# Patient Record
Sex: Male | Born: 1945 | Race: White | Hispanic: No | Marital: Married | Smoking: Former smoker
Health system: Southern US, Community
[De-identification: ages and names within clinical notes are randomized; demographics above are authoritative.]

## PROBLEM LIST (undated history)

## (undated) DIAGNOSIS — Z8709 Personal history of other diseases of the respiratory system: Secondary | ICD-10-CM

## (undated) DIAGNOSIS — K219 Gastro-esophageal reflux disease without esophagitis: Secondary | ICD-10-CM

## (undated) DIAGNOSIS — G4730 Sleep apnea, unspecified: Secondary | ICD-10-CM

## (undated) DIAGNOSIS — E782 Mixed hyperlipidemia: Secondary | ICD-10-CM

## (undated) DIAGNOSIS — IMO0001 Reserved for inherently not codable concepts without codable children: Secondary | ICD-10-CM

## (undated) DIAGNOSIS — I10 Essential (primary) hypertension: Secondary | ICD-10-CM

## (undated) DIAGNOSIS — C801 Malignant (primary) neoplasm, unspecified: Secondary | ICD-10-CM

## (undated) DIAGNOSIS — J449 Chronic obstructive pulmonary disease, unspecified: Secondary | ICD-10-CM

## (undated) DIAGNOSIS — T7840XA Allergy, unspecified, initial encounter: Secondary | ICD-10-CM

## (undated) DIAGNOSIS — J069 Acute upper respiratory infection, unspecified: Secondary | ICD-10-CM

## (undated) DIAGNOSIS — M1990 Unspecified osteoarthritis, unspecified site: Secondary | ICD-10-CM

## (undated) DIAGNOSIS — R351 Nocturia: Secondary | ICD-10-CM

## (undated) DIAGNOSIS — M4800 Spinal stenosis, site unspecified: Secondary | ICD-10-CM

## (undated) HISTORY — DX: Allergy, unspecified, initial encounter: T78.40XA

## (undated) HISTORY — PX: CIRCUMCISION: SUR203

## (undated) HISTORY — PX: SEPTOPLASTY: SUR1290

## (undated) HISTORY — DX: Acute upper respiratory infection, unspecified: J06.9

## (undated) HISTORY — PX: SPINE SURGERY: SHX786

## (undated) HISTORY — DX: Mixed hyperlipidemia: E78.2

## (undated) HISTORY — DX: Gastro-esophageal reflux disease without esophagitis: K21.9

---

## 2002-04-05 ENCOUNTER — Emergency Department (HOSPITAL_COMMUNITY): Admission: EM | Admit: 2002-04-05 | Discharge: 2002-04-05 | Payer: Self-pay | Admitting: Internal Medicine

## 2002-11-09 HISTORY — PX: KNEE ARTHROSCOPY: SUR90

## 2002-11-17 ENCOUNTER — Encounter: Payer: Self-pay | Admitting: Family Medicine

## 2002-11-17 ENCOUNTER — Ambulatory Visit (HOSPITAL_COMMUNITY): Admission: RE | Admit: 2002-11-17 | Discharge: 2002-11-17 | Payer: Self-pay | Admitting: Family Medicine

## 2002-12-01 ENCOUNTER — Ambulatory Visit (HOSPITAL_COMMUNITY): Admission: RE | Admit: 2002-12-01 | Discharge: 2002-12-01 | Payer: Self-pay | Admitting: Family Medicine

## 2002-12-01 ENCOUNTER — Encounter: Payer: Self-pay | Admitting: Family Medicine

## 2002-12-18 ENCOUNTER — Encounter: Payer: Self-pay | Admitting: Otolaryngology

## 2002-12-18 ENCOUNTER — Encounter: Admission: RE | Admit: 2002-12-18 | Discharge: 2002-12-18 | Payer: Self-pay | Admitting: Otolaryngology

## 2003-01-15 ENCOUNTER — Encounter: Payer: Self-pay | Admitting: Otolaryngology

## 2003-01-15 ENCOUNTER — Ambulatory Visit (HOSPITAL_COMMUNITY): Admission: RE | Admit: 2003-01-15 | Discharge: 2003-01-15 | Payer: Self-pay | Admitting: Otolaryngology

## 2003-01-17 ENCOUNTER — Ambulatory Visit (HOSPITAL_COMMUNITY): Admission: RE | Admit: 2003-01-17 | Discharge: 2003-01-17 | Payer: Self-pay | Admitting: Orthopedic Surgery

## 2003-02-22 ENCOUNTER — Ambulatory Visit (HOSPITAL_COMMUNITY): Admission: RE | Admit: 2003-02-22 | Discharge: 2003-02-22 | Payer: Self-pay | Admitting: Otolaryngology

## 2003-02-22 ENCOUNTER — Encounter: Payer: Self-pay | Admitting: Otolaryngology

## 2003-04-10 ENCOUNTER — Encounter: Admission: RE | Admit: 2003-04-10 | Discharge: 2003-04-10 | Payer: Self-pay | Admitting: Otolaryngology

## 2003-04-10 ENCOUNTER — Encounter: Payer: Self-pay | Admitting: Otolaryngology

## 2003-04-16 ENCOUNTER — Encounter (INDEPENDENT_AMBULATORY_CARE_PROVIDER_SITE_OTHER): Payer: Self-pay | Admitting: *Deleted

## 2003-04-16 ENCOUNTER — Ambulatory Visit (HOSPITAL_BASED_OUTPATIENT_CLINIC_OR_DEPARTMENT_OTHER): Admission: RE | Admit: 2003-04-16 | Discharge: 2003-04-16 | Payer: Self-pay | Admitting: Otolaryngology

## 2003-05-09 ENCOUNTER — Ambulatory Visit (HOSPITAL_COMMUNITY): Admission: RE | Admit: 2003-05-09 | Discharge: 2003-05-09 | Payer: Self-pay | Admitting: Family Medicine

## 2003-05-09 ENCOUNTER — Encounter: Payer: Self-pay | Admitting: Family Medicine

## 2004-03-04 ENCOUNTER — Ambulatory Visit (HOSPITAL_COMMUNITY): Admission: RE | Admit: 2004-03-04 | Discharge: 2004-03-04 | Payer: Self-pay | Admitting: Internal Medicine

## 2004-03-04 HISTORY — PX: COLONOSCOPY: SHX174

## 2005-05-18 ENCOUNTER — Emergency Department (HOSPITAL_COMMUNITY): Admission: EM | Admit: 2005-05-18 | Discharge: 2005-05-19 | Payer: Self-pay | Admitting: Emergency Medicine

## 2005-05-20 ENCOUNTER — Emergency Department (HOSPITAL_COMMUNITY): Admission: EM | Admit: 2005-05-20 | Discharge: 2005-05-20 | Payer: Self-pay | Admitting: Emergency Medicine

## 2007-01-18 ENCOUNTER — Ambulatory Visit (HOSPITAL_COMMUNITY): Admission: RE | Admit: 2007-01-18 | Discharge: 2007-01-18 | Payer: Self-pay | Admitting: Family Medicine

## 2008-03-13 ENCOUNTER — Ambulatory Visit (HOSPITAL_COMMUNITY): Admission: RE | Admit: 2008-03-13 | Discharge: 2008-03-13 | Payer: Self-pay | Admitting: Family Medicine

## 2009-05-07 ENCOUNTER — Encounter: Payer: Self-pay | Admitting: Internal Medicine

## 2009-05-22 ENCOUNTER — Ambulatory Visit: Payer: Self-pay | Admitting: Internal Medicine

## 2009-05-22 ENCOUNTER — Ambulatory Visit (HOSPITAL_COMMUNITY): Admission: RE | Admit: 2009-05-22 | Discharge: 2009-05-22 | Payer: Self-pay | Admitting: Internal Medicine

## 2009-05-22 HISTORY — PX: COLONOSCOPY: SHX174

## 2009-11-20 ENCOUNTER — Ambulatory Visit (HOSPITAL_COMMUNITY)
Admission: RE | Admit: 2009-11-20 | Discharge: 2009-11-20 | Payer: Self-pay | Source: Home / Self Care | Admitting: Family Medicine

## 2009-11-29 ENCOUNTER — Ambulatory Visit (HOSPITAL_COMMUNITY): Admission: RE | Admit: 2009-11-29 | Discharge: 2009-11-29 | Payer: Self-pay | Admitting: Family Medicine

## 2009-12-03 ENCOUNTER — Ambulatory Visit (HOSPITAL_COMMUNITY): Admission: RE | Admit: 2009-12-03 | Discharge: 2009-12-03 | Payer: Self-pay | Admitting: Family Medicine

## 2011-01-25 LAB — BLOOD GAS, ARTERIAL
Patient temperature: 37
pH, Arterial: 7.43 (ref 7.350–7.450)
pO2, Arterial: 78.1 mmHg — ABNORMAL LOW (ref 80.0–100.0)

## 2011-03-24 NOTE — Op Note (Signed)
NAME:  Miguel Morales, Miguel Morales                ACCOUNT NO.:  [id number]   MEDICAL RECORD NO.:  [id number]          PATIENT TYPE:  AMB   LOCATION:  DAY                           FACILITY:  [facility]   PHYSICIAN:  R. Roetta Sessions, M.D. DATE OF BIRTH:  Mar 09, 1946   DATE OF PROCEDURE:  05/22/2009  DATE OF DISCHARGE:                               OPERATIVE REPORT   PROCEDURE PERFORMED:  Screening colonoscopy.   INDICATIONS FOR PROCEDURE:  A 65 year old gentleman with a prior history  of colonic adenomas.  His last colonoscopy was 5 years ago.  He was  found to have only left sided diverticula disease at that time.  He has  a history of significant bradycardia during his colonoscopy (session  before last).  He was given prophylactic atropine at his last  colonoscopy and did well and he will receive atropine today prior to  initiation of the procedure.  This approach has been discussed with Mr.  Buffa at length.  Risks, benefits, alternatives, limitations have been  reviewed.  His questions were answered.   PROCEDURE NOTE:  The patient was placed in the left lateral decubitus  position.  O2 saturation, blood pressure, pulse and respirations were  monitored throughout the entire procedure.   CONSCIOUS SEDATION:  Versed 4 mg IV, Demerol 75 mg IV in divided doses.  Atropine 0.25 mg intravenously was given prior to initiation of the  procedure to prophylax against bradycardia.   INSTRUMENT:  Pentax video chip system.   FINDINGS:  Digital rectal exam revealed no abnormalities.   ENDOSCOPIC FINDINGS:  Prep was adequate.  Colon:  Colonic mucosa was  surveyed from the rectosigmoid junction through the left, transverse and  right colon to the appendiceal orifice, ileocecal valve and cecum.  These structures were well seen and photographed for the record.  From  this level the scope was slowly withdrawn.  All previously mentioned  mucosal surfaces were again seen.  Again the patient had scattered left-  sided diverticula.  There were scattered submucosal petechiae around the  opening of some of the sigmoid diverticulum.  The remainder of the  colonic mucosa appeared entirely normal.  Scope was pulled down the  rectum.  Examination of the rectal mucosa including retroflexed view of  the anal verge demonstrated no abnormalities.  The patient tolerated the  procedure very well.  Cecal withdrawal time 9 minutes.   IMPRESSION:  1. Normal rectum.  2. Left-sided diverticula.  3. Colonic mucosa appeared normal.   RECOMMENDATIONS:  Repeat colonoscopy 5 years.      Jonathon Bellows, M.D.  Electronically Signed     RMR/MEDQ  D:  05/22/2009  T:  05/22/2009  Job:  [id number]   cc:   Lorin Picket A. Gerda Diss, MD  Fax: [phone]

## 2011-03-27 NOTE — Op Note (Signed)
NAME:  Miguel Morales, Miguel Morales                          ACCOUNT NO.:  [id number]   MEDICAL RECORD NO.:  [id number]                   PATIENT TYPE:  AMB   LOCATION:  DAY                                  FACILITY:  [facility]   PHYSICIAN:  R. Roetta Sessions, M.D.              DATE OF BIRTH:  Nov 15, 1945   DATE OF PROCEDURE:  03/04/2004  DATE OF DISCHARGE:                                 OPERATIVE REPORT   PROCEDURE:  Surveillance colonoscopy.   INDICATIONS FOR PROCEDURE:  The patient is a 65 year old gentleman who had  multiple adenomas removed from his left colon three years ago.  He has done  well from a GI standpoint, and he is here for surveillance.  This approach  has been discussed with the patient at length.  The potential risks,  benefits, and alternatives have been reviewed and questions answered.  Please see the documentation in the medical record for more information.   PROCEDURE:  O2 saturation, blood pressure, pulses, and respirations were  monitored throughout the entirety of the procedure.  Conscious sedation was  with Versed 4 mg IV, Demerol 75 mg IV in divided doses, atropine 0.25 mg IV  prior to the procedure given his history of bradycardia with a colonoscopy  in 2002.  The instrument used was the Olympus video chip system.   FINDINGS:  Digital rectal examination revealed no abnormalities.   ENDOSCOPIC FINDINGS:  The prep was good.   Rectum:  Examination of the rectal mucosa including retroflex view of the  anal verge revealed no abnormalities.   Colon:  The colonic mucosa was surveyed from the rectosigmoid junction  through the left, transverse, right colon to the area of the appendiceal  orifice, ileocecal valve, and cecum.  These structures were well-seen and  photographed for the record.  From this level, the scope was slowly  withdrawn.  All previously mentioned mucosal surfaces were again seen.  The  patient was noted to have left-sided diverticula.  There was no evidence  of  recurrent polyps or other abnormalities.  The patient tolerated the  procedure well and was reactive in endoscopy.   IMPRESSION:  1. Normal rectum.  2. Left-sided diverticula.  The remainder of the colonic mucosa appeared     normal.   RECOMMENDATIONS:  Repeat colonoscopy in five years.      ___________________________________________                                            Jonathon Bellows, M.D.   RMR/MEDQ  D:  03/04/2004  T:  03/04/2004  Job:  [id number]

## 2011-03-27 NOTE — Op Note (Signed)
NAME:  Miguel Morales, Miguel Morales NO.:  [id number]   MEDICAL RECORD NO.:  [id number]                   PATIENT TYPE:  AMB   LOCATION:  DSC                                  FACILITY:  [facility]   PHYSICIAN:  Karol T. Lazarus Salines, M.D.              DATE OF BIRTH:  Aug 02, 1946   DATE OF PROCEDURE:  04/16/2003  DATE OF DISCHARGE:                                 OPERATIVE REPORT   PREOPERATIVE DIAGNOSES:  1. Chronic polypoid pansinusitis.  2. Deviated nasal septum.  3. Hypertrophic inferior turbinates.   POSTOPERATIVE DIAGNOSES:  1. Chronic polypoid pansinusitis.  2. Deviated nasal septum.  3. Hypertrophic inferior turbinates.   PROCEDURES:  1. Bilateral complete endoscopic ethmoidectomy.  2. Bilateral endoscopic antrostomy.  3. Bilateral endoscopic sphenoidotomy.  4. Nasoseptoplasty.  5. Bilateral submucous resection of inferior turbinates.   SURGEON:  Gloris Manchester. Lazarus Salines, M.D.   ANESTHESIA:  General orotracheal.   BLOOD LOSS:  100 mL.   COMPLICATIONS:  None.   FINDINGS:  Polypoid mucosal changes throughout the middle meatus and sinus  cells on all sides.  An S-shaped septal deformity with a high rightward  deviation and a low leftward deviation.  Hypertrophic inferior turbinates.  No active infection.  No evidence of violation of the fovea ethmoidalis or  the lamina papyracea.   PROCEDURE:  With the patient in the comfortable supine position, general  orotracheal anesthesia was induced without difficulty.  At an appropriate  level, the patient was placed in a slight sitting position.  A saline  moistened throat pack was placed.  Nasal vibrissae were trimmed.  Cocaine  crystals, 200 mg total, were applied on cotton carriers to the anterior  ethmoid and sphenopalatine ganglion regions on both sides.  Xylocaine with  phenylephrine solution was applied on 1/2 x 3 inch Cottonoids to both sides  of the septum.  One percent Xylocaine with 1:100,000 epinephrine, 12  mL  total, was infiltrated into the submucoperichondrial plate into the septum,  into the membranous columella, into the anterior pole of the inferior  turbinates, and into the anterior floor of the nose on both sides.  Several  minutes were allowed for this to take effect.  A sterile preparation and  draping of the mid face was accomplished.   The materials were removed from the nose and observed to be intact and  correct in number.  Additional 1% Xylocaine with 1:100,000 epinephrine on a  spinal needle was infiltrated into the middle turbinate and the lateral wall  of the nose of both sides.  Additional Xylocaine with phenylephrine solution  was applied on Cottonoids up into the middle meatus and between the middle  turbinate and the septum on both sides.  Several additional minutes were  allowed for this to take effect.   The septoplasty was to be performed first.  A left-sided hemitransfixion  incision was sharply executed and carried down to  the caudal edge of the  quadrangular cartilage, which was actually presenting into the right nasal  vestibule.  A small floor incision was executed on both sides and floor  tunnels were elevated, carried back to the vomer, and brought forward.  The  submucoperichondrial plate of the left septum was elevated up to the dorsum  of the nose, back onto the perpendicular plate, brought down and  communicated with the floor tunnel, and brought forward.  The left septal  flap was elevated intact.  The chondroethmoid junction was identified and  opened with the caudal elevator.  The opposite submucoperiosteal plane was  elevated.  The superior perpendicular plate of the ethmoid was lysed with  open Jensen-Middleton forceps to avoid rocking in the cribriform plate  region.  The perpendicular plate was further dissected and rocked free with  closed Jensen-Middleton forceps and delivered.  Additional cartilage tag of  the quadrangular cartilage out along  the vomer was dissected and removed.  A  portion of the posterior vomer, which was deflected rightward was removed.  The posterior inferior corner of the quadrangular cartilage and a 2 mm strip  of the inferior quadrangular cartilage was submucosally resected.  This  allowed the cartilage to trap door into the midline freely.  There was still  a caudal deflection into the right side.  A vertical incision was made along  the deviation and a 0.5 mm wide strip of cartilage was removed to allow the  caudal quadrangular cartilage to hinge.  The hinge was controlled with  interrupted 4-0 chromic sutures in the standard fascia.  The pocket was  generated between the medial curiae of the lower lateral cartilage.  Working  through the external columellar skin, a 4-0 chromic stitch was placed into  the pocket through the septum sagittally and back out of the pocket and  secured at the external columella, tucking the caudal edge of the  quadrangular cartilage into the interior cural pocket.  At this point, the  septum was stable in the midline.  Hemostasis was observed.  The septal  tunnel was suctioned free.  A long posterior left drainage incision was  executed.  The incisions were closed with an interrupted 4-0 chromic suture.   After completing the septoplasty, the right sinus cavity was approached  first.  The Cottonoids were removed and observed to be intact and correct in  number.  The 0 degree endoscope was used to inspect the nose.  There were  polyps in the middle meatus and crowding in the sphenoethmoidal recess.  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  The conchal cell was entered with a sickle knife in the  sagittal plane and the lateral mucosa and bony wall removed with the power  debrider.  The uncinate process was lysed with a sickle knife and removed of  the debrider.  Using the punch forceps to identify the bulla, the bulla  was opened and bullar partitions were removed with the power debrider.  The  vertical lamella of the middle turbinate was punctured.  The posterior  ethmoid cells were entered.  Using suction tip for probing using a straight  punch to identify cells and then using the power debrider to remove polyps,  mucosa, and bone spicules, the ethmoid block was developed.  Early on, the  natural antrostomy was identified and enlarged using the forward and back  biting forceps.  This allowed accurate orientation to the  lamina papyracea,  which was not disturbed.  The lamina papyracea was carried posteriorly.  Working through the nose, a suction tip was placed into the sphenoid ostium  and the sphenoid was opened with the Hajek forceps.  The posterior ethmoid  cells were developed and finally communicated with the sphenoid sinus.  Partitions were carefully removed as was the superior turbinate.  There was  no evidence of violation of the fovea ethmoidalis or the lamina papyracea.  The debrider was switched to an angle tip and the antrostomy was cleaned up.  Using the 30 degree endoscope, agger nasi cells were debrided using punch  forceps and the debrider.  The frontal recess was identified, but no further  disturbed.  At this point, hemostasis was observed.  A Xylocaine with  phenylephrine moistened cottonoid was placed into the middle meatus.   Attention was turned to the left side where the procedure was performed in  essential identical fashion, removing the conchal cell, removing the  anterior and posterior ethmoid cells, communicating the ethmoid block with  the sphenoids and taking down the partitions in between those two and  finally using the angled scope, opening up the agger nasi region and  visualizing the nasofrontal recess.  The antrostomy was large and cleaned  with the debrider.  Again hemostasis was observed.  Both middle turbinates  were slightly floppy at this point and were secured  transseptally with a 4-0  PDS suture.  A Xylocaine with phenylephrine pledget was placed in the left  side.   Returning to the right side, the endoscope was used to inspect the sinus and  no evidence of injury to inappropriate structures or residual cells was  noted.  Hemostasis was observed.  The cottonoid was not replaced.  The  cottonoid was removed from the left side and this side was inspected once  again with the same findings as the right.   At this point, the inferior turbinates were infiltrated with 1% Xylocaine  with 1:100,000 epinephrine, 6 mL total.  Several minutes were allowed for  this to take effect.  Beginning on the right side, the anterior hood of the  inferior turbinate was incised just behind the nasal valve.  The medial  mucosa was incised in an anterior upsloping fashion and a laterally based  flap was developed.  The turbinate was infractured and the turbinate bone  and lateral mucosa were removed using the angled turbinate scissors, taking virtually of the anterior pole and leaving all of the posterior pole.  Bony  spicules were removed.  The cut edges were suction coagulated.  The flap was  laid back down.  The turbinate was out fractured and this side was  completed.  The left side was done in identical fashion.   Reinforced 0.040 Silastic splints were fashion, placed against the septum,  and secured there too with a 3-0 nylon stitch.  Triple thickness Neosporin-  impregnated Telfa packs with a 2-0 silk tag were placed to the ethmoid block  on both sides.  Quadruple thickness Neosporin-impregnated Telfa packs were  placed along the inferior turbinates in the septum low in the nose on both  sides.  Hemostasis was observed.  The strings were tied external to the  columnella.  The pharynx was suctioned free and the throat pack was removed.  At this point, the procedure was completed.  The patient was returned to  anesthesia, awakened, extubated, and transferred  to recovery in stable  condition.   COMMENT:  A 65 year old white male with a chronic history of nasal  obstruction and recurrent sinus infections, as well as nasal obstruction  with the indication for several components of today's procedure.  Anticipate  a routine postoperative recovery with attention to ice, elevation,  analgesia, and antibiosis.  Will remove the septal packs in one day and the  septal splints in 10 days.  Will initiate nasal hygiene measures and observe  routinely for vision difficulties, bleeding, or signs of infection.  Given  low anticipated risk of post anesthetic or post surgical complications, I  feel an outpatient venue is appropriate.                                               Gloris Manchester. Lazarus Salines, M.D.    KTW/MEDQ  D:  04/16/2003  T:  04/16/2003  Job:  [id number]   cc:   Lorin Picket A. Gerda Diss, M.D.  [address], [address]  [place]  [place] [address]  Fax: [phone]

## 2011-11-13 DIAGNOSIS — J069 Acute upper respiratory infection, unspecified: Secondary | ICD-10-CM | POA: Diagnosis not present

## 2011-12-09 DIAGNOSIS — E782 Mixed hyperlipidemia: Secondary | ICD-10-CM | POA: Diagnosis not present

## 2011-12-09 DIAGNOSIS — Z125 Encounter for screening for malignant neoplasm of prostate: Secondary | ICD-10-CM | POA: Diagnosis not present

## 2011-12-09 DIAGNOSIS — Z79899 Other long term (current) drug therapy: Secondary | ICD-10-CM | POA: Diagnosis not present

## 2011-12-14 DIAGNOSIS — Z0000 Encounter for general adult medical examination without abnormal findings: Secondary | ICD-10-CM | POA: Diagnosis not present

## 2011-12-14 DIAGNOSIS — Z23 Encounter for immunization: Secondary | ICD-10-CM | POA: Diagnosis not present

## 2011-12-28 ENCOUNTER — Encounter (HOSPITAL_COMMUNITY): Payer: Self-pay

## 2011-12-28 ENCOUNTER — Emergency Department (HOSPITAL_COMMUNITY)
Admission: EM | Admit: 2011-12-28 | Discharge: 2011-12-28 | Disposition: A | Discharge status: Other Acute Inpatient Hospital | Payer: Medicare Other | Attending: Emergency Medicine | Admitting: Emergency Medicine

## 2011-12-28 DIAGNOSIS — Z87891 Personal history of nicotine dependence: Secondary | ICD-10-CM | POA: Diagnosis not present

## 2011-12-28 DIAGNOSIS — T310 Burns involving less than 10% of body surface: Secondary | ICD-10-CM | POA: Diagnosis not present

## 2011-12-28 DIAGNOSIS — X028XXA Other exposure to controlled fire in building or structure, initial encounter: Secondary | ICD-10-CM | POA: Insufficient documentation

## 2011-12-28 DIAGNOSIS — J449 Chronic obstructive pulmonary disease, unspecified: Secondary | ICD-10-CM | POA: Insufficient documentation

## 2011-12-28 DIAGNOSIS — T300 Burn of unspecified body region, unspecified degree: Secondary | ICD-10-CM | POA: Insufficient documentation

## 2011-12-28 DIAGNOSIS — J4489 Other specified chronic obstructive pulmonary disease: Secondary | ICD-10-CM | POA: Insufficient documentation

## 2011-12-28 DIAGNOSIS — T3110 Burns involving 10-19% of body surface with 0% to 9% third degree burns: Secondary | ICD-10-CM | POA: Insufficient documentation

## 2011-12-28 DIAGNOSIS — T2020XA Burn of second degree of head, face, and neck, unspecified site, initial encounter: Secondary | ICD-10-CM | POA: Diagnosis not present

## 2011-12-28 DIAGNOSIS — T3111 Burns involving 10-19% of body surface with 10-19% third degree burns: Secondary | ICD-10-CM | POA: Diagnosis not present

## 2011-12-28 DIAGNOSIS — T2121XA Burn of second degree of chest wall, initial encounter: Secondary | ICD-10-CM | POA: Diagnosis not present

## 2011-12-28 DIAGNOSIS — Y9229 Other specified public building as the place of occurrence of the external cause: Secondary | ICD-10-CM | POA: Insufficient documentation

## 2011-12-28 DIAGNOSIS — T2000XA Burn of unspecified degree of head, face, and neck, unspecified site, initial encounter: Secondary | ICD-10-CM

## 2011-12-28 DIAGNOSIS — T22219A Burn of second degree of unspecified forearm, initial encounter: Secondary | ICD-10-CM | POA: Diagnosis not present

## 2011-12-28 HISTORY — DX: Chronic obstructive pulmonary disease, unspecified: J44.9

## 2011-12-28 MED ORDER — LACTATED RINGERS IV SOLN
INTRAVENOUS | Status: DC
  Administered 2011-12-28: 13:00:00 via INTRAVENOUS

## 2011-12-28 NOTE — ED Notes (Signed)
Patient sitting in bed. Remains alert/oriented x4. Respirations even and unlabored.
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
## 2011-12-28 NOTE — ED Notes (Signed)
Report given to Bjorn Loser, RN at [facility] ED. Informed of [facility] ETA. They are awaiting patient arrival.

## 2011-12-28 NOTE — ED Provider Notes (Signed)
I saw and evaluated the patient, reviewed the resident's note and I agree with the findings and plan.  Delani Kohli P Haevyn Ury, MD 12/28/11 1607 

## 2011-12-28 NOTE — ED Notes (Signed)
Report given to Casimiro Needle, RN [facility]. [facility] at bedside.

## 2011-12-28 NOTE — ED Notes (Signed)
Patient transferred to [facility]. Departure destination charted incorrectly (charted patient with home destination).
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
## 2011-12-28 NOTE — ED Notes (Signed)
Patient left with [facility] at this time. NAD noted upon departure.
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
## 2012-01-04 DIAGNOSIS — M1990 Unspecified osteoarthritis, unspecified site: Secondary | ICD-10-CM | POA: Diagnosis not present

## 2012-01-04 DIAGNOSIS — L0390 Cellulitis, unspecified: Secondary | ICD-10-CM | POA: Diagnosis not present

## 2012-01-04 DIAGNOSIS — J410 Simple chronic bronchitis: Secondary | ICD-10-CM | POA: Diagnosis not present

## 2012-01-04 DIAGNOSIS — I110 Hypertensive heart disease with heart failure: Secondary | ICD-10-CM | POA: Diagnosis not present

## 2012-01-04 DIAGNOSIS — L0291 Cutaneous abscess, unspecified: Secondary | ICD-10-CM | POA: Diagnosis not present

## 2012-01-18 DIAGNOSIS — T310 Burns involving less than 10% of body surface: Secondary | ICD-10-CM | POA: Diagnosis not present

## 2012-03-22 ENCOUNTER — Other Ambulatory Visit: Payer: Self-pay | Admitting: Family Medicine

## 2012-03-22 ENCOUNTER — Ambulatory Visit (HOSPITAL_COMMUNITY)
Admission: RE | Admit: 2012-03-22 | Discharge: 2012-03-22 | Disposition: A | Discharge status: Home / Self Care | Payer: Medicare Other | Source: Ambulatory Visit | Attending: Family Medicine | Admitting: Family Medicine

## 2012-03-22 ENCOUNTER — Encounter: Payer: Self-pay | Admitting: Cardiology

## 2012-03-22 DIAGNOSIS — R05 Cough: Secondary | ICD-10-CM | POA: Diagnosis not present

## 2012-03-22 DIAGNOSIS — J4489 Other specified chronic obstructive pulmonary disease: Secondary | ICD-10-CM | POA: Insufficient documentation

## 2012-03-22 DIAGNOSIS — R0602 Shortness of breath: Secondary | ICD-10-CM | POA: Diagnosis not present

## 2012-03-22 DIAGNOSIS — J449 Chronic obstructive pulmonary disease, unspecified: Secondary | ICD-10-CM | POA: Insufficient documentation

## 2012-03-22 DIAGNOSIS — R0609 Other forms of dyspnea: Secondary | ICD-10-CM | POA: Diagnosis not present

## 2012-03-22 DIAGNOSIS — R0989 Other specified symptoms and signs involving the circulatory and respiratory systems: Secondary | ICD-10-CM | POA: Diagnosis not present

## 2012-03-23 ENCOUNTER — Ambulatory Visit (INDEPENDENT_AMBULATORY_CARE_PROVIDER_SITE_OTHER): Payer: Medicare Other | Admitting: Cardiology

## 2012-03-23 ENCOUNTER — Encounter: Payer: Self-pay | Admitting: Cardiology

## 2012-03-23 VITALS — BP 124/76 | HR 86 | Resp 16 | Ht 67.0 in | Wt 201.0 lb

## 2012-03-23 DIAGNOSIS — J449 Chronic obstructive pulmonary disease, unspecified: Secondary | ICD-10-CM

## 2012-03-23 DIAGNOSIS — R0602 Shortness of breath: Secondary | ICD-10-CM | POA: Diagnosis not present

## 2012-03-23 DIAGNOSIS — E782 Mixed hyperlipidemia: Secondary | ICD-10-CM | POA: Insufficient documentation

## 2012-03-23 NOTE — Assessment & Plan Note (Signed)
Description seems most consistent with pulmonary etiology. Hopefully he will feel better with some of the recent modification in his inhaler regimen by Dr.Luking. His ECG is nonspecific, although he does have cardiac risk factors otherwise including gender and age, hyperlipidemia. He has not had any recent ischemic assessment, and this will be obtained via exercise Myoview. If he is not able to ambulate, would change test to a dobutamine Myoview.

## 2012-03-23 NOTE — Patient Instructions (Signed)
**Note De-identified Macon Lesesne Obfuscation** Your physician recommends that you continue on your current medications as directed. Please refer to the Current Medication list given to you today.  Your physician has requested that you have en exercise stress myoview. For further information please visit [email or web address]. Please follow instruction sheet, as given.  Your physician recommends that you schedule a follow-up appointment in: we will contact you with results of test.  

## 2012-03-23 NOTE — Progress Notes (Signed)
Clinical Summary Miguel Morales is a 66 y.o.male referred for cardiology consultation by Dr. Lilyan Punt. He reports worsening in baseline shortness of breath with activity over the last 6 weeks. Indicates a dry mouth, mouth breathing when he exerts himself, also feeling of irritation in his throat with coughing spells. He does use inhalers regularly for COPD. He has had no exertional chest pain or palpitations. Reports a standard treadmill test approximately 10-15 years ago.  Recent chest x-ray from 5/14 reports COPD with no active lung disease. Fax copy of recent ECG shows sinus rhythm with nonspecific ST changes.   Allergies  Allergen Reactions  . Sulfa Antibiotics     Current Outpatient Prescriptions  Medication Sig Dispense Refill  . albuterol (PROVENTIL HFA;VENTOLIN HFA) 108 (90 BASE) MCG/ACT inhaler Inhale 2 puffs into the lungs every 6 (six) hours as needed.      . Ascorbic Acid (VITAMIN C) 1000 MG tablet Take 1,000 mg by mouth daily.      Marland Kitchen aspirin 81 MG tablet Take 81 mg by mouth daily.      . budesonide-formoterol (SYMBICORT) 160-4.5 MCG/ACT inhaler Inhale 2 puffs into the lungs 2 (two) times daily.      . Loratadine 10 MG CAPS Take by mouth daily.      . meloxicam (MOBIC) 7.5 MG tablet Take 7.5 mg by mouth daily.      Marland Kitchen omeprazole (PRILOSEC) 20 MG capsule Take 20 mg by mouth daily.      Marland Kitchen tiotropium (SPIRIVA) 18 MCG inhalation capsule Place 18 mcg into inhaler and inhale daily.        Past Medical History  Diagnosis Date  . COPD (chronic obstructive pulmonary disease)   . Mixed hyperlipidemia   . GERD (gastroesophageal reflux disease)     Past Surgical History  Procedure Date  . Knee arthroscopy   . Septoplasty   . Circumcision     Family History  Problem Relation Age of Onset  . Cancer Mother     Social History [name] reports that he has quit smoking. His smoking use included Cigarettes. He has never used smokeless tobacco. Mr. Canterbury reports that he  drinks alcohol.  Review of Systems No hemoptysis, no fevers or chills. Stable appetite. Has chronic problems with pruritus, states that he bleeds easily, has chronic right knee pain. No falls. Otherwise reviewed and negative except as outlined.  Physical Examination Filed Vitals:   03/23/12 1415  BP: 124/76  Pulse: 86  Resp: 16   Overweight male in no acute distress. HEENT: Conjunctiva and lids normal, oropharynx clear. Neck: Supple, no elevated JVP or carotid bruits, no thyromegaly. Lungs: Diminished but clear to auscultation, nonlabored breathing at rest. Cardiac: Regular rate and rhythm, no S3 or significant systolic murmur, no pericardial rub. Abdomen: Soft, nontender, bowel sounds present, no guarding or rebound. Extremities: No pitting edema, distal pulses 2+. Skin: Warm and dry. Musculoskeletal: No kyphosis. Neuropsychiatric: Alert and oriented x3, affect grossly appropriate.   Problem List and Plan   Shortness of breath Description seems most consistent with pulmonary etiology. Hopefully he will feel better with some of the recent modification in his inhaler regimen by Dr.Luking. His ECG is nonspecific, although he does have cardiac risk factors otherwise including gender and age, hyperlipidemia. He has not had any recent ischemic assessment, and this will be obtained via exercise Myoview. If he is not able to ambulate, would change test to a dobutamine Myoview.  Mixed hyperlipidemia Followed by Dr. Gerda Diss.  COPD (  chronic obstructive pulmonary disease) Quit smoking approximately 3 years ago. Chest x-ray without acute findings. No active wheezing today.     Jonelle Sidle, M.D., F.A.C.C.

## 2012-03-23 NOTE — Assessment & Plan Note (Signed)
Followed by Dr. Luking 

## 2012-03-23 NOTE — Assessment & Plan Note (Signed)
Quit smoking approximately 3 years ago. Chest x-ray without acute findings. No active wheezing today.

## 2012-04-01 ENCOUNTER — Ambulatory Visit (INDEPENDENT_AMBULATORY_CARE_PROVIDER_SITE_OTHER): Payer: Medicare Other

## 2012-04-01 ENCOUNTER — Encounter (HOSPITAL_COMMUNITY)
Admission: RE | Admit: 2012-04-01 | Discharge: 2012-04-01 | Disposition: A | Discharge status: Home / Self Care | Payer: Medicare Other | Source: Ambulatory Visit | Attending: Cardiology | Admitting: Cardiology

## 2012-04-01 ENCOUNTER — Encounter (HOSPITAL_COMMUNITY): Payer: Self-pay

## 2012-04-01 DIAGNOSIS — J4489 Other specified chronic obstructive pulmonary disease: Secondary | ICD-10-CM | POA: Insufficient documentation

## 2012-04-01 DIAGNOSIS — J449 Chronic obstructive pulmonary disease, unspecified: Secondary | ICD-10-CM | POA: Insufficient documentation

## 2012-04-01 DIAGNOSIS — K219 Gastro-esophageal reflux disease without esophagitis: Secondary | ICD-10-CM | POA: Insufficient documentation

## 2012-04-01 DIAGNOSIS — R0602 Shortness of breath: Secondary | ICD-10-CM | POA: Insufficient documentation

## 2012-04-01 DIAGNOSIS — E785 Hyperlipidemia, unspecified: Secondary | ICD-10-CM | POA: Insufficient documentation

## 2012-04-01 HISTORY — DX: Malignant (primary) neoplasm, unspecified: C80.1

## 2012-04-01 MED ORDER — TECHNETIUM TC 99M TETROFOSMIN IV KIT
30.0000 | PACK | Freq: Once | INTRAVENOUS | Status: AC | PRN
  Administered 2012-04-01: 29.7 via INTRAVENOUS

## 2012-04-01 MED ORDER — TECHNETIUM TC 99M TETROFOSMIN IV KIT
10.0000 | PACK | Freq: Once | INTRAVENOUS | Status: AC | PRN
Start: 2012-04-01 — End: 2012-04-01
  Administered 2012-04-01: 10 via INTRAVENOUS

## 2012-04-01 NOTE — Progress Notes (Addendum)
Stress Lab Nurses Notes - Miguel Morales  Miguel Morales 04/01/2012  Reason for doing test: Chest Pain  Type of test: Stress Myoview  Nurse performing test: Marlena Clipper, RN  Nuclear Medicine Tech: Lyndel Pleasure  Echo Tech: Not Applicable  MD performing test: Joni Reining NP  Family MD: Lilyan Punt  Test explained and consent signed: yes  IV started: 22g jelco, Saline lock flushed, No redness or edema and Saline lock started in radiology  Symptoms: Short of breath  Treatment/Intervention: None  Reason test stopped: reached target HR and SOB  After recovery IV was: Discontinued via X-ray tech  Patient to return to Nuc. Med at :11:45  Patient discharged: Home  Patient's Condition upon discharge was: stable  Comments: Patient walked for 8:08 minutes reaching a MAX METS of 10.30. Resting HR was 68 and resting BP was 120/80. His peak HR was 146 and His peak BP was 160/88.   Angelica Pou

## 2012-04-23 DIAGNOSIS — J019 Acute sinusitis, unspecified: Secondary | ICD-10-CM | POA: Diagnosis not present

## 2012-05-02 ENCOUNTER — Encounter (HOSPITAL_COMMUNITY): Payer: Self-pay | Admitting: Cardiology

## 2012-07-15 DIAGNOSIS — J019 Acute sinusitis, unspecified: Secondary | ICD-10-CM | POA: Diagnosis not present

## 2012-07-15 DIAGNOSIS — J310 Chronic rhinitis: Secondary | ICD-10-CM | POA: Diagnosis not present

## 2012-07-25 DIAGNOSIS — Z79899 Other long term (current) drug therapy: Secondary | ICD-10-CM | POA: Diagnosis not present

## 2012-07-25 DIAGNOSIS — E782 Mixed hyperlipidemia: Secondary | ICD-10-CM | POA: Diagnosis not present

## 2012-08-10 ENCOUNTER — Other Ambulatory Visit: Payer: Self-pay | Admitting: Dermatology

## 2012-08-10 DIAGNOSIS — C4441 Basal cell carcinoma of skin of scalp and neck: Secondary | ICD-10-CM | POA: Diagnosis not present

## 2012-08-10 DIAGNOSIS — C44519 Basal cell carcinoma of skin of other part of trunk: Secondary | ICD-10-CM | POA: Diagnosis not present

## 2012-09-15 DIAGNOSIS — C4441 Basal cell carcinoma of skin of scalp and neck: Secondary | ICD-10-CM | POA: Diagnosis not present

## 2012-09-15 DIAGNOSIS — L570 Actinic keratosis: Secondary | ICD-10-CM | POA: Diagnosis not present

## 2012-10-19 DIAGNOSIS — J322 Chronic ethmoidal sinusitis: Secondary | ICD-10-CM | POA: Diagnosis not present

## 2012-10-28 DIAGNOSIS — E782 Mixed hyperlipidemia: Secondary | ICD-10-CM | POA: Diagnosis not present

## 2012-10-28 DIAGNOSIS — Z79899 Other long term (current) drug therapy: Secondary | ICD-10-CM | POA: Diagnosis not present

## 2012-12-09 DIAGNOSIS — Z125 Encounter for screening for malignant neoplasm of prostate: Secondary | ICD-10-CM | POA: Diagnosis not present

## 2012-12-09 DIAGNOSIS — Z79899 Other long term (current) drug therapy: Secondary | ICD-10-CM | POA: Diagnosis not present

## 2012-12-09 DIAGNOSIS — E782 Mixed hyperlipidemia: Secondary | ICD-10-CM | POA: Diagnosis not present

## 2012-12-13 DIAGNOSIS — L259 Unspecified contact dermatitis, unspecified cause: Secondary | ICD-10-CM | POA: Diagnosis not present

## 2012-12-14 DIAGNOSIS — Z79899 Other long term (current) drug therapy: Secondary | ICD-10-CM | POA: Diagnosis not present

## 2012-12-14 DIAGNOSIS — Z0000 Encounter for general adult medical examination without abnormal findings: Secondary | ICD-10-CM | POA: Diagnosis not present

## 2013-03-08 DIAGNOSIS — M5030 Other cervical disc degeneration, unspecified cervical region: Secondary | ICD-10-CM | POA: Diagnosis not present

## 2013-03-08 DIAGNOSIS — M9981 Other biomechanical lesions of cervical region: Secondary | ICD-10-CM | POA: Diagnosis not present

## 2013-03-10 DIAGNOSIS — M9981 Other biomechanical lesions of cervical region: Secondary | ICD-10-CM | POA: Diagnosis not present

## 2013-03-10 DIAGNOSIS — M5030 Other cervical disc degeneration, unspecified cervical region: Secondary | ICD-10-CM | POA: Diagnosis not present

## 2013-03-13 DIAGNOSIS — M5030 Other cervical disc degeneration, unspecified cervical region: Secondary | ICD-10-CM | POA: Diagnosis not present

## 2013-03-13 DIAGNOSIS — M9981 Other biomechanical lesions of cervical region: Secondary | ICD-10-CM | POA: Diagnosis not present

## 2013-03-15 DIAGNOSIS — M5030 Other cervical disc degeneration, unspecified cervical region: Secondary | ICD-10-CM | POA: Diagnosis not present

## 2013-03-15 DIAGNOSIS — M9981 Other biomechanical lesions of cervical region: Secondary | ICD-10-CM | POA: Diagnosis not present

## 2013-03-17 DIAGNOSIS — M9981 Other biomechanical lesions of cervical region: Secondary | ICD-10-CM | POA: Diagnosis not present

## 2013-03-17 DIAGNOSIS — M5030 Other cervical disc degeneration, unspecified cervical region: Secondary | ICD-10-CM | POA: Diagnosis not present

## 2013-03-21 DIAGNOSIS — M9981 Other biomechanical lesions of cervical region: Secondary | ICD-10-CM | POA: Diagnosis not present

## 2013-03-21 DIAGNOSIS — M5030 Other cervical disc degeneration, unspecified cervical region: Secondary | ICD-10-CM | POA: Diagnosis not present

## 2013-03-29 DIAGNOSIS — M5030 Other cervical disc degeneration, unspecified cervical region: Secondary | ICD-10-CM | POA: Diagnosis not present

## 2013-03-29 DIAGNOSIS — M9981 Other biomechanical lesions of cervical region: Secondary | ICD-10-CM | POA: Diagnosis not present

## 2013-03-30 ENCOUNTER — Telehealth: Payer: Self-pay | Admitting: Family Medicine

## 2013-03-30 DIAGNOSIS — R7301 Impaired fasting glucose: Secondary | ICD-10-CM

## 2013-03-30 DIAGNOSIS — E785 Hyperlipidemia, unspecified: Secondary | ICD-10-CM

## 2013-03-30 NOTE — Telephone Encounter (Signed)
Patient has an appointment scheduled for Monday May 01, 2013 at 9:00am.  Would like for blood work paperwork to be mailed to his home before this date.  Please call patient to let him know this paperwork has been mailed.

## 2013-04-04 DIAGNOSIS — M9981 Other biomechanical lesions of cervical region: Secondary | ICD-10-CM | POA: Diagnosis not present

## 2013-04-04 DIAGNOSIS — R7301 Impaired fasting glucose: Secondary | ICD-10-CM | POA: Diagnosis not present

## 2013-04-04 DIAGNOSIS — M5030 Other cervical disc degeneration, unspecified cervical region: Secondary | ICD-10-CM | POA: Diagnosis not present

## 2013-04-04 DIAGNOSIS — E785 Hyperlipidemia, unspecified: Secondary | ICD-10-CM | POA: Diagnosis not present

## 2013-04-04 NOTE — Telephone Encounter (Signed)
bloodwork papers ready and put in the mail. Pt notified

## 2013-04-04 NOTE — Telephone Encounter (Signed)
Lipid/ HgA1C

## 2013-04-12 DIAGNOSIS — M5030 Other cervical disc degeneration, unspecified cervical region: Secondary | ICD-10-CM | POA: Diagnosis not present

## 2013-04-12 DIAGNOSIS — M9981 Other biomechanical lesions of cervical region: Secondary | ICD-10-CM | POA: Diagnosis not present

## 2013-04-19 DIAGNOSIS — M5030 Other cervical disc degeneration, unspecified cervical region: Secondary | ICD-10-CM | POA: Diagnosis not present

## 2013-04-19 DIAGNOSIS — E785 Hyperlipidemia, unspecified: Secondary | ICD-10-CM | POA: Diagnosis not present

## 2013-04-19 DIAGNOSIS — R7301 Impaired fasting glucose: Secondary | ICD-10-CM | POA: Diagnosis not present

## 2013-04-19 DIAGNOSIS — M9981 Other biomechanical lesions of cervical region: Secondary | ICD-10-CM | POA: Diagnosis not present

## 2013-04-19 LAB — LIPID PANEL
Cholesterol: 220 mg/dL — ABNORMAL HIGH (ref 0–200)
LDL Cholesterol: 155 mg/dL — ABNORMAL HIGH (ref 0–99)
Total CHOL/HDL Ratio: 6.5 Ratio
Triglycerides: 153 mg/dL — ABNORMAL HIGH (ref <150)
VLDL: 31 mg/dL (ref 0–40)

## 2013-04-19 LAB — HEMOGLOBIN A1C: Mean Plasma Glucose: 123 mg/dL — ABNORMAL HIGH (ref <117)

## 2013-04-26 ENCOUNTER — Encounter: Payer: Self-pay | Admitting: *Deleted

## 2013-04-26 DIAGNOSIS — M9981 Other biomechanical lesions of cervical region: Secondary | ICD-10-CM | POA: Diagnosis not present

## 2013-04-26 DIAGNOSIS — M5030 Other cervical disc degeneration, unspecified cervical region: Secondary | ICD-10-CM | POA: Diagnosis not present

## 2013-05-01 ENCOUNTER — Ambulatory Visit (INDEPENDENT_AMBULATORY_CARE_PROVIDER_SITE_OTHER): Payer: Medicare Other | Admitting: Family Medicine

## 2013-05-01 ENCOUNTER — Encounter: Payer: Self-pay | Admitting: Family Medicine

## 2013-05-01 VITALS — BP 122/80 | HR 70 | Ht 65.5 in | Wt 211.1 lb

## 2013-05-01 DIAGNOSIS — E782 Mixed hyperlipidemia: Secondary | ICD-10-CM

## 2013-05-01 NOTE — Patient Instructions (Signed)
DASH Diet  The DASH diet stands for "Dietary Approaches to Stop Hypertension." It is a healthy eating plan that has been shown to reduce high blood pressure (hypertension) in as little as 14 days, while also possibly providing other significant health benefits. These other health benefits include reducing the risk of breast cancer after menopause and reducing the risk of type 2 diabetes, heart disease, colon cancer, and stroke. Health benefits also include weight loss and slowing kidney failure in patients with chronic kidney disease.   DIET GUIDELINES  · Limit salt (sodium). Your diet should contain less than 1500 mg of sodium daily.  · Limit refined or processed carbohydrates. Your diet should include mostly whole grains. Desserts and added sugars should be used sparingly.  · Include small amounts of heart-healthy fats. These types of fats include nuts, oils, and tub margarine. Limit saturated and trans fats. These fats have been shown to be harmful in the body.  CHOOSING FOODS   The following food groups are based on a 2000 calorie diet. See your Registered Dietitian for individual calorie needs.  Grains and Grain Products (6 to 8 servings daily)  · Eat More Often: Whole-wheat bread, brown rice, whole-grain or wheat pasta, quinoa, popcorn without added fat or salt (air popped).  · Eat Less Often: White bread, white pasta, white rice, cornbread.  Vegetables (4 to 5 servings daily)  · Eat More Often: Fresh, frozen, and canned vegetables. Vegetables may be raw, steamed, roasted, or grilled with a minimal amount of fat.  · Eat Less Often/Avoid: Creamed or fried vegetables. Vegetables in a cheese sauce.  Fruit (4 to 5 servings daily)  · Eat More Often: All fresh, canned (in natural juice), or frozen fruits. Dried fruits without added sugar. One hundred percent fruit juice (½ cup [237 mL] daily).  · Eat Less Often: Dried fruits with added sugar. Canned fruit in light or heavy syrup.  Lean Meats, Fish, and Poultry (2  servings or less daily. One serving is 3 to 4 oz [85-114 g]).  · Eat More Often: Ninety percent or leaner ground beef, tenderloin, sirloin. Round cuts of beef, chicken breast, turkey breast. All fish. Grill, bake, or broil your meat. Nothing should be fried.  · Eat Less Often/Avoid: Fatty cuts of meat, turkey, or chicken leg, thigh, or wing. Fried cuts of meat or fish.  Dairy (2 to 3 servings)  · Eat More Often: Low-fat or fat-free milk, low-fat plain or light yogurt, reduced-fat or part-skim cheese.  · Eat Less Often/Avoid: Milk (whole, 2%). Whole milk yogurt. Full-fat cheeses.  Nuts, Seeds, and Legumes (4 to 5 servings per week)  · Eat More Often: All without added salt.  · Eat Less Often/Avoid: Salted nuts and seeds, canned beans with added salt.  Fats and Sweets (limited)  · Eat More Often: Vegetable oils, tub margarines without trans fats, sugar-free gelatin. Mayonnaise and salad dressings.  · Eat Less Often/Avoid: Coconut oils, palm oils, butter, stick margarine, cream, half and half, cookies, candy, pie.  FOR MORE INFORMATION  The Dash Diet Eating Plan: www.dashdiet.org  Document Released: 10/15/2011 Document Revised: 01/18/2012 Document Reviewed: 10/15/2011  ExitCare® Patient Information ©2014 [employer].

## 2013-05-01 NOTE — Progress Notes (Signed)
  Subjective:    Patient ID: Miguel Morales, male    DOB: [birth year], 67 y.o.   MRN: [id number]  Hyperlipidemia This is a chronic problem. The current episode started more than 1 year ago. The problem is controlled. There are no known factors aggravating his hyperlipidemia. Pertinent negatives include no chest pain. Current antihyperlipidemic treatment includes statins. The current treatment provides mild improvement of lipids. There are no compliance problems.  There are no known risk factors for coronary artery disease.    this patient has tried pravastatin, simvastatin, Lipitor all 3 cause significant muscle pain as well as joint discomforts. He has not been able to tolerate these medications. He has stopped them. He is now using Chia seed. He has not tried red rice extract. PMH hyperlipidemia COPD he feels his COPD is stable. He does get short of breath when he pushes himself. Family history noncontributory   Review of Systems  Constitutional: Negative for fever and activity change.  HENT: Negative for ear pain, congestion and rhinorrhea.   Eyes: Negative for discharge.  Respiratory: Negative for cough and wheezing.   Cardiovascular: Negative for chest pain.       Objective:   Physical Exam  Nursing note and vitals reviewed. Constitutional: He appears well-developed.  HENT:  Head: Normocephalic.  Mouth/Throat: Oropharynx is clear and moist. No oropharyngeal exudate.  Neck: Normal range of motion.  Cardiovascular: Normal rate, regular rhythm and normal heart sounds.   No murmur heard. Pulmonary/Chest: Effort normal and breath sounds normal. He has no wheezes.  Lymphadenopathy:    He has no cervical adenopathy.  Neurological: He exhibits normal muscle tone.  Skin: Skin is warm and dry.          Assessment & Plan:  #1 COPD-stable continue current measures #2 hyperlipidemia try red rice yeast extract really look at lab work later this year. Healthy eating encourage regular  activity reduction and weight would be beneficial as well.

## 2013-05-03 DIAGNOSIS — F4310 Post-traumatic stress disorder, unspecified: Secondary | ICD-10-CM | POA: Diagnosis not present

## 2013-05-03 DIAGNOSIS — M5030 Other cervical disc degeneration, unspecified cervical region: Secondary | ICD-10-CM | POA: Diagnosis not present

## 2013-05-03 DIAGNOSIS — M9981 Other biomechanical lesions of cervical region: Secondary | ICD-10-CM | POA: Diagnosis not present

## 2013-05-03 DIAGNOSIS — E785 Hyperlipidemia, unspecified: Secondary | ICD-10-CM | POA: Diagnosis not present

## 2013-05-03 DIAGNOSIS — M25569 Pain in unspecified knee: Secondary | ICD-10-CM | POA: Diagnosis not present

## 2013-05-03 DIAGNOSIS — R7309 Other abnormal glucose: Secondary | ICD-10-CM | POA: Diagnosis not present

## 2013-05-16 DIAGNOSIS — M9981 Other biomechanical lesions of cervical region: Secondary | ICD-10-CM | POA: Diagnosis not present

## 2013-05-16 DIAGNOSIS — M5030 Other cervical disc degeneration, unspecified cervical region: Secondary | ICD-10-CM | POA: Diagnosis not present

## 2013-05-24 DIAGNOSIS — M9981 Other biomechanical lesions of cervical region: Secondary | ICD-10-CM | POA: Diagnosis not present

## 2013-05-24 DIAGNOSIS — M5030 Other cervical disc degeneration, unspecified cervical region: Secondary | ICD-10-CM | POA: Diagnosis not present

## 2013-05-26 DIAGNOSIS — M25559 Pain in unspecified hip: Secondary | ICD-10-CM | POA: Diagnosis not present

## 2013-05-26 DIAGNOSIS — F4310 Post-traumatic stress disorder, unspecified: Secondary | ICD-10-CM | POA: Diagnosis not present

## 2013-05-26 DIAGNOSIS — M25569 Pain in unspecified knee: Secondary | ICD-10-CM | POA: Diagnosis not present

## 2013-05-27 ENCOUNTER — Other Ambulatory Visit: Payer: Self-pay | Admitting: Family Medicine

## 2013-06-20 ENCOUNTER — Encounter: Payer: Self-pay | Admitting: Orthopedic Surgery

## 2013-06-20 ENCOUNTER — Ambulatory Visit (HOSPITAL_COMMUNITY)
Admission: RE | Admit: 2013-06-20 | Discharge: 2013-06-20 | Disposition: A | Discharge status: Home / Self Care | Payer: Medicare Other | Source: Ambulatory Visit | Attending: Orthopedic Surgery | Admitting: Orthopedic Surgery

## 2013-06-20 ENCOUNTER — Ambulatory Visit (INDEPENDENT_AMBULATORY_CARE_PROVIDER_SITE_OTHER): Payer: Medicare Other | Admitting: Orthopedic Surgery

## 2013-06-20 ENCOUNTER — Other Ambulatory Visit: Payer: Self-pay | Admitting: Orthopedic Surgery

## 2013-06-20 VITALS — BP 140/84 | Ht 66.0 in | Wt 206.0 lb

## 2013-06-20 DIAGNOSIS — M25552 Pain in left hip: Secondary | ICD-10-CM

## 2013-06-20 DIAGNOSIS — Z9010 Acquired absence of unspecified breast and nipple: Secondary | ICD-10-CM | POA: Diagnosis not present

## 2013-06-20 DIAGNOSIS — M25559 Pain in unspecified hip: Secondary | ICD-10-CM | POA: Diagnosis not present

## 2013-06-20 DIAGNOSIS — M48062 Spinal stenosis, lumbar region with neurogenic claudication: Secondary | ICD-10-CM | POA: Insufficient documentation

## 2013-06-20 IMAGING — CR DG HIP (WITH OR WITHOUT PELVIS) 2-3V*L*
3 series · 3 of 3 positions shown · non-contrast
Comparison: Prior radiographs of the pelvis and left hip [DATE]

CLINICAL DATA: Left hip pain

LEFT HIP - COMPLETE 2+ VIEW

[view not recorded (1 of 3)]
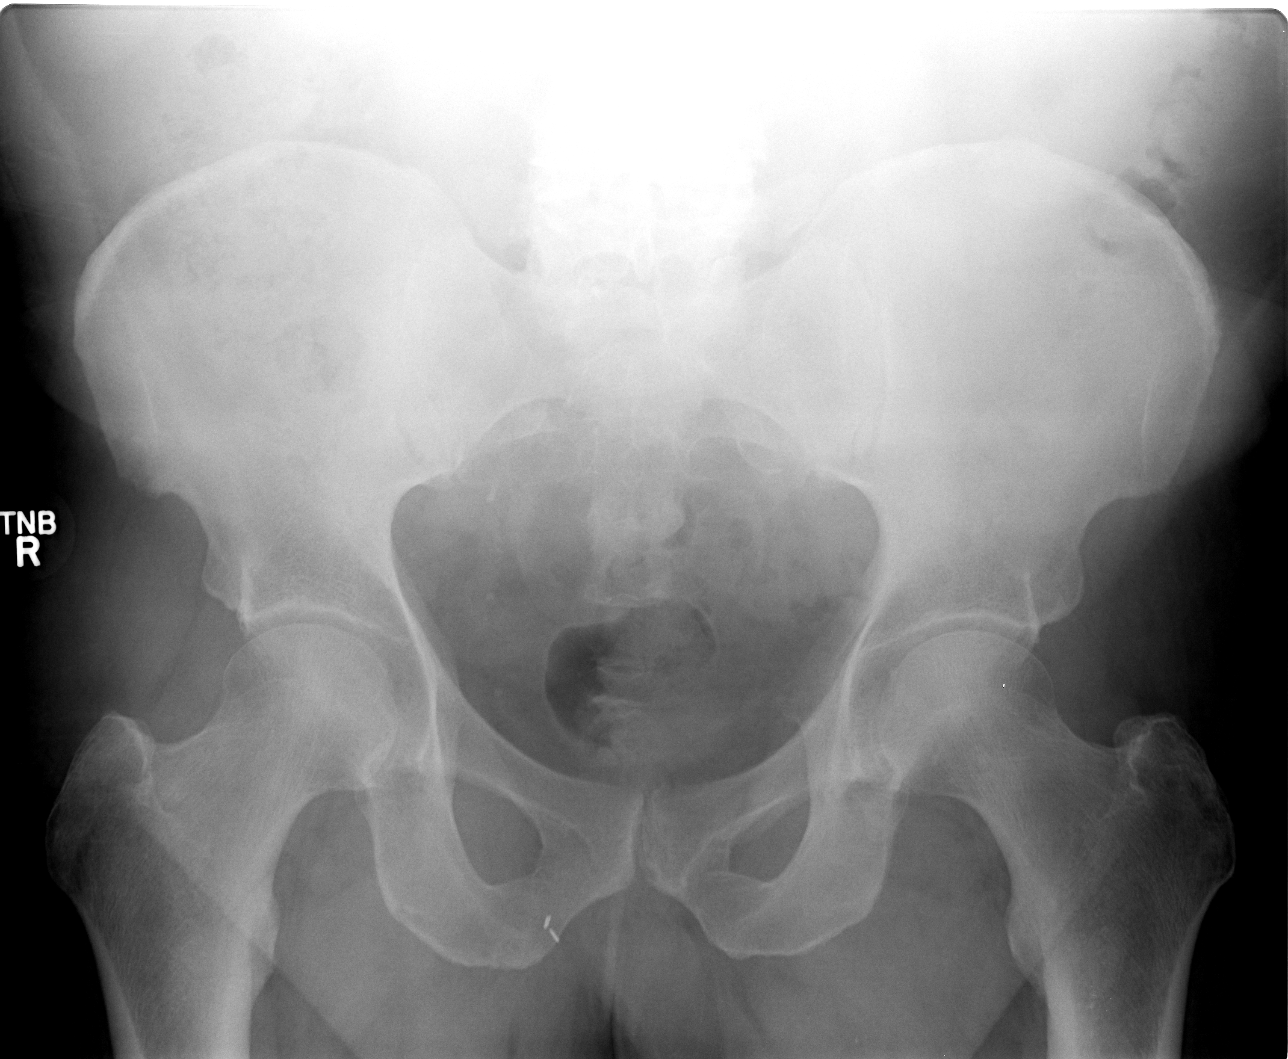

[view not recorded (2 of 3)]
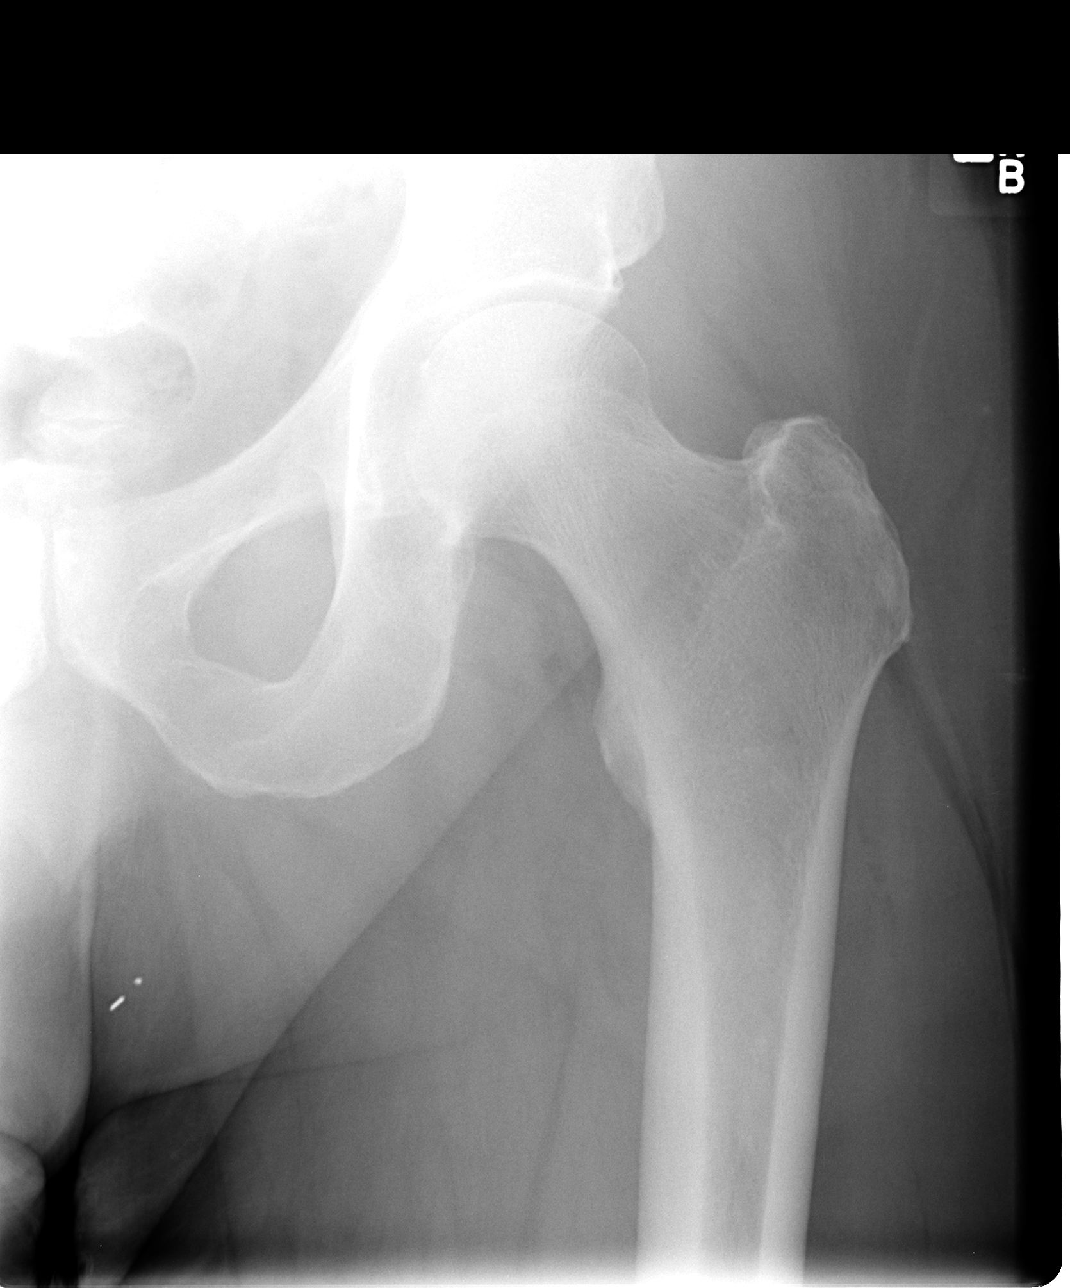

[view not recorded (3 of 3)]
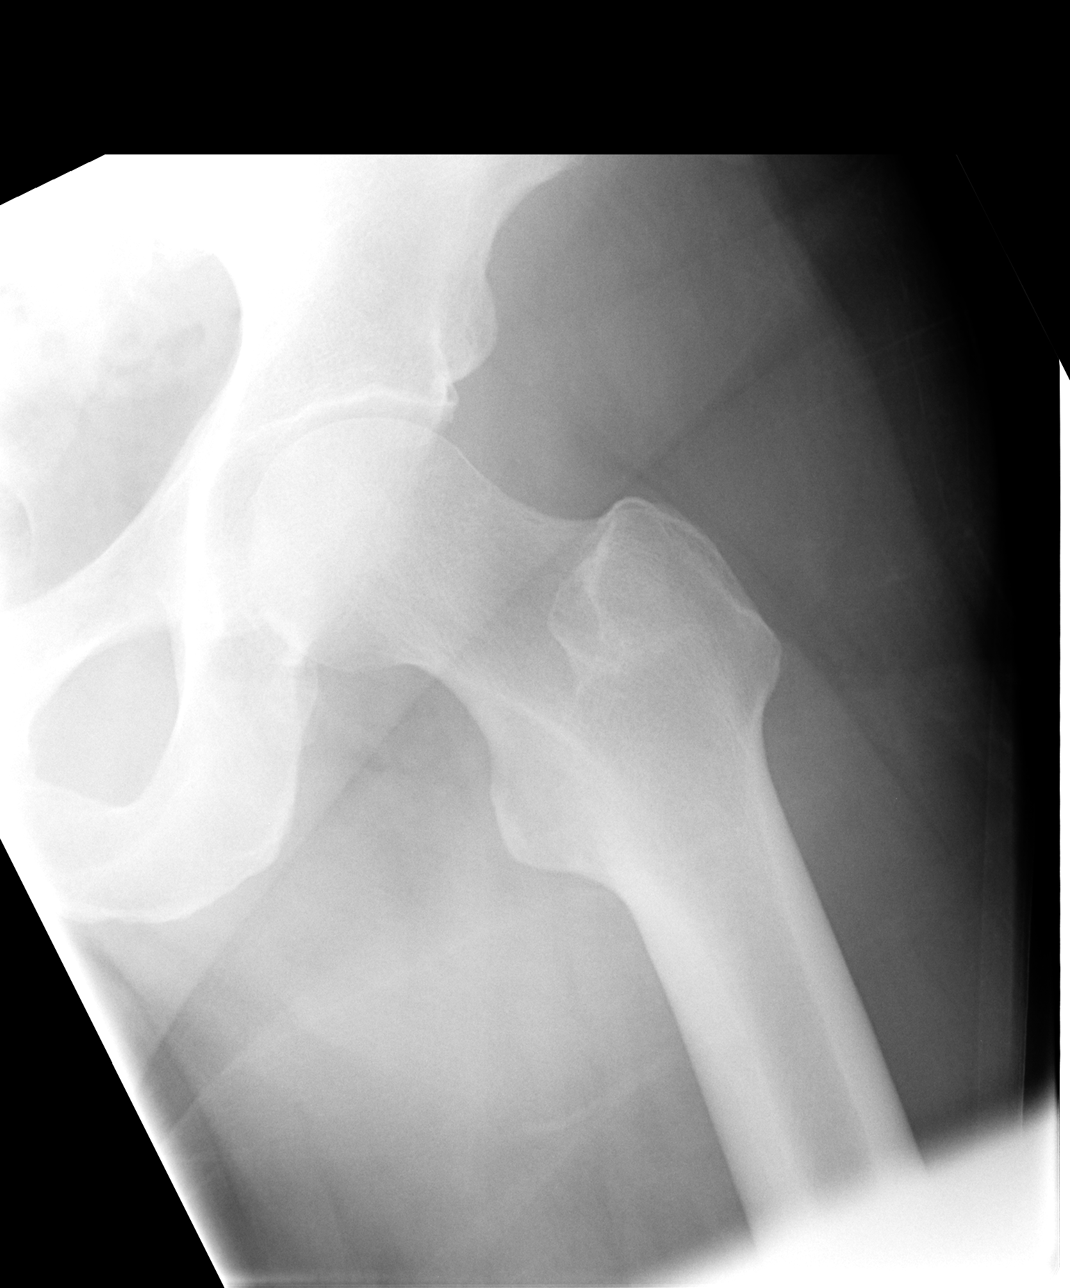

[3 of 3 positions shown; findings below may reference images not displayed]

FINDINGS: Frontal views of the pelvis and two additional views of
the left hip demonstrate no acute fracture, malalignment or
aggressive lytic or blastic osseous lesion.  No significant
degenerative changes.  Mastectomy clips project over the right
scrotum.  The visualized bowel gas pattern is unremarkable.  Normal
osseous mineralization.
IMPRESSION: Unremarkable pelvis and left hip.

## 2013-06-20 MED ORDER — TRAMADOL-ACETAMINOPHEN 37.5-325 MG PO TABS: 1.0000 | ORAL_TABLET | ORAL | Status: DC | PRN

## 2013-06-20 NOTE — Progress Notes (Signed)
  Subjective:    Patient ID: Miguel Morales, male    DOB: [birth year], 67 y.o.   MRN: [id number] Chief complaint is pain in both hips radiating down the backs of both legs associated with back pain which started 5 months ago Hip Pain    the referral was made to [place] by Dr. 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  Ambulation remains normal  Upper extremity exam  The right and left upper extremity:   Inspection and palpation revealed no abnormalities in the upper extremities.   Range of motion is full without contracture.  Motor exam is normal with grade 5 strength.  The joints are fully reduced without subluxation.  There is no atrophy or tremor and muscle tone is normal.  All joints are stable.   Lower extremity exam  Ambulation is normal.  The right and left lower extremity:  Inspection and palpation revealed no tenderness or abnormality in alignment in the lower extremities. Range of motion is full.  Strength is grade 5.  All joints are stable.   Today the lumbar spine is nontender he has adequate flexion extension without pain reflexes are 2+ at the knee and 0 at the ankles and are equal straight leg raises are  negative      Assessment & Plan:   Radiograph was ordered and RTC in it shows no problems related to his hips previous x-ray from several years ago also revealed normal hip joint  Impression spinal stenosis  Recommend continued ultrasound followup in 3 months for x-ray lumbar spine and reevaluation of his pain, Ultracet.

## 2013-06-20 NOTE — Patient Instructions (Addendum)
Spinal Stenosis One cause of back pain is spinal stenosis. Stenosis means abnormal narrowing. The spinal canal contains and protects the spinal nerve roots. In spinal stenosis, the spinal canal narrows and pinches the spinal cord and nerves. This causes low back pain and pain in the legs. Stenosis may pinch the nerves that control muscles and sensation in the legs. This leads to pain and abnormal feelings in the leg muscles and areas supplied by those nerves. CAUSES  Spinal stenosis often happens to people as they get older and arthritic boney growths occur in their spinal canal. There is also a loss of the disk height between the bones of the back, which also adds to this problem. Sometimes the problem is present at birth. SYMPTOMS   Pain that is generally worse with activities, particularly standing and walking.  Numbness, tingling, hot or cold feelings, weakness, or a weariness in the legs.  Clumsiness, frequent falling, and a foot-slapping gait, which may come as a result of nerve pressure and muscle weakness. DIAGNOSIS   Your caregiver may suspect spinal stenosis if you have unusual leg symptoms, such as those previously mentioned.  Your orthopedic surgeon may request special imaging exams, such a computerized magnetic scan (MRI) or computerized X-ray scan (CT) to find out the cause of the problem. TREATMENT   Sometimes treatments such as postural changes or nonsteroidal anti-inflammatory drugs will relieve the pain.  Nonsteroidal anti-inflammatory medications may help relieve symptoms. These medicines do this by decreasing swelling and inflammation in the nerves.  When stenosis causes severe nerve root compression, conservative treatment may not be enough to maintain a normal lifestyle. Surgery may be recommended to relieve the pressure on affected nerves. In properly selected patients, the results are very good, and patients are able to continue a normal lifestyle. HOME CARE  INSTRUCTIONS   Flexing the spine by leaning forward while walking may relieve symptoms. Lying with the knees drawn up to the chest may offer some relief. These positions enlarge the space available to the nerves. They may make it easier for stenosis sufferers to walk longer distances.  Rest, followed by gradually resuming activity, also can help.  Aerobic activity, such as bicycling or swimming, is often recommended.  Losing weight can also relieve some of the load on the spine.  Application of warm or cold compresses to the area of pain can be helpful. SEEK MEDICAL CARE IF:   The periods of relief between episodes of pain become shorter and shorter.  You experience pain that radiates down your leg, even when you are not standing or walking. SEEK IMMEDIATE MEDICAL CARE IF:   You have a loss of bowel or bladder control.  You have a sudden loss of feeling in your legs.  You suddenly cannot move your legs. Document Released: 01/16/2004 Document Revised: 01/18/2012 Document Reviewed: 03/13/2010 ExitCare Patient Information 2014 [employer].  

## 2013-06-29 DIAGNOSIS — L600 Ingrowing nail: Secondary | ICD-10-CM | POA: Diagnosis not present

## 2013-06-29 DIAGNOSIS — B351 Tinea unguium: Secondary | ICD-10-CM | POA: Diagnosis not present

## 2013-07-07 DIAGNOSIS — M549 Dorsalgia, unspecified: Secondary | ICD-10-CM | POA: Diagnosis not present

## 2013-07-12 ENCOUNTER — Encounter: Payer: Self-pay | Admitting: Family Medicine

## 2013-07-12 ENCOUNTER — Ambulatory Visit (INDEPENDENT_AMBULATORY_CARE_PROVIDER_SITE_OTHER): Payer: Medicare Other | Admitting: Family Medicine

## 2013-07-12 VITALS — BP 132/90 | Temp 98.9°F | Ht 66.0 in | Wt 207.0 lb

## 2013-07-12 DIAGNOSIS — R509 Fever, unspecified: Secondary | ICD-10-CM

## 2013-07-12 MED ORDER — DOXYCYCLINE HYCLATE 100 MG PO TABS: 100.0000 mg | ORAL_TABLET | Freq: Two times a day (BID) | ORAL | Status: DC

## 2013-07-12 NOTE — Progress Notes (Signed)
  Subjective:    Patient ID: Miguel Morales, male    DOB: 11-07-1946, 67 y.o.   MRN: [id number]  HPIPatient has pulled off 10-12 ticks in the last 2-3 weeks. About 5 days ago started fever and having headaches. He is also having fatique.   Tiny ticks small, arms shoulders etc  Fever and headache. Kicked in last wk. Ha off and on. Later in the day kicks in, didn't ck temp.  No new rashes.   Diffuse aching with the head, baseline arthritiss,   Review of Systems No vomiting no diarrhea no rash elsewhere ROS otherwise negative.    Objective:   Physical Exam  Alert no acute distress. Vitals stable. Lungs clear. Heart regular rate and rhythm. Small erythematous papules at site of bite. No rash elsewhere. Neck supple.      Assessment & Plan:  Impression febrile illness post tick bite likely viral done need to cover for tickborne illness rationale discussed. Plan Doxy 100 twice a day 10 days. Symptomatic care discussed. WSL

## 2013-07-13 ENCOUNTER — Other Ambulatory Visit: Payer: Self-pay | Admitting: Family Medicine

## 2013-07-25 DIAGNOSIS — Z23 Encounter for immunization: Secondary | ICD-10-CM | POA: Diagnosis not present

## 2013-08-07 DIAGNOSIS — M549 Dorsalgia, unspecified: Secondary | ICD-10-CM | POA: Diagnosis not present

## 2013-08-07 DIAGNOSIS — L299 Pruritus, unspecified: Secondary | ICD-10-CM | POA: Diagnosis not present

## 2013-08-07 DIAGNOSIS — M25569 Pain in unspecified knee: Secondary | ICD-10-CM | POA: Diagnosis not present

## 2013-08-07 DIAGNOSIS — F4310 Post-traumatic stress disorder, unspecified: Secondary | ICD-10-CM | POA: Diagnosis not present

## 2013-08-10 ENCOUNTER — Telehealth: Payer: Self-pay | Admitting: Orthopedic Surgery

## 2013-08-10 ENCOUNTER — Other Ambulatory Visit: Payer: Self-pay | Admitting: *Deleted

## 2013-08-10 DIAGNOSIS — M48061 Spinal stenosis, lumbar region without neurogenic claudication: Secondary | ICD-10-CM

## 2013-08-10 NOTE — Telephone Encounter (Signed)
Faxed referral and office notes to [facility]. Awaiting appointment. 

## 2013-08-10 NOTE — Telephone Encounter (Signed)
Miguel Morales said you diagnosed him as having spinal stenosis.  He asked if you will go ahead and refer him to Dr. Danielle Dess.  Also Asked if you could get him some information on spinal stenosis.  I will mail it if you have it. His # [phone]

## 2013-08-10 NOTE — Telephone Encounter (Signed)
Routing to Tammy.  

## 2013-08-10 NOTE — Telephone Encounter (Signed)
Send to tammy

## 2013-08-24 ENCOUNTER — Other Ambulatory Visit: Payer: Self-pay | Admitting: Nurse Practitioner

## 2013-09-01 DIAGNOSIS — R7309 Other abnormal glucose: Secondary | ICD-10-CM | POA: Diagnosis not present

## 2013-09-01 DIAGNOSIS — E785 Hyperlipidemia, unspecified: Secondary | ICD-10-CM | POA: Diagnosis not present

## 2013-09-05 ENCOUNTER — Telehealth: Payer: Self-pay | Admitting: *Deleted

## 2013-09-05 NOTE — Telephone Encounter (Signed)
Patient has an appointment with Dr. Danielle Dess 10/12/13 at 9:30 am. Patient is aware.

## 2013-09-07 DIAGNOSIS — R7309 Other abnormal glucose: Secondary | ICD-10-CM | POA: Diagnosis not present

## 2013-09-07 DIAGNOSIS — M549 Dorsalgia, unspecified: Secondary | ICD-10-CM | POA: Diagnosis not present

## 2013-09-07 DIAGNOSIS — E785 Hyperlipidemia, unspecified: Secondary | ICD-10-CM | POA: Diagnosis not present

## 2013-09-07 DIAGNOSIS — L570 Actinic keratosis: Secondary | ICD-10-CM | POA: Diagnosis not present

## 2013-09-11 ENCOUNTER — Telehealth: Payer: Self-pay | Admitting: Orthopedic Surgery

## 2013-09-11 NOTE — Telephone Encounter (Signed)
Patient cancelled his 09/19/13 appointment with Dr. Romeo Apple.  Said he has an appointment with Dr. Danielle Dess and will just wait to see him about his back

## 2013-09-11 NOTE — Telephone Encounter (Signed)
Noted  

## 2013-09-19 ENCOUNTER — Ambulatory Visit: Payer: Medicare Other | Admitting: Orthopedic Surgery

## 2013-09-25 ENCOUNTER — Other Ambulatory Visit: Payer: Self-pay | Admitting: Family Medicine

## 2013-09-25 ENCOUNTER — Telehealth: Payer: Self-pay | Admitting: Family Medicine

## 2013-09-25 MED ORDER — TIOTROPIUM BROMIDE MONOHYDRATE 18 MCG IN CAPS: 18.0000 ug | ORAL_CAPSULE | Freq: Every day | RESPIRATORY_TRACT | Status: DC

## 2013-09-25 NOTE — Telephone Encounter (Signed)
Patient needs Rx for a 30 day supply to [facility] for Spiriva  Also, [facility] is supposed to be calling to get a 90 day of the Spiriva

## 2013-09-25 NOTE — Telephone Encounter (Signed)
Medication was sent in to pharmacy. Patient was notified.  

## 2013-09-26 ENCOUNTER — Other Ambulatory Visit: Payer: Self-pay | Admitting: *Deleted

## 2013-09-26 MED ORDER — FLUTICASONE PROPIONATE 50 MCG/ACT NA SUSP: 1.0000 | Freq: Every day | NASAL | Status: DC

## 2013-10-10 ENCOUNTER — Other Ambulatory Visit: Payer: Self-pay | Admitting: Family Medicine

## 2013-10-12 DIAGNOSIS — M545 Low back pain: Secondary | ICD-10-CM | POA: Diagnosis not present

## 2013-10-12 DIAGNOSIS — M48061 Spinal stenosis, lumbar region without neurogenic claudication: Secondary | ICD-10-CM | POA: Diagnosis not present

## 2013-10-13 ENCOUNTER — Other Ambulatory Visit (HOSPITAL_COMMUNITY): Payer: Self-pay | Admitting: Neurological Surgery

## 2013-10-13 DIAGNOSIS — M48061 Spinal stenosis, lumbar region without neurogenic claudication: Secondary | ICD-10-CM

## 2013-10-16 ENCOUNTER — Other Ambulatory Visit: Payer: Self-pay | Admitting: *Deleted

## 2013-10-16 ENCOUNTER — Telehealth: Payer: Self-pay | Admitting: Family Medicine

## 2013-10-16 DIAGNOSIS — Z79899 Other long term (current) drug therapy: Secondary | ICD-10-CM

## 2013-10-16 DIAGNOSIS — E782 Mixed hyperlipidemia: Secondary | ICD-10-CM | POA: Diagnosis not present

## 2013-10-16 NOTE — Telephone Encounter (Signed)
Pt needs bw orders for future appt please, today if possible so he can do it tomorrow

## 2013-10-16 NOTE — Telephone Encounter (Signed)
Notified patient to let him know he could get his BW done and that he did not need papers.

## 2013-10-17 ENCOUNTER — Ambulatory Visit (HOSPITAL_COMMUNITY)
Admission: RE | Admit: 2013-10-17 | Discharge: 2013-10-17 | Disposition: A | Discharge status: Home / Self Care | Payer: Medicare Other | Source: Ambulatory Visit | Attending: Neurological Surgery | Admitting: Neurological Surgery

## 2013-10-17 ENCOUNTER — Encounter: Payer: Self-pay | Admitting: Family Medicine

## 2013-10-17 DIAGNOSIS — M5380 Other specified dorsopathies, site unspecified: Secondary | ICD-10-CM | POA: Insufficient documentation

## 2013-10-17 DIAGNOSIS — M48061 Spinal stenosis, lumbar region without neurogenic claudication: Secondary | ICD-10-CM | POA: Diagnosis not present

## 2013-10-17 DIAGNOSIS — M47817 Spondylosis without myelopathy or radiculopathy, lumbosacral region: Secondary | ICD-10-CM | POA: Insufficient documentation

## 2013-10-17 DIAGNOSIS — M545 Low back pain, unspecified: Secondary | ICD-10-CM | POA: Diagnosis not present

## 2013-10-17 DIAGNOSIS — M5126 Other intervertebral disc displacement, lumbar region: Secondary | ICD-10-CM | POA: Diagnosis not present

## 2013-10-17 LAB — BASIC METABOLIC PANEL
BUN: 15 mg/dL (ref 6–23)
CO2: 25 mEq/L (ref 19–32)
Calcium: 9.1 mg/dL (ref 8.4–10.5)
Creat: 1.11 mg/dL (ref 0.50–1.35)

## 2013-10-17 LAB — LIPID PANEL
Cholesterol: 211 mg/dL — ABNORMAL HIGH (ref 0–200)
Total CHOL/HDL Ratio: 6 Ratio
Triglycerides: 138 mg/dL (ref <150)

## 2013-10-17 LAB — HEPATIC FUNCTION PANEL
ALT: 19 U/L (ref 0–53)
Alkaline Phosphatase: 61 U/L (ref 39–117)
Bilirubin, Direct: 0.1 mg/dL (ref 0.0–0.3)
Indirect Bilirubin: 0.5 mg/dL (ref 0.0–0.9)
Total Protein: 6.3 g/dL (ref 6.0–8.3)

## 2013-10-19 ENCOUNTER — Other Ambulatory Visit: Payer: Self-pay | Admitting: *Deleted

## 2013-10-19 DIAGNOSIS — M48061 Spinal stenosis, lumbar region without neurogenic claudication: Secondary | ICD-10-CM | POA: Diagnosis not present

## 2013-10-19 DIAGNOSIS — I10 Essential (primary) hypertension: Secondary | ICD-10-CM | POA: Diagnosis not present

## 2013-10-19 DIAGNOSIS — E669 Obesity, unspecified: Secondary | ICD-10-CM | POA: Diagnosis not present

## 2013-10-19 MED ORDER — TIOTROPIUM BROMIDE MONOHYDRATE 18 MCG IN CAPS: 18.0000 ug | ORAL_CAPSULE | RESPIRATORY_TRACT | Status: DC

## 2013-10-24 ENCOUNTER — Ambulatory Visit (INDEPENDENT_AMBULATORY_CARE_PROVIDER_SITE_OTHER): Payer: Medicare Other | Admitting: Family Medicine

## 2013-10-24 ENCOUNTER — Encounter: Payer: Self-pay | Admitting: Family Medicine

## 2013-10-24 VITALS — BP 140/100 | Ht 66.0 in | Wt 207.4 lb

## 2013-10-24 DIAGNOSIS — E782 Mixed hyperlipidemia: Secondary | ICD-10-CM | POA: Diagnosis not present

## 2013-10-24 DIAGNOSIS — I10 Essential (primary) hypertension: Secondary | ICD-10-CM

## 2013-10-24 MED ORDER — AMLODIPINE BESYLATE 5 MG PO TABS: 5.0000 mg | ORAL_TABLET | Freq: Every day | ORAL | Status: DC

## 2013-10-24 NOTE — Patient Instructions (Signed)
   Start Norvasc 5 mg a day. Goal is top # 140 or less and bottom # 80's or less. Also send me some readings in a few weeks. Wellness exam by March.     DASH Diet The DASH diet stands for "Dietary Approaches to Stop Hypertension." It is a healthy eating plan that has been shown to reduce high blood pressure (hypertension) in as little as 14 days, while also possibly providing other significant health benefits. These other health benefits include reducing the risk of breast cancer after menopause and reducing the risk of type 2 diabetes, heart disease, colon cancer, and stroke. Health benefits also include weight loss and slowing kidney failure in patients with chronic kidney disease.  DIET GUIDELINES  Limit salt (sodium). Your diet should contain less than 1500 mg of sodium daily.  Limit refined or processed carbohydrates. Your diet should include mostly whole grains. Desserts and added sugars should be used sparingly.  Include small amounts of heart-healthy fats. These types of fats include nuts, oils, and tub margarine. Limit saturated and trans fats. These fats have been shown to be harmful in the body. CHOOSING FOODS  The following food groups are based on a 2000 calorie diet. See your Registered Dietitian for individual calorie needs. Grains and Grain Products (6 to 8 servings daily)  Eat More Often: Whole-wheat bread, brown rice, whole-grain or wheat pasta, quinoa, popcorn without added fat or salt (air popped).  Eat Less Often: White bread, white pasta, white rice, cornbread. Vegetables (4 to 5 servings daily)  Eat More Often: Fresh, frozen, and canned vegetables. Vegetables may be raw, steamed, roasted, or grilled with a minimal amount of fat.  Eat Less Often/Avoid: Creamed or fried vegetables. Vegetables in a cheese sauce. Fruit (4 to 5 servings daily)  Eat More Often: All fresh, canned (in natural juice), or frozen fruits. Dried fruits without added sugar. One hundred percent  fruit juice ( cup [237 mL] daily).  Eat Less Often: Dried fruits with added sugar. Canned fruit in light or heavy syrup. [employer], Fish, and Poultry (2 servings or less daily. One serving is 3 to 4 oz [85-114 g]).  Eat More Often: Ninety percent or leaner ground beef, tenderloin, sirloin. Round cuts of beef, chicken breast, [place] breast. All fish. Grill, bake, or broil your meat. Nothing should be fried.  Eat Less Often/Avoid: Fatty cuts of meat, [place], or chicken leg, thigh, or wing. Fried cuts of meat or fish. Dairy (2 to 3 servings)  Eat More Often: Low-fat or fat-free milk, low-fat plain or light yogurt, reduced-fat or part-skim cheese.  Eat Less Often/Avoid: Milk (whole, 2%).Whole milk yogurt. Full-fat cheeses. Nuts, Seeds, and Legumes (4 to 5 servings per week)  Eat More Often: All without added salt.  Eat Less Often/Avoid: Salted nuts and seeds, canned beans with added salt. Fats and Sweets (limited)  Eat More Often: Vegetable oils, tub margarines without trans fats, sugar-free gelatin. Mayonnaise and salad dressings.  Eat Less Often/Avoid: Coconut oils, palm oils, butter, stick margarine, cream, half and half, cookies, candy, pie. FOR MORE INFORMATION The Dash Diet Eating Plan: www.dashdiet.org Document Released: 10/15/2011 Document Revised: 01/18/2012 Document Reviewed: 10/15/2011 [facility] Patient Information 2014 [place], [place].

## 2013-10-24 NOTE — Progress Notes (Signed)
   Subjective:    Patient ID: Miguel Morales, male    DOB: [birth year], 67 y.o.   MRN: [id number]  HPI Patient is here today to go over blood work.   He has been keeping a log of his BP's. They run from 190's/100's.   Pt also wants to discuss his 'statin b/c they are giving him muscle aches in his legs.  Discussion was held regarding his blood pressures they've been elevated on multiple readings no chest tightness pressure pain or shortness of breath  Discussion was held regarding statins in the importance of getting cholesterol down. Patient will try a lower dose and in frequent basis gradually titrating to a dose that he can tolerate PMH benign  Review of Systems Patient denies vomiting diarrhea denies fever chills muscle aches currently he relates back pain related to spinal stenosis under the care of neurosurgery    Objective:   Physical Exam Lungs are clear hearts regular pulse normal abdomen soft extremities no edema skin warm       Assessment & Plan:  #1 HTN start Norvasc 5 mg daily send [place] blood pressure readings every few weeks followup again for wellness exam by spring #2 hyperlipidemia we will restart a toward a statin he R. he has at home stopped red rice yeast extract. Initially he will take just 2 per week then he will go to taking 3 per week and gradually increase as tolerated

## 2013-10-26 ENCOUNTER — Other Ambulatory Visit: Payer: Self-pay | Admitting: *Deleted

## 2013-10-26 MED ORDER — TIOTROPIUM BROMIDE MONOHYDRATE 18 MCG IN CAPS: 18.0000 ug | ORAL_CAPSULE | RESPIRATORY_TRACT | Status: DC

## 2013-10-27 ENCOUNTER — Encounter: Payer: Self-pay | Admitting: Family Medicine

## 2013-10-27 ENCOUNTER — Ambulatory Visit (INDEPENDENT_AMBULATORY_CARE_PROVIDER_SITE_OTHER): Payer: Medicare Other | Admitting: Family Medicine

## 2013-10-27 VITALS — BP 118/78 | Temp 98.6°F | Ht 66.0 in | Wt 206.8 lb

## 2013-10-27 DIAGNOSIS — J329 Chronic sinusitis, unspecified: Secondary | ICD-10-CM | POA: Diagnosis not present

## 2013-10-27 MED ORDER — AMOXICILLIN 500 MG PO TABS: ORAL_TABLET | ORAL | Status: DC

## 2013-10-27 NOTE — Progress Notes (Signed)
   Subjective:    Patient ID: Miguel Morales, male    DOB: October 11, 1946, 67 y.o.   MRN: [id number]  Sinus Problem This is a new problem. The current episode started today. Associated symptoms include congestion, headaches, sinus pressure and a sore throat.   Throat got real raspu  Felt clogged up, flu shot given  Headache behind the eyes  Motrin prn for knees  Noted low gr fever,  Not much cough,  Deep eyes and eyebrow  No trouble with the copd Review of Systems  HENT: Positive for congestion, sinus pressure and sore throat.   Neurological: Positive for headaches.   ROS otherwise negative    Objective:   Physical Exam  Alert moderate malaise. Frontal tenderness maxillary tenderness. Pharynx slight erythema neck supple lungs clear. Heart regular in rhythm.      Assessment & Plan:  Impression 1 rhinosinusitis plan symptomatic care discussed with history of COPD warning signs discussed carefully. Possible element of bronchitis also discussed. WSL

## 2013-10-31 ENCOUNTER — Ambulatory Visit (HOSPITAL_COMMUNITY): Payer: Medicare Other | Admitting: Physical Therapy

## 2013-11-06 ENCOUNTER — Ambulatory Visit (HOSPITAL_COMMUNITY): Admission: RE | Admit: 2013-11-06 | Payer: Medicare Other | Source: Ambulatory Visit | Admitting: Physical Therapy

## 2013-11-07 ENCOUNTER — Ambulatory Visit (HOSPITAL_COMMUNITY)
Admission: RE | Admit: 2013-11-07 | Discharge: 2013-11-07 | Disposition: A | Discharge status: Home / Self Care | Payer: Medicare Other | Source: Ambulatory Visit | Attending: Family Medicine | Admitting: Family Medicine

## 2013-11-07 DIAGNOSIS — IMO0001 Reserved for inherently not codable concepts without codable children: Secondary | ICD-10-CM | POA: Diagnosis not present

## 2013-11-07 DIAGNOSIS — J4489 Other specified chronic obstructive pulmonary disease: Secondary | ICD-10-CM | POA: Insufficient documentation

## 2013-11-07 DIAGNOSIS — M545 Low back pain, unspecified: Secondary | ICD-10-CM | POA: Diagnosis not present

## 2013-11-07 DIAGNOSIS — M48062 Spinal stenosis, lumbar region with neurogenic claudication: Secondary | ICD-10-CM | POA: Insufficient documentation

## 2013-11-07 DIAGNOSIS — I10 Essential (primary) hypertension: Secondary | ICD-10-CM | POA: Diagnosis not present

## 2013-11-07 DIAGNOSIS — M533 Sacrococcygeal disorders, not elsewhere classified: Secondary | ICD-10-CM | POA: Insufficient documentation

## 2013-11-07 DIAGNOSIS — J449 Chronic obstructive pulmonary disease, unspecified: Secondary | ICD-10-CM | POA: Diagnosis not present

## 2013-11-07 NOTE — Evaluation (Signed)
Physical Therapy Evaluation  Patient Details  Name: Miguel Morales MRN: [id number] Date of Birth: [birth year]  Today's Date: 11/07/2013 Time: 0930-1015 PT Time Calculation (min): 45 min Charges: 1 evaluation              Visit#: 1 of 10  Re-eval: 12/07/13 Assessment Diagnosis: low back pain Next MD Visit: Dr. Danielle Dess - unscheduled  Authorization: Medicare    Authorization Time Period:    Authorization Visit#: 1 of 10   Past Medical History:  Past Medical History  Diagnosis Date  . COPD (chronic obstructive pulmonary disease)   . Mixed hyperlipidemia   . GERD (gastroesophageal reflux disease)   . Cancer   . Asthma    Past Surgical History:  Past Surgical History  Procedure Laterality Date  . Knee arthroscopy    . Septoplasty    . Circumcision      Subjective Symptoms/Limitations Symptoms: Pt is a 67 year old male referred to PT for low back pain.  he reports that he remebers 47 years ago when he was in [place] he feel on his tailbone and limped around for a few weeks and then went on with his life.  He has been going to a chiropractor for most of his adult life which has helped with his maintaince program.  He had a flare up in pain last January and managed by stretching.  He had a x-ray and MRI with lumbar stenosis and degnerative disc disease.  His pain was worse in the morning.  He is taking tyelonol and tramadol and ibuprofen for pain control. Has not tried ice or heat.  Pertinent History: COPD, knee pain Limitations: Standing;House hold activities How long can you sit comfortably?: no difficulty ( no pain when sitting or laying down), stiffness going from sit to stand How long can you stand comfortably?: 10 minutes How long can you walk comfortably?: 30 minutes  Patient Stated Goals: Get off pain medication and decrease pain.  Pain Assessment Currently in Pain?: Yes Pain Score: 2  Pain Location: Hip Pain Orientation: Right;Left;Lower Pain Type: Chronic pain Pain  Onset: More than a month ago Pain Frequency: Intermittent Pain Relieving Factors: tramdol and tyelonol.   Balance Screening Balance Screen Has the patient fallen in the past 6 months: No Has the patient had a decrease in activity level because of a fear of falling? : No Is the patient reluctant to leave their home because of a fear of falling? 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  Flexibility Thomas: Positive Obers: Positive 90/90: Positive Functional Tests Functional Tests: FOTO: 50/50  Assessment RLE Assessment RLE Assessment: Within Functional Limits LLE Assessment LLE Assessment: Within Functional Limits Lumbar AROM Lumbar Flexion: WNL - pain at end range Lumbar Extension: WNL  -decreased pain/symptoms Lumbar - Right Side Bend: Decreased 25% Lumbar - Left Side Bend: Decreased 25% Lumbar - Right  Rotation: decreased 50% Lumbar - Left Rotation: decreased 50% Palpation Palpation: increased gluteal and paraspinal resting tone. mild pain and tenderness with deep palpation over Lt SI joint and lumbosacral musculature.  Without pain into gluteal region.   Mobility/Balance   Ambulation/Gait Ambulation/Gait: Yes Gait Pattern: Decreased trunk rotation;Narrow base of support Posture/Postural Control Posture/Postural Control: Postural limitations Postural Limitations: upper cross syndrom  Exercise/Treatments Standing Other Standing Lumbar Exercises: back extension with feet forward x10 Seated Other Seated Lumbar Exercises: Heel roll outs 5x5 sec holds with cueing for posture Other Seated Lumbar Exercises: Pelvic Floor contraction and TrA contraction cueing for posture  Physical Therapy Assessment and Plan PT Assessment and Plan Clinical Impression Statement: Pt is a 67 year old male referred to PT for lumbar spinal stenosis with impairments listed below.  At this time pt has mild SI dysfunction along with significant impaired muscle length (flexibility) to hip musculature and causing radicular symptoms into his legs with hip IR activities.   Pt will benefit from skilled therapeutic intervention in order to improve on the following deficits: Cardiopulmonary status limiting activity;Abnormal gait;Improper spinal/pelvic alignment;Pain;Decreased coordination;Impaired perceived functional ability;Decreased range of motion;Increased muscle spasms;Increased fascial restricitons Rehab Potential: Good PT Frequency: Min 3X/week (3x/wk x2 weeks 2x/week x4 weeks) PT Duration: 6 weeks PT Treatment/Interventions: Stair training;Functional mobility training;Therapeutic activities;Therapeutic exercise;Neuromuscular re-education;Patient/family education;Manual techniques;Modalities;Gait training PT Plan: MET for proper SI alignment. Continue with core stabilization exercises.  Elevate head when necessary seconary to respiratory dysfunction. Educate on diaphragmatic breathing.     Goals Home Exercise Program Pt/caregiver will Perform Home Exercise Program: Independently PT Goal: Perform Home Exercise Program - Progress: Goal set today PT Short Term Goals Time to Complete Short  Term Goals: 3 weeks PT Short Term Goal 1: Pt will improve his core coordination in order to report a decrease in radicular pain to a 3/10 when standing for greater than 15 minutes.  PT Short Term Goal 2: Pt will improve his core, hip and LE flexibility and sciatic nerve length to [facility] in order to tolerate seated position with hip IR and back extension without reports of radicular pain.  PT Short Term Goal 3: Pt will present with normalized sacroiliac to decrease pain to his hip and lumbar region to improve QOL.  PT Short Term Goal 4: Pt will be educated on proper posture to improve respiratory funciton.  PT Short Term Goal 5: Pt will present with minimal gluteal and lumbar paraspinal restrictions and requires min cueing to decrease resting tone in order to decrease need of OTC medications to improve QOL.  PT Long Term Goals Time to Complete Long Term Goals:  (6 weeks) PT Long Term Goal 1: Pt will improve his core strength to [facility] in order to tolerate standing for greater than 30 minutes to continue with light house work.  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  Long Term Goal 3: Pt will improve his FOTO to limiation less than 38% for improved percieved functional ability.   Problem List Patient Active Problem List   Diagnosis Date Noted  . Lumbar stenosis with neurogenic claudication 11/07/2013  . Sacroiliac joint dysfunction 11/07/2013  . HTN (hypertension), benign 10/24/2013  . Spinal stenosis, lumbar region, with neurogenic claudication 06/20/2013  . Shortness of breath 03/23/2012  . COPD (chronic obstructive pulmonary disease) 03/23/2012  . Mixed hyperlipidemia 03/23/2012    PT - End of Session Activity Tolerance: Patient  tolerated treatment well General Behavior During Therapy: WFL for tasks assessed/performed PT Plan of Care PT Home Exercise Plan: given PT Patient  Instructions: importance of HEP, flexibility and posture.  Consulted and Agree with Plan of Care: Patient  GP Functional Assessment Tool Used: FOTO: 50/50 Functional Limitation: Other PT primary Other PT Primary Current Status (N8295): At least 40 percent but less than 60 percent impaired, limited or restricted Other PT Primary Goal Status (A2130): At least 20 percent but less than 40 percent impaired, limited or restricted  Sumiye Hirth, MPT, ATC 11/07/2013, 10:52 AM  Physician Documentation Your signature is required to indicate approval of the treatment plan as stated above.  Please sign and either send electronically or make a copy of this report for your files and return this physician signed original.   Please mark one 1.__approve of plan  2. ___approve of plan with the following conditions.   ______________________________                                                          _____________________ Physician Signature                                                                                                             Date

## 2013-11-14 ENCOUNTER — Ambulatory Visit (HOSPITAL_COMMUNITY)
Admission: RE | Admit: 2013-11-14 | Discharge: 2013-11-14 | Disposition: A | Discharge status: Home / Self Care | Payer: Medicare Other | Source: Ambulatory Visit | Attending: Neurological Surgery | Admitting: Neurological Surgery

## 2013-11-14 DIAGNOSIS — M533 Sacrococcygeal disorders, not elsewhere classified: Secondary | ICD-10-CM

## 2013-11-14 DIAGNOSIS — M545 Low back pain, unspecified: Secondary | ICD-10-CM | POA: Diagnosis not present

## 2013-11-14 DIAGNOSIS — M48062 Spinal stenosis, lumbar region with neurogenic claudication: Secondary | ICD-10-CM

## 2013-11-14 DIAGNOSIS — IMO0001 Reserved for inherently not codable concepts without codable children: Secondary | ICD-10-CM | POA: Diagnosis not present

## 2013-11-14 DIAGNOSIS — I10 Essential (primary) hypertension: Secondary | ICD-10-CM | POA: Diagnosis not present

## 2013-11-14 DIAGNOSIS — J4489 Other specified chronic obstructive pulmonary disease: Secondary | ICD-10-CM | POA: Insufficient documentation

## 2013-11-14 DIAGNOSIS — J449 Chronic obstructive pulmonary disease, unspecified: Secondary | ICD-10-CM | POA: Insufficient documentation

## 2013-11-14 NOTE — Progress Notes (Signed)
Physical Therapy Treatment Patient Details  Name: Miguel Morales MRN: [id number] Date of Birth: Jul 26, 1946  Today's Date: 11/14/2013 Time: 6213-0865 PT Time Calculation (min): 40 min Charge ;Manual [phone], TE [id number]  Visit#: 2 of 10  Re-eval: 12/07/13 Assessment Diagnosis: low back pain Next MD Visit: Dr. Ellene Route - unscheduled  Authorization: Medicare  Authorization Time Period:    Authorization Visit#: 2 of 10   Subjective: Symptoms/Limitations Symptoms: Pt reports compliance with HEP with no questions, currently pain free today.   Pain Assessment Currently in Pain?: No/denies  Objective:  Exercise/Treatments Seated Other Seated Lumbar Exercises: Heel roll outs 5x5 sec holds with cueing for posture Other Seated Lumbar Exercises: on dynadisc anterior/posterior rotatoin;  diaphragamatic breating 10 reps Supine Ab Set: Limitations AB Set Limitations: PFC 10x 10" with tactile and verbal cueing Bent Knee Raise: 10 reps;5 seconds Bridge: 10 reps Straight Leg Raise: 10 reps Sidelying Hip Abduction: 10 reps Prone  Straight Leg Raise: 10 reps Other Prone Lumbar Exercises: hamstring curls 10x  Manual Therapy Manual Therapy: Other (comment) Other Manual Therapy: Sacroiliac joint within alignment, no musc;e emergy technique required this session.  Physical Therapy Assessment and Plan PT Assessment and Plan Clinical Impression Statement: Sacroiliac joint within alignment, no muscle energy technique required.  Sessoin focus on core stability and LE strengthening exercises to keep SI joint within alignment.  Pt did required tactile and verbal cueing for correct musculature activation with PFC.  No reports of pain throught session.   PT Plan: MET for proper SI alignment. Continue with core stabilization exercises.  Elevate head when necessary seconary to respiratory dysfunction. Educate on diaphragmatic breathing.     Goals Home Exercise Program Pt/caregiver will Perform  Home Exercise Program: Independently PT Short Term Goals Time to Complete Short Term Goals: 3 weeks PT Short Term Goal 1: Pt will improve his core coordination in order to report a decrease in radicular pain to a 3/10 when standing for greater than 15 minutes.  PT Short Term Goal 1 - Progress: Progressing toward goal PT Short Term Goal 2: Pt will improve his core, hip and LE flexibility and sciatic nerve length to [facility] in order to tolerate seated position with hip IR and back extension without reports of radicular pain.  PT Short Term Goal 2 - Progress: Progressing toward goal PT Short Term Goal 3: Pt will present with normalized sacroiliac to decrease pain to his hip and lumbar region to improve QOL.  PT Short Term Goal 4: Pt will be educated on proper posture to improve respiratory funciton.  PT Short Term Goal 5: Pt will present with minimal gluteal and lumbar paraspinal restrictions and requires min cueing to decrease resting tone in order to decrease need of OTC medications to improve QOL.  PT Long Term Goals Time to Complete Long Term Goals:  (6 weeks) PT Long Term Goal 1: Pt will improve his core strength to [facility] in order to tolerate standing for greater than 30 minutes to continue with light house work.  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  Long Term Goal 3: Pt will improve his FOTO to limiation less than 38% for improved percieved functional ability.   Problem List Patient Active Problem List   Diagnosis Date Noted  . Lumbar stenosis with neurogenic claudication 11/07/2013  . Sacroiliac joint dysfunction 11/07/2013  . HTN (hypertension), benign 10/24/2013  . Spinal stenosis, lumbar region,  with neurogenic claudication 06/20/2013  . Shortness of breath 03/23/2012  . COPD (chronic obstructive pulmonary disease) 03/23/2012  . Mixed hyperlipidemia 03/23/2012    PT -  End of Session Activity Tolerance: Patient tolerated treatment well General Behavior During Therapy: [facility] for tasks assessed/performed  GP    Aldona Lento 11/14/2013, 12:25 PM

## 2013-11-16 ENCOUNTER — Ambulatory Visit (HOSPITAL_COMMUNITY)
Admission: RE | Admit: 2013-11-16 | Discharge: 2013-11-16 | Disposition: A | Discharge status: Home / Self Care | Payer: Medicare Other | Source: Ambulatory Visit | Attending: Family Medicine | Admitting: Family Medicine

## 2013-11-16 DIAGNOSIS — M533 Sacrococcygeal disorders, not elsewhere classified: Secondary | ICD-10-CM

## 2013-11-16 DIAGNOSIS — IMO0001 Reserved for inherently not codable concepts without codable children: Secondary | ICD-10-CM | POA: Diagnosis not present

## 2013-11-16 DIAGNOSIS — M545 Low back pain, unspecified: Secondary | ICD-10-CM | POA: Diagnosis not present

## 2013-11-16 DIAGNOSIS — M48062 Spinal stenosis, lumbar region with neurogenic claudication: Secondary | ICD-10-CM

## 2013-11-16 DIAGNOSIS — J449 Chronic obstructive pulmonary disease, unspecified: Secondary | ICD-10-CM | POA: Diagnosis not present

## 2013-11-16 DIAGNOSIS — I10 Essential (primary) hypertension: Secondary | ICD-10-CM | POA: Diagnosis not present

## 2013-11-16 NOTE — Progress Notes (Signed)
Physical Therapy Treatment Patient Details  Name: Miguel Morales MRN: [id number] Date of Birth: 1946-06-23  Today's Date: 11/16/2013 Time: 1035-1103 PT Time Calculation (min): 28 min Charge TE [id number]  Visit#: 3 of 10  Re-eval: 12/07/13 Assessment Diagnosis: low back pain Next MD Visit: Dr. Ellene Route - unscheduled  Authorization: Medicare  Authorization Time Period:    Authorization Visit#: 3 of 10   Subjective: Symptoms/Limitations Symptoms: Pt late for apt today.  Reports currently pain free today. Pain Assessment Currently in Pain?: No/denies  Objective:   Exercise/Treatments Stretches Piriformis Stretch: Limitations Piriformis Stretch Limitations: begin next session Standing Other Standing Lumbar Exercises: Gait training to reduce ER Rt LE Seated Other Seated Lumbar Exercises: Heel roll outs 10x10 sec holds with cueing for posture Supine Ab Set: Limitations AB Set Limitations: PFC 10x 10" with tactile and verbal cueing Clam: 5 reps;Limitations Clam Limitations: 10 seconds hold with ab set Sidelying   Prone    Quadruped    Manual Therapy Manual Therapy: Other (comment) Other Manual Therapy: Sacroiliac joint within alignment, no muscle energy technique required.  Physical Therapy Assessment and Plan PT Assessment and Plan Clinical Impression Statement: SI joint within alignment, no muscle energy technique required.  Session focus on improving core stability.  Tactile and verbal cueing required for correct mm activaitn with PFC, pt counted 10" outloud to assure breathing.  Diaphragmatic breathing instructed to improve deeper breathing to assist with respiratory dysfunction.  Supine exercises completed with elevated head.  Educated pt on hip anatomy in order to facilitate proper gait to reduce radicular symptoms.   PT Plan: MET for proper SI alignment. Continue with core stabilization exercises.  Elevate head when necessary seconary to respiratory dysfunction.  Educate on diaphragmatic breathing.     Goals Home Exercise Program Pt/caregiver will Perform Home Exercise Program: Independently PT Short Term Goals Time to Complete Short Term Goals: 3 weeks PT Short Term Goal 1: Pt will improve his core coordination in order to report a decrease in radicular pain to a 3/10 when standing for greater than 15 minutes.  PT Short Term Goal 1 - Progress: Progressing toward goal PT Short Term Goal 2: Pt will improve his core, hip and LE flexibility and sciatic nerve length to [facility] in order to tolerate seated position with hip IR and back extension without reports of radicular pain.  PT Short Term Goal 3: Pt will present with normalized sacroiliac to decrease pain to his hip and lumbar region to improve QOL.  PT Short Term Goal 3 - Progress: Progressing toward goal PT Short Term Goal 4: Pt will be educated on proper posture to improve respiratory funciton.  PT Short Term Goal 4 - Progress: Progressing toward goal PT Short Term Goal 5: Pt will present with minimal gluteal and lumbar paraspinal restrictions and requires min cueing to decrease resting tone in order to decrease need of OTC medications to improve QOL.  PT Long Term Goals Time to Complete Long Term Goals:  (6 weeks) PT Long Term Goal 1: Pt will improve his core strength to [facility] in order to tolerate standing for greater than 30 minutes to continue with light house work.  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  Long Term Goal 3: Pt will improve his FOTO to limiation less than 38% for improved percieved functional ability.   Problem List Patient Active Problem List   Diagnosis Date Noted  .  Lumbar stenosis with neurogenic claudication 11/07/2013  . Sacroiliac joint dysfunction 11/07/2013  . HTN (hypertension), benign 10/24/2013  . Spinal stenosis, lumbar region, with neurogenic  claudication 06/20/2013  . Shortness of breath 03/23/2012  . COPD (chronic obstructive pulmonary disease) 03/23/2012  . Mixed hyperlipidemia 03/23/2012    PT - End of Session Activity Tolerance: Patient tolerated treatment well General Behavior During Therapy: [facility] for tasks assessed/performed  GP    Aldona Lento 11/16/2013, 11:58 AM

## 2013-11-17 ENCOUNTER — Ambulatory Visit (HOSPITAL_COMMUNITY)
Admission: RE | Admit: 2013-11-17 | Discharge: 2013-11-17 | Disposition: A | Discharge status: Home / Self Care | Payer: Medicare Other | Source: Ambulatory Visit | Attending: Family Medicine | Admitting: Family Medicine

## 2013-11-17 DIAGNOSIS — M545 Low back pain, unspecified: Secondary | ICD-10-CM | POA: Diagnosis not present

## 2013-11-17 DIAGNOSIS — M533 Sacrococcygeal disorders, not elsewhere classified: Secondary | ICD-10-CM

## 2013-11-17 DIAGNOSIS — IMO0001 Reserved for inherently not codable concepts without codable children: Secondary | ICD-10-CM | POA: Diagnosis not present

## 2013-11-17 DIAGNOSIS — J449 Chronic obstructive pulmonary disease, unspecified: Secondary | ICD-10-CM | POA: Diagnosis not present

## 2013-11-17 DIAGNOSIS — I10 Essential (primary) hypertension: Secondary | ICD-10-CM | POA: Diagnosis not present

## 2013-11-17 DIAGNOSIS — M48062 Spinal stenosis, lumbar region with neurogenic claudication: Secondary | ICD-10-CM

## 2013-11-17 NOTE — Evaluation (Signed)
Physical Therapy Discharge Summary/Treatment  Patient Details  Name: Miguel Morales MRN: [id number] Date of Birth: [birth year]  Today's Date: 11/17/2013 Time: 4818-5631 PT Time Calculation (min): 40 min Charges: ROM: 1305-1306 TE: 4970-2637 Self Care: 8588-5027             Visit#: 4 of 10  Re-eval: 12/07/13 Assessment Diagnosis: low back pain Next MD Visit: Dr. Ellene Route - unscheduled  Authorization: Medicare    Authorization Time Period:    Authorization Visit#: 4 of 10   Subjective Symptoms/Limitations Symptoms: Pt reports that he has been doing his exercises at home and is independent with the McKenzie exercise.  he does not have any questions and overall is feeling.  How long can you stand comfortably?: no difficulty How long can you walk comfortably?: no limited due to pain Pain Assessment Currently in Pain?: No/denies  Sensation/Coordination/Flexibility/Functional Tests Functional Tests Functional Tests: FOTO: 75/25 (was 50/50)  Assessment Lumbar AROM Lumbar Flexion: WNL Lumbar Extension: WNL Lumbar - Right Side Bend: WNL Lumbar - Left Side Bend: WNL Lumbar - Right Rotation: WNL Lumbar - Left Rotation: WNL Palpation Palpation: without pain and tenderness to gluteal region  Mobility/Balance  Posture/Postural Control Posture/Postural Control: Postural limitations Postural Limitations: upper cross syndrom   Exercise/Treatments Standing Heel Raises: 10 reps;Limitations Heel Raises Limitations: Toe raises 10 reps Functional Squats: 10 reps Lifting: From waist;10 reps;Weights Lifting Weights (lbs): 15 lb weighted box, with education on proper turning and lifting technique Side Lunge: 10 reps;Limitations Side Lunge Limitations: LLE only w/PT facilation Scapular Retraction: Both;10 reps;Theraband Theraband Level (Scapular Retraction): Level 3 (Green) Row: Both;10 reps;Theraband Theraband Level (Row): Level 3 (Green) Shoulder Extension: Both;10  reps;Theraband Theraband Level (Shoulder Extension): Level 3 (Green) Shoulder ADduction: Both;10 reps;Theraband Theraband Level (Shoulder Adduction): Level 3 (Green)      Physical Therapy Assessment and Plan PT Assessment and Plan Clinical Impression Statement: Mr. Hellberg has attended 4 OP PT visits over the 8 days with the following findings: he is independent with all HEP, including McKenezie exercises he was referred to purchase from his MD.  At this time he does not have increased pain and is very pleased with the progress he has made with his independent exercises.  Updated his HEP to improve his postural strength and with a theraband.  Pt is will to be d/c from PT at this time.  PT Plan: D/C    Goals Home Exercise Program Pt/caregiver will Perform Home Exercise Program: Independently PT Short Term Goals Time to Complete Short Term Goals: 3 weeks PT Short Term Goal 1: Pt will improve his core coordination in order to report a decrease in radicular pain to a 3/10 when standing for greater than 15 minutes.  PT Short Term Goal 1 - Progress: Met PT Short Term Goal 2: Pt will improve his core, hip and LE flexibility and sciatic nerve length to [facility] in order to tolerate seated position with hip IR and back extension without reports of radicular pain.  PT Short Term Goal 2 - Progress: Partly met PT Short Term Goal 3: Pt will present with normalized sacroiliac to decrease pain to his hip and lumbar region to improve QOL.  PT Short Term Goal 3 - Progress: Met PT Short Term Goal 4: Pt will be educated on proper posture to improve respiratory funciton.  PT Short Term Goal 4 - Progress: Met PT Short Term Goal 5: Pt will present with minimal gluteal and lumbar paraspinal restrictions and requires min cueing to decrease resting tone in  order to decrease need of OTC medications to improve QOL.  PT Short Term Goal 5 - Progress: Met PT Long Term Goals Time to Complete Long Term Goals:  (6 weeks) PT  Long Term Goal 1: Pt will improve his core strength to [facility] in order to tolerate standing for greater than 30 minutes to continue with light house work.  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  PT Long Term Goal 2 - Progress: Met Long Term Goal 3: Pt will improve his FOTO to limiation less than 38% for improved percieved functional ability.   Problem List Patient Active Problem List   Diagnosis Date Noted  . Lumbar stenosis with neurogenic claudication 11/07/2013  . Sacroiliac joint dysfunction 11/07/2013  . HTN (hypertension), benign 10/24/2013  . Spinal stenosis, lumbar region, with neurogenic claudication 06/20/2013  . Shortness of breath 03/23/2012  . COPD (chronic obstructive pulmonary disease) 03/23/2012  . Mixed hyperlipidemia 03/23/2012    PT - End of Session Activity Tolerance: Patient tolerated treatment well General Behavior During Therapy: WFL for tasks assessed/performed PT Plan of Care PT Home Exercise Plan: updated with postural strengthening PT Patient Instructions: proper lifting techniques, use of green theraband, FOTO and d/c plans to continue with HEP Consulted and Agree with Plan of Care: Patient  GP Functional Assessment Tool Used: FOTO: 75/25 Functional Limitation: Other PT primary Other PT Primary Goal Status ([id number]): At least 20 percent but less than 40 percent impaired, limited or restricted Other PT Primary Discharge Status [phone]): At least 20 percent but less than 40 percent impaired, limited or restricted  Ronte Parker, MPT, ATC 11/17/2013, 2:42 PM  Physician Documentation Your signature is required to indicate approval of the treatment plan as stated above.  Please sign and either send electronically or make a copy of this report for your files and return this physician signed original.   Please mark  one 1.__approve of plan  2. ___approve of plan with the following conditions.   ______________________________                                                          _____________________ Physician Signature                                                                                                             Date

## 2013-11-20 ENCOUNTER — Ambulatory Visit (HOSPITAL_COMMUNITY): Payer: Medicare Other | Admitting: Physical Therapy

## 2013-11-22 ENCOUNTER — Ambulatory Visit (HOSPITAL_COMMUNITY): Payer: Medicare Other

## 2013-11-24 ENCOUNTER — Ambulatory Visit (HOSPITAL_COMMUNITY): Payer: Medicare Other | Admitting: Physical Therapy

## 2013-11-27 ENCOUNTER — Ambulatory Visit (HOSPITAL_COMMUNITY): Payer: Medicare Other | Admitting: Physical Therapy

## 2013-11-29 ENCOUNTER — Ambulatory Visit (HOSPITAL_COMMUNITY): Payer: Medicare Other | Admitting: Physical Therapy

## 2013-12-05 ENCOUNTER — Telehealth: Payer: Self-pay | Admitting: Family Medicine

## 2013-12-05 NOTE — Telephone Encounter (Signed)
See attached note to chart for BP checks over past weeks

## 2013-12-06 NOTE — Telephone Encounter (Signed)
Please spell [name] that I reviewed over his blood pressure results. Several of the most recent readings are higher than what I would like to see. I would recommend increasing amlodipine from 5 mg to 10 mg. In addition to this ideally we would like to number to be 1:30 or less in the bottom number to be in the mid-80s or less. I would recommend that he followup with [place] again in 6 weeks' time to go over how his blood pressure is doing. Thanks.

## 2013-12-07 ENCOUNTER — Other Ambulatory Visit: Payer: Self-pay | Admitting: *Deleted

## 2013-12-07 ENCOUNTER — Telehealth: Payer: Self-pay | Admitting: Family Medicine

## 2013-12-07 ENCOUNTER — Telehealth: Payer: Self-pay | Admitting: *Deleted

## 2013-12-07 DIAGNOSIS — E119 Type 2 diabetes mellitus without complications: Secondary | ICD-10-CM

## 2013-12-07 DIAGNOSIS — Z125 Encounter for screening for malignant neoplasm of prostate: Secondary | ICD-10-CM

## 2013-12-07 MED ORDER — AMLODIPINE BESYLATE 10 MG PO TABS: 10.0000 mg | ORAL_TABLET | Freq: Every day | ORAL | Status: DC

## 2013-12-07 NOTE — Telephone Encounter (Signed)
Had lipid, liver and met 7 done on 10/16/13. Does he need to repeat this soon?

## 2013-12-07 NOTE — Telephone Encounter (Signed)
Thanks, I would not recommend lipid or liver profile at this point, it patient asked let him know that this would be looked at again in late spring.

## 2013-12-07 NOTE — Telephone Encounter (Signed)
He may well need a PSA. If this has not been done in the past 11 months please get it done. Also do a hemoglobin A1c with this.

## 2013-12-07 NOTE — Telephone Encounter (Signed)
Patient has appointment for physical on the 2/16 needing blood work papers wanting to get it done on 2/9.

## 2013-12-07 NOTE — Telephone Encounter (Signed)
Discussed with patient. He made office visit. rx for amlodipine 10mg  [facility]

## 2013-12-07 NOTE — Telephone Encounter (Addendum)
Do not see PSA in last year in Epic so patient needs PSA and HgbA1c - Does patient need the Lipid and Liver repeated if he just had done in Dec 14

## 2013-12-08 NOTE — Addendum Note (Signed)
Addended by: Dairl Ponder on: 12/08/2013 12:05 PM   Modules accepted: Orders

## 2013-12-08 NOTE — Telephone Encounter (Signed)
Left message on voicemail notifying patient orders are ready.

## 2013-12-18 DIAGNOSIS — Z125 Encounter for screening for malignant neoplasm of prostate: Secondary | ICD-10-CM | POA: Diagnosis not present

## 2013-12-18 DIAGNOSIS — E119 Type 2 diabetes mellitus without complications: Secondary | ICD-10-CM | POA: Diagnosis not present

## 2013-12-18 LAB — HEMOGLOBIN A1C
Hgb A1c MFr Bld: 6.1 % — ABNORMAL HIGH (ref <5.7)
Mean Plasma Glucose: 128 mg/dL — ABNORMAL HIGH (ref <117)

## 2013-12-19 LAB — PSA: PSA: 1.3 ng/mL (ref <=4.00)

## 2013-12-25 ENCOUNTER — Encounter: Payer: Medicare Other | Admitting: Family Medicine

## 2014-01-01 ENCOUNTER — Encounter: Payer: Self-pay | Admitting: Family Medicine

## 2014-01-01 ENCOUNTER — Ambulatory Visit (INDEPENDENT_AMBULATORY_CARE_PROVIDER_SITE_OTHER): Payer: Medicare Other | Admitting: Family Medicine

## 2014-01-01 VITALS — BP 128/84 | Ht 66.0 in | Wt 208.0 lb

## 2014-01-01 DIAGNOSIS — Z0000 Encounter for general adult medical examination without abnormal findings: Secondary | ICD-10-CM | POA: Diagnosis not present

## 2014-01-01 DIAGNOSIS — J449 Chronic obstructive pulmonary disease, unspecified: Secondary | ICD-10-CM | POA: Diagnosis not present

## 2014-01-01 NOTE — Progress Notes (Signed)
   Subjective:    Patient ID: Miguel Morales, male    DOB: 09/24/1946, 68 y.o.   MRN: [id number]  HPI AWV- Annual Wellness Visit  The patient was seen for their annual wellness visit. The patient's past medical history, surgical history, and family history were reviewed. Pertinent vaccines were reviewed ( tetanus, pneumonia, shingles, flu) The patient's medication list was reviewed and updated. The height and weight were entered. The patient's current BMI is:  Cognitive screening was completed. Outcome of Mini - YPP:[id number]  Falls within the past 6 months: none Current tobacco usage: none (All patients who use tobacco were given written and verbal information on quitting)  Recent listing of emergency department/hospitalizations over the past year were reviewed.  current specialist the patient sees on a regular basis: No ER/Hospital. Dr Ellene Route- for spinal stenosis  Medicare annual wellness visit patient questionnaire was reviewed.  A written screening schedule for the patient for the next 5-10 years was given. Appropriate discussion of followup regarding next visit was discussed.   Patient relates that he is trying to get disability through the government for his exposure to toxic smoke while in [place] he states he does not feel a smoking was a major contributor to his problem. He no longer smokes.  Review of Systems Patient denies any chest tightness pressure pain shortness breath nausea vomiting diarrhea. He does relate shortness of breath with activity    Objective:   Physical Exam  Lungs clear hearts regular pulse normal abdomen soft prostate exam normal      Assessment & Plan:  Patient is up-to-date on colonoscopy. Will need repeat in 2020  He is up-to-date on all immunizations  Patient with COPD he no longer smokes. He had severe exposure to excessive smoke and fumes when [place]. He relates he did not have problems with his breathing until after  that.  Referral to pulmonology

## 2014-01-05 ENCOUNTER — Telehealth: Payer: Self-pay | Admitting: Family Medicine

## 2014-01-05 NOTE — Telephone Encounter (Signed)
LMOM letting him know that letter would be ready on Monday.

## 2014-01-05 NOTE — Telephone Encounter (Signed)
Patient calling to see if the letter that Dr Nicki Reaper was going to write for him was done.

## 2014-01-05 NOTE — Telephone Encounter (Signed)
Let pt know letter done

## 2014-01-05 NOTE — Telephone Encounter (Signed)
Letter done

## 2014-01-11 ENCOUNTER — Encounter: Payer: Self-pay | Admitting: Internal Medicine

## 2014-01-11 ENCOUNTER — Ambulatory Visit (INDEPENDENT_AMBULATORY_CARE_PROVIDER_SITE_OTHER): Payer: Medicare Other | Admitting: Internal Medicine

## 2014-01-11 VITALS — BP 120/78 | HR 87 | Ht 66.0 in | Wt 201.6 lb

## 2014-01-11 DIAGNOSIS — R0602 Shortness of breath: Secondary | ICD-10-CM

## 2014-01-11 DIAGNOSIS — Z129 Encounter for screening for malignant neoplasm, site unspecified: Secondary | ICD-10-CM

## 2014-01-11 DIAGNOSIS — J449 Chronic obstructive pulmonary disease, unspecified: Secondary | ICD-10-CM | POA: Diagnosis not present

## 2014-01-11 NOTE — Progress Notes (Signed)
Subjective:    Patient ID: Miguel Morales, male    DOB: 09-09-1946, 68 y.o.   MRN: [id number] PCP Sallee Lange, MD  HPI  IOV 01/11/2014  Chief Complaint  Patient presents with  . Pulmonary Consult    pt states he was diagnosed with COPD back in 2011 by Dr. Wolfgang Phoenix. He states his breathing has not been any better since then so wants to f/u with a speacial    68 year old male with a 30 pack smoking history. He is a veteran of the first [place] in [place]. He is here for dyspnea evaluation. He's also plan for disability through the [place].  In 1991 while in the first [place] he was exposed to the burning oil fields of [place] for at least several  months between January 1991 in May 1991. At that time he was in his 25s and he was still smoking. Subsequently after return to the [place] sometime in the mid 90s he developed insidious onset of shortness of breath with exertion and relieved by rest. Since then dyspnea is been slowly progressive despite quitting smoking around 2004. In 2011 he had a common function test at [facility] for which I do not have the full results but apparently was told that he had COPD. In the last 5-10 years these have progressively more dyspnea and exertion. Currently is on Spiriva for maintenance along with Combivent twice a day for maintenance [note he takes 2 anticholinergics for maintenance]. He notices dyspnea for climbing 1 flight of stairs or doing yard work or using a Systems developer of food at [employer]. The last 5-10 years is also gained 10 pounds of weight. He says that several years ago he could do these tasks without a problem he did is now sustained cough or chest pain or edema.  Walking desaturation test on room air today 133feet x3 laps: DID NOT DESATURATE bELOW 94%  Relevant investigations include below  CT sinus March 2004:SCATTERED OPACIFICATION IN THE ETHMOID SINUSES ALONG WITH MUCOPERIOSTEAL THICKENING IN THE LEFT MAXILLARY  SINUS PFT 2011 - at : results not available  May 2013 Clear wihtout infiltrates. COPD + May 13: Normal nuclear medicine Myoview of the heart June 2013: Stress Myoview EKG is normal per report   reports that he quit smoking about 10 years ago. His smoking use included Cigarettes. He has a 30 pack-year smoking history. He has never used smokeless tobacco.   Past Medical History  Diagnosis Date  . COPD (chronic obstructive pulmonary disease)   . Mixed hyperlipidemia   . GERD (gastroesophageal reflux disease)   . Cancer   . Asthma      Family History  Problem Relation Age of Onset  . Cancer Mother      History   Social History  . Marital Status: Married    Spouse Name: N/A    Number of Children: N/A  . Years of Education: N/A   Occupational History  . Not on file.   Social History Main Topics  . Smoking status: Former Smoker -- 1.00 packs/day for 30 years    Types: Cigarettes    Quit date: 11/10/2003  . Smokeless tobacco: Never Used  . Alcohol Use: Yes     Comment: Occasionally  . Drug Use: No  . Sexual Activity: Not on file   Other Topics Concern  . Not on file   Social History Narrative  . No narrative on file     Allergies  Allergen Reactions  . Atorvastatin     Had severe arthralgia with all, can not tolerate statins  . Sulfa Antibiotics      Outpatient Prescriptions Prior to Visit  Medication Sig Dispense Refill  . albuterol-ipratropium (COMBIVENT) 18-103 MCG/ACT inhaler Inhale 2 puffs into the lungs every 6 (six) hours as needed for wheezing.      Marland Kitchen amLODipine (NORVASC) 10 MG tablet Take 10 mg by mouth every other day.      . Ascorbic Acid (VITAMIN C) 1000 MG tablet Take 1,000 mg by mouth daily.      Marland Kitchen aspirin 81 MG tablet Take 81 mg by mouth daily.      Marland Kitchen atorvastatin (LIPITOR) 20 MG tablet Take 20 mg by mouth every other day.      . Coenzyme Q10 (COQ10) 400 MG CAPS Take by mouth daily.      . fluticasone (FLONASE) 50 MCG/ACT nasal spray  Place 1 spray into both nostrils daily.  16 g  1  . omeprazole (PRILOSEC) 20 MG capsule TAKE 1 CAPSULE DAILY  90 capsule  2  . tiotropium (SPIRIVA HANDIHALER) 18 MCG inhalation capsule Place 1 capsule (18 mcg total) into inhaler and inhale 1 day or 1 dose.  90 capsule  3  . traMADol-acetaminophen (ULTRACET) 37.5-325 MG per tablet Take 1 tablet by mouth every 4 (four) hours as needed for pain.  90 tablet  5  . fluticasone (VERAMYST) 27.5 MCG/SPRAY nasal spray Place 2 sprays into the nose daily.      . Loratadine 10 MG CAPS Take by mouth daily. PRN      . methocarbamol (ROBAXIN) 500 MG tablet Take 500 mg by mouth 4 (four) times daily.      . naproxen (NAPROSYN) 500 MG tablet Take 500 mg by mouth 2 (two) times daily with a meal.       No facility-administered medications prior to visit.      Review of Systems  Constitutional: Negative for fever and unexpected weight change.  HENT: Positive for congestion. Negative for dental problem, ear pain, nosebleeds, postnasal drip, rhinorrhea, sinus pressure, sneezing, sore throat and trouble swallowing.   Eyes: Negative for redness and itching.  Respiratory: Positive for cough and shortness of breath. Negative for chest tightness and wheezing.   Cardiovascular: Negative for palpitations and leg swelling.  Gastrointestinal: Negative for nausea and vomiting.  Genitourinary: Negative for dysuria.  Musculoskeletal: Negative for joint swelling.  Skin: Negative for rash.  Neurological: Negative for headaches.  Hematological: Does not bruise/bleed easily.  Psychiatric/Behavioral: Negative for dysphoric mood. The patient is not nervous/anxious.        Objective:   Physical Exam  Nursing note and vitals reviewed. Constitutional: He is oriented to person, place, and time. He appears well-developed and well-nourished. No distress.  Body mass index is 32.55 kg/(m^2).   HENT:  Head: Normocephalic and atraumatic.  Right Ear: External ear normal.  Left  Ear: External ear normal.  Mouth/Throat: Oropharynx is clear and moist. No oropharyngeal exudate.  Eyes: Conjunctivae and EOM are normal. Pupils are equal, round, and reactive to light. Right eye exhibits no discharge. Left eye exhibits no discharge. No scleral icterus.  Neck: Normal range of motion. Neck supple. No JVD present. No tracheal deviation present. No thyromegaly present.  Cardiovascular: Normal rate, regular rhythm and intact distal pulses.  Exam reveals no gallop and no friction rub.   No murmur heard. Pulmonary/Chest: Effort normal and breath sounds normal. No respiratory distress. He has no  wheezes. He has no rales. He exhibits no tenderness.  Abdominal: Soft. Bowel sounds are normal. He exhibits no distension and no mass. There is no tenderness. There is no rebound and no guarding.  Visceral obesity +  Musculoskeletal: Normal range of motion. He exhibits no edema and no tenderness.  Lymphadenopathy:    He has no cervical adenopathy.  Neurological: He is alert and oriented to person, place, and time. He has normal reflexes. No cranial nerve deficit. Coordination normal.  Skin: Skin is warm and dry. No rash noted. He is not diaphoretic. No erythema. No pallor.  Psychiatric: He has a normal mood and affect. His behavior is normal. Judgment and thought content normal.          Assessment & Plan:

## 2014-01-11 NOTE — Patient Instructions (Signed)
Shortness of breath  - need to get to bottom of why you are short of breath  - do walk test on Room air for oxygen level  - do full PFT test asap next several days   Followup retrun to see me or my NP after PFT Depending on results, will need CT chest or CPST bike test

## 2014-01-13 ENCOUNTER — Encounter: Payer: Self-pay | Admitting: Internal Medicine

## 2014-01-13 DIAGNOSIS — Z129 Encounter for screening for malignant neoplasm, site unspecified: Secondary | ICD-10-CM | POA: Insufficient documentation

## 2014-01-13 NOTE — Assessment & Plan Note (Addendum)
Shortness of breath  He feels dyspnea is due to copd which in turn was accelerated by burning oil well exposure in [place]. Certainly toxic fume exposure at sustained levels can accelerate worsning lung function. At this point, while I have explained to him that possibility I have emphasized FIRST we need to sort out why he is dyspneic? Is it from obesity, v lung disesae, v deconditioning v diastolic dysfunction  OF note, he did not desaturate with exertion  PLAN  -  do full PFT test asap next several days   Followup retrun to see me or my NP after PFT Depending on results, will need CT chest or CPST bike test

## 2014-01-13 NOTE — Assessment & Plan Note (Signed)
He is eligible for medicare based LDCT for lung cancer screen. Will set this up in future after discussing with ihm

## 2014-01-15 DIAGNOSIS — Z0289 Encounter for other administrative examinations: Secondary | ICD-10-CM

## 2014-01-16 ENCOUNTER — Ambulatory Visit (HOSPITAL_COMMUNITY)
Admission: RE | Admit: 2014-01-16 | Discharge: 2014-01-16 | Disposition: A | Discharge status: Home / Self Care | Payer: Medicare Other | Source: Ambulatory Visit | Attending: Internal Medicine | Admitting: Internal Medicine

## 2014-01-16 DIAGNOSIS — J4489 Other specified chronic obstructive pulmonary disease: Secondary | ICD-10-CM | POA: Insufficient documentation

## 2014-01-16 DIAGNOSIS — J449 Chronic obstructive pulmonary disease, unspecified: Secondary | ICD-10-CM | POA: Diagnosis not present

## 2014-01-16 MED ORDER — ALBUTEROL SULFATE (2.5 MG/3ML) 0.083% IN NEBU
2.5000 mg | INHALATION_SOLUTION | Freq: Once | RESPIRATORY_TRACT | Status: AC
Start: 2014-01-16 — End: 2014-01-16
  Administered 2014-01-16: 2.5 mg via RESPIRATORY_TRACT

## 2014-01-17 LAB — PULMONARY FUNCTION TEST
DL/VA % pred: 78 %
DL/VA: 3.41 ml/min/mmHg/L
DLCO COR % PRED: 68 %
DLCO COR: 18.33 ml/min/mmHg
DLCO UNC: 18.33 ml/min/mmHg
DLCO unc % pred: 68 %
FEF 25-75 POST: 1.84 L/s
FEF 25-75 Pre: 1.43 L/sec
FEF2575-%Change-Post: 28 %
FEF2575-%Pred-Post: 86 %
FEF2575-%Pred-Pre: 67 %
FEV1-%Change-Post: 6 %
FEV1-%Pred-Post: 88 %
FEV1-%Pred-Pre: 83 %
FEV1-POST: 2.45 L
FEV1-Pre: 2.31 L
FEV1FVC-%CHANGE-POST: 1 %
FEV1FVC-%Pred-Pre: 94 %
FEV6-%CHANGE-POST: 4 %
FEV6-%PRED-POST: 94 %
FEV6-%PRED-PRE: 91 %
FEV6-Post: 3.37 L
FEV6-Pre: 3.24 L
FEV6FVC-%Change-Post: 0 %
FEV6FVC-%PRED-POST: 104 %
FEV6FVC-%Pred-Pre: 104 %
FVC-%CHANGE-POST: 4 %
FVC-%PRED-POST: 90 %
FVC-%Pred-Pre: 87 %
FVC-POST: 3.42 L
FVC-PRE: 3.29 L
POST FEV6/FVC RATIO: 98 %
PRE FEV1/FVC RATIO: 70 %
Post FEV1/FVC ratio: 72 %
Pre FEV6/FVC Ratio: 98 %
RV % pred: 128 %
RV: 2.85 L
TLC % pred: 102 %
TLC: 6.41 L

## 2014-01-18 ENCOUNTER — Ambulatory Visit (INDEPENDENT_AMBULATORY_CARE_PROVIDER_SITE_OTHER)
Admission: RE | Admit: 2014-01-18 | Discharge: 2014-01-18 | Disposition: A | Discharge status: Home / Self Care | Payer: Medicare Other | Source: Ambulatory Visit | Attending: Internal Medicine | Admitting: Internal Medicine

## 2014-01-18 ENCOUNTER — Other Ambulatory Visit: Payer: Self-pay | Admitting: Family Medicine

## 2014-01-18 ENCOUNTER — Encounter: Payer: Self-pay | Admitting: Adult Health

## 2014-01-18 ENCOUNTER — Ambulatory Visit (INDEPENDENT_AMBULATORY_CARE_PROVIDER_SITE_OTHER): Payer: Medicare Other | Admitting: Adult Health

## 2014-01-18 VITALS — BP 136/84 | HR 102 | Temp 98.8°F | Ht 66.0 in | Wt 207.0 lb

## 2014-01-18 DIAGNOSIS — J449 Chronic obstructive pulmonary disease, unspecified: Secondary | ICD-10-CM | POA: Diagnosis not present

## 2014-01-18 DIAGNOSIS — R0602 Shortness of breath: Secondary | ICD-10-CM | POA: Diagnosis not present

## 2014-01-18 NOTE — Patient Instructions (Addendum)
May use  Claritin 10mg  At bedtime  As needed  Drainage  May use Veramyst nasal for nasal congesiton  I will call regarding CT chest  follow up Dr. Chase Caller in 6 weeks and As needed     Late add : set up HR CT to look for ILD

## 2014-01-18 NOTE — Progress Notes (Signed)
Subjective:    Patient ID: Miguel Morales, male    DOB: 1946/06/28, 68 y.o.   MRN: [id number] PCP Sallee Lange, MD  HPI  IOV 01/11/2014 Chief Complaint  Patient presents with  . Pulmonary Consult    pt states he was diagnosed with COPD back in 2011 by Dr. Wolfgang Phoenix. He states his breathing has not been any better since then so wants to f/u with a speacial    68 year old male with a 30 pack smoking history. He is a veteran of the first [place] in [place]. He is here for dyspnea evaluation. He's also plan for disability through the [place].  In 1991 while in the first [place] he was exposed to the burning oil [place] of [place] for at least several  months between January 1991 in May 1991. At that time he was in his 12s and he was still smoking. Subsequently after return to the [place] sometime in the mid 90s he developed insidious onset of shortness of breath with exertion and relieved by rest. Since then dyspnea is been slowly progressive despite quitting smoking around 2004. In 2011 he had a common function test at [facility] for which I do not have the full results but apparently was told that he had COPD. In the last 5-10 years these have progressively more dyspnea and exertion. Currently is on Spiriva for maintenance along with Combivent twice a day for maintenance [note he takes 2 anticholinergics for maintenance]. He notices dyspnea for climbing 1 flight of stairs or doing yard work or using a Systems developer of food at [employer]. The last 5-10 years is also gained 10 pounds of weight. He says that several years ago he could do these tasks without a problem he did is now sustained cough or chest pain or edema.  Walking desaturation test on room air today 174feet x3 laps: DID NOT DESATURATE bELOW 94%  Relevant investigations include below  CT sinus March 2004:SCATTERED OPACIFICATION IN THE ETHMOID SINUSES ALONG WITH MUCOPERIOSTEAL THICKENING IN THE LEFT MAXILLARY SINUS PFT  2011 - at [place]: results not available  May 2013 Clear wihtout infiltrates. COPD + May 13: Normal nuclear medicine Myoview of the heart June 2013: Stress Myoview EKG is normal per report   reports that he quit smoking about 10 years ago. His smoking use included Cigarettes. He has a 30 pack-year smoking history. He has never used smokeless tobacco. >>set up pft    01/18/2014 Follow up w/ PFT  Patient returns for a followup visit to review to pulmonary  function test results.  PFTs (s/p BD) showed an FEV1 2.45 L, 88%, ratio of 72 FVC 90%, no significant bronchodilator response. Mid-flows diminished at 67%, with 28% , change, status post bronchodilator. Diffusing capacity, diminished at 68% No signifcant change in symptoms. Has  SOB with mild activity, and dry cough x years. Wears out easily, gives out of stamina.  Denies any chest pain, hemoptysis, orthopnea, PND, or leg swelling Does have quite a bit of postnasal drip, and drainage     Review of Systems  Constitutional:   No  weight loss, night sweats,  Fevers, chills, fatigue, or  lassitude.  HEENT:   No headaches,  Difficulty swallowing,  Tooth/dental problems, or  Sore throat,                No sneezing, itching, ear ache, nasal congestion, post nasal drip,   CV:  No chest pain,  Orthopnea, PND, swelling  in lower extremities, anasarca, dizziness, palpitations, syncope.   GI  No heartburn, indigestion, abdominal pain, nausea, vomiting, diarrhea, change in bowel habits, loss of appetite, bloody stools.   Resp:.  No chest wall deformity  Skin: no rash or lesions.  GU: no dysuria, change in color of urine, no urgency or frequency.  No flank pain, no hematuria   MS:  No joint pain or swelling.  No decreased range of motion.  No back pain.  Psych:  No change in mood or affect. No depression or anxiety.  No memory loss.          Objective:   Physical Exam  GEN: A/Ox3; pleasant , NAD, eldelry   HEENT:  [place]/AT,   EACs-clear, TMs-wnl, NOSE-clear, THROAT-clear, no lesions, no postnasal drip or exudate noted.   NECK:  Supple w/ fair ROM; no JVD; normal carotid impulses w/o bruits; no thyromegaly or nodules palpated; no lymphadenopathy.  RESP  Diminished BS in bases , no accessory muscle use, no dullness to percussion  CARD:  RRR, no m/r/g  , no peripheral edema, pulses intact, no cyanosis or clubbing.  GI:   Soft & nt; nml bowel sounds; no organomegaly or masses detected.  Musco: Warm bil, no deformities or joint swelling noted.   Neuro: alert, no focal deficits noted.    Skin: Warm, no lesions or rashes        Assessment & Plan:

## 2014-01-19 MED ORDER — FLUTICASONE PROPIONATE 50 MCG/ACT NA SUSP: 1.0000 | Freq: Every day | NASAL | Status: DC | PRN

## 2014-01-19 NOTE — Assessment & Plan Note (Addendum)
Dyspnea /DOE ? Etiology  PFT shows no airflow obstruction or restriction . Diffusing capacity is diminished at 68%.  Mid flows decreased at 67% w/ BD responsiveness ? RAD  Reviewed PFT with Dr. Chase Caller and pt to be set up CT chest -HR to r/o ILD w/ low DLCO .  Will tx rhinitis triggers,   Plan  May use  Claritin 10mg  At bedtime  As needed  Drainage  May use Veramyst nasal for nasal congesiton  Set up HR CT  follow up Dr. Chase Caller in 6 weeks and As needed

## 2014-01-23 ENCOUNTER — Telehealth: Payer: Self-pay | Admitting: Internal Medicine

## 2014-01-23 NOTE — Telephone Encounter (Signed)
Notes Recorded by Deneise Lever, MD on 01/18/2014 at 8:37 PM CXR- COPD changes, with no acute process seen.   I spoke with patient about results and he verbalized understanding and had no questions

## 2014-01-24 ENCOUNTER — Telehealth: Payer: Self-pay | Admitting: Internal Medicine

## 2014-01-24 DIAGNOSIS — R0602 Shortness of breath: Secondary | ICD-10-CM

## 2014-01-24 NOTE — Telephone Encounter (Signed)
tammy with PFTs showing isolated low dlco, please order HRCT chest. You can type and search for high resolution but in instruction you have to fill in "High Resolution CT chest without contrast on ILD protocol. Only  Dr Lorin Picket or Dr. Vinnie Langton to read"  THanks  M

## 2014-01-25 NOTE — Telephone Encounter (Signed)
LMOM TCB x1 to discuss with patient MR's recs for CT and follow up.

## 2014-01-25 NOTE — Telephone Encounter (Signed)
Jess can you set this up please with a follow up ov with Dr. Chase Caller  Thanks so much

## 2014-01-29 NOTE — Telephone Encounter (Signed)
Called spoke with patient, advised of MR's recs as below.  Pt okay with the HRCT but requests a Tues, Thur, or Friday.  Order placed.

## 2014-01-29 NOTE — Telephone Encounter (Signed)
Pt is returning call & can be reached at [phone].  Miguel Morales

## 2014-02-06 ENCOUNTER — Ambulatory Visit (INDEPENDENT_AMBULATORY_CARE_PROVIDER_SITE_OTHER)
Admission: RE | Admit: 2014-02-06 | Discharge: 2014-02-06 | Disposition: A | Discharge status: Home / Self Care | Payer: Medicare Other | Source: Ambulatory Visit | Attending: Internal Medicine | Admitting: Internal Medicine

## 2014-02-06 DIAGNOSIS — R0602 Shortness of breath: Secondary | ICD-10-CM | POA: Diagnosis not present

## 2014-02-06 DIAGNOSIS — J438 Other emphysema: Secondary | ICD-10-CM | POA: Diagnosis not present

## 2014-02-08 ENCOUNTER — Encounter: Payer: Self-pay | Admitting: Internal Medicine

## 2014-02-08 ENCOUNTER — Ambulatory Visit (INDEPENDENT_AMBULATORY_CARE_PROVIDER_SITE_OTHER): Payer: Medicare Other | Admitting: Internal Medicine

## 2014-02-08 VITALS — BP 126/72 | HR 85 | Ht 66.0 in | Wt 201.7 lb

## 2014-02-08 DIAGNOSIS — R0602 Shortness of breath: Secondary | ICD-10-CM | POA: Diagnosis not present

## 2014-02-08 NOTE — Progress Notes (Signed)
Subjective:    Patient ID: Miguel Morales, male    DOB: 1946-11-03, 68 y.o.   MRN: [id number]  HPI  PCP Sallee Lange, MD  HPI  IOV 01/11/2014 Chief Complaint  Patient presents with  . Pulmonary Consult    pt states he was diagnosed with COPD back in 2011 by Dr. Wolfgang Phoenix. He states his breathing has not been any better since then so wants to f/u with a speacial    68 year old male with a 30 pack smoking history. 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 He is here for dyspnea evaluation. He's also plan for disability through the [place].  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 At that time he was in his 67s and he was still smoking. Subsequently after return to the [place] sometime in the mid 90s he developed insidious onset of shortness of breath with exertion and relieved by rest. Since then dyspnea is been slowly progressive despite quitting smoking around 2004. In 2011 he had a pulmonary function test at [facility] for which I do not have the full results but apparently was told that he had COPD. In the last 5-10 years these have progressively more dyspnea and exertion. Currently is on Spiriva for maintenance along with Combivent twice a day for maintenance [note he takes 2 anticholinergics for maintenance]. He notices dyspnea for climbing 1 flight of stairs or doing yard work or using a chainsaw or lifting boxes 10# boxes  food at [employer]. The last 5-10 years is also gained 10 pounds of weight. He says that several years ago he could do these tasks without a problem he did is now sustained cough or chest pain or edema.   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 COPD + May 13: Normal nuclear medicine Myoview of the heart June 2013: Stress Myoview EKG is normal per report   reports that he quit smoking about 10 years ago. His smoking use included Cigarettes. He has a 30 pack-year smoking history. He has never used smokeless tobacco. >>set up pft    01/18/2014 Follow up w/ PFT  Patient returns for a followup visit to review to pulmonary  function test results. PFTs (s/p BD) showed an FEV1 2.45 L, 88%, ratio of 72;  FVC 90%, no significant bronchodilator response. Mid-flows diminished at 67%, with 28% , change, status post bronchodilator.  Diffusing capacity, diminished at 68% No signifcant change in symptoms. Has  SOB with mild activity, and dry cough x years. Wears out easily, gives out of stamina.  Denies any chest pain, hemoptysis, orthopnea, PND, or leg swelling Does have quite a bit of postnasal drip, and drainage  REC May use  Claritin 10mg  At bedtime  As needed  Drainage  May use Veramyst nasal for nasal congesiton  I will call regarding CT chest  follow up Dr. Chase Caller in 6 weeks and As  needed     Late add : set up HR CT to look for ILD   OV 02/08/2014  Chief Complaint  Patient presents with  . Follow-up    Review CT chest done on tuesday.  Pt c/o SOB with exertion.  Denies chest congestion, tightness, wheezing.     Followup dyspnea workup in previous smoker and ex [place] War veteran.  PFT  test shows isolated reduction in diffusion capacity. High resolution CT scan of the chest done recently has no official report because the thoracic radiologist is on vacation. However to me it appears that there might be some supple bibasal right greater than left early reticular pattern  of interstitial lung disease without any specific honeycombing. Nevertheless, his dyspnea is out of proportion to his pulmonary  function and radiological abnormalities  In addition today he claims he was exposed to asbestos during the time in [place] in the 1960s;  exposed to the brake lining for 7 months    has a past medical history of COPD (chronic obstructive pulmonary disease); Mixed hyperlipidemia; GERD (gastroesophageal reflux disease); Cancer; and Asthma.   Review of Systems  Constitutional: Negative for fever and unexpected weight change.  HENT: Positive for postnasal drip. Negative for congestion, dental problem, ear pain, nosebleeds, rhinorrhea, sinus pressure, sneezing, sore throat and trouble swallowing.   Eyes: Negative for redness and itching.  Respiratory: Positive for shortness of breath. Negative for cough, chest tightness and wheezing.   Cardiovascular: Negative for palpitations and leg swelling.  Gastrointestinal: Negative for nausea and vomiting.  Genitourinary: Negative for dysuria.  Musculoskeletal: Negative for joint swelling.  Skin: Negative for rash.  Neurological: Negative for headaches.  Hematological: Does not bruise/bleed easily.  Psychiatric/Behavioral: Negative for dysphoric mood. The patient is not nervous/anxious.        Objective:   Physical Exam   Filed Vitals:   02/08/14 1121  Height: 5\' 6"  (1.676 m)  Weight: 201 lb 11.2 oz (91.491 kg)    Filed Vitals:   02/08/14 1121  BP: 126/72  Pulse: 85  Height: 5\' 6"  (1.676 m)  Weight: 201 lb 11.2 oz (91.491 kg)  SpO2: 97%    Discussion only visit     essment & Plan:

## 2014-02-08 NOTE — Patient Instructions (Signed)
#  Shortness of breath   - could be due to possible lung abnormality, weight, fitness issues, and other unclear causes  - to sort this out have CPST bike test with EIB challenge with Mr Samule Dry   #Lung function abnromality  - PFT shows some mild impairment with efficiency of oxygen transfer from the lung to the bloodstream - On CT scan of the chest there might be scar tissue in the lung but I'm not so sure. We will wait on the radiologist's official report - Based on the radiologist's impression U. might need additional scan or a surgical lung biopsy  #Followup  After  CPST bike test

## 2014-02-14 ENCOUNTER — Ambulatory Visit (HOSPITAL_COMMUNITY): Payer: Medicare Other | Attending: Internal Medicine

## 2014-02-14 ENCOUNTER — Other Ambulatory Visit: Payer: Self-pay | Admitting: Family Medicine

## 2014-02-14 DIAGNOSIS — R0602 Shortness of breath: Secondary | ICD-10-CM

## 2014-02-16 ENCOUNTER — Other Ambulatory Visit: Payer: Self-pay | Admitting: Family Medicine

## 2014-02-16 ENCOUNTER — Telehealth: Payer: Self-pay | Admitting: Internal Medicine

## 2014-02-16 DIAGNOSIS — J449 Chronic obstructive pulmonary disease, unspecified: Secondary | ICD-10-CM

## 2014-02-16 DIAGNOSIS — R911 Solitary pulmonary nodule: Secondary | ICD-10-CM

## 2014-02-16 NOTE — Telephone Encounter (Signed)
Pt had CPST 02/14/14. Pt aware MR has been out of the office and will call once results are available.  Please advise MR thanks

## 2014-02-18 ENCOUNTER — Telehealth: Payer: Self-pay | Admitting: Internal Medicine

## 2014-02-18 NOTE — Telephone Encounter (Signed)
CT chest shows, after official radiology read  1. Upper lobe nodule - needs repeat CT chest wo contrast High Resolution CT chest without contrast on ILD protocol. Only  Dr Lorin Picket or Dr. Vinnie Langton to read  In 3 months. Please orders  2. No ILD as I had thought  3. Coronary artery calcification - refer cardiology  4. fpr dyspnea: ensure he has CPST and come back to see me   Dr. Brand Males, M.D., [facility].C.P Pulmonary and Critical Care Medicine Staff Physician [facility] Pager: [phone], If no answer or between  15:00h - 7:00h: call [phone]  02/18/2014 3:48 PM

## 2014-02-18 NOTE — Assessment & Plan Note (Signed)
#  Shortness of breath   - could be due to possible lung abnormality, weight, fitness issues, and other unclear causes  - to sort this out have CPST bike test with EIB challenge with Mr Samule Dry   #Lung function abnromality  - PFT shows some mild impairment with efficiency of oxygen transfer from the lung to the bloodstream - On CT scan of the chest there might be scar tissue in the lung but I'm not so sure. We will wait on the radiologist's official report - Based on the radiologist's impression U. might need additional scan or a surgical lung biopsy  #Followup  After  CPST bike test  > 50% of this > 25 min visit spent in face to face counseling (15 min visit converted to 25 min)

## 2014-02-18 NOTE — Telephone Encounter (Signed)
See other telephone note sent to [place] [facility]; did not know about this. He needs to see cards due to coronary artrery calcification and then see me. HIs stress test results are not posted yet

## 2014-02-19 NOTE — Telephone Encounter (Signed)
Pt has returned call. Please call cell # [phone].

## 2014-02-19 NOTE — Telephone Encounter (Signed)
Brand Males, MD at 02/18/2014 3:47 PM     Status: Signed        CT chest shows, after official radiology read  1. Upper lobe nodule - needs repeat CT chest wo contrast High Resolution CT chest without contrast on ILD protocol. Only Dr Lorin Picket or Dr. Vinnie Langton to read In 3 months. Please orders  2. No ILD as I had thought  3. Coronary artery calcification - refer cardiology  4. fpr dyspnea: ensure he has CPST and come back to see me  --   lmtcb x1

## 2014-02-19 NOTE — Telephone Encounter (Signed)
lmomtcb x1 for pt 

## 2014-02-19 NOTE — Telephone Encounter (Signed)
See phone note 02/16/14

## 2014-02-20 NOTE — Telephone Encounter (Signed)
Spoke with pt and advised him of MR rec's and order has been placed for high resolution CT to be done. Pt has previously seen a cardiologist is [place] and is going to try to find his name and set up appointment with him.  If not he will let us know at his appointment 02/23/14 and we will place order for referral to cardiologist.  Pt has verbalized understanding and nothing further is needed at this time

## 2014-02-20 NOTE — Telephone Encounter (Signed)
Pt is returning call & can be reached at [phone].  Miguel Morales

## 2014-02-23 ENCOUNTER — Encounter: Payer: Self-pay | Admitting: Internal Medicine

## 2014-02-23 ENCOUNTER — Ambulatory Visit (INDEPENDENT_AMBULATORY_CARE_PROVIDER_SITE_OTHER): Payer: Medicare Other | Admitting: Internal Medicine

## 2014-02-23 VITALS — BP 126/76 | HR 109 | Ht 66.0 in | Wt 204.0 lb

## 2014-02-23 DIAGNOSIS — R0602 Shortness of breath: Secondary | ICD-10-CM | POA: Diagnosis not present

## 2014-02-23 DIAGNOSIS — R911 Solitary pulmonary nodule: Secondary | ICD-10-CM

## 2014-02-23 NOTE — Patient Instructions (Addendum)
#  Shortness of breath - No evidence of ILD per radiologist but if is there, will show up on future CT chest  -Pulmonary stress test suggests that this is due to you slightly being overweight and due to physical deconditioning  - As discussed you will start an exercise program with the help of your friends at the [facility]; if this does not work then I will refer you to pulmonary rehab at [facility]  #Lung nodule 89mm Left Upper Lobe Ground Glass and 39mm Right Right Lower lobe - seen April 2015 and NEW problem  -= do CT chest early July 2015; 3rd month CT chest  #Followup - early July 2015 after ct chest

## 2014-02-23 NOTE — Progress Notes (Signed)
Subjective:    Patient ID: Miguel Morales, male    DOB: 03/24/1946, 68 y.o.   MRN: [id number]  HPI   PCP Sallee Lange, MD  HPI  IOV 01/11/2014 Chief Complaint  Patient presents with  . Pulmonary Consult    pt states he was diagnosed with COPD back in 2011 by Dr. Wolfgang Phoenix. He states his breathing has not been any better since then so wants to f/u with a speacial    68 year old male with a 30 pack smoking history. 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 He is here for dyspnea evaluation. He's also plan for disability through the [place].  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 At that time he was in his 93s and he was still smoking. Subsequently after return to the [place] sometime in the mid 90s he developed insidious onset of shortness of breath with exertion and relieved by rest. Since then dyspnea is been slowly progressive despite quitting smoking around 2004. In 2011 he had a pulmonary function test at [facility] for which I do not have the full results but apparently was told that he had COPD. In the last 5-10 years these have progressively more dyspnea and exertion. Currently is on Spiriva for maintenance along with Combivent twice a day for maintenance [note he takes 2 anticholinergics for maintenance]. He notices dyspnea for climbing 1 flight of stairs or doing yard work or using a chainsaw or lifting boxes 10# boxes  food at [employer]. The last 5-10 years is also gained 10 pounds of weight. He says that several years ago he could do these tasks without a problem he did is now sustained cough or chest pain or edema.   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 COPD + May 13: Normal nuclear medicine Myoview of the heart June 2013: Stress Myoview EKG is normal per report   reports that he quit smoking about 10 years ago. His smoking use included Cigarettes. He has a 30 pack-year smoking history. He has never used smokeless tobacco. >>set up pft    01/18/2014 Follow up w/ PFT  Patient returns for a followup visit to review to pulmonary  function test results. PFTs (s/p BD) showed an FEV1 2.45 L, 88%, ratio of 72;  FVC 90%, no significant bronchodilator response. Mid-flows diminished at 67%, with 28% , change, status post bronchodilator.  Diffusing capacity, diminished at 68% No signifcant change in symptoms. Has  SOB with mild activity, and dry cough x years. Wears out easily, gives out of stamina.  Denies any chest pain, hemoptysis, orthopnea, PND, or leg swelling Does have quite a bit of postnasal drip, and drainage  REC May use  Claritin 10mg  At bedtime  As needed  Drainage  May use Veramyst nasal for nasal congesiton  I will call regarding CT chest  follow up Dr. Chase Caller in 6 weeks and  As needed     Late add : set up HR CT to look for ILD   OV 02/08/2014  Chief Complaint  Patient presents with  . Follow-up    Review CT chest done on tuesday.  Pt c/o SOB with exertion.  Denies chest congestion, tightness, wheezing.     Followup dyspnea workup in previous smoker and ex [place] War veteran.  PFT  test shows isolated reduction in diffusion capacity. High resolution CT scan of the chest done recently has no official report because the thoracic radiologist is on vacation. However to me it appears that there might be some supple bibasal right greater than left early reticular pattern  of interstitial lung disease without any specific honeycombing. 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 Follow-up    review cpst and ct chest.  c/o SOB with exertion.     Follow up dyspnea. Here to review results of CT scan of the chest and cardiopulmonary stress test. No new complaints. He presents with his wife Miguel Morales for the first time and she is interested in knowing the results of workup  CT chest 02/06/14 IMPRESSION:  1. No evidence of interstitial lung disease.  2. 10 x 13 mm left upper lobe ground-glass nodule. Initial follow-up  by chest CT without contrast is recommended in 3 months to confirm  persistence. This recommendation follows the consensus statement:  Recommendations for the Management of Subsolid Pulmonary Nodules  Detected at CT: A Statement from the [place] as published  in Radiology 2013; 266:304-317.  3. 4 mm right lower lobe nodule. Given risk factors for bronchogenic  carcinoma, follow-up chest CT at 1 year is recommended. This  recommendation follows the consensus statement: Guidelines for  Management of Small Pulmonary Nodules Detected on CT Scans: A  Statement from the [employer]isch[employer] Society as published in Radiology  2005; 237:395-400.  4. Three-vessel coronary artery calcification.  Electronically Signed  By: Lorin Picket M.D.  On: 02/09/2014 11:29    CPST 02/14/14 Interpretation  Notes: Patient gave an excellent effort. The pulse-oximetry remained at 98% throughout the exercise. Exercise was performed on a cycle ergometer starting at [facility] and increasing by 12 W/min.  ECG: Resting ECG in normal rhythm. There was an adequate HR response to the exercise with no ST-T changes or  sustained arrhythmias (rare, isolated PVCs were noted, with one observed at peak exercise). There was an increase in ventricular ectopy during the 3rd minute of recovery. The BP response was appropriate.  PFT: Resting spirometry within normal limits, but a restrictive pattern was observed in a reduced ERV. The MVV was normal. Post-exercise spirometry was performed IPE and 5, 10, 15 and 20 (reported above) minutes of recovery with no changes to suggestive of EIB.  CPX: The RER of 1.16 indicates a maximal effort. The peak VO2 is normal at 20.6 ml/kg/min (98% of the age/gender/weight matched sedentary norm). When adjusted to the patients ideal body weight of 158 lb (71.7 kg) the peak VO2 is 26.2 ml/kg (ibw)/min (105% of the IBW adjusted predicted). The VO2 a the ventilatory threshold is normal at 62% of the predicted peak VO2. At peak exercise the ventilation was 72% of the measured MVV indicating ventilatory limits were  being approached (respiratory rate was within expected limits, Vt/IC [65%] was within expected limits, PETCO2 was below normal). As mentioned above there was an adequate HR response to the exercise with a HR exceeding age-predicted maximum by 2 bpm. The O2pulse (a surrogate for stroke volume) increased throughout the exercise reaching 13 ml/beat (100% of predicted). The VE/VCO2 slope is moderately elevated and suggests increased dead space ventilation and reduced ventilatory efficiency. The oxygen uptake efficiency slope (OUES) is normal and reflects the patient's measured functional capacity.  Conclusion: Exercise testing with gas exchange demonstrates a normal functional capacity when compared to matched sedentary norms. These data suggest deconditioning as the primary cause of the patient's perceived exercise intolerance. There is a borderline ventilatory limitation to the exercise, with some hyperventilation at peak exercise. ^^^Preliminary CPX Results, Finalized  results will be forwarded when completed by interpreting physician.^^^  Report prepared by: Linzie Collin, PhD, ACSM-RCEP Sr. Exercise Physiologist 02/20/2014 4:33 PM  Review of Systems  Constitutional: Negative for fever and unexpected weight change.  HENT: Negative for congestion, dental problem, ear pain, nosebleeds, postnasal drip, rhinorrhea, sinus pressure, sneezing, sore throat and trouble swallowing.   Eyes: Negative for redness and itching.  Respiratory: Positive for shortness of breath. Negative for cough, chest tightness and wheezing.   Cardiovascular: Positive for leg swelling. Negative for palpitations.  Gastrointestinal: Negative for nausea and vomiting.  Genitourinary: Negative for dysuria.  Musculoskeletal: Negative for joint swelling.  Skin: Negative for rash.  Neurological: Negative for headaches.  Hematological: Does not bruise/bleed easily.  Psychiatric/Behavioral: Negative for dysphoric mood. The patient is not nervous/anxious.        Objective:   Physical Exam  Filed Vitals:   02/23/14 1512  BP: 126/76  Pulse: 109  Height: 5\' 6"  (1.676 m)  Weight: 204 lb (92.534 kg)  SpO2: 94%   Discussion visit only      Assessment & Plan:

## 2014-02-25 DIAGNOSIS — R911 Solitary pulmonary nodule: Secondary | ICD-10-CM | POA: Insufficient documentation

## 2014-02-25 NOTE — Assessment & Plan Note (Addendum)
#  Shortness of breath - No evidence of ILD per radiologist but if is there, will show up on future CT chest  -Pulmonary stress test suggests that this is due to you slightly being overweight and due to physical deconditioning  - As discussed you will start an exercise program with the help of your friends at the [facility]; if this does not work then I will refer you to pulmonary rehab at [facility]   > 50% of this > 25 min visit spent in face to face counseling (15 min visit converted to 25 min)

## 2014-02-25 NOTE — Assessment & Plan Note (Addendum)
#  Lung nodule 7mm Left Upper Lobe Ground Glass and 19mm Right Right Lower lobe - seen April 2015 and NEW problem  -= do CT chest early July 2015; 3rd month CT chest  #Followup - early July 2015 after ct chest    > 50% of this > 25 min visit spent in face to face counseling (15 min visit converted to 25 min)

## 2014-03-02 DIAGNOSIS — R0602 Shortness of breath: Secondary | ICD-10-CM

## 2014-03-05 ENCOUNTER — Ambulatory Visit: Payer: Medicare Other | Admitting: Internal Medicine

## 2014-03-15 ENCOUNTER — Ambulatory Visit (INDEPENDENT_AMBULATORY_CARE_PROVIDER_SITE_OTHER): Payer: Medicare Other | Admitting: Family Medicine

## 2014-03-15 ENCOUNTER — Encounter: Payer: Self-pay | Admitting: Family Medicine

## 2014-03-15 VITALS — BP 122/80 | Temp 98.6°F | Ht 66.0 in | Wt 200.0 lb

## 2014-03-15 DIAGNOSIS — R070 Pain in throat: Secondary | ICD-10-CM

## 2014-03-15 NOTE — Progress Notes (Signed)
   Subjective:    Patient ID: Miguel Morales, male    DOB: 04-09-1946, 68 y.o.   MRN: [id number]  HPI Patient is here today d/t difficulty with swallowing.  His throat is not sore.  He said he can feel something on the left side of his throat when he swallows.  He does have sinus drainage.  This has been going on for a week now.  Patient quit smoking several years back  Review of Systems  HENT: Positive for sore throat. Negative for congestion, ear pain, mouth sores, rhinorrhea and sinus pressure.   Respiratory: Positive for cough.   Cardiovascular: Negative for chest pain.       Objective:   Physical Exam  Constitutional: He appears well-developed.  HENT:  Head: Normocephalic.  Nose: Nose normal.  Mouth/Throat: Oropharynx is clear and moist. No oropharyngeal exudate.  Neck: Normal range of motion. Neck supple. No tracheal deviation present. No thyromegaly present.  Cardiovascular: Normal rate and regular rhythm.   Lymphadenopathy:    He has no cervical adenopathy.          Assessment & Plan:  Throat pain- will need ENT eval to look at throat to rule out reflux vs cancer. No sign of infection  ENT office spoken with. They will see him Monday morning patient is aware.

## 2014-03-19 ENCOUNTER — Other Ambulatory Visit (HOSPITAL_COMMUNITY)
Admission: RE | Admit: 2014-03-19 | Discharge: 2014-03-19 | Disposition: A | Discharge status: Home / Self Care | Payer: Medicare Other | Source: Ambulatory Visit | Attending: Otolaryngology | Admitting: Otolaryngology

## 2014-03-19 ENCOUNTER — Other Ambulatory Visit: Payer: Self-pay | Admitting: Otolaryngology

## 2014-03-19 DIAGNOSIS — J351 Hypertrophy of tonsils: Secondary | ICD-10-CM | POA: Diagnosis not present

## 2014-03-19 DIAGNOSIS — R911 Solitary pulmonary nodule: Secondary | ICD-10-CM | POA: Insufficient documentation

## 2014-03-19 DIAGNOSIS — K122 Cellulitis and abscess of mouth: Secondary | ICD-10-CM | POA: Diagnosis not present

## 2014-03-20 ENCOUNTER — Other Ambulatory Visit: Payer: Self-pay | Admitting: Otolaryngology

## 2014-03-20 ENCOUNTER — Telehealth: Payer: Self-pay | Admitting: *Deleted

## 2014-03-20 DIAGNOSIS — J351 Hypertrophy of tonsils: Secondary | ICD-10-CM

## 2014-03-20 NOTE — Telephone Encounter (Signed)
FYI: app w dr. Erik Obey went well wainting on results. due to have thorat ct scan on fri 15. some improvemeny waiting on test results

## 2014-03-23 ENCOUNTER — Ambulatory Visit
Admission: RE | Admit: 2014-03-23 | Discharge: 2014-03-23 | Disposition: A | Discharge status: Home / Self Care | Payer: Medicare Other | Source: Ambulatory Visit | Attending: Otolaryngology | Admitting: Otolaryngology

## 2014-03-23 DIAGNOSIS — J358 Other chronic diseases of tonsils and adenoids: Secondary | ICD-10-CM | POA: Diagnosis not present

## 2014-03-23 DIAGNOSIS — J351 Hypertrophy of tonsils: Secondary | ICD-10-CM

## 2014-03-23 MED ORDER — IOHEXOL 300 MG/ML  SOLN
75.0000 mL | Freq: Once | INTRAMUSCULAR | Status: AC | PRN
  Administered 2014-03-23: 75 mL via INTRAVENOUS

## 2014-04-12 ENCOUNTER — Ambulatory Visit (INDEPENDENT_AMBULATORY_CARE_PROVIDER_SITE_OTHER): Payer: Medicare Other | Admitting: Family Medicine

## 2014-04-12 ENCOUNTER — Encounter: Payer: Self-pay | Admitting: Family Medicine

## 2014-04-12 VITALS — BP 128/90 | Temp 98.1°F | Ht 66.0 in | Wt 202.0 lb

## 2014-04-12 DIAGNOSIS — I10 Essential (primary) hypertension: Secondary | ICD-10-CM

## 2014-04-12 DIAGNOSIS — R739 Hyperglycemia, unspecified: Secondary | ICD-10-CM | POA: Insufficient documentation

## 2014-04-12 DIAGNOSIS — E782 Mixed hyperlipidemia: Secondary | ICD-10-CM | POA: Diagnosis not present

## 2014-04-12 DIAGNOSIS — R7309 Other abnormal glucose: Secondary | ICD-10-CM | POA: Diagnosis not present

## 2014-04-12 DIAGNOSIS — J019 Acute sinusitis, unspecified: Secondary | ICD-10-CM

## 2014-04-12 DIAGNOSIS — Z79899 Other long term (current) drug therapy: Secondary | ICD-10-CM

## 2014-04-12 MED ORDER — CEFPROZIL 500 MG PO TABS: 500.0000 mg | ORAL_TABLET | Freq: Two times a day (BID) | ORAL | Status: DC

## 2014-04-12 NOTE — Progress Notes (Signed)
   Subjective:    Patient ID: Miguel Morales, male    DOB: 1946/01/06, 68 y.o.   MRN: [id number]  Sinusitis This is a new problem. Episode onset: 2 days ago. Maximum temperature: unmeasured. The fever has been present for less than 1 day. Associated symptoms include congestion, coughing, headaches, sinus pressure and a sore throat. Pertinent negatives include no ear pain. Treatments tried: ibu.      Review of Systems  Constitutional: Negative for fever and activity change.  HENT: Positive for congestion, rhinorrhea, sinus pressure and sore throat. Negative for ear pain.   Eyes: Negative for discharge.  Respiratory: Positive for cough. Negative for wheezing.   Cardiovascular: Negative for chest pain.  Neurological: Positive for headaches.        Objective:   Physical Exam  Nursing note and vitals reviewed. Constitutional: He appears well-developed.  HENT:  Head: Normocephalic.  Mouth/Throat: Oropharynx is clear and moist. No oropharyngeal exudate.  Neck: Normal range of motion.  Cardiovascular: Normal rate, regular rhythm and normal heart sounds.   No murmur heard. Pulmonary/Chest: Effort normal and breath sounds normal. He has no wheezes.  Lymphadenopathy:    He has no cervical adenopathy.  Neurological: He exhibits normal muscle tone.  Skin: Skin is warm and dry.          Assessment & Plan:  Sinusitis antibiotics prescribed warning signs discussed followup if problems.  Should be noted that pulmonologist is following him for a couple pulmonary nodules ENT will be doing surgery on him in the future for a area near the tonsil that the patient has been told is more than likely benign CT scan of the chest did show calcified coronary arteries the patient had a negative stress test a little over a year ago he is currently on cholesterol medicine we'll check lipid profile

## 2014-04-12 NOTE — Telephone Encounter (Signed)
I did discuss with the patient what is going on currently and they will probably be doing tonsillectomy later this summer. It should also be noted that the pulmonologist is following his pulmonary nodules.

## 2014-04-13 DIAGNOSIS — I10 Essential (primary) hypertension: Secondary | ICD-10-CM | POA: Diagnosis not present

## 2014-04-13 DIAGNOSIS — E782 Mixed hyperlipidemia: Secondary | ICD-10-CM | POA: Diagnosis not present

## 2014-04-13 DIAGNOSIS — R7309 Other abnormal glucose: Secondary | ICD-10-CM | POA: Diagnosis not present

## 2014-04-13 DIAGNOSIS — Z79899 Other long term (current) drug therapy: Secondary | ICD-10-CM | POA: Diagnosis not present

## 2014-04-13 LAB — BASIC METABOLIC PANEL
BUN: 12 mg/dL (ref 6–23)
CHLORIDE: 106 meq/L (ref 96–112)
CO2: 26 meq/L (ref 19–32)
CREATININE: 0.95 mg/dL (ref 0.50–1.35)
Calcium: 9 mg/dL (ref 8.4–10.5)
Glucose, Bld: 97 mg/dL (ref 70–99)
Potassium: 4.3 mEq/L (ref 3.5–5.3)
Sodium: 142 mEq/L (ref 135–145)

## 2014-04-13 LAB — LIPID PANEL
Cholesterol: 153 mg/dL (ref 0–200)
HDL: 36 mg/dL — ABNORMAL LOW (ref >39)
LDL CALC: 102 mg/dL — AB (ref 0–99)
TRIGLYCERIDES: 73 mg/dL (ref <150)
Total CHOL/HDL Ratio: 4.3 Ratio
VLDL: 15 mg/dL (ref 0–40)

## 2014-04-13 LAB — HEPATIC FUNCTION PANEL
ALK PHOS: 76 U/L (ref 39–117)
ALT: 21 U/L (ref 0–53)
AST: 23 U/L (ref 0–37)
Albumin: 3.9 g/dL (ref 3.5–5.2)
BILIRUBIN TOTAL: 0.8 mg/dL (ref 0.2–1.2)
Bilirubin, Direct: 0.2 mg/dL (ref 0.0–0.3)
Indirect Bilirubin: 0.6 mg/dL (ref 0.2–1.2)
Total Protein: 6.4 g/dL (ref 6.0–8.3)

## 2014-04-13 LAB — HEMOGLOBIN A1C
Hgb A1c MFr Bld: 6.1 % — ABNORMAL HIGH (ref <5.7)
Mean Plasma Glucose: 128 mg/dL — ABNORMAL HIGH (ref <117)

## 2014-04-26 DIAGNOSIS — J351 Hypertrophy of tonsils: Secondary | ICD-10-CM | POA: Diagnosis not present

## 2014-05-09 ENCOUNTER — Ambulatory Visit (INDEPENDENT_AMBULATORY_CARE_PROVIDER_SITE_OTHER)
Admission: RE | Admit: 2014-05-09 | Discharge: 2014-05-09 | Disposition: A | Discharge status: Home / Self Care | Payer: Medicare Other | Source: Ambulatory Visit | Attending: Internal Medicine | Admitting: Internal Medicine

## 2014-05-09 DIAGNOSIS — R911 Solitary pulmonary nodule: Secondary | ICD-10-CM | POA: Diagnosis not present

## 2014-05-09 DIAGNOSIS — J449 Chronic obstructive pulmonary disease, unspecified: Secondary | ICD-10-CM

## 2014-05-15 ENCOUNTER — Telehealth: Payer: Self-pay | Admitting: Internal Medicine

## 2014-05-15 NOTE — Telephone Encounter (Signed)
Let him know > 1cm LUL nodule is without change in 3 months. I was supposed to have him come and see me to discuss thse results. But I do not see any followup.   Therefore, please have him  A) do PET scan  B) indi blood test for lung nodule; ( I coordinate this through [place])  Both are to sort out if nodule is cancer or not  If he agrees - let me know by message back and I will set the indi blood test up   He can follow with me in august 2015  Thanks  Dr. Brand Males, M.D., F.C.C.P Pulmonary and Critical Care Medicine Staff Physician [facility] Pager: [phone], If no answer or between  15:00h - 7:00h: call [phone]  05/15/2014 9:05 PM

## 2014-05-16 NOTE — Telephone Encounter (Signed)
Called and spoke to pt. Informed pt of recs per MR, pt wants to continue with indi blood test and PET scan. PET scan order placed, pt aware that someone will be contacting him to set up PET scan. Appt made for f/u with MR on 07/03/2014.

## 2014-05-16 NOTE — Telephone Encounter (Signed)
lmomtcb x1 

## 2014-05-16 NOTE — Telephone Encounter (Signed)
Pt is returning [name]'s call.  Miguel Morales

## 2014-05-16 NOTE — Telephone Encounter (Signed)
lmtcb

## 2014-05-17 NOTE — Telephone Encounter (Signed)
Pt returned call. Pt wanted to know which lung the nodule was in. Informed pt that the nodule was in the LUL, without change. Pt verbalized understanding and denied any further questions or concerns at this time.

## 2014-05-17 NOTE — Telephone Encounter (Signed)
Did he agree to indi blood test? Not sure from your phone messages

## 2014-05-18 NOTE — Telephone Encounter (Signed)
Yes. He did agree with the blood test.

## 2014-05-21 NOTE — Telephone Encounter (Signed)
Ok I have told [facility] lung cancer navigator to set it up. REsults ultimately come to me. Check your email ; I copied you on string to [facility]

## 2014-05-22 ENCOUNTER — Encounter: Payer: Self-pay | Admitting: *Deleted

## 2014-05-22 NOTE — Progress Notes (Signed)
Completed Indi blood test form and fax per Dr. Golden Pop request.

## 2014-05-31 ENCOUNTER — Telehealth: Payer: Self-pay | Admitting: Internal Medicine

## 2014-05-31 DIAGNOSIS — R0602 Shortness of breath: Secondary | ICD-10-CM | POA: Diagnosis not present

## 2014-05-31 DIAGNOSIS — R911 Solitary pulmonary nodule: Secondary | ICD-10-CM

## 2014-05-31 NOTE — Telephone Encounter (Signed)
Per phone note 05/15/14: Brand Males, MD at 05/15/2014 9:02 PM     Status: Signed        Let him know > 1cm LUL nodule is without change in 3 months. I was supposed to have him come and see me to discuss thse results. But I do not see any followup.  Therefore, please have him  A) do PET scan  B) indi blood test for lung nodule; ( I coordinate this through [place])  Both are to sort out if nodule is cancer or not  If he agrees - let me know by message back and I will set the indi blood test up  He can follow with me in august 2015  Thanks  Dr. Brand Males, M.D., F.C.C.P  Pulmonary and Critical Care Medicine  Staff Physician  [facility]  Pager: [phone], If no answer or between 15:00h - 7:00h: call [phone]     ---  Looks like MR wants pt to have PET scan. Does not look like this was ever ordered. I have done so. Pt aware PCC;s will call him. Please advise thanks

## 2014-05-31 NOTE — Telephone Encounter (Signed)
Pet scan scheduled 06/08/14@11am  and pt is aware Miguel Morales

## 2014-06-04 ENCOUNTER — Telehealth: Payer: Self-pay | Admitting: Family Medicine

## 2014-06-04 NOTE — Telephone Encounter (Signed)
Patient needs order for labs.

## 2014-06-04 NOTE — Telephone Encounter (Signed)
Pt just had done on 04/12/14: A1C, met7, lip, liv.  Need any other labs?

## 2014-06-04 NOTE — Telephone Encounter (Signed)
Scott said one mo ago A1C in house plus office visit in three months so therefore two mo fr now no other tests

## 2014-06-05 NOTE — Telephone Encounter (Addendum)
Discussed with patient. Patient has follow up office visit scheduled 07/02/14.

## 2014-06-08 ENCOUNTER — Encounter (HOSPITAL_COMMUNITY): Payer: Self-pay

## 2014-06-08 ENCOUNTER — Ambulatory Visit (HOSPITAL_COMMUNITY)
Admission: RE | Admit: 2014-06-08 | Discharge: 2014-06-08 | Disposition: A | Discharge status: Home / Self Care | Payer: Medicare Other | Source: Ambulatory Visit | Attending: Internal Medicine | Admitting: Internal Medicine

## 2014-06-08 DIAGNOSIS — I700 Atherosclerosis of aorta: Secondary | ICD-10-CM | POA: Insufficient documentation

## 2014-06-08 DIAGNOSIS — R911 Solitary pulmonary nodule: Secondary | ICD-10-CM | POA: Insufficient documentation

## 2014-06-08 DIAGNOSIS — K5730 Diverticulosis of large intestine without perforation or abscess without bleeding: Secondary | ICD-10-CM | POA: Diagnosis not present

## 2014-06-08 DIAGNOSIS — K7689 Other specified diseases of liver: Secondary | ICD-10-CM | POA: Diagnosis not present

## 2014-06-08 DIAGNOSIS — J984 Other disorders of lung: Secondary | ICD-10-CM | POA: Diagnosis not present

## 2014-06-08 LAB — GLUCOSE, CAPILLARY: Glucose-Capillary: 108 mg/dL — ABNORMAL HIGH (ref 70–99)

## 2014-06-08 MED ORDER — FLUDEOXYGLUCOSE F - 18 (FDG) INJECTION
10.9000 | Freq: Once | INTRAVENOUS | Status: AC | PRN
  Administered 2014-06-08: 10.9 via INTRAVENOUS

## 2014-06-11 ENCOUNTER — Telehealth: Payer: Self-pay | Admitting: Internal Medicine

## 2014-06-11 NOTE — Telephone Encounter (Signed)
Called spoke with pt. He had PET and glucose done on 06/08/14. Pt aware MR not back in until 06/15/14. Please advise thanks

## 2014-06-14 NOTE — Telephone Encounter (Signed)
Pt returned call.  Discussed MR's recs with him as stated below regarding the PET scan results and waiting for the INDI results.  Pt okay with this and verbalized his understanding.  Nothing further needed at this time; will sign off.

## 2014-06-14 NOTE — Telephone Encounter (Signed)
lmtcb x1 

## 2014-06-14 NOTE — Telephone Encounter (Signed)
Let him know that PET scan is not conclusive if the left upper lobe nodule is cancer or not; wil wait for INDI blood test result and guive him a call. They said another 1 week atleast before INDIresults come back  Dr. Brand Males, M.D., [facility].C.P Pulmonary and Critical Care Medicine Staff Physician [place] [facility] Pager: [phone], If no answer or between  15:00h - 7:00h: call [phone]  06/14/2014 8:10 AM        IMPRESSION:  PEt 06/08/14 1. Persistent left upper lobe ground-glass density with only  low-level hypermetabolism, as expected. 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 Adenocarcinoma cannot be  excluded. Follow up by CT is recommended in 12 months, with  continued annual surveillance for a minimum of 3 years. These  recommendations are taken from "Recommendations for the Management  of Subsolid Pulmonary Nodules Detected at CT: A Statement from the  [place]" Radiology 2013; 266:1, 304-317.  2. No other suspicious activity within the neck, chest, abdomen or  pelvis.  3. Atherosclerosis, hepatic steatosis and sigmoid colon  diverticulosis.  Electronically Signed  By: Camie Patience M.D.  On: 06/08/2014 14:21

## 2014-06-18 ENCOUNTER — Encounter: Payer: Self-pay | Admitting: Internal Medicine

## 2014-06-20 ENCOUNTER — Telehealth: Payer: Self-pay | Admitting: Internal Medicine

## 2014-06-20 NOTE — Telephone Encounter (Signed)
Called and spoke with pt and he stated that he had a nurse come to his home and drew labs on 7/23---he stated that this was prior to his PET scan which he was told the results of that were inconclusive.  Pt is requesting the results of those labs that were done.  MR please advise. Thanks  Allergies  Allergen Reactions  . Atorvastatin     Had severe arthralgia with all, can not tolerate statins  . Sulfa Antibiotics     Current Outpatient Prescriptions on File Prior to Visit  Medication Sig Dispense Refill  . albuterol-ipratropium (COMBIVENT) 18-103 MCG/ACT inhaler Inhale 2 puffs into the lungs every 6 (six) hours as needed for wheezing.      Marland Kitchen amLODipine (NORVASC) 10 MG tablet TAKE 1 TABLET DAILY  90 tablet  1  . Ascorbic Acid (VITAMIN C) 1000 MG tablet Take 1,000 mg by mouth daily.      Marland Kitchen aspirin 81 MG tablet Take 81 mg by mouth daily.      Marland Kitchen atorvastatin (LIPITOR) 20 MG tablet Take 20 mg by mouth every other day.      . cefPROZIL (CEFZIL) 500 MG tablet Take 1 tablet (500 mg total) by mouth 2 (two) times daily.  20 tablet  0  . Coenzyme Q10 (COQ10) 400 MG CAPS Take by mouth daily.      . Flaxseed, Linseed, (FLAXSEED OIL) 1000 MG CAPS Take 1 capsule by mouth daily.      . fluticasone (FLONASE) 50 MCG/ACT nasal spray Place 1 spray into both nostrils daily as needed.  48 g  1  . Loratadine 10 MG CAPS Take by mouth daily. PRN      . omeprazole (PRILOSEC) 20 MG capsule TAKE 1 CAPSULE DAILY  90 capsule  1  . tiotropium (SPIRIVA HANDIHALER) 18 MCG inhalation capsule Place 1 capsule (18 mcg total) into inhaler and inhale 1 day or 1 dose.  90 capsule  3   No current facility-administered medications on file prior to visit.

## 2014-06-21 NOTE — Telephone Encounter (Signed)
INdi Dx informed me yesterday that results should have arrived by 06/21/2014. I am sending this to Manuela Schwartz to look at my "look at" and see if is there and get back to me

## 2014-06-21 NOTE — Telephone Encounter (Signed)
Yes the results are in your look-at's.

## 2014-06-21 NOTE — Telephone Encounter (Signed)
Please fax to [facility] at [phone], Call [phone] after to confirm. Please put for my attention  Thanks Dr. Brand Males, M.D., [facility].C.P Pulmonary and Critical Care Medicine Staff Physician [place] [facility] Pager: [phone], If no answer or between  15:00h - 7:00h: call [phone]  06/21/2014 11:27 AM

## 2014-06-22 NOTE — Telephone Encounter (Signed)
indiDx 05/31/14 puts probability of lung nodule being benign at 90%. Tell patient blood test suggests lung nodule is likely benign based on this blood test. When he sees me 07/03/14 I can discuss management options taking PET scan and this blood result into account. This is best discussed face to face and not over phone

## 2014-06-22 NOTE — Telephone Encounter (Signed)
Per Daneil Dan, these have not been faxed yet I have called to confirm that MR still at [phone]--spoke with Wyvonne Lenz, states that MR has gone to do a talk-will return around 10a--made aware that these are being faxed to MR to address when he returns at [place] faxed to [place] to give to MR to review.  Pt calling for results.  Please advise MR. Thanks.

## 2014-06-22 NOTE — Telephone Encounter (Signed)
Pt is asking for lab results, please & asks to be reached at [phone].  Miguel Morales

## 2014-06-22 NOTE — Telephone Encounter (Signed)
Results have been explained to patient, pt expressed understanding. Nothing further needed.  

## 2014-07-02 ENCOUNTER — Encounter: Payer: Self-pay | Admitting: Family Medicine

## 2014-07-02 ENCOUNTER — Ambulatory Visit (INDEPENDENT_AMBULATORY_CARE_PROVIDER_SITE_OTHER): Payer: Medicare Other | Admitting: Family Medicine

## 2014-07-02 VITALS — BP 114/70 | Ht 66.0 in | Wt 201.0 lb

## 2014-07-02 DIAGNOSIS — R7309 Other abnormal glucose: Secondary | ICD-10-CM

## 2014-07-02 DIAGNOSIS — I10 Essential (primary) hypertension: Secondary | ICD-10-CM

## 2014-07-02 DIAGNOSIS — R739 Hyperglycemia, unspecified: Secondary | ICD-10-CM

## 2014-07-02 DIAGNOSIS — E782 Mixed hyperlipidemia: Secondary | ICD-10-CM | POA: Diagnosis not present

## 2014-07-02 MED ORDER — TIOTROPIUM BROMIDE MONOHYDRATE 18 MCG IN CAPS: 18.0000 ug | ORAL_CAPSULE | RESPIRATORY_TRACT | Status: DC

## 2014-07-02 MED ORDER — OMEPRAZOLE 20 MG PO CPDR: 20.0000 mg | DELAYED_RELEASE_CAPSULE | Freq: Every day | ORAL | Status: DC

## 2014-07-02 MED ORDER — AMLODIPINE BESYLATE 10 MG PO TABS: 10.0000 mg | ORAL_TABLET | Freq: Every day | ORAL | Status: DC

## 2014-07-02 MED ORDER — ATORVASTATIN CALCIUM 20 MG PO TABS: ORAL_TABLET | ORAL | Status: DC

## 2014-07-02 MED ORDER — IPRATROPIUM-ALBUTEROL 18-103 MCG/ACT IN AERO: 2.0000 | INHALATION_SPRAY | Freq: Four times a day (QID) | RESPIRATORY_TRACT | Status: DC | PRN

## 2014-07-02 NOTE — Progress Notes (Signed)
   Subjective:    Patient ID: Miguel Morales, male    DOB: [birth year], 68 y.o.   MRN: [id number]  Hypertension This is a chronic problem. The current episode started more than 1 year ago. Associated symptoms include shortness of breath. (Followed by specialist)  Still having some ankle swelling but is much better since changing lipitor to every other day.  Patient does try to watch his diet. He does state at times he is stuffy nose and he should. He denies any major change in diet.  He denies any chest pressure tightness or pain. He does get a little short of breath with activity but this is because of COPD.  He denies excessive thirst or urination does try to minimize sugar intake No other concerns today.     Review of Systems  Respiratory: Positive for shortness of breath.        Objective:   Physical Exam Lungs are clear heart is regular pulse normal extremities no edema neurologic grossly normal.       Assessment & Plan:  HTN-stable. Continue current measures. Minimal edema in the ankles. No diuretic necessary. I feel this is a side effect of his medication  #2 hyperlipidemia continue current medication. Recheck lab work in September  #3 prediabetes recheck A1c in September. Watch diet closely stay physically active  #4 chronic lung issues including recent pulmonary nodule encouraged patient to followup with specialist more than likely they will do a followup CT scan in 6-12 months.

## 2014-07-03 ENCOUNTER — Encounter: Payer: Self-pay | Admitting: Internal Medicine

## 2014-07-03 ENCOUNTER — Ambulatory Visit (INDEPENDENT_AMBULATORY_CARE_PROVIDER_SITE_OTHER): Payer: Medicare Other | Admitting: Internal Medicine

## 2014-07-03 VITALS — BP 142/86 | HR 90 | Ht 66.0 in | Wt 203.0 lb

## 2014-07-03 DIAGNOSIS — R0602 Shortness of breath: Secondary | ICD-10-CM | POA: Diagnosis not present

## 2014-07-03 DIAGNOSIS — R0989 Other specified symptoms and signs involving the circulatory and respiratory systems: Secondary | ICD-10-CM | POA: Diagnosis not present

## 2014-07-03 DIAGNOSIS — R911 Solitary pulmonary nodule: Secondary | ICD-10-CM | POA: Diagnosis not present

## 2014-07-03 DIAGNOSIS — R0600 Dyspnea, unspecified: Secondary | ICD-10-CM

## 2014-07-03 DIAGNOSIS — R0609 Other forms of dyspnea: Secondary | ICD-10-CM

## 2014-07-03 NOTE — Progress Notes (Signed)
Subjective:    Patient ID: Miguel Morales, male    DOB: 17-May-1946, 68 y.o.   MRN: [id number]  HPI   PCP Sallee Lange, MD  HPI  IOV 01/11/2014 Chief Complaint  Patient presents with  . Pulmonary Consult    pt states he was diagnosed with COPD back in 2011 by Dr. Wolfgang Phoenix. He states his breathing has not been any better since then so wants to f/u with a speacial    68 year old male with a 30 pack smoking history. He is a veteran of the first [employer] in 1991. He is here for dyspnea evaluation. He's also plan for disability through the [place].  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 At that time he was in his 69s and he was still smoking. Subsequently after return to the [place] sometime in the mid 90s he developed insidious onset of shortness of breath with exertion and relieved by rest. Since then dyspnea is been slowly progressive despite quitting smoking around 2004. In 2011 he had a pulmonary function test at [facility] for which I do not have the full results but apparently was told that he had COPD. In the last 5-10 years these have progressively more dyspnea and exertion. Currently is on Spiriva for maintenance along with Combivent twice a day for maintenance [note he takes 2 anticholinergics for maintenance]. He notices dyspnea for climbing 1 flight of stairs or doing yard work or using a chainsaw or lifting boxes 10# boxes  food at [employer]. The last 5-10 years is also gained 10 pounds of weight. He says that several years ago he could do these tasks without a problem he did is now sustained cough or chest pain or edema.   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 COPD + May 13: Normal nuclear medicine Myoview of the heart June 2013: Stress Myoview EKG is normal per report   reports that he quit smoking about 10 years ago. His smoking use included Cigarettes. He has a 30 pack-year smoking history. He has never used smokeless tobacco. >>set up pft    01/18/2014 Follow up w/ PFT  Patient returns for a followup visit to review to pulmonary  function test results. PFTs (s/p BD) showed an FEV1 2.45 L, 88%, ratio of 72;  FVC 90%, no significant bronchodilator response. Mid-flows diminished at 67%, with 28% , change, status post bronchodilator.  Diffusing capacity, diminished at 68% No signifcant change in symptoms. Has  SOB with mild activity, and dry cough x years. Wears out easily, gives out of stamina.  Denies any chest pain, hemoptysis, orthopnea, PND, or leg swelling Does have quite a bit of postnasal drip, and drainage  REC May use  Claritin 10mg  At bedtime  As needed  Drainage  May use Veramyst nasal for nasal congesiton  I will call regarding CT chest  follow up Dr. Chase Caller in 6 weeks and  As needed     Late add : set up HR CT to look for ILD   OV 02/08/2014  Chief Complaint  Patient presents with  . Follow-up    Review CT chest done on tuesday.  Pt c/o SOB with exertion.  Denies chest congestion, tightness, wheezing.     Followup dyspnea workup in previous smoker and ex [place] War veteran.  PFT  test shows isolated reduction in diffusion capacity. High resolution CT scan of the chest done recently has no official report because the thoracic radiologist is on vacation. However to me it appears that there might be some supple bibasal right greater than left early reticular pattern  of interstitial lung disease without any specific honeycombing. 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 Follow-up    review cpst and ct chest.  c/o SOB with exertion.     Follow up dyspnea. Here to review results of CT scan of the chest and cardiopulmonary stress test. No new complaints. He presents with his wife Miguel Morales for the first time and she is interested in knowing the results of workup  CT chest 02/06/14 IMPRESSION:  1. No evidence of interstitial lung disease.  2. 10 x 13 mm left upper lobe ground-glass nodule. Initial follow-up  by chest CT without contrast is recommended in 3 months to confirm  persistence. This recommendation follows the consensus statement:  Recommendations for the Management of Subsolid Pulmonary Nodules  Detected at CT: A Statement from the [employer] as published  in Radiology 2013; 266:304-317.  3. 4 mm right lower lobe nodule. Given risk factors for bronchogenic  carcinoma, follow-up chest CT at 1 year is recommended. This  recommendation follows the consensus statement: Guidelines for  Management of Small Pulmonary Nodules Detected on CT Scans: A  Statement from the Fleischner Society as published in Radiology  2005; 237:395-400.  4. Three-vessel coronary artery calcification.  Electronically Signed  By: Lorin Picket M.D.  On: 02/09/2014 11:29    CPST 02/14/14 Interpretation  Notes: Patient gave an excellent effort. The pulse-oximetry remained at 98% throughout the exercise. Exercise was performed on a cycle ergometer starting at [facility] and increasing by 12 W/min.  ECG: Resting ECG in normal rhythm. There was an adequate HR response to the exercise with no ST-T changes or  sustained arrhythmias (rare, isolated PVCs were noted, with one observed at peak exercise). There was an increase in ventricular ectopy during the 3rd minute of recovery. The BP response was appropriate.  PFT: Resting spirometry within normal limits, but a restrictive pattern was observed in a reduced ERV. The MVV was normal. Post-exercise spirometry was performed IPE and 5, 10, 15 and 20 (reported above) minutes of recovery with no changes to suggestive of EIB.  CPX: The RER of 1.16 indicates a maximal effort. The peak VO2 is normal at 20.6 ml/kg/min (98% of the age/gender/weight matched sedentary norm). When adjusted to the patients ideal body weight of 158 lb (71.7 kg) the peak VO2 is 26.2 ml/kg (ibw)/min (105% of the IBW adjusted predicted). The VO2 a the ventilatory threshold is normal at 62% of the predicted peak VO2. At peak exercise the ventilation was 72% of the measured MVV indicating ventilatory limits were  being approached (respiratory rate was within expected limits, Vt/IC [65%] was within expected limits, PETCO2 was below normal). As mentioned above there was an adequate HR response to the exercise with a HR exceeding age-predicted maximum by 2 bpm. The O2pulse (a surrogate for stroke volume) increased throughout the exercise reaching 13 ml/beat (100% of predicted). The VE/VCO2 slope is moderately elevated and suggests increased dead space ventilation and reduced ventilatory efficiency. The oxygen uptake efficiency slope (OUES) is normal and reflects the patient's measured functional capacity.  Conclusion: Exercise testing with gas exchange demonstrates a normal functional capacity when compared to matched sedentary norms. These data suggest deconditioning as the primary cause of the patient's perceived exercise intolerance. There is a borderline ventilatory limitation to the exercise, with some hyperventilation at peak exercise.     Report prepared by: Linzie Collin, PhD, ACSM-RCEP Sr. Exercise Physiologist 02/20/2014 4:33 PM   #Shortness of breath - No evidence of ILD per radiologist but if is there, will show up on future CT chest  -Pulmonary stress test suggests that this is due to you slightly being overweight and due to physical deconditioning  - As discussed you will start an exercise program with the help of your friends at the [facility]; if this does not work then I will refer you to pulmonary rehab at [facility]  #Lung nodule 72mm Left Upper Lobe Ground Glass and 59mm Right Right Lower lobe - seen April 2015 and NEW problem  -= do CT chest early July 2015; 3rd month CT chest  #Followup - early July 2015 after ct chest    OV 07/03/2014  Chief Complaint  Patient presents with  . Follow-up    Pt here to review PET. Pt staes his breathing is unchanged. Pt c/o DOE, cough with intermittent mucous production, clear in color. Pt c/o mild and rare chest tightness with over exertion   FU   - dyspnea and LUL nodule   - dyspnea: CPST showed duye to obesity and deconditioning. I advised pulm rehab but he opted to work out at [employer]. Instead he is only sporadically working out at home. Therefore, dyspnea not better. I counseled and he is willing to attend rehab at [facility]  -  Lung nodule LUL 57mm and 62mm RLLL  -both new April 2015. We worked this up further with PET scan 05/11/10/5; indeterminate low prob. And with INDI Dx blood tests indiDx 05/31/14 puts probability of lung nodule being benign at 90%.       Past hx: Seeing Dr Erik Obey for fullness in left tonsil and otalgia found to have 84mm cystic lesion. S/p bx 03/19/14 and had atypical cells. THis is not seen in PET. Aparently he has been reassured this is benigng  Review of Systems  Constitutional: Negative for fever and unexpected weight change.  HENT: Positive for postnasal drip and trouble swallowing. Negative for congestion, dental problem, ear pain, nosebleeds, rhinorrhea, sinus pressure,  sneezing and sore throat.   Eyes: Negative for redness and itching.  Respiratory: Positive for cough, chest tightness and shortness of breath. Negative for wheezing.   Cardiovascular: Negative for palpitations and leg swelling.  Gastrointestinal: Negative for nausea and vomiting.  Genitourinary: Negative for dysuria.  Musculoskeletal: Negative for joint swelling.  Skin: Negative for rash.  Neurological: Negative for headaches.  Hematological: Does not bruise/bleed easily.  Psychiatric/Behavioral: Negative for dysphoric mood. The patient is not nervous/anxious.        Objective:   Physical Exam  Filed Vitals:  07/03/14 1009  BP: 142/86  Pulse: 90  Height: 5\' 6"  (1.676 m)  Weight: 203 lb (92.08 kg)  SpO2: 97%   ursing note and vitals reviewed. Constitutional: He is oriented to person, place, and time. He appears well-developed and well-nourished. No distress.  Body mass index is 32.78 kg/(m^2).    HENT:  Head: Normocephalic and atraumatic.  Right Ear: External ear normal.  Left Ear: External ear normal.  Mouth/Throat: Oropharynx is clear and moist. No oropharyngeal exudate.  Eyes: Conjunctivae and EOM are normal. Pupils are equal, round, and reactive to light. Right eye exhibits no discharge. Left eye exhibits no discharge. No scleral icterus.  Neck: Normal range of motion. Neck supple. No JVD present. No tracheal deviation present. No thyromegaly present.  Cardiovascular: Normal rate, regular rhythm and intact distal pulses.  Exam reveals no gallop and no friction rub.   No murmur heard. Pulmonary/Chest: Effort normal and breath sounds normal. No respiratory distress. He has no wheezes. He has no rales. He exhibits no tenderness.  Abdominal: Soft. Bowel sounds are normal. He exhibits no distension and no mass. There is no tenderness. There is no rebound and no guarding.  Visceral obesity +  Musculoskeletal: Normal range of motion. He exhibits no edema and no tenderness.    Lymphadenopathy:    He has no cervical adenopathy.  Neurological: He is alert and oriented to person, place, and time. He has normal reflexes. No cranial nerve deficit. Coordination normal.  Skin: Skin is warm and dry. No rash noted. He is not diaphoretic. No erythema. No pallor.  Psychiatric: He has a normal mood and affect. His behavior is normal. Judgment and thought content normal.          Assessment & Plan:  #Shortness of breath - No evidence of ILD per radiologist but if is there, will show up on future CT chest  -Pulmonary stress test suggests that this is due to you slightly being overweight and due to physical deconditioning  - Refer to [place] program for dyspnea   #Lung nodule 76mm Left Upper Lobe Ground Glass and 36mm Right Right Lower lobe - seen April 2015 and NEW problem  - low prob for lung cancer based on July 2015 PET scan band INDIDx blood test  - note: does not eliminate possibility of cancer  - You will still need close surveillance CT Chest scan but you do not need surgical intervention right now  - Please do next CT chest wo contrast  End Nov 2015  #Followup - end Nov 2015 after CT chest

## 2014-07-03 NOTE — Patient Instructions (Addendum)
#  Shortness of breath - No evidence of ILD per radiologist but if is there, will show up on future CT chest  -Pulmonary stress test suggests that this is due to you slightly being overweight and due to physical deconditioning  - Refer to [place] program for dyspnea   #Lung nodule 51mm Left Upper Lobe Ground Glass and 78mm Right Right Lower lobe - seen April 2015 and NEW problem  - low prob for lung cancer based on July 2015 PET scan band INDIDx blood test  - note: does not eliminate possibility of cancer  - You will still need close surveillance CT Chest scan  - Please do next CT chest wo contrast  End Nov 2015  #Followup - end Nov 2015 after CT chest

## 2014-07-04 DIAGNOSIS — R0600 Dyspnea, unspecified: Secondary | ICD-10-CM | POA: Insufficient documentation

## 2014-07-04 DIAGNOSIS — R911 Solitary pulmonary nodule: Secondary | ICD-10-CM | POA: Insufficient documentation

## 2014-07-04 NOTE — Assessment & Plan Note (Signed)
#  Shortness of breath - No evidence of ILD per radiologist but if is there, will show up on future CT chest  -Pulmonary stress test suggests that this is due to you slightly being overweight and due to physical deconditioning  - Refer to [place] program for dyspnea

## 2014-07-04 NOTE — Assessment & Plan Note (Signed)
  #  Lung nodule 44mm Left Upper Lobe Ground Glass and 73mm Right Right Lower lobe - seen April 2015 and NEW problem  - low prob for lung cancer based on July 2015 PET scan band INDIDx blood test  - note: does not eliminate possibility of cancer  - You will still need close surveillance CT Chest scan but you do not need surgical intervention right now  - Please do next CT chest wo contrast  End Nov 2015  #Followup - end Nov 2015 after CT chest

## 2014-07-08 ENCOUNTER — Other Ambulatory Visit: Payer: Self-pay | Admitting: Family Medicine

## 2014-07-10 ENCOUNTER — Other Ambulatory Visit: Payer: Self-pay | Admitting: Family Medicine

## 2014-07-12 ENCOUNTER — Telehealth: Payer: Self-pay

## 2014-07-12 ENCOUNTER — Ambulatory Visit: Payer: Medicare Other | Admitting: Gastroenterology

## 2014-07-12 ENCOUNTER — Other Ambulatory Visit: Payer: Self-pay

## 2014-07-12 DIAGNOSIS — Z86010 Personal history of colonic polyps: Secondary | ICD-10-CM

## 2014-07-17 NOTE — Telephone Encounter (Signed)
Gastroenterology Pre-Procedure Review  Request Date: 07/12/2014 Requesting Physician: PT WAS ON RECALL LAST DONE 05/22/2009 AND NEXT RECOMMENDED IN 5 YEARS ( HX OF ADENOMA) TRIAGED PT SINCE WE HAD TO CANCEL HIS OV APPT FOR AN URGENT PT  PATIENT REVIEW QUESTIONS: The patient responded to the following health history questions as indicated:    1. Diabetes Melitis: no 2. Joint replacements in the past 12 months: no 3. Major health problems in the past 3 months: no 4. Has an artificial valve or MVP: no 5. Has a defibrillator: no 6. Has been advised in past to take antibiotics in advance of a procedure like teeth cleaning: no    MEDICATIONS & ALLERGIES:    Patient reports the following regarding taking any blood thinners:   Plavix? no Aspirin? yes Coumadin? no  Patient confirms/reports the following medications:  Current Outpatient Prescriptions  Medication Sig Dispense Refill  . albuterol-ipratropium (COMBIVENT) 18-103 MCG/ACT inhaler Inhale 2 puffs into the lungs every 6 (six) hours as needed for wheezing.  3 Inhaler  3  . amLODipine (NORVASC) 10 MG tablet Take 1 tablet (10 mg total) by mouth daily.  90 tablet  3  . Ascorbic Acid (VITAMIN C) 1000 MG tablet Take 1,000 mg by mouth daily.      Marland Kitchen aspirin 81 MG tablet Take 81 mg by mouth daily.      Marland Kitchen atorvastatin (LIPITOR) 20 MG tablet Take one tablet every other day  45 tablet  3  . Coenzyme Q10 (COQ10) 400 MG CAPS Take by mouth daily.      . Flaxseed, Linseed, (FLAXSEED OIL) 1000 MG CAPS Take 1 capsule by mouth daily.      . fluticasone (FLONASE) 50 MCG/ACT nasal spray Place 1 spray into both nostrils daily as needed.  48 g  1  . hydrOXYzine (ATARAX/VISTARIL) 10 MG tablet Take 10 mg by mouth 3 (three) times daily as needed. 2 tablets once daily      . Loratadine 10 MG CAPS Take by mouth daily. PRN      . methocarbamol (ROBAXIN) 500 MG tablet Take 500 mg by mouth daily as needed for muscle spasms.      Marland Kitchen omeprazole (PRILOSEC) 20 MG capsule  Take 20 mg by mouth daily as needed.      . tiotropium (SPIRIVA HANDIHALER) 18 MCG inhalation capsule Place 1 capsule (18 mcg total) into inhaler and inhale 1 day or 1 dose.  90 capsule  3  . amLODipine (NORVASC) 10 MG tablet TAKE 1 TABLET DAILY  90 tablet  1  . omeprazole (PRILOSEC) 20 MG capsule TAKE 1 CAPSULE DAILY  90 capsule  1   No current facility-administered medications for this visit.    Patient confirms/reports the following allergies:  Allergies  Allergen Reactions  . Sulfa Antibiotics     No orders of the defined types were placed in this encounter.    AUTHORIZATION INFORMATION Primary Insurance: ID #:  Group #:  Pre-Cert / Auth required: Pre-Cert / Auth #:   Secondary Insurance:   ID #:   Group #:  Pre-Cert / Auth required: Pre-Cert / Auth #:   SCHEDULE INFORMATION: Procedure has been scheduled as follows:  Date:  08/02/2014                 Time:2:00 PM Location: [facility] Short Stay  This Gastroenterology Pre-Precedure Review Form is being routed to the following provider(s): R. Garfield Cornea, MD

## 2014-07-18 ENCOUNTER — Other Ambulatory Visit: Payer: Self-pay

## 2014-07-18 ENCOUNTER — Encounter (HOSPITAL_COMMUNITY): Payer: Self-pay | Admitting: Pharmacy Technician

## 2014-07-18 MED ORDER — PEG-KCL-NACL-NASULF-NA ASC-C 100 G PO SOLR: 1.0000 | ORAL | Status: DC

## 2014-07-18 NOTE — Telephone Encounter (Signed)
I called Miguel Morales and she is making a note for endo in reference to the hx of bradycardia. I gave her the complete message about the use of the prophylactic atropine at pt's last two colonoscopies.

## 2014-07-18 NOTE — Telephone Encounter (Signed)
Rx sent to the pharmacy and instructions mailed to pt.  

## 2014-07-18 NOTE — Telephone Encounter (Signed)
Noted.  Please document somewhere for endo   He has a history of significant bradycardia during his colonoscopy (in the past). He was given prophylactic atropine at his last two colonoscopies.

## 2014-08-02 ENCOUNTER — Ambulatory Visit (HOSPITAL_COMMUNITY)
Admission: RE | Admit: 2014-08-02 | Discharge: 2014-08-02 | Disposition: A | Discharge status: Home / Self Care | Payer: Medicare Other | Source: Ambulatory Visit | Attending: Internal Medicine | Admitting: Internal Medicine

## 2014-08-02 ENCOUNTER — Encounter (HOSPITAL_COMMUNITY)
Admission: RE | Disposition: A | Discharge status: Home / Self Care | Payer: Self-pay | Source: Ambulatory Visit | Attending: Internal Medicine

## 2014-08-02 ENCOUNTER — Encounter (HOSPITAL_COMMUNITY): Payer: Self-pay | Admitting: *Deleted

## 2014-08-02 DIAGNOSIS — J4489 Other specified chronic obstructive pulmonary disease: Secondary | ICD-10-CM | POA: Diagnosis not present

## 2014-08-02 DIAGNOSIS — K5730 Diverticulosis of large intestine without perforation or abscess without bleeding: Secondary | ICD-10-CM | POA: Insufficient documentation

## 2014-08-02 DIAGNOSIS — E782 Mixed hyperlipidemia: Secondary | ICD-10-CM | POA: Diagnosis not present

## 2014-08-02 DIAGNOSIS — K219 Gastro-esophageal reflux disease without esophagitis: Secondary | ICD-10-CM | POA: Diagnosis not present

## 2014-08-02 DIAGNOSIS — Z860100 Personal history of colon polyps, unspecified: Secondary | ICD-10-CM | POA: Diagnosis not present

## 2014-08-02 DIAGNOSIS — J45909 Unspecified asthma, uncomplicated: Secondary | ICD-10-CM | POA: Diagnosis not present

## 2014-08-02 DIAGNOSIS — Z79899 Other long term (current) drug therapy: Secondary | ICD-10-CM | POA: Diagnosis not present

## 2014-08-02 DIAGNOSIS — Z7982 Long term (current) use of aspirin: Secondary | ICD-10-CM | POA: Insufficient documentation

## 2014-08-02 DIAGNOSIS — J449 Chronic obstructive pulmonary disease, unspecified: Secondary | ICD-10-CM | POA: Insufficient documentation

## 2014-08-02 DIAGNOSIS — D128 Benign neoplasm of rectum: Secondary | ICD-10-CM | POA: Diagnosis not present

## 2014-08-02 DIAGNOSIS — D129 Benign neoplasm of anus and anal canal: Secondary | ICD-10-CM

## 2014-08-02 DIAGNOSIS — Z87891 Personal history of nicotine dependence: Secondary | ICD-10-CM | POA: Diagnosis not present

## 2014-08-02 DIAGNOSIS — D126 Benign neoplasm of colon, unspecified: Secondary | ICD-10-CM | POA: Diagnosis not present

## 2014-08-02 HISTORY — PX: COLONOSCOPY: SHX5424

## 2014-08-02 SURGERY — COLONOSCOPY
Anesthesia: Moderate Sedation

## 2014-08-02 MED ORDER — MIDAZOLAM HCL 5 MG/5ML IJ SOLN
INTRAMUSCULAR | Status: DC | PRN
Start: 2014-08-02 — End: 2014-08-02
  Administered 2014-08-02 (×3): 1 mg via INTRAVENOUS
  Administered 2014-08-02: 2 mg via INTRAVENOUS

## 2014-08-02 MED ORDER — SODIUM CHLORIDE 0.9 % IV SOLN
INTRAVENOUS | Status: DC
  Administered 2014-08-02: 14:00:00 via INTRAVENOUS

## 2014-08-02 MED ORDER — ATROPINE SULFATE 1 MG/ML IJ SOLN
INTRAMUSCULAR | Status: AC
  Filled 2014-08-02: qty 1

## 2014-08-02 MED ORDER — MIDAZOLAM HCL 5 MG/5ML IJ SOLN
INTRAMUSCULAR | Status: AC
  Filled 2014-08-02: qty 10

## 2014-08-02 MED ORDER — MEPERIDINE HCL 100 MG/ML IJ SOLN
INTRAMUSCULAR | Status: DC | PRN
  Administered 2014-08-02: 50 mg via INTRAVENOUS
  Administered 2014-08-02: 25 mg via INTRAVENOUS

## 2014-08-02 MED ORDER — SIMETHICONE 40 MG/0.6ML PO SUSP
ORAL | Status: DC | PRN
  Administered 2014-08-02: 14:00:00

## 2014-08-02 MED ORDER — ONDANSETRON HCL 4 MG/2ML IJ SOLN
INTRAMUSCULAR | Status: DC | PRN
  Administered 2014-08-02: 4 mg via INTRAVENOUS

## 2014-08-02 MED ORDER — ATROPINE SULFATE 1 MG/ML IJ SOLN
INTRAMUSCULAR | Status: DC | PRN
  Administered 2014-08-02: .25 mg via INTRAVENOUS

## 2014-08-02 MED ORDER — ONDANSETRON HCL 4 MG/2ML IJ SOLN
INTRAMUSCULAR | Status: AC
  Filled 2014-08-02: qty 2

## 2014-08-02 MED ORDER — MEPERIDINE HCL 100 MG/ML IJ SOLN
INTRAMUSCULAR | Status: AC
  Filled 2014-08-02: qty 2

## 2014-08-02 NOTE — Discharge Instructions (Signed)
Diverticulosis information provided Colon polyp info provided Further recommendations to follow after review of the pathology report    Diverticulosis Diverticulosis is the condition that develops when small pouches (diverticula) form in the wall of your colon. Your colon, or large intestine, is where water is absorbed and stool is formed. The pouches form when the inside layer of your colon pushes through weak spots in the outer layers of your colon. CAUSES  No one knows exactly what causes diverticulosis. RISK FACTORS  Being older than 34. Your risk for this condition increases with age. Diverticulosis is rare in people younger than 40 years. By age 83, almost everyone has it.  Eating a low-fiber diet.  Being frequently constipated.  Being overweight.  Not getting enough exercise.  Smoking.  Taking over-the-counter pain medicines, like aspirin and ibuprofen. SYMPTOMS  Most people with diverticulosis do not have symptoms. DIAGNOSIS  Because diverticulosis often has no symptoms, health care providers often discover the condition during an exam for other colon problems. In many cases, a health care provider will diagnose diverticulosis while using a flexible scope to examine the colon (colonoscopy). TREATMENT  If you have never developed an infection related to diverticulosis, you may not need treatment. If you have had an infection before, treatment may include:  Eating more fruits, vegetables, and grains.  Taking a fiber supplement.  Taking a live bacteria supplement (probiotic).  Taking medicine to relax your colon. HOME CARE INSTRUCTIONS   Drink at least 6-8 glasses of water each day to prevent constipation.  Try not to strain when you have a bowel movement.  Keep all follow-up appointments. If you have had an infection before:  Increase the fiber in your diet as directed by your health care provider or dietitian.  Take a dietary fiber supplement if your health  care provider approves.  Only take medicines as directed by your health care provider. SEEK MEDICAL CARE IF:   You have abdominal pain.  You have bloating.  You have cramps.  You have not gone to the bathroom in 3 days. SEEK IMMEDIATE MEDICAL CARE IF:   Your pain gets worse.  Yourbloating becomes very bad.  You have a fever or chills, and your symptoms suddenly get worse.  You begin vomiting.  You have bowel movements that are bloody or black. MAKE SURE YOU:  Understand these instructions.  Will watch your condition.  Will get help right away if you are not doing well or get worse. Document Released: 07/23/2004 Document Revised: 10/31/2013 Document Reviewed: 09/20/2013 [facility] Patient Information 2015 [place], [place]. This information is not intended to replace advice given to you by your health care provider. Make sure you discuss any questions you have with your health care provider. Colon Polyps Polyps are lumps of extra tissue growing inside the body. Polyps can grow in the large intestine (colon). Most colon polyps are noncancerous (benign). However, some colon polyps can become cancerous over time. Polyps that are larger than a pea may be harmful. To be safe, caregivers remove and test all polyps. CAUSES  Polyps form when mutations in the genes cause your cells to grow and divide even though no more tissue is needed. RISK FACTORS There are a number of risk factors that can increase your chances of getting colon polyps. They include:  Being older than 50 years.  Family history of colon polyps or colon cancer.  Long-term colon diseases, such as colitis or Crohn disease.  Being overweight.  Smoking.  Being inactive.  Drinking too much alcohol. SYMPTOMS  Most small polyps do not cause symptoms. If symptoms are present, they may include:  Blood in the stool. The stool may look dark red or black.  Constipation or diarrhea that lasts longer than 1  week. DIAGNOSIS People often do not know they have polyps until their caregiver finds them during a regular checkup. Your caregiver can use 4 tests to check for polyps:  Digital rectal exam. The caregiver wears gloves and feels inside the rectum. This test would find polyps only in the rectum.  Barium enema. The caregiver puts a liquid called barium into your rectum before taking X-rays of your colon. Barium makes your colon look white. Polyps are dark, so they are easy to see in the X-ray pictures.  Sigmoidoscopy. A thin, flexible tube (sigmoidoscope) is placed into your rectum. The sigmoidoscope has a light and tiny camera in it. The caregiver uses the sigmoidoscope to look at the last third of your colon.  Colonoscopy. This test is like sigmoidoscopy, but the caregiver looks at the entire colon. This is the most common method for finding and removing polyps. TREATMENT  Any polyps will be removed during a sigmoidoscopy or colonoscopy. The polyps are then tested for cancer. PREVENTION  To help lower your risk of getting more colon polyps:  Eat plenty of fruits and vegetables. Avoid eating fatty foods.  Do not smoke.  Avoid drinking alcohol.  Exercise every day.  Lose weight if recommended by your caregiver.  Eat plenty of calcium and folate. Foods that are rich in calcium include milk, cheese, and broccoli. Foods that are rich in folate include chickpeas, kidney beans, and spinach. HOME CARE INSTRUCTIONS Keep all follow-up appointments as directed by your caregiver. You may need periodic exams to check for polyps. SEEK MEDICAL CARE IF: You notice bleeding during a bowel movement. Document Released: 07/22/2004 Document Revised: 01/18/2012 Document Reviewed: 01/05/2012 [facility] Patient Information 2015 [place], [place]. This information is not intended to replace advice given to you by your health care provider. Make sure you discuss any questions you have with your health care  provider. Colonoscopy, Care After These instructions give you information on caring for yourself after your procedure. Your doctor may also give you more specific instructions. Call your doctor if you have any problems or questions after your procedure. HOME CARE  Do not drive for 24 hours.  Do not sign important papers or use machinery for 24 hours.  You may shower.  You may go back to your usual activities, but go slower for the first 24 hours.  Take rest breaks often during the first 24 hours.  Walk around or use warm packs on your belly (abdomen) if you have belly cramping or gas.  Drink enough fluids to keep your pee (urine) clear or pale yellow.  Resume your normal diet. Avoid heavy or fried foods.  Avoid drinking alcohol for 24 hours or as told by your doctor.  Only take medicines as told by your doctor. If a tissue sample (biopsy) was taken during the procedure:   Do not take aspirin or blood thinners for 7 days, or as told by your doctor.  Do not drink alcohol for 7 days, or as told by your doctor.  Eat soft foods for the first 24 hours. GET HELP IF: You still have a small amount of blood in your poop (stool) 2-3 days after the procedure. GET HELP RIGHT AWAY IF:  You have more than a small amount of  blood in your poop.  You see clumps of tissue (blood clots) in your poop.  Your belly is puffy (swollen).  You feel sick to your stomach (nauseous) or throw up (vomit).  You have a fever.  You have belly pain that gets worse and medicine does not help. MAKE SURE YOU:  Understand these instructions.  Will watch your condition.  Will get help right away if you are not doing well or get worse. Document Released: 11/28/2010 Document Revised: 10/31/2013 Document Reviewed: 07/03/2013 [facility] Patient Information 2015 [place], [place]. This information is not intended to replace advice given to you by your health care provider. Make sure you discuss any questions you  have with your health care provider.

## 2014-08-02 NOTE — Op Note (Signed)
[facility] [address] [place], [address]   COLONOSCOPY PROCEDURE REPORT  PATIENT: Miguel Morales, Miguel Morales  MR#: [id number] BIRTHDATE: January 21, 1946 , 83  yrs. old GENDER: male ENDOSCOPIST: R.  Garfield Cornea, MD FACP [facility] REFERRED RK:[id number] Wolfgang Phoenix, M.D. PROCEDURE DATE:  08/02/2014 PROCEDURE:   Colonoscopy with biopsy INDICATIONS:surveillance colonoscopy based on a history of adenomatous colonic polyp(s). MEDICATIONS: Versed 5 mg IV and Meperidine (Demerol) 75 mg  Zofran 4 mg IV. Atropine 0.25 mg prior to the procedure to prevent bradycardia. ASA CLASS:       Class II  CONSENT: The risks, benefits, alternatives and imponderables including but not limited to bleeding, perforation as well as the possibility of a missed lesion have been reviewed.  The potential for biopsy, lesion removal, etc. have also been discussed. Questions have been answered.  All parties agreeable.  Please see the history and physical in the medical record for more information.  DESCRIPTION OF PROCEDURE:   After the risks benefits and alternatives of the procedure were thoroughly explained, informed consent was obtained.  The digital rectal exam revealed no abnormalities of the rectum.   The EC-[id number] ([id number])  endoscope was introduced through the anus and advanced to the cecum. No adverse events experienced.   The quality of the prep was adequate. The instrument was then slowly withdrawn as the colon was fully examined.      COLON FINDINGS: (1) diminutive polyp found in the rectum at 5 cm from the anal verge.  patient scattered left-sided diverticula; otherwise, the remainder of the colonic mucosa appeared normal. Retroflexed views revealed no abnormalities. .  The rectal polyp was removed with cold biopsy technique.  Withdrawal time=12 minutes 0 seconds.  The scope was withdrawn and the procedure completed. COMPLICATIONS: There were no complications.  ENDOSCOPIC IMPRESSION: Rectal  polyp - removed as described above  RECOMMENDATIONS: Await pathology results  eSigned:  R. Garfield Cornea, MD FACP [facility] 08/02/2014 3:02 PM   cc:   PATIENT NAME:  Mckennon, Zwart MR#: [id number]

## 2014-08-02 NOTE — H&P (Signed)
@LOGO @   Primary Care Physician:  Sallee Lange, MD Primary Gastroenterologist:  Dr. Gala Romney  Pre-Procedure History & Physical: HPI:  Miguel Morales is a 68 y.o. male is here for a surveillance colonoscopy. History of distant colonic adenoma. Last colonoscopy was negative for polyps (positive for diverticulosis).  No bowel symptoms.  Past Medical History  Diagnosis Date  . COPD (chronic obstructive pulmonary disease)   . Mixed hyperlipidemia   . GERD (gastroesophageal reflux disease)   . Cancer   . Asthma     Past Surgical History  Procedure Laterality Date  . Knee arthroscopy    . Septoplasty    . Circumcision    . Colonoscopy  03/04/2004    JHE:[id number]-CXKGY diverticula.  The remainder of the colonic mucosa appeared normal/normal rectum  . Colonoscopy  05/22/2009    JEH:[id number] mucosa appeared normal/Left-sided diverticula/Normal rectum    Prior to Admission medications   Medication Sig Start Date End Date Taking? Authorizing Provider  albuterol-ipratropium (COMBIVENT) 18-103 MCG/ACT inhaler Inhale 2 puffs into the lungs every 6 (six) hours as needed for wheezing. 07/02/14  Yes [name], MD  amLODipine (NORVASC) 10 MG tablet Take 1 tablet (10 mg total) by mouth daily. 07/02/14  Yes [name], MD  Ascorbic Acid (VITAMIN C) 1000 MG tablet Take 1,000 mg by mouth daily.   Yes Historical Provider, MD  aspirin 81 MG tablet Take 81 mg by mouth daily.   Yes Historical Provider, MD  atorvastatin (LIPITOR) 20 MG tablet Take one tablet every other day 07/02/14  Yes [name], MD  Coenzyme Q10 (COQ10) 400 MG CAPS Take by mouth daily.   Yes Historical Provider, MD  Flaxseed, Linseed, (FLAXSEED OIL) 1000 MG CAPS Take 1 capsule by mouth daily.   Yes Historical Provider, MD  fluticasone (FLONASE) 50 MCG/ACT nasal spray Place 1 spray into both nostrils daily as needed. 01/19/14  Yes [name], MD  hydrOXYzine (ATARAX/VISTARIL) 10 MG tablet Take 10 mg by mouth 3 (three) times  daily as needed. 2 tablets once daily   Yes Historical Provider, MD  Loratadine 10 MG CAPS Take by mouth daily. PRN   Yes Historical Provider, MD  methocarbamol (ROBAXIN) 500 MG tablet Take 500 mg by mouth daily as needed for muscle spasms.   Yes Historical Provider, MD  omeprazole (PRILOSEC) 20 MG capsule Take 20 mg by mouth daily as needed. 07/02/14  Yes [name], MD  tiotropium (SPIRIVA HANDIHALER) 18 MCG inhalation capsule Place 1 capsule (18 mcg total) into inhaler and inhale 1 day or 1 dose. 07/02/14  Yes [name], MD    Allergies as of 07/12/2014 - Review Complete 07/12/2014  Allergen Reaction Noted  . Sulfa antibiotics  12/28/2011    Family History  Problem Relation Age of Onset  . Cancer Mother     History   Social History  . Marital Status: Married    Spouse Name: N/A    Number of Children: N/A  . Years of Education: N/A   Occupational History  . Not on file.   Social History Main Topics  . Smoking status: Former Smoker -- 1.00 packs/day for 30 years    Types: Cigarettes    Quit date: 11/10/2003  . Smokeless tobacco: Never Used  . Alcohol Use: Yes     Comment: Occasionally  . Drug Use: No  . Sexual Activity: Not on file   Other Topics Concern  . Not on file   Social History Narrative  .  No narrative on file    Review of Systems: See HPI, otherwise negative ROS  Physical Exam: BP 149/95  Pulse 74  Temp(Src) 98 F (36.7 C) (Oral)  Resp 20  SpO2 95% General:   Alert,  Well-developed, well-nourished, pleasant and cooperative in NAD Head:  Normocephalic and atraumatic. Eyes:  Sclera clear, no icterus.   Conjunctiva pink. Ears:  Normal auditory acuity. Nose:  No deformity, discharge,  or lesions. Mouth:  No deformity or lesions, dentition normal. Neck:  Supple; no masses or thyromegaly. Lungs:  Clear throughout to auscultation.   No wheezes, crackles, or rhonchi. No acute distress. Heart:  Regular rate and rhythm; no murmurs, clicks, rubs,   or gallops. Abdomen:  Soft, nontender and nondistended. No masses, hepatosplenomegaly or hernias noted. Normal bowel sounds, without guarding, and without rebound.   Msk:  Symmetrical without gross deformities. Normal posture. Pulses:  Normal pulses noted. Extremities:  Without clubbing or edema. Neurologic:  Alert and  oriented x4;  grossly normal neurologically. Skin:  Intact without significant lesions or rashes. Cervical Nodes:  No significant cervical adenopathy. Psych:  Alert and cooperative. Normal mood and affect.  Impression/Plan: Miguel Morales is now here to undergo a surveillance colonoscopy. Risks, benefits, limitations, imponderables and alternatives regarding colonoscopy have been reviewed with the patient. Questions have been answered. All parties agreeable.        Notice:  This dictation was prepared with Dragon dictation along with smaller phrase technology. Any transcriptional errors that result from this process are unintentional and may not be corrected upon review.

## 2014-08-03 ENCOUNTER — Encounter (HOSPITAL_COMMUNITY): Payer: Self-pay | Admitting: Internal Medicine

## 2014-08-07 ENCOUNTER — Encounter: Payer: Self-pay | Admitting: Internal Medicine

## 2014-08-07 ENCOUNTER — Other Ambulatory Visit: Payer: Self-pay | Admitting: Family Medicine

## 2014-08-07 DIAGNOSIS — J351 Hypertrophy of tonsils: Secondary | ICD-10-CM | POA: Diagnosis not present

## 2014-08-07 MED ORDER — OMEPRAZOLE 20 MG PO CPDR: 20.0000 mg | DELAYED_RELEASE_CAPSULE | Freq: Every day | ORAL | Status: DC | PRN

## 2014-08-08 ENCOUNTER — Telehealth: Payer: Self-pay | Admitting: Emergency Medicine

## 2014-08-08 NOTE — Telephone Encounter (Signed)
Received fax on [name] to send records to [employer] for reimbursement from [facility] for the [place]. The company is requesting a letter of medical necessity that MR will need to provide. Once letter is completed I will be able to fax in all the required documents. Will forward to MR.   MR, please write a letter of medical necessity for the pt's INDI test that was preformed.

## 2014-08-10 ENCOUNTER — Encounter: Payer: Self-pay | Admitting: Family Medicine

## 2014-08-10 ENCOUNTER — Ambulatory Visit (INDEPENDENT_AMBULATORY_CARE_PROVIDER_SITE_OTHER): Payer: Medicare Other | Admitting: Family Medicine

## 2014-08-10 VITALS — BP 132/84 | Temp 98.8°F | Ht 66.0 in | Wt 206.0 lb

## 2014-08-10 DIAGNOSIS — J329 Chronic sinusitis, unspecified: Secondary | ICD-10-CM

## 2014-08-10 MED ORDER — AMOXICILLIN-POT CLAVULANATE 875-125 MG PO TABS: 1.0000 | ORAL_TABLET | Freq: Two times a day (BID) | ORAL | Status: AC

## 2014-08-10 NOTE — Progress Notes (Signed)
   Subjective:    Patient ID: Miguel Morales, male    DOB: 08/25/1946, 68 y.o.   MRN: [id number]  Cough This is a new problem. The current episode started in the past 7 days. Associated symptoms include chills, ear congestion, a fever, headaches, nasal congestion, a sore throat and wheezing. Treatments tried: tylenol, tramadol, loratadine.    Energy level down  achinfg all over  Cough and congestion   headeache off and on, not very bad  Little prod as far as cough  Right nostil bloody disch  combivent prn   Uses spiriva daily and then combivent as needed  Review of Systems  Constitutional: Positive for fever and chills.  HENT: Positive for sore throat.   Respiratory: Positive for cough and wheezing.   Neurological: Positive for headaches.       Objective:   Physical Exam  Place mild malaise HEENT not normal. Frontal congestion tenderness. TMs retracted. Pharynx erythematous Vital stable. Lungs rare wheeze heart rare rhythm.      Assessment & Plan:  An in and in impression rhinosinusitis/bronchitis with reactive airways plan antibiotics prescribed. Symptomatic care discussed. WSL

## 2014-08-16 ENCOUNTER — Encounter (HOSPITAL_COMMUNITY): Payer: Self-pay

## 2014-08-16 ENCOUNTER — Encounter (HOSPITAL_COMMUNITY)
Admission: RE | Admit: 2014-08-16 | Discharge: 2014-08-16 | Disposition: A | Discharge status: Home / Self Care | Payer: Medicare Other | Source: Ambulatory Visit | Attending: Internal Medicine | Admitting: Internal Medicine

## 2014-08-16 VITALS — BP 130/80 | HR 85 | Ht 66.0 in | Wt 201.7 lb

## 2014-08-16 DIAGNOSIS — Z5189 Encounter for other specified aftercare: Secondary | ICD-10-CM | POA: Diagnosis not present

## 2014-08-16 DIAGNOSIS — R0600 Dyspnea, unspecified: Secondary | ICD-10-CM | POA: Diagnosis not present

## 2014-08-16 NOTE — Progress Notes (Signed)
Patient referred to Pulmonary Rehab by Dr. Chase Caller due to Dyspnea. During orientation advised patient on arrival and appointment times what to wear, what to do before, during and after exercise. Reviewed attendance and class policy. Talked about inclement weather and class consultation policy. Pt is scheduled to start Pulm. Rehab on 08/20/14 at 2:30pm. Pt was advised to come to class 5 minutes before class starts. He was also given instructions on meeting with the dietician and attending the Family Structure classes. Pt is eager to get started. Patient able to complete 6 minute walk test. Discussed pursed lip breathing with patient and handouts given to demonstrate technique.

## 2014-08-16 NOTE — Patient Instructions (Signed)
Pt has finished orientation and is scheduled to start PR on 08/20/14 at 2:30. Pt has been instructed to arrive to class 15 minutes early for scheduled class. Pt has been instructed to wear comfortable clothing and shoes with rubber soles. Pt has been told to take their medications 1 hour prior to coming to class.  If the patient is not going to attend class, he/she has been instructed to call.

## 2014-08-20 ENCOUNTER — Encounter (HOSPITAL_COMMUNITY)
Admission: RE | Admit: 2014-08-20 | Discharge: 2014-08-20 | Disposition: A | Discharge status: Home / Self Care | Payer: Medicare Other | Source: Ambulatory Visit | Attending: Internal Medicine | Admitting: Internal Medicine

## 2014-08-20 DIAGNOSIS — Z5189 Encounter for other specified aftercare: Secondary | ICD-10-CM | POA: Diagnosis not present

## 2014-08-22 ENCOUNTER — Encounter (HOSPITAL_COMMUNITY)
Admission: RE | Admit: 2014-08-22 | Discharge: 2014-08-22 | Disposition: A | Discharge status: Home / Self Care | Payer: Medicare Other | Source: Ambulatory Visit | Attending: Internal Medicine | Admitting: Internal Medicine

## 2014-08-22 DIAGNOSIS — Z5189 Encounter for other specified aftercare: Secondary | ICD-10-CM | POA: Diagnosis not present

## 2014-08-24 ENCOUNTER — Telehealth: Payer: Self-pay | Admitting: Family Medicine

## 2014-08-24 ENCOUNTER — Other Ambulatory Visit: Payer: Self-pay

## 2014-08-24 NOTE — Telephone Encounter (Signed)
Charted under immunizations

## 2014-08-24 NOTE — Telephone Encounter (Signed)
Pt had his flu shot at the [place] office on 08/07/14   Just an [place]

## 2014-08-27 ENCOUNTER — Encounter (HOSPITAL_COMMUNITY)
Admission: RE | Admit: 2014-08-27 | Discharge: 2014-08-27 | Disposition: A | Discharge status: Home / Self Care | Payer: Medicare Other | Source: Ambulatory Visit | Attending: Internal Medicine | Admitting: Internal Medicine

## 2014-08-27 DIAGNOSIS — Z5189 Encounter for other specified aftercare: Secondary | ICD-10-CM | POA: Diagnosis not present

## 2014-08-28 NOTE — Progress Notes (Signed)
Pulmonary Rehabilitation Program Outcomes Report   Orientation:  08/16/14 Graduate Date:  tbd Discharge Date:  tbd # of sessions completed: 3  Pulmonologist: ramaswamy Family MD:  Wolfgang Phoenix Class Time:  1430  A.  Exercise Program:  Tolerates exercise @ 3.62 METS for 15 minutes and Walk Test Results:  Pre: 2.7 mets  B.  Mental Health:  Good mental attitude  C.  Education/Instruction/Skills  Accurately checks own pulse.  Rest:  83  Exercise:  86  Uses Perceived Exertion Scale and/or Dyspnea Scale  D.  Nutrition/Weight Control/Body Composition:  Adherence to prescribed nutrition program: good    E.  Blood Lipids    Lab Results  Component Value Date   CHOL 153 04/12/2014   HDL 36* 04/12/2014   LDLCALC 102* 04/12/2014   TRIG 73 04/12/2014   CHOLHDL 4.3 04/12/2014    F.  Lifestyle Changes:  Making positive lifestyle changes and Not smoking:  Quit 2010  G.  Symptoms noted with exercise:  Asymptomatic  Report Completed By:  Benay Pillow RN    Comments:  First week progress note.

## 2014-08-29 ENCOUNTER — Encounter (HOSPITAL_COMMUNITY)
Admission: RE | Admit: 2014-08-29 | Discharge: 2014-08-29 | Disposition: A | Discharge status: Home / Self Care | Payer: Medicare Other | Source: Ambulatory Visit | Attending: Internal Medicine | Admitting: Internal Medicine

## 2014-08-29 ENCOUNTER — Other Ambulatory Visit: Payer: Self-pay | Admitting: Family Medicine

## 2014-08-29 DIAGNOSIS — Z5189 Encounter for other specified aftercare: Secondary | ICD-10-CM | POA: Diagnosis not present

## 2014-09-03 ENCOUNTER — Encounter (HOSPITAL_COMMUNITY)
Admission: RE | Admit: 2014-09-03 | Discharge: 2014-09-03 | Disposition: A | Discharge status: Home / Self Care | Payer: Medicare Other | Source: Ambulatory Visit | Attending: Internal Medicine | Admitting: Internal Medicine

## 2014-09-03 DIAGNOSIS — Z5189 Encounter for other specified aftercare: Secondary | ICD-10-CM | POA: Diagnosis not present

## 2014-09-05 ENCOUNTER — Encounter (HOSPITAL_COMMUNITY)
Admission: RE | Admit: 2014-09-05 | Discharge: 2014-09-05 | Disposition: A | Discharge status: Home / Self Care | Payer: Medicare Other | Source: Ambulatory Visit | Attending: Internal Medicine | Admitting: Internal Medicine

## 2014-09-05 DIAGNOSIS — Z5189 Encounter for other specified aftercare: Secondary | ICD-10-CM | POA: Diagnosis not present

## 2014-09-10 ENCOUNTER — Encounter (HOSPITAL_COMMUNITY)
Admission: RE | Admit: 2014-09-10 | Discharge: 2014-09-10 | Disposition: A | Discharge status: Home / Self Care | Payer: Medicare Other | Source: Ambulatory Visit | Attending: Internal Medicine | Admitting: Internal Medicine

## 2014-09-10 DIAGNOSIS — R0600 Dyspnea, unspecified: Secondary | ICD-10-CM | POA: Diagnosis not present

## 2014-09-10 DIAGNOSIS — Z5189 Encounter for other specified aftercare: Secondary | ICD-10-CM | POA: Diagnosis not present

## 2014-09-12 ENCOUNTER — Encounter (HOSPITAL_COMMUNITY)
Admission: RE | Admit: 2014-09-12 | Discharge: 2014-09-12 | Disposition: A | Discharge status: Home / Self Care | Payer: Medicare Other | Source: Ambulatory Visit | Attending: Internal Medicine | Admitting: Internal Medicine

## 2014-09-12 DIAGNOSIS — Z5189 Encounter for other specified aftercare: Secondary | ICD-10-CM | POA: Diagnosis not present

## 2014-09-17 ENCOUNTER — Encounter (HOSPITAL_COMMUNITY)
Admission: RE | Admit: 2014-09-17 | Discharge: 2014-09-17 | Disposition: A | Discharge status: Home / Self Care | Payer: Medicare Other | Source: Ambulatory Visit | Attending: Internal Medicine | Admitting: Internal Medicine

## 2014-09-17 DIAGNOSIS — Z5189 Encounter for other specified aftercare: Secondary | ICD-10-CM | POA: Diagnosis not present

## 2014-09-19 ENCOUNTER — Encounter (HOSPITAL_COMMUNITY)
Admission: RE | Admit: 2014-09-19 | Discharge: 2014-09-19 | Disposition: A | Discharge status: Home / Self Care | Payer: Medicare Other | Source: Ambulatory Visit | Attending: Internal Medicine | Admitting: Internal Medicine

## 2014-09-19 DIAGNOSIS — Z5189 Encounter for other specified aftercare: Secondary | ICD-10-CM | POA: Diagnosis not present

## 2014-09-24 ENCOUNTER — Encounter (HOSPITAL_COMMUNITY)
Admission: RE | Admit: 2014-09-24 | Discharge: 2014-09-24 | Disposition: A | Discharge status: Home / Self Care | Payer: Medicare Other | Source: Ambulatory Visit | Attending: Internal Medicine | Admitting: Internal Medicine

## 2014-09-24 DIAGNOSIS — Z5189 Encounter for other specified aftercare: Secondary | ICD-10-CM | POA: Diagnosis not present

## 2014-09-26 ENCOUNTER — Encounter (HOSPITAL_COMMUNITY)
Admission: RE | Admit: 2014-09-26 | Discharge: 2014-09-26 | Disposition: A | Discharge status: Home / Self Care | Payer: Medicare Other | Source: Ambulatory Visit | Attending: Internal Medicine | Admitting: Internal Medicine

## 2014-09-26 DIAGNOSIS — Z5189 Encounter for other specified aftercare: Secondary | ICD-10-CM | POA: Diagnosis not present

## 2014-10-01 ENCOUNTER — Encounter (HOSPITAL_COMMUNITY)
Admission: RE | Admit: 2014-10-01 | Discharge: 2014-10-01 | Disposition: A | Discharge status: Home / Self Care | Payer: Medicare Other | Source: Ambulatory Visit | Attending: Internal Medicine | Admitting: Internal Medicine

## 2014-10-01 DIAGNOSIS — Z5189 Encounter for other specified aftercare: Secondary | ICD-10-CM | POA: Diagnosis not present

## 2014-10-02 ENCOUNTER — Ambulatory Visit (INDEPENDENT_AMBULATORY_CARE_PROVIDER_SITE_OTHER)
Admission: RE | Admit: 2014-10-02 | Discharge: 2014-10-02 | Disposition: A | Discharge status: Home / Self Care | Payer: Medicare Other | Source: Ambulatory Visit | Attending: Internal Medicine | Admitting: Internal Medicine

## 2014-10-02 DIAGNOSIS — R911 Solitary pulmonary nodule: Secondary | ICD-10-CM

## 2014-10-03 ENCOUNTER — Encounter (HOSPITAL_COMMUNITY)
Admission: RE | Admit: 2014-10-03 | Discharge: 2014-10-03 | Disposition: A | Discharge status: Home / Self Care | Payer: Medicare Other | Source: Ambulatory Visit | Attending: Internal Medicine | Admitting: Internal Medicine

## 2014-10-03 DIAGNOSIS — Z5189 Encounter for other specified aftercare: Secondary | ICD-10-CM | POA: Diagnosis not present

## 2014-10-03 NOTE — Telephone Encounter (Signed)
aSk him to keep his appt 10/09/14. Nodule is unchanged. Also, please check with him MARQUAVIUS SCAIFE if the insurance on the blood test was taken care of? I believe company should have sent a letter to insurance with my signature on it; cannot remember  Also, please ask CT to do a SUPER-D disc on the 10/02/14 CT and give to me; might need ENB or Percepta test  Thanks  Dr. Brand Males, M.D., [facility].C.P Pulmonary and Critical Care Medicine Staff Physician [place] [facility] Pager: [phone], If no answer or between  15:00h - 7:00h: call [phone]  10/03/2014 8:14 AM     Ct Chest Wo Contrast  10/02/2014   CLINICAL DATA:  68 year old male with history of lung nodule. Followup study. One week history of posterior back and upper left chest pain.  EXAM: CT CHEST WITHOUT CONTRAST  TECHNIQUE: Multidetector CT imaging of the chest was performed following the standard protocol without IV contrast.  COMPARISON:  PET-CT 06/08/2014.  FINDINGS: Mediastinum: Heart size is normal. There is no significant pericardial fluid, thickening or pericardial calcification. There is atherosclerosis of the thoracic aorta, the great vessels of the mediastinum and the coronary arteries, including calcified atherosclerotic plaque in the left main, left anterior descending, left circumflex and right coronary arteries. No pathologically enlarged mediastinal or hilar lymph nodes. Please note that accurate exclusion of hilar adenopathy is limited on noncontrast CT scans. Esophagus is unremarkable in appearance.  Lungs/Pleura: Previously described ground-glass attenuation nodule in the left upper lobe is unchanged compared to prior study 05/09/2014, currently measuring 15 x 12 mm. This has no central solid component at this time. No other suspicious appearing pulmonary nodules or masses are otherwise noted. Specifically, previously noted right lower lobe nodule is no longer identified. No  acute consolidative airspace disease. No pleural effusions. Mild centrilobular emphysema.  Upper Abdomen: Atherosclerosis. Diffuse decreased attenuation in the hepatic parenchyma, compatible with mild hepatic steatosis. Small calcified granuloma in the liver.  Musculoskeletal: There are no aggressive appearing lytic or blastic lesions noted in the visualized portions of the skeleton.  IMPRESSION: 1. No acute findings to account for the patient's symptoms. 2. No change in 12 x 15 mm ground-glass attenuation nodule in the left upper lobe. This is reassuring, however, continued attention on future followup studies is recommended to ensure stability of this finding. Follow-up chest CT in 1 year is recommended at this time. This recommendation follows the consensus statement: Recommendations for the Management of Subsolid Pulmonary Nodules Detected at CT: A Statement from the Fleischner Society as published in Radiology 2013; 266:304-317. 3. Atherosclerosis, including left main and 3 vessel coronary artery disease. Assessment for potential risk factor modification, dietary therapy or pharmacologic therapy may be warranted, if clinically indicated. 4. Hepatic steatosis. 5. Additional incidental findings, as above.   Electronically Signed   By: Vinnie Langton M.D.   On: 10/02/2014 10:23

## 2014-10-08 ENCOUNTER — Encounter (HOSPITAL_COMMUNITY)
Admission: RE | Admit: 2014-10-08 | Discharge: 2014-10-08 | Disposition: A | Discharge status: Home / Self Care | Payer: Medicare Other | Source: Ambulatory Visit | Attending: Internal Medicine | Admitting: Internal Medicine

## 2014-10-08 ENCOUNTER — Telehealth: Payer: Self-pay | Admitting: Internal Medicine

## 2014-10-08 DIAGNOSIS — Z5189 Encounter for other specified aftercare: Secondary | ICD-10-CM | POA: Diagnosis not present

## 2014-10-08 NOTE — Telephone Encounter (Signed)
Duplicate note. Please see phone note from 08/08/14. Will sign off.

## 2014-10-08 NOTE — Telephone Encounter (Signed)
lmtcb for pt.   Called [facility] at [address] and was informed to call back at 1pm.

## 2014-10-08 NOTE — Telephone Encounter (Signed)
Called and spoke to pt. Pt stated he does not recall any info about blood test and insurance. Informed pt we can discuss it tomorrow at his appt time. Pt verbalized understanding and denied any further questions or concerns at this time.   Called and spoke to [place] at [place] and she stated she would burn disc today and have it sent over this afternoon to [place] [facility]. Nothing further needed at this time.

## 2014-10-09 ENCOUNTER — Encounter: Payer: Self-pay | Admitting: Internal Medicine

## 2014-10-09 ENCOUNTER — Ambulatory Visit (INDEPENDENT_AMBULATORY_CARE_PROVIDER_SITE_OTHER): Payer: Medicare Other | Admitting: Internal Medicine

## 2014-10-09 VITALS — BP 132/88 | HR 82 | Ht 66.0 in | Wt 206.0 lb

## 2014-10-09 DIAGNOSIS — R0600 Dyspnea, unspecified: Secondary | ICD-10-CM

## 2014-10-09 DIAGNOSIS — R911 Solitary pulmonary nodule: Secondary | ICD-10-CM | POA: Diagnosis not present

## 2014-10-09 NOTE — Progress Notes (Signed)
Subjective:    Patient ID: Miguel Morales, male    DOB: Mar 26, 1946, 68 y.o.   MRN: [id number]  HPI    IOV 01/11/2014 Chief Complaint  Patient presents with  . Pulmonary Consult    pt states he was diagnosed with COPD back in 2011 by Dr. Wolfgang Morales. He states his breathing has not been any better since then so wants to f/u with a speacial    68 year old male with a 30 pack smoking history. 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 He is here for dyspnea evaluation. He's also plan for disability through the [place].  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 At that time he was in his 47s and he was still smoking. Subsequently after return to the [place] sometime in the mid 90s he developed insidious onset of shortness of breath with exertion and relieved by rest. Since then dyspnea is been slowly progressive despite quitting smoking around 2004. In 2011 he had a pulmonary function test at [facility] for which I do not have the full results but apparently was told that he had COPD. In the last 5-10 years these have progressively more dyspnea and exertion. Currently is on Spiriva for maintenance along with Combivent twice a day for maintenance [note he takes 2 anticholinergics for maintenance]. He notices dyspnea for climbing 1 flight of stairs or doing yard work or using a chainsaw or lifting boxes 10# boxes  food at [employer]. The last 5-10 years is also gained 10 pounds of weight. He says that several years ago he could do these tasks without a problem he did is now sustained cough or chest pain or edema.   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 COPD + May 13: Normal nuclear medicine Myoview of the heart June 2013: Stress Myoview EKG is normal per report   reports that he quit smoking about 10 years ago. His smoking use included Cigarettes. He has a 30 pack-year smoking history. He has never used smokeless tobacco. >>set up pft    01/18/2014 Follow up w/ PFT  Patient returns for a followup visit to review to pulmonary  function test results. PFTs (s/p BD) showed an FEV1 2.45 L, 88%, ratio of 72;  FVC 90%, no significant bronchodilator response. Mid-flows diminished at 67%, with 28% , change, status post bronchodilator.  Diffusing capacity, diminished at 68% No signifcant change in symptoms. Has  SOB with mild activity, and dry cough x years. Wears out easily, gives out of stamina.  Denies any chest pain, hemoptysis, orthopnea, PND, or leg swelling Does have quite a bit of postnasal drip, and drainage  REC May use  Claritin 10mg  At bedtime  As needed  Drainage  May use Veramyst nasal for nasal congesiton  I will call regarding CT chest  follow up Dr. Chase Morales in 6 weeks and As needed  Late add : set up HR CT to look for ILD   OV 02/08/2014  Chief Complaint  Patient presents with  . Follow-up    Review CT chest done on tuesday.  Pt c/o SOB with exertion.  Denies chest congestion, tightness, wheezing.     Followup dyspnea workup in previous smoker and ex [place] War veteran.  PFT  test shows isolated reduction in diffusion capacity. High resolution CT scan of the chest done recently has no official report because the thoracic radiologist is on vacation. However to me it appears that there might be some supple bibasal right greater than left early reticular pattern of interstitial lung disease without  any specific honeycombing. 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 Follow-up    review cpst and ct chest.  c/o SOB with exertion.     Follow up dyspnea. Here to review results of CT scan of the chest and cardiopulmonary stress test. No new complaints. He presents with his wife Miguel Morales for the first time and she is interested in knowing the results of workup  CT chest 02/06/14 IMPRESSION:  1. No evidence of interstitial lung disease.  2. 10 x 13 mm left upper lobe ground-glass nodule. Initial follow-up  by chest CT without contrast is recommended in 3 months to confirm  persistence. This recommendation follows the consensus statement:  Recommendations for the Management of Subsolid Pulmonary Nodules  Detected at CT: A Statement from the [place] as published  in Radiology 2013; 266:304-317.  3. 4 mm right lower lobe nodule. Given risk factors for bronchogenic  carcinoma, follow-up chest CT at 1 year is recommended. This  recommendation follows the consensus statement: Guidelines for  Management of Small Pulmonary Nodules Detected on CT Scans: A  Statement from the Fleischner Society as published in Radiology  2005; 237:395-400.  4. Three-vessel coronary artery calcification.  Electronically Signed  By: Miguel Morales M.D.  On: 02/09/2014 11:29    CPST 02/14/14 Interpretation  Notes: Patient gave an excellent effort. The pulse-oximetry remained at 98% throughout the exercise. Exercise was performed on a cycle ergometer starting at [facility] and increasing by 12 W/min.  ECG: Resting ECG in normal rhythm. There was an adequate HR response to the exercise with no ST-T changes or sustained arrhythmias (rare, isolated  PVCs were noted, with one observed at peak exercise). There was an increase in ventricular ectopy during the 3rd minute of recovery. The BP response was appropriate.  PFT: Resting spirometry within normal limits, but a restrictive pattern was observed in a reduced ERV. The MVV was normal. Post-exercise spirometry was performed IPE and 5, 10, 15 and 20 (reported above) minutes of recovery with no changes to suggestive of EIB.  CPX: The RER of 1.16 indicates a maximal effort. The peak VO2 is normal at 20.6 ml/kg/min (98% of the age/gender/weight matched sedentary norm). When adjusted to the patients ideal body weight of 158 lb (71.7 kg) the peak VO2 is 26.2 ml/kg (ibw)/min (105% of the IBW adjusted predicted). The VO2 a the ventilatory threshold is normal at 62% of the predicted peak VO2. At peak exercise the ventilation was 72% of the measured MVV indicating ventilatory limits were being approached (respiratory rate was within  expected limits, Vt/IC [65%] was within expected limits, PETCO2 was below normal). As mentioned above there was an adequate HR response to the exercise with a HR exceeding age-predicted maximum by 2 bpm. The O2pulse (a surrogate for stroke volume) increased throughout the exercise reaching 13 ml/beat (100% of predicted). The VE/VCO2 slope is moderately elevated and suggests increased dead space ventilation and reduced ventilatory efficiency. The oxygen uptake efficiency slope (OUES) is normal and reflects the patient's measured functional capacity.  Conclusion: Exercise testing with gas exchange demonstrates a normal functional capacity when compared to matched sedentary norms. These data suggest deconditioning as the primary cause of the patient's perceived exercise intolerance. There is a borderline ventilatory limitation to the exercise, with some hyperventilation at peak exercise.     Report prepared by: Linzie Collin, PhD, ACSM-RCEP Sr. Exercise  Physiologist 02/20/2014 4:33 PM   #Shortness of breath - No evidence of ILD per radiologist but if is there, will show up on future CT chest  -Pulmonary stress test suggests that this is due to you slightly being overweight and due to physical deconditioning  - As discussed you will start an exercise program with the help of your friends at the [facility]; if this does not work then I will refer you to pulmonary rehab at [facility]  #Lung nodule 86mm Left Upper Lobe Ground Glass and 3mm Right Right Lower lobe - seen April 2015 and NEW problem  -= do CT chest early July 2015; 3rd month CT chest  #Followup - early July 2015 after ct chest    OV 07/03/2014  Chief Complaint  Patient presents with  . Follow-up    Pt here to review PET. Pt staes his breathing is unchanged. Pt c/o DOE, cough with intermittent mucous production, clear in color. Pt c/o mild and rare chest tightness with over exertion   FU   - dyspnea and LUL nodule   - dyspnea: CPST showed duye to obesity and deconditioning. I advised pulm rehab but he opted to work out at [employer]. Instead he is only sporadically working out at home. Therefore, dyspnea not better. I counseled and he is willing to attend rehab at [facility]  -  Lung nodule LUL 64mm and 65mm RLLL  -both new April 2015. We worked this up further with PET scan 05/11/10/5; indeterminate low prob. And with INDI Dx blood tests indiDx 05/31/14 puts probability of lung nodule being benign at 90%.     Past hx: Seeing Dr Erik Obey for fullness in left tonsil and otalgia found to have 68mm cystic lesion. S/p bx 03/19/14 and had atypical cells. THis is not seen in PET. Aparently he has been reassured this is benigng    OV 10/09/2014  Chief Complaint  Patient presents with  . Follow-up    Pt here to review CT scan. Pt states since last OV his breathing is unchanged. Pt c/o dry cough and chest tightness with exertion.     FU - dyspnea and LUL nodule   - dyspnea: CPST showed duye  to obesity and deconditioning. Stress test iun 2013 was normal.   - still dealing with dyspnea. Not better.    -  Lung nodule LUL 97mm and 35mm RLLL  -both new April 2015.   -  PET scan 05/11/10/5; indeterminate low prob.   - INDI Dx blood tests indiDx 05/31/14 puts probability of lung nodule being benign at 90% (new test with some validations  - HEr eto review CT chest today   -  CT cone 10/02/14: 1. No acute findings to account for the patient's symptoms. 2. No change in 12 x 15 mm ground-glass attenuation nodule in the left upper lobe. This is reassuring, however, continued attention on future followup studies is recommended to ensure stability of this finding. Follow-up chest CT in 1 year is recommended at this time.    Review of Systems  Constitutional: Negative for fever and unexpected weight change.  HENT: Negative for congestion, dental problem, ear pain, nosebleeds, postnasal drip, rhinorrhea, sinus pressure, sneezing, sore throat and trouble swallowing.   Eyes: Negative for redness and itching.  Respiratory: Positive for cough, chest tightness and shortness of breath. Negative for wheezing.   Cardiovascular: Negative for palpitations and leg swelling.  Gastrointestinal: Negative for nausea and vomiting.  Genitourinary: Negative for dysuria.  Musculoskeletal: Negative for joint swelling.  Skin: Negative for rash.  Neurological: Negative for headaches.  Hematological: Does not bruise/bleed easily.  Psychiatric/Behavioral: Negative for dysphoric mood. The patient is not nervous/anxious.        Objective:   Physical Exam  Filed Vitals:   10/09/14 0901  BP: 132/88  Pulse: 82  Height: 5\' 6"  (1.676 m)  Weight: 206 lb (93.441 kg)  SpO2: 97%   Focal exam  - AXOX3 - Normal breath sounds. No resp distress.       Assessment & Plan:     ICD-9-CM ICD-10-CM   1. Lung nodule 793.11 R91.1   2. Lung nodule 32mm Left Upper Lobe Ground Glass and 23mm Right Right Lower lobe - seen  April 2015 and NEW problem 793.11 R91.1   3. Dyspnea 786.09 R06.00    #Shortness of breath - No evidence of ILD   -Pulmonary stress test suggests that this is due to you slightly being overweight and due to physical deconditioning  - Continue weight loss and exercise therapy   #Lung nodule 69mm Left Upper Lobe Ground Glass and 35mm Right Right Lower lobe - seen April 2015 and NEW problem  - unchanged Nov 2015 Ct chest  - low prob for lung cancer based on evidence so far  - note: does not eliminate possibility of cancer  - You will still need close surveillance CT Chest scan  - Please do next CT chest wo contrast  September 2016 (discussed surveillance with CT  Versus doin bronch for percepta test and transbronchial biopsy and risks and limitaitons, he opted for continued surveilance)  #Followup - Sep 2016  after CT chest  (> 50% of this 15 min visit spent in face to face counseling)    Dr. Brand Males, M.D., [facility].C.P Pulmonary and Critical Care Medicine Staff Physician [facility] Pager: [phone], If no answer or between  15:00h - 7:00h: call [phone]  10/09/2014 9:33 AM

## 2014-10-09 NOTE — Patient Instructions (Addendum)
#  Shortness of breath - No evidence of Interstistial Lung Disease  -Pulmonary stress test suggests that this is due to you slightly being overweight and due to physical deconditioning  - Continue weight loss and exercise therapy   #Lung nodule 3mm Left Upper Lobe Ground Glass and 73mm Right Right Lower lobe - seen April 2015 and NEW problem  - unchanged Nov 2015 Ct chest  - low prob for lung cancer based on evidence so far  - note: does not eliminate possibility of cancer  - You will still need close surveillance CT Chest scan  - Please do next CT chest wo contrast  September 2016  #Followup - Sep 2016  after CT chest

## 2014-10-10 ENCOUNTER — Encounter (HOSPITAL_COMMUNITY)
Admission: RE | Admit: 2014-10-10 | Discharge: 2014-10-10 | Disposition: A | Discharge status: Home / Self Care | Payer: Medicare Other | Source: Ambulatory Visit | Attending: Internal Medicine | Admitting: Internal Medicine

## 2014-10-10 DIAGNOSIS — R0600 Dyspnea, unspecified: Secondary | ICD-10-CM | POA: Diagnosis not present

## 2014-10-10 DIAGNOSIS — Z5189 Encounter for other specified aftercare: Secondary | ICD-10-CM | POA: Diagnosis not present

## 2014-10-15 ENCOUNTER — Encounter (HOSPITAL_COMMUNITY)
Admission: RE | Admit: 2014-10-15 | Discharge: 2014-10-15 | Disposition: A | Discharge status: Home / Self Care | Payer: Medicare Other | Source: Ambulatory Visit | Attending: Internal Medicine | Admitting: Internal Medicine

## 2014-10-15 ENCOUNTER — Encounter: Payer: Self-pay | Admitting: Family Medicine

## 2014-10-15 ENCOUNTER — Ambulatory Visit (INDEPENDENT_AMBULATORY_CARE_PROVIDER_SITE_OTHER): Payer: Medicare Other | Admitting: Family Medicine

## 2014-10-15 VITALS — BP 122/86 | Temp 98.9°F | Ht 66.0 in | Wt 205.0 lb

## 2014-10-15 DIAGNOSIS — J441 Chronic obstructive pulmonary disease with (acute) exacerbation: Secondary | ICD-10-CM

## 2014-10-15 DIAGNOSIS — J019 Acute sinusitis, unspecified: Secondary | ICD-10-CM

## 2014-10-15 DIAGNOSIS — Z5189 Encounter for other specified aftercare: Secondary | ICD-10-CM | POA: Diagnosis not present

## 2014-10-15 DIAGNOSIS — B9689 Other specified bacterial agents as the cause of diseases classified elsewhere: Secondary | ICD-10-CM

## 2014-10-15 MED ORDER — AMOXICILLIN-POT CLAVULANATE 875-125 MG PO TABS: 1.0000 | ORAL_TABLET | Freq: Two times a day (BID) | ORAL | Status: DC

## 2014-10-15 MED ORDER — HYDROCODONE-HOMATROPINE 5-1.5 MG/5ML PO SYRP: ORAL_SOLUTION | ORAL | Status: DC

## 2014-10-15 NOTE — Progress Notes (Signed)
   Subjective:    Patient ID: Miguel Morales, male    DOB: 03-Apr-1946, 68 y.o.   MRN: [id number]  Cough This is a new problem. The current episode started in the past 7 days. Associated symptoms include headaches, nasal congestion, rhinorrhea and wheezing. Pertinent negatives include no chest pain, ear pain or fever. Treatments tried: loratadine, tylenol, ibuprofen.   Patient no longer smokes. He does relate getting short of breath with activity this is constant for him related to his COPD he also relates a lot of chest congestion head congestion and coughing over the past few days   Review of Systems  Constitutional: Negative for fever and activity change.  HENT: Positive for congestion and rhinorrhea. Negative for ear pain.   Eyes: Negative for discharge.  Respiratory: Positive for cough and wheezing.   Cardiovascular: Negative for chest pain.  Neurological: Positive for headaches.       Objective:   Physical Exam  Constitutional: He appears well-developed.  HENT:  Head: Normocephalic.  Mouth/Throat: Oropharynx is clear and moist. No oropharyngeal exudate.  Neck: Normal range of motion.  Cardiovascular: Normal rate, regular rhythm and normal heart sounds.   No murmur heard. Pulmonary/Chest: Effort normal and breath sounds normal. He has no wheezes.  Lymphadenopathy:    He has no cervical adenopathy.  Neurological: He exhibits normal muscle tone.  Skin: Skin is warm and dry.  Nursing note and vitals reviewed.         Assessment & Plan:  Patient has history of COPD I believe this is causing a mild exacerbation antibiotics are warranted and prescribed  Acute rhinosinusitis as well as. Should get better

## 2014-10-17 ENCOUNTER — Encounter (HOSPITAL_COMMUNITY)
Admission: RE | Admit: 2014-10-17 | Discharge: 2014-10-17 | Disposition: A | Discharge status: Home / Self Care | Payer: Medicare Other | Source: Ambulatory Visit | Attending: Internal Medicine | Admitting: Internal Medicine

## 2014-10-17 DIAGNOSIS — Z5189 Encounter for other specified aftercare: Secondary | ICD-10-CM | POA: Diagnosis not present

## 2014-10-22 ENCOUNTER — Encounter (HOSPITAL_COMMUNITY)
Admission: RE | Admit: 2014-10-22 | Discharge: 2014-10-22 | Disposition: A | Discharge status: Home / Self Care | Payer: Medicare Other | Source: Ambulatory Visit | Attending: Internal Medicine | Admitting: Internal Medicine

## 2014-10-22 DIAGNOSIS — Z5189 Encounter for other specified aftercare: Secondary | ICD-10-CM | POA: Diagnosis not present

## 2014-10-24 ENCOUNTER — Encounter (HOSPITAL_COMMUNITY)
Admission: RE | Admit: 2014-10-24 | Discharge: 2014-10-24 | Disposition: A | Discharge status: Home / Self Care | Payer: Medicare Other | Source: Ambulatory Visit | Attending: Internal Medicine | Admitting: Internal Medicine

## 2014-10-24 DIAGNOSIS — Z5189 Encounter for other specified aftercare: Secondary | ICD-10-CM | POA: Diagnosis not present

## 2014-10-29 ENCOUNTER — Encounter (HOSPITAL_COMMUNITY): Payer: Medicare Other

## 2014-10-29 ENCOUNTER — Encounter (HOSPITAL_COMMUNITY)
Admission: RE | Admit: 2014-10-29 | Discharge: 2014-10-29 | Disposition: A | Discharge status: Home / Self Care | Payer: Medicare Other | Source: Ambulatory Visit | Attending: Internal Medicine | Admitting: Internal Medicine

## 2014-10-29 DIAGNOSIS — Z5189 Encounter for other specified aftercare: Secondary | ICD-10-CM | POA: Diagnosis not present

## 2014-10-31 ENCOUNTER — Encounter (HOSPITAL_COMMUNITY)
Admission: RE | Admit: 2014-10-31 | Discharge: 2014-10-31 | Disposition: A | Discharge status: Home / Self Care | Payer: Medicare Other | Source: Ambulatory Visit | Attending: Internal Medicine | Admitting: Internal Medicine

## 2014-10-31 DIAGNOSIS — Z5189 Encounter for other specified aftercare: Secondary | ICD-10-CM | POA: Diagnosis not present

## 2014-11-05 ENCOUNTER — Encounter (HOSPITAL_COMMUNITY): Payer: Medicare Other

## 2014-11-06 ENCOUNTER — Encounter (HOSPITAL_COMMUNITY)
Admission: RE | Admit: 2014-11-06 | Discharge: 2014-11-06 | Disposition: A | Discharge status: Home / Self Care | Payer: Medicare Other | Source: Ambulatory Visit | Attending: Internal Medicine | Admitting: Internal Medicine

## 2014-11-06 DIAGNOSIS — Z5189 Encounter for other specified aftercare: Secondary | ICD-10-CM | POA: Diagnosis not present

## 2014-11-06 DIAGNOSIS — M25561 Pain in right knee: Secondary | ICD-10-CM | POA: Diagnosis not present

## 2014-11-06 DIAGNOSIS — M1711 Unilateral primary osteoarthritis, right knee: Secondary | ICD-10-CM | POA: Diagnosis not present

## 2014-11-06 NOTE — Progress Notes (Signed)
Patient is discharged from [facility] and Pulmonary program today, November 06, 2014 with 23 sessions.  He achieved LTG of 30 minutes of aerobic exercise at max met level of 3.61.  All patient vitals are WNL.  Patient has met with dietician.  Discharge instructions have been reviewed in detail and patient expressed an understanding of material given.  Patient plans to exercise at home. Cardiac Rehab will make 1 month, 6 month and 1 year call backs.  Patient had no complaints of any abnormal S/S or pain on their exit visit.  Patient finished post 6 min walk test.

## 2014-11-06 NOTE — Progress Notes (Signed)
Pulmonary Rehabilitation Program Outcomes Report   Orientation:  08/16/14 Graduate Date:  11/06/14 Discharge Date:  11/06/14 # of sessions completed: 23  Pulmonologist: ramaswamy Family MD:  Dr. Wolfgang Phoenix Class Time:  1430  A.  Exercise Program:  Tolerates exercise @ 3.61 METS for 15 minutes and Walk Test Results:  Post: 3.18  B.  Mental Health:  Good mental attitude  C.  Education/Instruction/Skills  Accurately checks own pulse.  Rest:  96  Exercise:  112, Knows THR for exercise and Attended 10 education classes  Uses Perceived Exertion Scale and/or Dyspnea Scale  D.  Nutrition/Weight Control/Body Composition:  Adherence to prescribed nutrition program: good    E.  Blood Lipids    Lab Results  Component Value Date   CHOL 153 04/12/2014   HDL 36* 04/12/2014   LDLCALC 102* 04/12/2014   TRIG 73 04/12/2014   CHOLHDL 4.3 04/12/2014    F.  Lifestyle Changes:  Making positive lifestyle changes  G.  Symptoms noted with exercise:  Asymptomatic  Report Completed By:  Stevphen Rochester RN    Comments:  Miguel Morales has graduated Pulmonary Rehab with 23 sessions. Pt stated he feels the program has helped him due to not feeling as "winded" as much and can get more done. Pt stated he feels his legs has gotten stronger. Maintenance program encouraged.

## 2014-11-07 ENCOUNTER — Encounter (HOSPITAL_COMMUNITY): Payer: Medicare Other

## 2014-11-08 ENCOUNTER — Encounter (HOSPITAL_COMMUNITY): Payer: Medicare Other

## 2014-11-08 ENCOUNTER — Other Ambulatory Visit: Payer: Self-pay | Admitting: Family Medicine

## 2014-11-20 ENCOUNTER — Telehealth: Payer: Self-pay | Admitting: Internal Medicine

## 2014-11-20 NOTE — Telephone Encounter (Signed)
Called spoke with pt. He reports he just finished 24 week session of pulm rehab at [facility]. She reports MR was suppose to receive the report. He is wanting to know what the next step is since he is still coughing right much. Please advise MR thanks  --pt aware MR not in office until tomorrow

## 2014-11-20 NOTE — Telephone Encounter (Signed)
The rehab was for dyspnea and not cough. Rehab should have helped dyspnea.If he wants copy of his session reports, he should talk to [facility] rehab and the procedure to get it

## 2014-11-20 NOTE — Telephone Encounter (Signed)
This was from pulm rehab @ [email or web address].Miguel Morales

## 2014-11-21 ENCOUNTER — Telehealth: Payer: Self-pay | Admitting: Family Medicine

## 2014-11-21 ENCOUNTER — Ambulatory Visit (INDEPENDENT_AMBULATORY_CARE_PROVIDER_SITE_OTHER): Payer: Medicare Other | Admitting: Family Medicine

## 2014-11-21 ENCOUNTER — Encounter: Payer: Self-pay | Admitting: Family Medicine

## 2014-11-21 VITALS — BP 112/88 | Temp 98.6°F | Ht 66.0 in | Wt 208.0 lb

## 2014-11-21 DIAGNOSIS — Z79899 Other long term (current) drug therapy: Secondary | ICD-10-CM

## 2014-11-21 DIAGNOSIS — J019 Acute sinusitis, unspecified: Secondary | ICD-10-CM

## 2014-11-21 DIAGNOSIS — B9689 Other specified bacterial agents as the cause of diseases classified elsewhere: Secondary | ICD-10-CM

## 2014-11-21 DIAGNOSIS — R739 Hyperglycemia, unspecified: Secondary | ICD-10-CM

## 2014-11-21 DIAGNOSIS — C61 Malignant neoplasm of prostate: Secondary | ICD-10-CM

## 2014-11-21 DIAGNOSIS — E785 Hyperlipidemia, unspecified: Secondary | ICD-10-CM

## 2014-11-21 MED ORDER — HYDROCODONE-HOMATROPINE 5-1.5 MG/5ML PO SYRP: ORAL_SOLUTION | ORAL | Status: DC

## 2014-11-21 NOTE — Addendum Note (Signed)
Addended by: Sallee Lange A on: 11/21/2014 02:13 PM   Modules accepted: Orders

## 2014-11-21 NOTE — Telephone Encounter (Signed)
Pt having phy done would like labs to be ran before hand   Last labs 8/24     A1C, Lipid                 12/18/13 PSA      04/12/14 Hep, Lip, BMP, A1C

## 2014-11-21 NOTE — Progress Notes (Signed)
   Subjective:    Patient ID: Miguel Morales, male    DOB: Sep 07, 1946, 69 y.o.   MRN: [id number]  Cough This is a new problem. Episode onset: Friday. The cough is productive of sputum. Associated symptoms include a fever, myalgias, nasal congestion, postnasal drip, rhinorrhea, a sore throat and wheezing. Pertinent negatives include no chest pain or ear pain. Associated symptoms comments: Vomited x 1 this AM from drainage. Diarrhea x 1 day, gone now. . Nothing aggravates the symptoms. Risk factors for lung disease include travel. Treatments tried: hycodan, 1 amoxicillin, ibu. The treatment provided mild relief.      Review of Systems  Constitutional: Positive for fever. Negative for activity change.  HENT: Positive for congestion, postnasal drip, rhinorrhea and sore throat. Negative for ear pain.   Eyes: Negative for discharge.  Respiratory: Positive for cough and wheezing.   Cardiovascular: Negative for chest pain.  Musculoskeletal: Positive for myalgias.       Objective:   Physical Exam  Constitutional: He appears well-developed.  HENT:  Head: Normocephalic.  Mouth/Throat: Oropharynx is clear and moist. No oropharyngeal exudate.  Neck: Normal range of motion.  Cardiovascular: Normal rate, regular rhythm and normal heart sounds.   No murmur heard. Pulmonary/Chest: Effort normal and breath sounds normal. He has no wheezes.  Lymphadenopathy:    He has no cervical adenopathy.  Neurological: He exhibits normal muscle tone.  Skin: Skin is warm and dry.  Nursing note and vitals reviewed.         Assessment & Plan:  Sinusitis-antibiotics prescribed. Cefzil 500 twice a day 10 days was called into the pharmacy  Warning signs discussed follow-up if problems  Patient applying for disability through the veterans. He has had significant problems with PTSD as well as eczema and COPD. He will follow-up with pulmonologist regarding possibility of improving the disability rating

## 2014-11-21 NOTE — Telephone Encounter (Signed)
Pt was seen in the office today and forgot to ask the dr for a Cough syrup. Pt is wanting to know if he can get a script for  It.

## 2014-11-21 NOTE — Telephone Encounter (Signed)
Lipid, liver, metabolic PSA, CBC, your micro-protein

## 2014-11-21 NOTE — Telephone Encounter (Signed)
Script printed by Dr. Nicki Reaper. Patient notified that script is ready for pick up.

## 2014-11-21 NOTE — Telephone Encounter (Signed)
Pt states that he still has a constant cough - dry cough  Pt reports having a sinus infection currently -- could be contributing to cough. Pt states that he is out of his Hycodan Syrup.  Uses Combivent and Spiriva as directed.  Pt is also requesting that a letter be written to the [place] regarding his health to request disability. Pt states that he was told by [employer] that he does not need any forms filled out and that a letter from the MD would suffice.   Please advise on the above MR. Thanks.

## 2014-11-22 NOTE — Telephone Encounter (Signed)
Blood work orders placed in Epic. Patient notified. 

## 2014-11-23 NOTE — Telephone Encounter (Signed)
MR please advise. Thanks! 

## 2014-11-26 DIAGNOSIS — M1711 Unilateral primary osteoarthritis, right knee: Secondary | ICD-10-CM | POA: Diagnosis not present

## 2014-11-26 DIAGNOSIS — R739 Hyperglycemia, unspecified: Secondary | ICD-10-CM | POA: Diagnosis not present

## 2014-11-26 DIAGNOSIS — Z79899 Other long term (current) drug therapy: Secondary | ICD-10-CM | POA: Diagnosis not present

## 2014-11-26 DIAGNOSIS — C61 Malignant neoplasm of prostate: Secondary | ICD-10-CM | POA: Diagnosis not present

## 2014-11-26 DIAGNOSIS — M25561 Pain in right knee: Secondary | ICD-10-CM | POA: Diagnosis not present

## 2014-11-26 DIAGNOSIS — E785 Hyperlipidemia, unspecified: Secondary | ICD-10-CM | POA: Diagnosis not present

## 2014-11-27 LAB — CBC WITH DIFFERENTIAL/PLATELET
BASOS ABS: 0 10*3/uL (ref 0.0–0.1)
Basophils Relative: 0 % (ref 0–1)
Eosinophils Absolute: 0.3 10*3/uL (ref 0.0–0.7)
Eosinophils Relative: 4 % (ref 0–5)
HCT: 40.1 % (ref 39.0–52.0)
Hemoglobin: 13.6 g/dL (ref 13.0–17.0)
LYMPHS PCT: 23 % (ref 12–46)
Lymphs Abs: 1.5 10*3/uL (ref 0.7–4.0)
MCH: 31.2 pg (ref 26.0–34.0)
MCHC: 33.9 g/dL (ref 30.0–36.0)
MCV: 92 fL (ref 78.0–100.0)
MONOS PCT: 6 % (ref 3–12)
MPV: 10.3 fL (ref 8.6–12.4)
Monocytes Absolute: 0.4 10*3/uL (ref 0.1–1.0)
NEUTROS PCT: 67 % (ref 43–77)
Neutro Abs: 4.4 10*3/uL (ref 1.7–7.7)
PLATELETS: 279 10*3/uL (ref 150–400)
RBC: 4.36 MIL/uL (ref 4.22–5.81)
RDW: 13.8 % (ref 11.5–15.5)
WBC: 6.5 10*3/uL (ref 4.0–10.5)

## 2014-11-27 LAB — HEPATIC FUNCTION PANEL
ALBUMIN: 3.9 g/dL (ref 3.5–5.2)
ALT: 22 U/L (ref 0–53)
AST: 20 U/L (ref 0–37)
Alkaline Phosphatase: 86 U/L (ref 39–117)
BILIRUBIN TOTAL: 0.7 mg/dL (ref 0.2–1.2)
Bilirubin, Direct: 0.2 mg/dL (ref 0.0–0.3)
Indirect Bilirubin: 0.5 mg/dL (ref 0.2–1.2)
TOTAL PROTEIN: 6.5 g/dL (ref 6.0–8.3)

## 2014-11-27 LAB — LIPID PANEL
CHOL/HDL RATIO: 3.9 ratio
CHOLESTEROL: 145 mg/dL (ref 0–200)
HDL: 37 mg/dL — AB (ref >39)
LDL Cholesterol: 91 mg/dL (ref 0–99)
Triglycerides: 86 mg/dL (ref <150)
VLDL: 17 mg/dL (ref 0–40)

## 2014-11-27 LAB — BASIC METABOLIC PANEL
BUN: 14 mg/dL (ref 6–23)
CALCIUM: 8.9 mg/dL (ref 8.4–10.5)
CO2: 27 mEq/L (ref 19–32)
Chloride: 105 mEq/L (ref 96–112)
Creat: 0.96 mg/dL (ref 0.50–1.35)
Glucose, Bld: 101 mg/dL — ABNORMAL HIGH (ref 70–99)
POTASSIUM: 3.7 meq/L (ref 3.5–5.3)
Sodium: 141 mEq/L (ref 135–145)

## 2014-11-27 LAB — MICROALBUMIN, URINE: Microalb, Ur: 1.2 mg/dL (ref <2.0)

## 2014-11-27 LAB — PSA, MEDICARE: PSA: 1.08 ng/mL (ref <=4.00)

## 2014-11-27 NOTE — Telephone Encounter (Signed)
MR please advise, thanks!  

## 2014-11-28 ENCOUNTER — Ambulatory Visit (INDEPENDENT_AMBULATORY_CARE_PROVIDER_SITE_OTHER): Payer: Medicare Other | Admitting: *Deleted

## 2014-11-28 DIAGNOSIS — R739 Hyperglycemia, unspecified: Secondary | ICD-10-CM

## 2014-11-28 LAB — POCT GLYCOSYLATED HEMOGLOBIN (HGB A1C): Hemoglobin A1C: 6

## 2014-11-29 ENCOUNTER — Other Ambulatory Visit: Payer: Self-pay | Admitting: Family Medicine

## 2014-12-01 ENCOUNTER — Other Ambulatory Visit: Payer: Self-pay | Admitting: Family Medicine

## 2014-12-03 NOTE — Telephone Encounter (Signed)
PCP already treated him for sinusistis for 10 days from 11/21/14. SHould have ended 12/02/14. Please check how he is doing with cough  Regarding disability papers" best he does face to face with me so we can use the time to do the letter truthfully. SO, a visit just to [name]

## 2014-12-03 NOTE — Telephone Encounter (Signed)
Pt returned call [phone] or [phone]

## 2014-12-03 NOTE — Telephone Encounter (Signed)
lmtcb X1 

## 2014-12-03 NOTE — Telephone Encounter (Signed)
Spoke with pt, states his cough is stable.  Scheduled for tomorrow at 12 with MR to review disability paperwork.  Nothing further needed.

## 2014-12-04 ENCOUNTER — Ambulatory Visit (INDEPENDENT_AMBULATORY_CARE_PROVIDER_SITE_OTHER): Payer: Medicare Other | Admitting: Internal Medicine

## 2014-12-04 ENCOUNTER — Encounter: Payer: Self-pay | Admitting: Internal Medicine

## 2014-12-04 VITALS — BP 148/80 | HR 96 | Ht 66.0 in | Wt 202.0 lb

## 2014-12-04 DIAGNOSIS — R911 Solitary pulmonary nodule: Secondary | ICD-10-CM

## 2014-12-04 DIAGNOSIS — R0602 Shortness of breath: Secondary | ICD-10-CM

## 2014-12-04 NOTE — Progress Notes (Signed)
Subjective:    Patient ID: Miguel Morales, male    DOB: 12/07/1945, 69 y.o.   MRN: [id number]  HPI  IOV 01/11/2014 Chief Complaint  Patient presents with  . Pulmonary Consult    pt states he was diagnosed with COPD back in 2011 by Dr. Wolfgang Phoenix. He states his breathing has not been any better since then so wants to f/u with a speacial    69 year old male with a 30 pack smoking history. 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 He is here for dyspnea evaluation. He's also plan for disability through the [place].  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 At that time he was in his 66s and he was still smoking. Subsequently after return to the [place] sometime in the mid 90s he developed insidious onset of shortness of breath with exertion and relieved by rest. Since then dyspnea is been slowly progressive despite quitting smoking around 2004. In 2011 he had a pulmonary function test at [facility] for which I do not have the full results but apparently was told that he had COPD. In the last 5-10 years these have progressively more dyspnea and exertion. Currently is on Spiriva for maintenance along with Combivent twice a day for maintenance [note he takes 2 anticholinergics for maintenance]. He notices dyspnea for climbing 1 flight of stairs or doing yard work or using a chainsaw or lifting boxes 10# boxes  food at [employer]. The last 5-10 years is also gained 10 pounds of weight. He says that several years ago he could do these tasks without a problem he did is now sustained cough or chest pain or edema.   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 COPD + May 13: Normal nuclear medicine Myoview of the heart June 2013: Stress Myoview EKG is normal per report   reports that he quit smoking about 10 years ago. His smoking use included Cigarettes. He has a 30 pack-year smoking history. He has never used smokeless tobacco. >>set up pft    01/18/2014 Follow up w/ PFT  Patient returns for a followup visit to review to pulmonary  function test results. PFTs (s/p BD) showed an FEV1 2.45 L, 88%, ratio of 72;  FVC 90%, no significant bronchodilator response. Mid-flows diminished at 67%, with 28% , change, status post bronchodilator.  Diffusing capacity, diminished at 68% No signifcant change in symptoms. Has  SOB with mild activity, and dry cough x years. Wears out easily, gives out of stamina.  Denies any chest pain, hemoptysis, orthopnea, PND, or leg swelling Does have quite a bit of postnasal drip, and drainage  REC May use  Claritin 10mg  At bedtime  As needed  Drainage  May use Veramyst nasal for nasal congesiton  I will call regarding CT chest  follow up Dr. Chase Caller in 6 weeks and As needed     Late  add : set up HR CT to look for ILD   OV 02/08/2014  Chief Complaint  Patient presents with  . Follow-up    Review CT chest done on tuesday.  Pt c/o SOB with exertion.  Denies chest congestion, tightness, wheezing.     Followup dyspnea workup in previous smoker and ex [place] War veteran.  PFT  test shows isolated reduction in diffusion capacity. High resolution CT scan of the chest done recently has no official report because the thoracic radiologist is on vacation. However to me it appears that there might be some supple bibasal right greater than left early reticular pattern of interstitial lung disease without  any specific honeycombing. 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 Follow-up    review cpst and ct chest.  c/o SOB with exertion.     Follow up dyspnea. Here to review results of CT scan of the chest and cardiopulmonary stress test. No new complaints. He presents with his wife Boe Deans for the first time and she is interested in knowing the results of workup  CT chest 02/06/14 IMPRESSION:  1. No evidence of interstitial lung disease.  2. 10 x 13 mm left upper lobe ground-glass nodule. Initial follow-up  by chest CT without contrast is recommended in 3 months to confirm  persistence. This recommendation follows the consensus statement:  Recommendations for the Management of Subsolid Pulmonary Nodules  Detected at CT: A Statement from the [employer] as published  in Radiology 2013; 266:304-317.  3. 4 mm right lower lobe nodule. Given risk factors for bronchogenic  carcinoma, follow-up chest CT at 1 year is recommended. This  recommendation follows the consensus statement: Guidelines for  Management of Small Pulmonary Nodules Detected on CT Scans: A  Statement from the Fleischner Society as published in Radiology  2005; 237:395-400.  4. Three-vessel coronary artery calcification.  Electronically Signed  By: Lorin Picket M.D.  On: 02/09/2014 11:29    CPST 02/14/14 Interpretation  Notes: Patient gave an excellent effort. The pulse-oximetry remained at 98% throughout the exercise. Exercise was performed on a cycle ergometer starting at [facility] and increasing by 12 W/min.  ECG: Resting ECG in normal rhythm. There was an adequate HR response to the exercise with no ST-T changes or sustained arrhythmias (rare, isolated  PVCs were noted, with one observed at peak exercise). There was an increase in ventricular ectopy during the 3rd minute of recovery. The BP response was appropriate.  PFT: Resting spirometry within normal limits, but a restrictive pattern was observed in a reduced ERV. The MVV was normal. Post-exercise spirometry was performed IPE and 5, 10, 15 and 20 (reported above) minutes of recovery with no changes to suggestive of EIB.  CPX: The RER of 1.16 indicates a maximal effort. The peak VO2 is normal at 20.6 ml/kg/min (98% of the age/gender/weight matched sedentary norm). When adjusted to the patients ideal body weight of 158 lb (71.7 kg) the peak VO2 is 26.2 ml/kg (ibw)/min (105% of the IBW adjusted predicted). The VO2 a the ventilatory threshold is normal at 62% of the predicted peak VO2. At peak exercise the ventilation was 72% of the measured MVV indicating ventilatory limits were being approached (respiratory rate was within expected  limits, Vt/IC [65%] was within expected limits, PETCO2 was below normal). As mentioned above there was an adequate HR response to the exercise with a HR exceeding age-predicted maximum by 2 bpm. The O2pulse (a surrogate for stroke volume) increased throughout the exercise reaching 13 ml/beat (100% of predicted). The VE/VCO2 slope is moderately elevated and suggests increased dead space ventilation and reduced ventilatory efficiency. The oxygen uptake efficiency slope (OUES) is normal and reflects the patient's measured functional capacity.  Conclusion: Exercise testing with gas exchange demonstrates a normal functional capacity when compared to matched sedentary norms. These data suggest deconditioning as the primary cause of the patient's perceived exercise intolerance. There is a borderline ventilatory limitation to the exercise, with some hyperventilation at peak exercise.     Report prepared by: Linzie Collin, PhD, ACSM-RCEP Sr. Exercise  Physiologist 02/20/2014 4:33 PM   #Shortness of breath - No evidence of ILD per radiologist but if is there, will show up on future CT chest  -Pulmonary stress test suggests that this is due to you slightly being overweight and due to physical deconditioning  - As discussed you will start an exercise program with the help of your friends at the [facility]; if this does not work then I will refer you to pulmonary rehab at [facility]  #Lung nodule 4mm Left Upper Lobe Ground Glass and 105mm Right Right Lower lobe - seen April 2015 and NEW problem  -= do CT chest early July 2015; 3rd month CT chest  #Followup - early July 2015 after ct chest    OV 07/03/2014  Chief Complaint  Patient presents with  . Follow-up    Pt here to review PET. Pt staes his breathing is unchanged. Pt c/o DOE, cough with intermittent mucous production, clear in color. Pt c/o mild and rare chest tightness with over exertion   FU   - dyspnea and LUL nodule   - dyspnea: CPST showed duye to obesity and deconditioning. I advised pulm rehab but he opted to work out at [employer]. Instead he is only sporadically working out at home. Therefore, dyspnea not better. I counseled and he is willing to attend rehab at [facility]  -  Lung nodule LUL 76mm and 12mm RLLL  -both new April 2015. We worked this up further with PET scan 05/11/10/5; indeterminate low prob. And with INDI Dx blood tests indiDx 05/31/14 puts probability of lung nodule being benign at 90%.     Past hx: Seeing Dr Erik Obey for fullness in left tonsil and otalgia found to have 29mm cystic lesion. S/p bx 03/19/14 and had atypical cells. THis is not seen in PET. Aparently he has been reassured this is benigng    OV 10/09/2014  Chief Complaint  Patient presents with  . Follow-up    Pt here to review CT scan. Pt states since last OV his breathing is unchanged. Pt c/o dry cough and chest tightness with exertion.     FU - dyspnea and LUL nodule   - dyspnea: CPST showed duye  to obesity and deconditioning. Stress test iun 2013 was normal.   - still dealing with dyspnea. Not better.    -  Lung nodule LUL 43mm and 9mm RLLL  -both new April 2015.   -  PET scan 05/11/10/5; indeterminate low prob.   - INDI Dx blood tests indiDx 05/31/14 puts probability of lung nodule being benign at 90% (new test with some validations  - HEr eto review CT chest today   -  CT cone 10/02/14: 1. No acute findings to account for the patient's symptoms. 2. No change in 12 x 15 mm ground-glass attenuation nodule in the left upper lobe. This is reassuring, however, continued attention on future followup studies is recommended to ensure stability of this finding. Follow-up chest CT in 1 year is recommended at this time.    OV 12/04/2014  Chief Complaint  Patient presents with  . Follow-up    Pt stated his breathing has improved slightly since last OV. Pt c/o DOE, "coughing spells" with clear mucus. Pt stated he was given abx for a sinus infection 2 weeks ago. Pt denies CP/tightness.    Acute visit today. He continues to have dyspnea and exertion for 800 feet relieved by rest. He is frustrated by symptoms. He feels confident and strongly that this is due to COPD he was only only of normality on his pulmonary function test is isolated reduction in diffusion capacity from March 2015 and cardia promised stress test in April 2015 showed physical deconditioning as cause of dyspnea. He continues his Combivent which does not give him good relief. He is convinced that this is all from COPD. He has applied for the [facility] for disability and based on the form that he showed me the only granted him 10% disability on account of COPD. He wants to apply and get 30% disability from COPD. The form clearly states that he needs have a DLCO less than 65% for this are reductions in his FEV1 to make him eligible     Review of Systems  Constitutional: Negative for fever and unexpected weight change.  HENT:  Negative for congestion, dental problem, ear pain, nosebleeds, postnasal drip, rhinorrhea, sinus pressure, sneezing, sore throat and trouble swallowing.   Eyes: Negative for redness and itching.  Respiratory: Positive for cough and shortness of breath. Negative for chest tightness and wheezing.   Cardiovascular: Negative for palpitations and leg swelling.  Gastrointestinal: Negative for nausea and vomiting.  Genitourinary: Negative for dysuria.  Musculoskeletal: Negative for joint swelling.  Skin: Negative for rash.  Neurological: Negative for headaches.  Hematological: Does not bruise/bleed easily.  Psychiatric/Behavioral: Negative for dysphoric mood. The patient is not nervous/anxious.        Objective:   Physical Exam  Filed Vitals:   12/04/14 1211  BP: 148/80  Pulse: 96  Height: 5\' 6"  (1.676 m)  Weight: 202 lb (91.627 kg)  SpO2: 96%  discussion only visit no exam       Assessment & Plan:     ICD-9-CM ICD-10-CM   1. Shortness of breath 786.05 R06.02 Pulmonary function test  2. Nodule of left lung 793.11 R91.1 Pulmonary function test   Shortness of breath: I spent substantial amount of time explaining to him the differential diagnosis of dyspnea is brought in not all of it is tied to lung disease especially COPD. Explained to him that only of normality is a moderate reduction in diffusion capacity. Explained to him that I do not make the call on disability applications. Explained To him that the [place] has their own criteria and most of it appears to be objective as supposed to subjective. Before this conversation several times before but he seems to persist. At this point in time from a medical management standpoint due to persistent dyspnea will get serial PFT and reassess. If it shows worsening will make changes in inhaler regimen. He can use that PFT to reapply for his disability   Nodule  the lung:  Next CT chest is in September 2016   (> 50% of this 15 min visit spent in  face to face counseling or/and coordination of care)    Dr. Brand Males, M.D., [facility].C.P Pulmonary and Critical Care Medicine Staff Physician [facility] Pager: [phone], If no answer or between  15:00h - 7:00h: call [phone]  12/04/2014 12:47 PM

## 2014-12-04 NOTE — Patient Instructions (Addendum)
#  Shortness of breath - Too bad still persists - No evidence of Interstistial Lung Disease  -Pulmonary stress test suggests that this is due to you slightly being overweight and due to physical deconditioning  - PFT suggests mild emphysema  PLAN - repeat PFT (Serial) to assess progression given persistence of shortness of breath  - Continue weight loss and exercise therapy   #Lung nodule 66mm Left Upper Lobe Ground Glass and 57mm Right Right Lower lobe - seen April 2015 and NEW problem  -  Please do next CT chest wo contrast  September 2016 (should have been ordered earlier(  #Followup - return to see my NP Tammy after PFT test -CT chest  Sep 2016 (previously ordered)

## 2014-12-24 ENCOUNTER — Telehealth: Payer: Self-pay | Admitting: Orthopedic Surgery

## 2014-12-26 NOTE — Telephone Encounter (Signed)
Called patient, no answer 

## 2014-12-26 NOTE — Telephone Encounter (Signed)
Faxed received  Dr Aline Brochure declined

## 2015-01-02 ENCOUNTER — Ambulatory Visit (INDEPENDENT_AMBULATORY_CARE_PROVIDER_SITE_OTHER): Payer: Medicare Other | Admitting: Internal Medicine

## 2015-01-02 ENCOUNTER — Encounter: Payer: Self-pay | Admitting: Adult Health

## 2015-01-02 ENCOUNTER — Ambulatory Visit (INDEPENDENT_AMBULATORY_CARE_PROVIDER_SITE_OTHER): Payer: Medicare Other | Admitting: Adult Health

## 2015-01-02 ENCOUNTER — Encounter: Payer: Medicare Other | Admitting: Family Medicine

## 2015-01-02 VITALS — BP 114/76 | HR 82 | Temp 98.3°F | Ht 66.0 in | Wt 206.8 lb

## 2015-01-02 DIAGNOSIS — R0602 Shortness of breath: Secondary | ICD-10-CM | POA: Diagnosis not present

## 2015-01-02 DIAGNOSIS — R911 Solitary pulmonary nodule: Secondary | ICD-10-CM

## 2015-01-02 LAB — PULMONARY FUNCTION TEST
DL/VA % pred: 78 %
DL/VA: 3.4 ml/min/mmHg/L
DLCO UNC % PRED: 72 %
DLCO unc: 19.57 ml/min/mmHg
FEF 25-75 Post: 1.23 L/sec
FEF 25-75 Pre: 1.12 L/sec
FEF2575-%Change-Post: 10 %
FEF2575-%PRED-POST: 59 %
FEF2575-%Pred-Pre: 53 %
FEV1-%Change-Post: 4 %
FEV1-%PRED-PRE: 71 %
FEV1-%Pred-Post: 74 %
FEV1-POST: 2.04 L
FEV1-Pre: 1.96 L
FEV1FVC-%Change-Post: 5 %
FEV1FVC-%Pred-Pre: 91 %
FEV6-%Change-Post: 0 %
FEV6-%PRED-POST: 82 %
FEV6-%Pred-Pre: 82 %
FEV6-Post: 2.89 L
FEV6-Pre: 2.88 L
FEV6FVC-%Change-Post: 1 %
FEV6FVC-%PRED-POST: 105 %
FEV6FVC-%Pred-Pre: 104 %
FVC-%CHANGE-POST: -1 %
FVC-%Pred-Post: 77 %
FVC-%Pred-Pre: 78 %
FVC-Post: 2.9 L
FVC-Pre: 2.93 L
POST FEV6/FVC RATIO: 100 %
PRE FEV6/FVC RATIO: 98 %
Post FEV1/FVC ratio: 70 %
Pre FEV1/FVC ratio: 67 %
RV % PRED: 129 %
RV: 2.89 L
TLC % pred: 101 %
TLC: 6.31 L

## 2015-01-02 NOTE — Progress Notes (Signed)
Subjective:    Patient ID: Miguel Morales, male    DOB: 10/06/1946, 69 y.o.   MRN: [id number]  HPI  IOV 01/11/2014 Chief Complaint  Patient presents with  . Pulmonary Consult    pt states he was diagnosed with COPD back in 2011 by Dr. Wolfgang Phoenix. He states his breathing has not been any better since then so wants to f/u with a speacial    69 year old male with a 30 pack smoking history. 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 He is here for dyspnea evaluation. He's also plan for disability through the [place].  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 At that time he was in his 61s and he was still smoking. Subsequently after return to the [place] sometime in the mid 90s he developed insidious onset of shortness of breath with exertion and relieved by rest. Since then dyspnea is been slowly progressive despite quitting smoking around 2004. In 2011 he had a pulmonary function test at [facility] for which I do not have the full results but apparently was told that he had COPD. In the last 5-10 years these have progressively more dyspnea and exertion. Currently is on Spiriva for maintenance along with Combivent twice a day for maintenance [note he takes 2 anticholinergics for maintenance]. He notices dyspnea for climbing 1 flight of stairs or doing yard work or using a chainsaw or lifting boxes 10# boxes  food at [employer]. The last 5-10 years is also gained 10 pounds of weight. He says that several years ago he could do these tasks without a problem he did is now sustained cough or chest pain or edema.   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 COPD + May 13: Normal nuclear medicine Myoview of the heart June 2013: Stress Myoview EKG is normal per report   reports that he quit smoking about 10 years ago. His smoking use included Cigarettes. He has a 30 pack-year smoking history. He has never used smokeless tobacco. >>set up pft    01/18/2014 Follow up w/ PFT  Patient returns for a followup visit to review to pulmonary  function test results. PFTs (s/p BD) showed an FEV1 2.45 L, 88%, ratio of 72;  FVC 90%, no significant bronchodilator response. Mid-flows diminished at 67%, with 28% , change, status post bronchodilator.  Diffusing capacity, diminished at 68% No signifcant change in symptoms. Has  SOB with mild activity, and dry cough x years. Wears out easily, gives out of stamina.  Denies any chest pain, hemoptysis, orthopnea, PND, or leg swelling Does have quite a bit of postnasal drip, and drainage  REC May use  Claritin 10mg  At bedtime  As needed  Drainage  May use Veramyst nasal for nasal congesiton  I will call regarding CT chest  follow up Dr. Chase Caller in 6 weeks and As needed     Late  add : set up HR CT to look for ILD   OV 02/08/2014  Chief Complaint  Patient presents with  . Follow-up    Review CT chest done on tuesday.  Pt c/o SOB with exertion.  Denies chest congestion, tightness, wheezing.     Followup dyspnea workup in previous smoker and ex [place] War veteran.  PFT  test shows isolated reduction in diffusion capacity. High resolution CT scan of the chest done recently has no official report because the thoracic radiologist is on vacation. However to me it appears that there might be some supple bibasal right greater than left early reticular pattern of interstitial lung disease without  any specific honeycombing. 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 Follow-up    review cpst and ct chest.  c/o SOB with exertion.     Follow up dyspnea. Here to review results of CT scan of the chest and cardiopulmonary stress test. No new complaints. He presents with his wife Vernell Townley for the first time and she is interested in knowing the results of workup  CT chest 02/06/14 IMPRESSION:  1. No evidence of interstitial lung disease.  2. 10 x 13 mm left upper lobe ground-glass nodule. Initial follow-up  by chest CT without contrast is recommended in 3 months to confirm  persistence. This recommendation follows the consensus statement:  Recommendations for the Management of Subsolid Pulmonary Nodules  Detected at CT: A Statement from the [place] as published  in Radiology 2013; 266:304-317.  3. 4 mm right lower lobe nodule. Given risk factors for bronchogenic  carcinoma, follow-up chest CT at 1 year is recommended. This  recommendation follows the consensus statement: Guidelines for  Management of Small Pulmonary Nodules Detected on CT Scans: A  Statement from the Fleischner Society as published in Radiology  2005; 237:395-400.  4. Three-vessel coronary artery calcification.  Electronically Signed  By: Lorin Picket M.D.  On: 02/09/2014 11:29    CPST 02/14/14 Interpretation  Notes: Patient gave an excellent effort. The pulse-oximetry remained at 98% throughout the exercise. Exercise was performed on a cycle ergometer starting at [facility] and increasing by 12 W/min.  ECG: Resting ECG in normal rhythm. There was an adequate HR response to the exercise with no ST-T changes or sustained arrhythmias (rare, isolated  PVCs were noted, with one observed at peak exercise). There was an increase in ventricular ectopy during the 3rd minute of recovery. The BP response was appropriate.  PFT: Resting spirometry within normal limits, but a restrictive pattern was observed in a reduced ERV. The MVV was normal. Post-exercise spirometry was performed IPE and 5, 10, 15 and 20 (reported above) minutes of recovery with no changes to suggestive of EIB.  CPX: The RER of 1.16 indicates a maximal effort. The peak VO2 is normal at 20.6 ml/kg/min (98% of the age/gender/weight matched sedentary norm). When adjusted to the patients ideal body weight of 158 lb (71.7 kg) the peak VO2 is 26.2 ml/kg (ibw)/min (105% of the IBW adjusted predicted). The VO2 a the ventilatory threshold is normal at 62% of the predicted peak VO2. At peak exercise the ventilation was 72% of the measured MVV indicating ventilatory limits were being approached (respiratory rate was within expected  limits, Vt/IC [65%] was within expected limits, PETCO2 was below normal). As mentioned above there was an adequate HR response to the exercise with a HR exceeding age-predicted maximum by 2 bpm. The O2pulse (a surrogate for stroke volume) increased throughout the exercise reaching 13 ml/beat (100% of predicted). The VE/VCO2 slope is moderately elevated and suggests increased dead space ventilation and reduced ventilatory efficiency. The oxygen uptake efficiency slope (OUES) is normal and reflects the patient's measured functional capacity.  Conclusion: Exercise testing with gas exchange demonstrates a normal functional capacity when compared to matched sedentary norms. These data suggest deconditioning as the primary cause of the patient's perceived exercise intolerance. There is a borderline ventilatory limitation to the exercise, with some hyperventilation at peak exercise.     Report prepared by: Linzie Collin, PhD, ACSM-RCEP Sr. Exercise  Physiologist 02/20/2014 4:33 PM   #Shortness of breath - No evidence of ILD per radiologist but if is there, will show up on future CT chest  -Pulmonary stress test suggests that this is due to you slightly being overweight and due to physical deconditioning  - As discussed you will start an exercise program with the help of your friends at the [facility]; if this does not work then I will refer you to pulmonary rehab at [facility]  #Lung nodule 7mm Left Upper Lobe Ground Glass and 54mm Right Right Lower lobe - seen April 2015 and NEW problem  -= do CT chest early July 2015; 3rd month CT chest  #Followup - early July 2015 after ct chest    OV 07/03/2014  Chief Complaint  Patient presents with  . Follow-up    Pt here to review PET. Pt staes his breathing is unchanged. Pt c/o DOE, cough with intermittent mucous production, clear in color. Pt c/o mild and rare chest tightness with over exertion   FU   - dyspnea and LUL nodule   - dyspnea: CPST showed duye to obesity and deconditioning. I advised pulm rehab but he opted to work out at [employer]. Instead he is only sporadically working out at home. Therefore, dyspnea not better. I counseled and he is willing to attend rehab at [facility]  -  Lung nodule LUL 36mm and 64mm RLLL  -both new April 2015. We worked this up further with PET scan 05/11/10/5; indeterminate low prob. And with INDI Dx blood tests indiDx 05/31/14 puts probability of lung nodule being benign at 90%.     Past hx: Seeing Dr Erik Obey for fullness in left tonsil and otalgia found to have 95mm cystic lesion. S/p bx 03/19/14 and had atypical cells. THis is not seen in PET. Aparently he has been reassured this is benigng    OV 10/09/2014  Chief Complaint  Patient presents with  . Follow-up    Pt here to review CT scan. Pt states since last OV his breathing is unchanged. Pt c/o dry cough and chest tightness with exertion.     FU - dyspnea and LUL nodule   - dyspnea: CPST showed duye  to obesity and deconditioning. Stress test iun 2013 was normal.   - still dealing with dyspnea. Not better.    -  Lung nodule LUL 70mm and 46mm RLLL  -both new April 2015.   -  PET scan 05/11/10/5; indeterminate low prob.   - INDI Dx blood tests indiDx 05/31/14 puts probability of lung nodule being benign at 90% (new test with some validations  - HEr eto review CT chest today   -  CT cone 10/02/14: 1. No acute findings to account for the patient's symptoms. 2. No change in 12 x 15 mm ground-glass attenuation nodule in the left upper lobe. This is reassuring, however, continued attention on future followup studies is recommended to ensure stability of this finding. Follow-up chest CT in 1 year is recommended at this time.    OV 12/04/2014  Chief Complaint  Patient presents with  . Follow-up    Pt stated his breathing has improved slightly since last OV. Pt c/o DOE, "coughing spells" with clear mucus. Pt stated he was given abx for a sinus infection 2 weeks ago. Pt denies CP/tightness.    Acute visit today. He continues to have dyspnea and exertion for 800 feet relieved by rest. He is frustrated by symptoms. He feels confident and strongly that this is due to COPD he was only only of normality on his pulmonary function test is isolated reduction in diffusion capacity from March 2015 and cardia promised stress test in April 2015 showed physical deconditioning as cause of dyspnea. He continues his Combivent which does not give him good relief. He is convinced that this is all from COPD. He has applied for the [facility] for disability and based on the form that he showed me the only granted him 10% disability on account of COPD. He wants to apply and get 30% disability from COPD. The form clearly states that he needs have a DLCO less than 65% for this are reductions in his FEV1 to make him eligible   01/02/2015 Follow up : PFT  Patient returns today for a one-month follow-up, and pulmonary function  test results Patient had PFT done today. (S/p BD )FEV1 at 74%, ratio 70, FVC 77%, no significant bronchodilator response, diffusing capacity at 72%, In 01/2014 PFT showed: PFTs (s/p BD) showed an FEV1 2.45 L, 88%, ratio of 72;  FVC 90%, no significant bronchodilator response. Mid-flows diminished at 67%, with 28% , change, status post bronchodilator.  Diffusing capacity, diminished at 68% Patient remains on Spiriva. We discussed there has been a slight drop over the last year. He is having no active symptoms of cough, wheezing. He is very active at home community and church. He seems to wear out when he is lifting heavy materials. We talked about frequent rest breaks. He denied any chest pain, orthopnea, PND, increased leg swelling Had cardiac stress test in 2013 that was negative for ischemia Previous CPST suggested symptoms related to obesity and deconditioning. Patient advised about weight and exercise Patient does have a known lung nodule that we have been following by CAT scan and this is due for a repeat in September 2016    Review of Systems   Constitutional:   No  weight loss, night sweats,  Fevers, chills,  +fatigue, or  lassitude.  HEENT:   No headaches,  Difficulty swallowing,  Tooth/dental problems, or  Sore throat,                No sneezing, itching, ear ache, nasal congestion, post nasal drip,   CV:  No chest pain,  Orthopnea, PND, swelling in lower extremities, anasarca, dizziness, palpitations, syncope.   GI  No heartburn, indigestion, abdominal pain, nausea, vomiting, diarrhea, change in bowel habits, loss of appetite, bloody stools.   Resp: No.  No excess mucus, no productive cough,  No non-productive cough,  No coughing up of blood.  No change in color of mucus.  No wheezing.  No chest wall deformity  Skin: no rash or lesions.  GU: no dysuria, change in color of urine, no urgency or frequency.  No flank pain, no hematuria   MS:  No joint pain or swelling.  No  decreased range of motion.  No back pain.  Psych:  No change in mood or affect. No depression or anxiety.  No memory loss.        Objective:   Physical Exam GEN: A/Ox3; pleasant , NAD, well nourished   HEENT:  [place]/AT,  EACs-clear, TMs-wnl, NOSE-clear, THROAT-clear, no lesions, no postnasal drip or exudate noted.   NECK:  Supple w/ fair ROM; no JVD; normal carotid impulses w/o bruits; no thyromegaly or nodules palpated; no lymphadenopathy.  RESP  Clear  P & A; w/o, wheezes/ rales/ or rhonchi.no accessory muscle use, no dullness to percussion  CARD:  RRR, no m/r/g  , no peripheral edema, pulses intact, no cyanosis or clubbing.  GI:   Soft & nt; nml bowel sounds; no organomegaly or masses detected.  Musco: Warm bil, no deformities or joint swelling noted.   Neuro: alert, no focal deficits noted.    Skin: Warm, no lesions or rashes       Assessment & Plan:

## 2015-01-02 NOTE — Patient Instructions (Signed)
Follow up Lung nodule in Sept 2016 .  Continue on Spiriva daily  follow up Dr. Chase Caller in 4 months and As needed

## 2015-01-02 NOTE — Assessment & Plan Note (Signed)
Dyspnea with extensive workup She is a former smoker -no significant airflow obstruction on PFTs He has had a slight drop in his lung capacity over the last year. However, he is asymptomatic with cough or wheezing He remains on Spiriva and is active. CPST was consistent with obesity and deconditioning CT chest showed no evidence of interstitial lung disease  Advise patient to continue with his exercise and activity as tolerated Cont on Spiriva daily Follow-up in 4 months

## 2015-01-02 NOTE — Assessment & Plan Note (Signed)
Plan for CT chest in Sept 2016

## 2015-01-02 NOTE — Progress Notes (Signed)
PFT done today. 

## 2015-01-09 ENCOUNTER — Other Ambulatory Visit: Payer: Self-pay | Admitting: Family Medicine

## 2015-01-10 ENCOUNTER — Encounter: Payer: Self-pay | Admitting: Family Medicine

## 2015-01-10 ENCOUNTER — Ambulatory Visit (INDEPENDENT_AMBULATORY_CARE_PROVIDER_SITE_OTHER): Payer: Medicare Other | Admitting: Family Medicine

## 2015-01-10 VITALS — BP 116/72 | Ht 65.5 in | Wt 201.5 lb

## 2015-01-10 DIAGNOSIS — Z0000 Encounter for general adult medical examination without abnormal findings: Secondary | ICD-10-CM | POA: Diagnosis not present

## 2015-01-10 NOTE — Progress Notes (Signed)
   Subjective:    Patient ID: Miguel Morales, male    DOB: 19-Oct-1946, 69 y.o.   MRN: [id number]  HPI AWV- Annual Wellness Visit  The patient was seen for their annual wellness visit. The patient's past medical history, surgical history, and family history were reviewed. Pertinent vaccines were reviewed ( tetanus, pneumonia, shingles, flu) The patient's medication list was reviewed and updated.  The height and weight were entered. The patient's current BMI is: 33.02  Cognitive screening was completed. Outcome of Mini - Cog: passed  Falls within the past 6 months: none  Current tobacco usage: non-smoker (All patients who use tobacco were given written and verbal information on quitting)  Recent listing of emergency department/hospitalizations over the past year were reviewed.  current specialist the patient sees on a regular basis: Dr. Ellene Route and a dermatologist at the [facility] annual wellness visit patient questionnaire was reviewed.  A written screening schedule for the patient for the next 5-10 years was given. Appropriate discussion of followup regarding next visit was discussed.  Patient states that he has no concerns at this time.      Review of Systems  Constitutional: Negative for fever, activity change and appetite change.  HENT: Negative for congestion and rhinorrhea.   Eyes: Negative for discharge.  Respiratory: Negative for cough and wheezing.   Cardiovascular: Negative for chest pain.  Gastrointestinal: Negative for vomiting, abdominal pain and blood in stool.  Genitourinary: Negative for frequency and difficulty urinating.  Musculoskeletal: Negative for neck pain.  Skin: Negative for rash.  Allergic/Immunologic: Negative for environmental allergies and food allergies.  Neurological: Negative for weakness and headaches.  Psychiatric/Behavioral: Negative for agitation.       Objective:   Physical Exam  Constitutional: He appears well-developed and  well-nourished.  HENT:  Head: Normocephalic and atraumatic.  Right Ear: External ear normal.  Left Ear: External ear normal.  Nose: Nose normal.  Mouth/Throat: Oropharynx is clear and moist.  Eyes: EOM are normal. Pupils are equal, round, and reactive to light.  Neck: Normal range of motion. Neck supple. No thyromegaly present.  Cardiovascular: Normal rate, regular rhythm and normal heart sounds.   No murmur heard. Pulmonary/Chest: Effort normal and breath sounds normal. No respiratory distress. He has no wheezes.  Abdominal: Soft. Bowel sounds are normal. He exhibits no distension and no mass. There is no tenderness.  Genitourinary: Penis normal.  Mild BPH no hard nodules  Musculoskeletal: Normal range of motion. He exhibits no edema.  Lymphadenopathy:    He has no cervical adenopathy.  Neurological: He is alert. He exhibits normal muscle tone.  Skin: Skin is warm and dry. No erythema.  Psychiatric: He has a normal mood and affect. His behavior is normal. Judgment normal.          Assessment & Plan:  Wellness, safety, immunizations all reviewed, chronic health issues are followed by the [place]. Copy lab work given. Patient does not any need any renewals on his medications. Follow-up in 6 months.

## 2015-01-10 NOTE — Patient Instructions (Signed)
Diabetes Mellitus and Food It is important for you to manage your blood sugar (glucose) level. Your blood glucose level can be greatly affected by what you eat. Eating healthier foods in the appropriate amounts throughout the day at about the same time each day will help you control your blood glucose level. It can also help slow or prevent worsening of your diabetes mellitus. Healthy eating may even help you improve the level of your blood pressure and reach or maintain a healthy weight.  HOW CAN FOOD AFFECT ME? Carbohydrates Carbohydrates affect your blood glucose level more than any other type of food. Your dietitian will help you determine how many carbohydrates to eat at each meal and teach you how to count carbohydrates. Counting carbohydrates is important to keep your blood glucose at a healthy level, especially if you are using insulin or taking certain medicines for diabetes mellitus. Alcohol Alcohol can cause sudden decreases in blood glucose (hypoglycemia), especially if you use insulin or take certain medicines for diabetes mellitus. Hypoglycemia can be a life-threatening condition. Symptoms of hypoglycemia (sleepiness, dizziness, and disorientation) are similar to symptoms of having too much alcohol.  If your health care provider has given you approval to drink alcohol, do so in moderation and use the following guidelines:  Women should not have more than one drink per day, and men should not have more than two drinks per day. One drink is equal to:  12 oz of beer.  5 oz of wine.  1 oz of hard liquor.  Do not drink on an empty stomach.  Keep yourself hydrated. Have water, diet soda, or unsweetened iced tea.  Regular soda, juice, and other mixers might contain a lot of carbohydrates and should be counted. WHAT FOODS ARE NOT RECOMMENDED? As you make food choices, it is important to remember that all foods are not the same. Some foods have fewer nutrients per serving than other  foods, even though they might have the same number of calories or carbohydrates. It is difficult to get your body what it needs when you eat foods with fewer nutrients. Examples of foods that you should avoid that are high in calories and carbohydrates but low in nutrients include:  Trans fats (most processed foods list trans fats on the Nutrition Facts label).  Regular soda.  Juice.  Candy.  Sweets, such as cake, pie, doughnuts, and cookies.  Fried foods. WHAT FOODS CAN I EAT? Have nutrient-rich foods, which will nourish your body and keep you healthy. The food you should eat also will depend on several factors, including:  The calories you need.  The medicines you take.  Your weight.  Your blood glucose level.  Your blood pressure level.  Your cholesterol level. You also should eat a variety of foods, including:  Protein, such as meat, poultry, fish, tofu, nuts, and seeds (lean animal proteins are best).  Fruits.  Vegetables.  Dairy products, such as milk, cheese, and yogurt (low fat is best).  Breads, grains, pasta, cereal, rice, and beans.  Fats such as olive oil, trans fat-free margarine, canola oil, avocado, and olives. DOES EVERYONE WITH DIABETES MELLITUS HAVE THE SAME MEAL PLAN? Because every person with diabetes mellitus is different, there is not one meal plan that works for everyone. It is very important that you meet with a dietitian who will help you create a meal plan that is just right for you. Document Released: 07/23/2005 Document Revised: 10/31/2013 Document Reviewed: 09/22/2013 ExitCare Patient Information 2015 [employer]. This   information is not intended to replace advice given to you by your health care provider. Make sure you discuss any questions you have with your health care provider.  

## 2015-01-13 ENCOUNTER — Other Ambulatory Visit: Payer: Self-pay | Admitting: Family Medicine

## 2015-02-11 ENCOUNTER — Other Ambulatory Visit: Payer: Self-pay | Admitting: Family Medicine

## 2015-02-12 DIAGNOSIS — E784 Other hyperlipidemia: Secondary | ICD-10-CM | POA: Diagnosis not present

## 2015-02-12 DIAGNOSIS — K219 Gastro-esophageal reflux disease without esophagitis: Secondary | ICD-10-CM | POA: Diagnosis not present

## 2015-02-12 DIAGNOSIS — M2550 Pain in unspecified joint: Secondary | ICD-10-CM | POA: Diagnosis not present

## 2015-02-12 DIAGNOSIS — I119 Hypertensive heart disease without heart failure: Secondary | ICD-10-CM | POA: Diagnosis not present

## 2015-04-03 ENCOUNTER — Other Ambulatory Visit: Payer: Self-pay | Admitting: Family Medicine

## 2015-04-16 ENCOUNTER — Telehealth: Payer: Self-pay | Admitting: Family Medicine

## 2015-04-16 DIAGNOSIS — E785 Hyperlipidemia, unspecified: Secondary | ICD-10-CM

## 2015-04-16 DIAGNOSIS — Z79899 Other long term (current) drug therapy: Secondary | ICD-10-CM

## 2015-04-16 DIAGNOSIS — R7303 Prediabetes: Secondary | ICD-10-CM

## 2015-04-16 DIAGNOSIS — I10 Essential (primary) hypertension: Secondary | ICD-10-CM

## 2015-04-16 NOTE — Telephone Encounter (Signed)
Lipid, liver, metabolic 7, hemoglobin [id number], hyperlipidemia, prediabetes

## 2015-04-16 NOTE — Telephone Encounter (Signed)
Pt is requesting labs orders to be sent over.  Last labs per epic were: lipid,hepatic,bmp,psa,cbc,and microalbumin on 11/26/14

## 2015-04-16 NOTE — Telephone Encounter (Signed)
Orders ready. Pt notified.  

## 2015-04-17 ENCOUNTER — Other Ambulatory Visit: Payer: Self-pay | Admitting: Family Medicine

## 2015-04-22 DIAGNOSIS — I10 Essential (primary) hypertension: Secondary | ICD-10-CM | POA: Diagnosis not present

## 2015-04-22 DIAGNOSIS — Z79899 Other long term (current) drug therapy: Secondary | ICD-10-CM | POA: Diagnosis not present

## 2015-04-22 DIAGNOSIS — E785 Hyperlipidemia, unspecified: Secondary | ICD-10-CM | POA: Diagnosis not present

## 2015-04-22 DIAGNOSIS — R7309 Other abnormal glucose: Secondary | ICD-10-CM | POA: Diagnosis not present

## 2015-04-23 LAB — HEMOGLOBIN A1C
Est. average glucose Bld gHb Est-mCnc: 128 mg/dL
Hgb A1c MFr Bld: 6.1 % — ABNORMAL HIGH (ref 4.8–5.6)

## 2015-04-23 LAB — BASIC METABOLIC PANEL
BUN/Creatinine Ratio: 17 (ref 10–22)
BUN: 17 mg/dL (ref 8–27)
CALCIUM: 9.1 mg/dL (ref 8.6–10.2)
CHLORIDE: 102 mmol/L (ref 97–108)
CO2: 22 mmol/L (ref 18–29)
Creatinine, Ser: 1.03 mg/dL (ref 0.76–1.27)
GFR calc Af Amer: 84 mL/min/{1.73_m2} (ref >59)
GFR calc non Af Amer: 73 mL/min/{1.73_m2} (ref >59)
GLUCOSE: 98 mg/dL (ref 65–99)
POTASSIUM: 4 mmol/L (ref 3.5–5.2)
Sodium: 142 mmol/L (ref 134–144)

## 2015-04-23 LAB — HEPATIC FUNCTION PANEL
ALT: 21 IU/L (ref 0–44)
AST: 25 IU/L (ref 0–40)
Albumin: 4.1 g/dL (ref 3.5–4.8)
Alkaline Phosphatase: 91 IU/L (ref 39–117)
BILIRUBIN, DIRECT: 0.19 mg/dL (ref 0.00–0.40)
Bilirubin Total: 0.7 mg/dL (ref 0.0–1.2)
TOTAL PROTEIN: 6.5 g/dL (ref 6.0–8.5)

## 2015-04-23 LAB — LIPID PANEL
CHOLESTEROL TOTAL: 161 mg/dL (ref 100–199)
Chol/HDL Ratio: 3.7 ratio units (ref 0.0–5.0)
HDL: 43 mg/dL (ref >39)
LDL Calculated: 97 mg/dL (ref 0–99)
TRIGLYCERIDES: 106 mg/dL (ref 0–149)
VLDL Cholesterol Cal: 21 mg/dL (ref 5–40)

## 2015-05-15 DIAGNOSIS — G4700 Insomnia, unspecified: Secondary | ICD-10-CM | POA: Diagnosis not present

## 2015-05-15 DIAGNOSIS — M2550 Pain in unspecified joint: Secondary | ICD-10-CM | POA: Diagnosis not present

## 2015-05-15 DIAGNOSIS — R5382 Chronic fatigue, unspecified: Secondary | ICD-10-CM | POA: Diagnosis not present

## 2015-05-20 ENCOUNTER — Ambulatory Visit: Payer: TRICARE For Life (TFL) | Admitting: Internal Medicine

## 2015-05-21 DIAGNOSIS — R5383 Other fatigue: Secondary | ICD-10-CM | POA: Diagnosis not present

## 2015-05-21 DIAGNOSIS — E784 Other hyperlipidemia: Secondary | ICD-10-CM | POA: Diagnosis not present

## 2015-05-21 DIAGNOSIS — E559 Vitamin D deficiency, unspecified: Secondary | ICD-10-CM | POA: Diagnosis not present

## 2015-05-21 DIAGNOSIS — I110 Hypertensive heart disease with heart failure: Secondary | ICD-10-CM | POA: Diagnosis not present

## 2015-05-21 DIAGNOSIS — D510 Vitamin B12 deficiency anemia due to intrinsic factor deficiency: Secondary | ICD-10-CM | POA: Diagnosis not present

## 2015-05-21 DIAGNOSIS — R972 Elevated prostate specific antigen [PSA]: Secondary | ICD-10-CM | POA: Diagnosis not present

## 2015-06-18 ENCOUNTER — Telehealth: Payer: Self-pay | Admitting: Internal Medicine

## 2015-06-18 NOTE — Telephone Encounter (Signed)
Patient called to reschedule appointment for after CT scan. Appointment rescheduled for 08/01/15. Nothing further needed.

## 2015-06-24 ENCOUNTER — Ambulatory Visit: Payer: TRICARE For Life (TFL) | Admitting: Internal Medicine

## 2015-06-25 DIAGNOSIS — J449 Chronic obstructive pulmonary disease, unspecified: Secondary | ICD-10-CM | POA: Diagnosis not present

## 2015-06-25 DIAGNOSIS — G4710 Hypersomnia, unspecified: Secondary | ICD-10-CM | POA: Diagnosis not present

## 2015-06-25 DIAGNOSIS — G4733 Obstructive sleep apnea (adult) (pediatric): Secondary | ICD-10-CM | POA: Diagnosis not present

## 2015-06-25 DIAGNOSIS — G4701 Insomnia due to medical condition: Secondary | ICD-10-CM | POA: Diagnosis not present

## 2015-06-27 ENCOUNTER — Ambulatory Visit (INDEPENDENT_AMBULATORY_CARE_PROVIDER_SITE_OTHER): Payer: Medicare Other | Admitting: Family Medicine

## 2015-06-27 VITALS — BP 120/70 | Ht 65.5 in

## 2015-06-27 DIAGNOSIS — I10 Essential (primary) hypertension: Secondary | ICD-10-CM | POA: Diagnosis not present

## 2015-06-27 DIAGNOSIS — R7309 Other abnormal glucose: Secondary | ICD-10-CM

## 2015-06-27 DIAGNOSIS — R7303 Prediabetes: Secondary | ICD-10-CM

## 2015-06-27 DIAGNOSIS — E785 Hyperlipidemia, unspecified: Secondary | ICD-10-CM

## 2015-06-27 NOTE — Patient Instructions (Signed)
Diabetes Mellitus and Food It is important for you to manage your blood sugar (glucose) level. Your blood glucose level can be greatly affected by what you eat. Eating healthier foods in the appropriate amounts throughout the day at about the same time each day will help you control your blood glucose level. It can also help slow or prevent worsening of your diabetes mellitus. Healthy eating may even help you improve the level of your blood pressure and reach or maintain a healthy weight.  HOW CAN FOOD AFFECT ME? Carbohydrates Carbohydrates affect your blood glucose level more than any other type of food. Your dietitian will help you determine how many carbohydrates to eat at each meal and teach you how to count carbohydrates. Counting carbohydrates is important to keep your blood glucose at a healthy level, especially if you are using insulin or taking certain medicines for diabetes mellitus. Alcohol Alcohol can cause sudden decreases in blood glucose (hypoglycemia), especially if you use insulin or take certain medicines for diabetes mellitus. Hypoglycemia can be a life-threatening condition. Symptoms of hypoglycemia (sleepiness, dizziness, and disorientation) are similar to symptoms of having too much alcohol.  If your health care provider has given you approval to drink alcohol, do so in moderation and use the following guidelines:  Women should not have more than one drink per day, and men should not have more than two drinks per day. One drink is equal to:  12 oz of beer.  5 oz of wine.  1 oz of hard liquor.  Do not drink on an empty stomach.  Keep yourself hydrated. Have water, diet soda, or unsweetened iced tea.  Regular soda, juice, and other mixers might contain a lot of carbohydrates and should be counted. WHAT FOODS ARE NOT RECOMMENDED? As you make food choices, it is important to remember that all foods are not the same. Some foods have fewer nutrients per serving than other  foods, even though they might have the same number of calories or carbohydrates. It is difficult to get your body what it needs when you eat foods with fewer nutrients. Examples of foods that you should avoid that are high in calories and carbohydrates but low in nutrients include:  Trans fats (most processed foods list trans fats on the Nutrition Facts label).  Regular soda.  Juice.  Candy.  Sweets, such as cake, pie, doughnuts, and cookies.  Fried foods. WHAT FOODS CAN I EAT? Have nutrient-rich foods, which will nourish your body and keep you healthy. The food you should eat also will depend on several factors, including:  The calories you need.  The medicines you take.  Your weight.  Your blood glucose level.  Your blood pressure level.  Your cholesterol level. You also should eat a variety of foods, including:  Protein, such as meat, poultry, fish, tofu, nuts, and seeds (lean animal proteins are best).  Fruits.  Vegetables.  Dairy products, such as milk, cheese, and yogurt (low fat is best).  Breads, grains, pasta, cereal, rice, and beans.  Fats such as olive oil, trans fat-free margarine, canola oil, avocado, and olives. DOES EVERYONE WITH DIABETES MELLITUS HAVE THE SAME MEAL PLAN? Because every person with diabetes mellitus is different, there is not one meal plan that works for everyone. It is very important that you meet with a dietitian who will help you create a meal plan that is just right for you. Document Released: 07/23/2005 Document Revised: 10/31/2013 Document Reviewed: 09/22/2013 ExitCare Patient Information 2015 [employer]. This   information is not intended to replace advice given to you by your health care provider. Make sure you discuss any questions you have with your health care provider.  

## 2015-06-27 NOTE — Progress Notes (Signed)
   Subjective:    Patient ID: Miguel Morales, male    DOB: November 30, 1945, 69 y.o.   MRN: [id number]  Hypertension This is a chronic problem. The current episode started more than 1 year ago. Pertinent negatives include no chest pain. Risk factors for coronary artery disease include dyslipidemia and male gender. Treatments tried: norvasc. There are no compliance problems.    Discuss recent lab results Some back pain  Lungs no better, worse when active Saw allergist at [facility]- started Symbicort, will go back late seopt   No fevers Review of Systems  Constitutional: Negative for fever and activity change.  HENT: Negative for congestion, ear pain and rhinorrhea.   Eyes: Negative for discharge.  Respiratory: Positive for cough. Negative for wheezing.   Cardiovascular: Negative for chest pain.       Objective:   Physical Exam  Constitutional: He appears well-nourished. No distress.  Cardiovascular: Normal rate, regular rhythm and normal heart sounds.   No murmur heard. Pulmonary/Chest: Effort normal and breath sounds normal. No respiratory distress.  Musculoskeletal: He exhibits no edema.  Lymphadenopathy:    He has no cervical adenopathy.  Neurological: He is alert.  Psychiatric: His behavior is normal.  Vitals reviewed.    labs reviewed with the patient lipid looks good , liver looks good , fasting sugar slightly elevated in the past but normal now A1c slightly elevated 6.1      Assessment & Plan:   HTN- blood pressure under good control continue current measures  pulmonary stable follows up with pulmonology has CAT scan coming up in the near future as a follow-up to a pulmonary nodule Allergic rhinitis stable  hyperlipidemia stable continue current measures   reflux under good control continue current measures  prediabetes the importance of exercise watching diet keeping weight under check discussed follow-up in 6 months

## 2015-07-05 ENCOUNTER — Other Ambulatory Visit (HOSPITAL_COMMUNITY): Payer: Self-pay | Admitting: Respiratory Therapy

## 2015-07-05 DIAGNOSIS — J449 Chronic obstructive pulmonary disease, unspecified: Secondary | ICD-10-CM

## 2015-07-05 DIAGNOSIS — G4733 Obstructive sleep apnea (adult) (pediatric): Secondary | ICD-10-CM

## 2015-07-05 DIAGNOSIS — G4700 Insomnia, unspecified: Secondary | ICD-10-CM

## 2015-07-08 ENCOUNTER — Other Ambulatory Visit (HOSPITAL_COMMUNITY): Payer: Self-pay | Admitting: Respiratory Therapy

## 2015-07-09 ENCOUNTER — Ambulatory Visit: Payer: Medicare Other | Admitting: Family Medicine

## 2015-07-10 ENCOUNTER — Other Ambulatory Visit (HOSPITAL_COMMUNITY): Payer: Self-pay | Admitting: Respiratory Therapy

## 2015-07-12 ENCOUNTER — Ambulatory Visit: Payer: Medicare Other | Attending: Neurology | Admitting: Sleep Medicine

## 2015-07-12 DIAGNOSIS — G4733 Obstructive sleep apnea (adult) (pediatric): Secondary | ICD-10-CM | POA: Insufficient documentation

## 2015-07-12 DIAGNOSIS — J449 Chronic obstructive pulmonary disease, unspecified: Secondary | ICD-10-CM

## 2015-07-12 DIAGNOSIS — G4700 Insomnia, unspecified: Secondary | ICD-10-CM

## 2015-07-12 DIAGNOSIS — G4730 Sleep apnea, unspecified: Secondary | ICD-10-CM | POA: Diagnosis not present

## 2015-07-21 NOTE — Sleep Study (Signed)
  Brewer A. Merlene Laughter, MD     [email or web address]             NOCTURNAL POLYSOMNOGRAPHY   LOCATION: [facility]  Patient Name: Quindon, Denker Date: 07/12/2015 Gender: Male D.O.B: Dec 27, 1945 Age (years): 36 Referring Provider: Phillips Odor MD, ABSM Height (inches): 66 Interpreting Physician: Phillips Odor MD, ABSM Weight (lbs): 192 RPSGT: Rosebud Poles BMI: 31 MRN: [id number] Neck Size: 16.50 CLINICAL INFORMATION Sleep Study Type: NPSG  Indication for sleep study: Insomnia, Witnesses Apnea / Gasping During Sleep  Epworth Sleepiness Score: 5  SLEEP STUDY TECHNIQUE As per the AASM Manual for the Scoring of Sleep and Associated Events v2.3 (April 2016) with a hypopnea requiring 4% desaturations.  The channels recorded and monitored were frontal, central and occipital EEG, electrooculogram (EOG), submentalis EMG (chin), nasal and oral airflow, thoracic and abdominal wall motion, anterior tibialis EMG, snore microphone, electrocardiogram, and pulse oximetry.  MEDICATIONS Patient's medications include: N/A. Medications self-administered by patient during sleep study : No sleep medicine administered.  SLEEP ARCHITECTURE The study was initiated at 10:39:23 PM and ended at 6:05:34 AM.  Sleep onset time was 27.8 minutes and the sleep efficiency was 60.1%. The total sleep time was 268.0 minutes.  Stage REM latency was 153.5 minutes.  The patient spent 8.77% of the night in stage N1 sleep, 60.82% in stage N2 sleep, 0.00% in stage N3 and 30.41% in REM.  Alpha intrusion was absent.  Supine sleep was 21.83%.  RESPIRATORY PARAMETERS The overall apnea/hypopnea index (AHI) was 20.6 per hour. There were 52 total apneas, including 44 obstructive, 4 central and 4 mixed apneas. There were 40 hypopneas and 0 RERAs.  The AHI during Stage REM sleep was 18.4 per hour.  AHI while supine was 68.7 per hour.  The mean oxygen saturation was 92.05%. The minimum SpO2  during sleep was 76.00%.  Loud snoring was noted during this study.  CARDIAC DATA The 2 lead EKG demonstrated sinus rhythm. The mean heart rate was 67.80 beats per minute. Other EKG findings include: PVCs. LEG MOVEMENT DATA The total PLMS were 227 with a resulting PLMS index of 50.82. Associated arousal with leg movement index was 0.9 .  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 No significant associated arousals. Absent slow wave sleep.      Delano Metz, MD Diplomate, American Board of Sleep Medicine.

## 2015-07-22 ENCOUNTER — Other Ambulatory Visit (HOSPITAL_COMMUNITY): Payer: Self-pay | Admitting: Respiratory Therapy

## 2015-07-24 ENCOUNTER — Ambulatory Visit (INDEPENDENT_AMBULATORY_CARE_PROVIDER_SITE_OTHER)
Admission: RE | Admit: 2015-07-24 | Discharge: 2015-07-24 | Disposition: A | Discharge status: Home / Self Care | Payer: Medicare Other | Source: Ambulatory Visit | Attending: Internal Medicine | Admitting: Internal Medicine

## 2015-07-24 DIAGNOSIS — R911 Solitary pulmonary nodule: Secondary | ICD-10-CM

## 2015-07-24 DIAGNOSIS — R0600 Dyspnea, unspecified: Secondary | ICD-10-CM

## 2015-07-29 ENCOUNTER — Telehealth: Payer: Self-pay | Admitting: Internal Medicine

## 2015-07-29 NOTE — Telephone Encounter (Signed)
Lung nodule 1.5cm ggo stable nov 2015 -> sept 2016. Wil discuss at fu. Given size and persistence though stable. Might need lung cancer workup   Daneil Dan : can you get Super D done on this?

## 2015-07-30 NOTE — Telephone Encounter (Signed)
lmtcb for [place] at [facility].

## 2015-07-30 NOTE — Telephone Encounter (Signed)
Stacy called from [facility] She will send the super d cuts to [facility]

## 2015-07-30 NOTE — Telephone Encounter (Signed)
Will hold in my box till disc is received.

## 2015-08-01 ENCOUNTER — Ambulatory Visit (INDEPENDENT_AMBULATORY_CARE_PROVIDER_SITE_OTHER): Payer: Medicare Other | Admitting: Internal Medicine

## 2015-08-01 ENCOUNTER — Encounter: Payer: Self-pay | Admitting: Internal Medicine

## 2015-08-01 VITALS — BP 136/82 | HR 82 | Ht 65.5 in | Wt 194.2 lb

## 2015-08-01 DIAGNOSIS — R0602 Shortness of breath: Secondary | ICD-10-CM | POA: Diagnosis not present

## 2015-08-01 DIAGNOSIS — R911 Solitary pulmonary nodule: Secondary | ICD-10-CM

## 2015-08-01 NOTE — Patient Instructions (Addendum)
ICD-9-CM ICD-10-CM   1. Shortness of breath 786.05 R06.02   2. Lung nodule 9m Left Upper Lobe Ground Glass and 470mRight Right Lower lobe - seen April 2015 and NEW problem 793.11 R91.1     #Shortness of breath - Too bad still persists - No evidence of Interstistial Lung Disease  -Pulmonary stress test suggests that this is due to you slightly being overweight and due to physical deconditioning  - PFT suggests mild emphysema  PLAN -  Continue weight loss and exercise therapy and inhaler therapy - flu shot 08/01/2015    #Lung nodule 1335meft Upper Lobe Ground Glass and 4mm48mght Right Lower lobe - seen April 2015 and NEW problem.. STable Sept 2016  -  Please do next CT chest wo contrast  In 6 months  #Followup - after CT chest in 6 months

## 2015-08-01 NOTE — Progress Notes (Signed)
Subjective:    Patient ID: Miguel Morales, male    DOB: 15-Jun-1946, 69 y.o.   MRN: [id number]  HPI   OV 08/01/2015  Chief Complaint  Patient presents with  . Follow-up    Pt states his breathing is no better from last OV in 12/2014. Pt states he can detect a change in his voice- hoarseness. Pt c/o mild dry cough and upper mid back pain. Pt denies chest congestion and CP/tightness.      Lung nodule LUL 13m and 427mRLLL  -both new April 2015. We worked this up further with PET scan 05/11/10/5; indeterminate low prob. And with INDI Dx blood tests indiDx 05/31/14 puts probability of lung nodule being benign at 90%. Had CT chest now 14 2016 and the groundglass opacities unchanged at left upper lobe but reported at 15 mm the radiologist says is unchanged. He is only inclined to follow it given the stability over 18 months. I personally visualized the film   Dyspnea that is multifactorial and poorly explain but being treated as COPD: This is stable but persists and impairs his quality-of-life. He is on inhaler therapy. This no worsening of flare up. He will have a flu shot he is now seeing an allerg[facility]     Immunization History  Administered Date(s) Administered  . Influenza-Unspecified 11/10/2011, 07/25/2013, 08/07/2014  . Pneumococcal Conjugate-13 05/09/2014  . Pneumococcal Polysaccharide-23 11/10/2011  . Td 11/09/2010  . Zoster 12/14/2012      Current outpatient prescriptions:  .  amLODipine (NORVASC) 10 MG tablet, TAKE 1 TABLET DAILY, Disp: 90 tablet, Rfl: 1 .  Ascorbic Acid (VITAMIN C) 1000 MG tablet, Take 1,000 mg by mouth daily., Disp: , Rfl:  .  aspirin 81 MG tablet, Take 81 mg by mouth daily., Disp: , Rfl:  .  atorvastatin (LIPITOR) 20 MG tablet, TAKE 1 TABLET DAILY, Disp: 90 tablet, Rfl: 1 .  budesonide-formoterol (SYMBICORT) 160-4.5 MCG/ACT inhaler, Inhale 2 puffs into the lungs 2 (two) times daily., Disp: , Rfl:  .  Coenzyme Q10 (COQ10)  400 MG CAPS, Take by mouth daily., Disp: , Rfl:  .  COMBIVENT RESPIMAT 20-100 MCG/ACT AERS respimat, INHALE 1 PUFF FOUR TIMES A DAY, Disp: 1 Inhaler, Rfl: 2 .  Flaxseed, Linseed, (FLAXSEED OIL) 1000 MG CAPS, Take 1 capsule by mouth daily., Disp: , Rfl:  .  fluticasone (FLONASE) 50 MCG/ACT nasal spray, USE 1 SPRAY IN EACH NOSTRIL DAILY AS NEEDED, Disp: 16 g, Rfl: 5 .  hydrOXYzine (ATARAX/VISTARIL) 10 MG tablet, Take 10 mg by mouth 3 (three) times daily as needed. 2 tablets once daily, Disp: , Rfl:  .  lidocaine (XYLOCAINE) 5 % ointment, Apply 1 application topically as needed., Disp: , Rfl:  .  Loratadine 10 MG CAPS, Take by mouth daily. PRN, Disp: , Rfl:  .  Melatonin-Pyridoxine (MELATIN PO), Take by mouth at bedtime as needed., Disp: , Rfl:  .  omeprazole (PRILOSEC) 20 MG capsule, TAKE 1 CAPSULE DAILY AS NEEDED, Disp: 90 capsule, Rfl: 1 .  SPIRIVA HANDIHALER 18 MCG inhalation capsule, INHALE THE CONTENTS OF 1 CAPSULE DAILY, Disp: 1 capsule, Rfl: 5 .  triamcinolone (KENALOG) 0.025 % cream, Apply 1 application topically 2 (two) times daily., Disp: , Rfl:  .  methocarbamol (ROBAXIN) 500 MG tablet, Take 500 mg by mouth 2 (two) times daily. , Disp: , Rfl:    Review of Systems  Constitutional: Negative for fever and unexpected weight change.  HENT: Negative for  congestion, dental problem, ear pain, nosebleeds, postnasal drip, rhinorrhea, sinus pressure, sneezing, sore throat and trouble swallowing.   Eyes: Negative for redness and itching.  Respiratory: Positive for cough and shortness of breath. Negative for chest tightness and wheezing.   Cardiovascular: Negative for palpitations and leg swelling.  Gastrointestinal: Negative for nausea and vomiting.  Genitourinary: Negative for dysuria.  Musculoskeletal: Negative for joint swelling.  Skin: Negative for rash.  Neurological: Negative for headaches.  Hematological: Does not bruise/bleed easily.  Psychiatric/Behavioral: Negative for dysphoric  mood. The patient is not nervous/anxious.        Objective:   Physical Exam  Constitutional: He is oriented to person, place, and time. He appears well-developed and well-nourished. No distress.  HENT:  Head: Normocephalic and atraumatic.  Right Ear: External ear normal.  Left Ear: External ear normal.  Mouth/Throat: Oropharynx is clear and moist. No oropharyngeal exudate.  Eyes: Conjunctivae and EOM are normal. Pupils are equal, round, and reactive to light. Right eye exhibits no discharge. Left eye exhibits no discharge. No scleral icterus.  Neck: Normal range of motion. Neck supple. No JVD present. No tracheal deviation present. No thyromegaly present.  Cardiovascular: Normal rate, regular rhythm and intact distal pulses.  Exam reveals no gallop and no friction rub.   No murmur heard. Pulmonary/Chest: Effort normal and breath sounds normal. No respiratory distress. He has no wheezes. He has no rales. He exhibits no tenderness.  Abdominal: Soft. Bowel sounds are normal. He exhibits no distension and no mass. There is no tenderness. There is no rebound and no guarding.  Musculoskeletal: Normal range of motion. He exhibits no edema or tenderness.  Lymphadenopathy:    He has no cervical adenopathy.  Neurological: He is alert and oriented to person, place, and time. He has normal reflexes. No cranial nerve deficit. Coordination normal.  Skin: Skin is warm and dry. No rash noted. He is not diaphoretic. No erythema. No pallor.  Psychiatric: He has a normal mood and affect. His behavior is normal. Judgment and thought content normal.  Nursing note and vitals reviewed.    Filed Vitals:   08/01/15 1449  BP: 136/82  Pulse: 82  Height: 5' 5.5" (1.664 m)  Weight: 194 lb 3.2 oz (88.089 kg)  SpO2: 97%        Assessment & Plan:     ICD-9-CM ICD-10-CM   1. Shortness of breath 786.05 R06.02   2. Lung nodule 17m Left Upper Lobe Ground Glass and 45mRight Right Lower lobe - seen April 2015  and NEW problem 793.11 R91.1    #Shortness of breath - Too bad still persists - No evidence of Interstistial Lung Disease  -Pulmonary stress test suggests that this is due to you slightly being overweight and due to physical deconditioning  - PFT suggests mild emphysema  PLAN -  Continue weight loss and exercise therapy and inhaler therapy - flu shot 08/01/2015    #Lung nodule 1313meft Upper Lobe Ground Glass and 4mm8mght Right Lower lobe - seen April 2015 and NEW problem.. STable Sept 2016  -  Please do next CT chest wo contrast  In 6 months  #Followup - after CT chest in 6 months   Dr. MuraBrand MalesD., F.C.[facility] Medicine Staff Physician [facility] Pager: [phone] no answer or between  15:00h - 7:00h: call [phone]  08/01/2015 3:22 PM

## 2015-08-01 NOTE — Telephone Encounter (Signed)
Received Super D, gave disc to MR. Nothing further needed at this time.

## 2015-08-08 ENCOUNTER — Other Ambulatory Visit: Payer: Self-pay | Admitting: Family Medicine

## 2015-08-22 ENCOUNTER — Ambulatory Visit (HOSPITAL_COMMUNITY)
Admission: RE | Admit: 2015-08-22 | Discharge: 2015-08-22 | Disposition: A | Discharge status: Home / Self Care | Payer: Medicare Other | Source: Ambulatory Visit | Attending: Family Medicine | Admitting: Family Medicine

## 2015-08-22 ENCOUNTER — Ambulatory Visit (INDEPENDENT_AMBULATORY_CARE_PROVIDER_SITE_OTHER): Payer: Medicare Other | Admitting: Family Medicine

## 2015-08-22 ENCOUNTER — Encounter: Payer: Self-pay | Admitting: Family Medicine

## 2015-08-22 VITALS — BP 132/88 | Ht 66.0 in | Wt 194.0 lb

## 2015-08-22 DIAGNOSIS — L739 Follicular disorder, unspecified: Secondary | ICD-10-CM

## 2015-08-22 DIAGNOSIS — M25511 Pain in right shoulder: Secondary | ICD-10-CM | POA: Diagnosis not present

## 2015-08-22 MED ORDER — CEPHALEXIN 500 MG PO CAPS: 500.0000 mg | ORAL_CAPSULE | Freq: Four times a day (QID) | ORAL | Status: DC
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
## 2015-08-26 DIAGNOSIS — G4701 Insomnia due to medical condition: Secondary | ICD-10-CM | POA: Diagnosis not present

## 2015-08-26 DIAGNOSIS — G4733 Obstructive sleep apnea (adult) (pediatric): Secondary | ICD-10-CM | POA: Diagnosis not present

## 2015-08-26 DIAGNOSIS — G4710 Hypersomnia, unspecified: Secondary | ICD-10-CM | POA: Diagnosis not present

## 2015-08-26 DIAGNOSIS — J449 Chronic obstructive pulmonary disease, unspecified: Secondary | ICD-10-CM | POA: Diagnosis not present

## 2015-08-26 DIAGNOSIS — R413 Other amnesia: Secondary | ICD-10-CM | POA: Diagnosis not present

## 2015-09-03 DIAGNOSIS — E784 Other hyperlipidemia: Secondary | ICD-10-CM | POA: Diagnosis not present

## 2015-09-03 DIAGNOSIS — R5382 Chronic fatigue, unspecified: Secondary | ICD-10-CM | POA: Diagnosis not present

## 2015-09-03 DIAGNOSIS — M2550 Pain in unspecified joint: Secondary | ICD-10-CM | POA: Diagnosis not present

## 2015-09-03 DIAGNOSIS — I119 Hypertensive heart disease without heart failure: Secondary | ICD-10-CM | POA: Diagnosis not present

## 2015-09-24 ENCOUNTER — Other Ambulatory Visit: Payer: Self-pay | Admitting: Family Medicine

## 2015-09-29 ENCOUNTER — Other Ambulatory Visit: Payer: Self-pay | Admitting: Family Medicine

## 2015-10-13 ENCOUNTER — Other Ambulatory Visit: Payer: Self-pay | Admitting: Family Medicine

## 2015-10-24 ENCOUNTER — Ambulatory Visit (INDEPENDENT_AMBULATORY_CARE_PROVIDER_SITE_OTHER): Payer: Medicare Other | Admitting: Family Medicine

## 2015-10-24 ENCOUNTER — Encounter: Payer: Self-pay | Admitting: Family Medicine

## 2015-10-24 VITALS — BP 122/82 | Ht 66.0 in | Wt 205.4 lb

## 2015-10-24 DIAGNOSIS — I10 Essential (primary) hypertension: Secondary | ICD-10-CM | POA: Diagnosis not present

## 2015-10-24 DIAGNOSIS — M25511 Pain in right shoulder: Secondary | ICD-10-CM | POA: Diagnosis not present

## 2015-10-24 DIAGNOSIS — R21 Rash and other nonspecific skin eruption: Secondary | ICD-10-CM

## 2015-10-24 MED ORDER — MOMETASONE FUROATE 0.1 % EX SOLN: Freq: Every day | CUTANEOUS | Status: DC
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
## 2015-10-29 ENCOUNTER — Telehealth: Payer: Self-pay | Admitting: Family Medicine

## 2015-10-29 DIAGNOSIS — G4733 Obstructive sleep apnea (adult) (pediatric): Secondary | ICD-10-CM | POA: Diagnosis not present
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
## 2015-10-31 NOTE — Telephone Encounter (Signed)
Was this done? Do not see note in the Letters on Epic

## 2015-11-05 ENCOUNTER — Encounter: Payer: Self-pay | Admitting: Family Medicine

## 2015-11-05 NOTE — Telephone Encounter (Signed)
I spoke with the patient. A letter was dictated. He will pick this up on Wednesday the 28th.

## 2015-11-06 DIAGNOSIS — H6503 Acute serous otitis media, bilateral: Secondary | ICD-10-CM | POA: Diagnosis not present

## 2015-11-14 DIAGNOSIS — M4806 Spinal stenosis, lumbar region: Secondary | ICD-10-CM | POA: Diagnosis not present

## 2015-11-22 DIAGNOSIS — B369 Superficial mycosis, unspecified: Secondary | ICD-10-CM | POA: Diagnosis not present

## 2015-11-22 DIAGNOSIS — H6243 Otitis externa in other diseases classified elsewhere, bilateral: Secondary | ICD-10-CM | POA: Diagnosis not present

## 2015-11-26 ENCOUNTER — Telehealth: Payer: Self-pay | Admitting: Internal Medicine

## 2015-11-26 ENCOUNTER — Other Ambulatory Visit: Payer: Self-pay

## 2015-11-26 DIAGNOSIS — R197 Diarrhea, unspecified: Secondary | ICD-10-CM

## 2015-11-26 NOTE — Addendum Note (Signed)
Addended by: Claudina Lick on: 11/26/2015 09:25 AM   Modules accepted: Orders

## 2015-11-26 NOTE — Telephone Encounter (Signed)
Lab orders and container are at the front desk. pts wife is aware.

## 2015-11-26 NOTE — Telephone Encounter (Signed)
Received  Text from patient's RN wife/friend.  Patient with watery diarrhea and incontinence after 2 rounds of cefinir for bronchitis/URI. We need to get him to submit a fresh stool sample for C. Difficile assay ASAP. Call Jackelyn Poling, patient's wife that [phone] to facilitate this a.m. Thanks.

## 2015-11-28 ENCOUNTER — Telehealth: Payer: Self-pay | Admitting: Family Medicine

## 2015-11-28 DIAGNOSIS — Z79899 Other long term (current) drug therapy: Secondary | ICD-10-CM

## 2015-11-28 DIAGNOSIS — Z125 Encounter for screening for malignant neoplasm of prostate: Secondary | ICD-10-CM

## 2015-11-28 DIAGNOSIS — E785 Hyperlipidemia, unspecified: Secondary | ICD-10-CM

## 2015-11-28 NOTE — Telephone Encounter (Signed)
Pt is requesting lab orders to be sent over for his upcoming appt. Last labs per epic were: lipid,hepatic,bmp,and a1c on 04/22/15

## 2015-11-28 NOTE — Telephone Encounter (Signed)
Lipid, liver, metabolic 7, PSA

## 2015-11-28 NOTE — Telephone Encounter (Signed)
Blood work ordered in EPIC. Patient notified. 

## 2015-11-29 DIAGNOSIS — M4316 Spondylolisthesis, lumbar region: Secondary | ICD-10-CM | POA: Diagnosis not present

## 2015-11-29 DIAGNOSIS — M5416 Radiculopathy, lumbar region: Secondary | ICD-10-CM | POA: Diagnosis not present

## 2015-12-05 DIAGNOSIS — Z125 Encounter for screening for malignant neoplasm of prostate: Secondary | ICD-10-CM | POA: Diagnosis not present

## 2015-12-05 DIAGNOSIS — E785 Hyperlipidemia, unspecified: Secondary | ICD-10-CM | POA: Diagnosis not present

## 2015-12-05 DIAGNOSIS — Z79899 Other long term (current) drug therapy: Secondary | ICD-10-CM | POA: Diagnosis not present

## 2015-12-06 ENCOUNTER — Encounter: Payer: Self-pay | Admitting: Family Medicine

## 2015-12-06 LAB — BASIC METABOLIC PANEL
BUN/Creatinine Ratio: 20 (ref 10–22)
BUN: 22 mg/dL (ref 8–27)
CALCIUM: 9.8 mg/dL (ref 8.6–10.2)
CHLORIDE: 100 mmol/L (ref 96–106)
CO2: 22 mmol/L (ref 18–29)
Creatinine, Ser: 1.1 mg/dL (ref 0.76–1.27)
GFR calc non Af Amer: 67 mL/min/{1.73_m2} (ref >59)
GFR, EST AFRICAN AMERICAN: 78 mL/min/{1.73_m2} (ref >59)
GLUCOSE: 113 mg/dL — AB (ref 65–99)
POTASSIUM: 4.7 mmol/L (ref 3.5–5.2)
Sodium: 138 mmol/L (ref 134–144)

## 2015-12-06 LAB — HEPATIC FUNCTION PANEL
ALT: 22 IU/L (ref 0–44)
AST: 21 IU/L (ref 0–40)
Albumin: 4.4 g/dL (ref 3.5–4.8)
Alkaline Phosphatase: 96 IU/L (ref 39–117)
BILIRUBIN TOTAL: 0.5 mg/dL (ref 0.0–1.2)
Bilirubin, Direct: 0.18 mg/dL (ref 0.00–0.40)
Total Protein: 7 g/dL (ref 6.0–8.5)

## 2015-12-06 LAB — LIPID PANEL
CHOLESTEROL TOTAL: 204 mg/dL — AB (ref 100–199)
Chol/HDL Ratio: 3.9 ratio units (ref 0.0–5.0)
HDL: 52 mg/dL (ref >39)
LDL CALC: 126 mg/dL — AB (ref 0–99)
TRIGLYCERIDES: 131 mg/dL (ref 0–149)
VLDL CHOLESTEROL CAL: 26 mg/dL (ref 5–40)

## 2015-12-06 LAB — PSA: Prostate Specific Ag, Serum: 1.1 ng/mL (ref 0.0–4.0)

## 2015-12-18 DIAGNOSIS — M4806 Spinal stenosis, lumbar region: Secondary | ICD-10-CM | POA: Diagnosis not present

## 2015-12-18 DIAGNOSIS — Z6832 Body mass index (BMI) 32.0-32.9, adult: Secondary | ICD-10-CM | POA: Diagnosis not present

## 2015-12-23 ENCOUNTER — Encounter: Payer: Self-pay | Admitting: Family Medicine

## 2015-12-23 ENCOUNTER — Other Ambulatory Visit: Payer: Self-pay | Admitting: Family Medicine

## 2015-12-23 ENCOUNTER — Ambulatory Visit (INDEPENDENT_AMBULATORY_CARE_PROVIDER_SITE_OTHER): Payer: Medicare Other | Admitting: Family Medicine

## 2015-12-23 VITALS — BP 114/82 | Ht 65.5 in | Wt 201.0 lb

## 2015-12-23 DIAGNOSIS — Z0000 Encounter for general adult medical examination without abnormal findings: Secondary | ICD-10-CM | POA: Diagnosis not present

## 2015-12-23 DIAGNOSIS — I10 Essential (primary) hypertension: Secondary | ICD-10-CM

## 2015-12-23 DIAGNOSIS — E785 Hyperlipidemia, unspecified: Secondary | ICD-10-CM | POA: Diagnosis not present

## 2015-12-23 DIAGNOSIS — R739 Hyperglycemia, unspecified: Secondary | ICD-10-CM

## 2015-12-23 DIAGNOSIS — R490 Dysphonia: Secondary | ICD-10-CM | POA: Diagnosis not present

## 2015-12-23 DIAGNOSIS — Z974 Presence of external hearing-aid: Secondary | ICD-10-CM | POA: Diagnosis not present

## 2015-12-23 DIAGNOSIS — R9431 Abnormal electrocardiogram [ECG] [EKG]: Secondary | ICD-10-CM | POA: Diagnosis not present

## 2015-12-23 DIAGNOSIS — Z9989 Dependence on other enabling machines and devices: Secondary | ICD-10-CM | POA: Diagnosis not present

## 2015-12-23 DIAGNOSIS — R0789 Other chest pain: Secondary | ICD-10-CM

## 2015-12-23 DIAGNOSIS — H9113 Presbycusis, bilateral: Secondary | ICD-10-CM | POA: Diagnosis not present

## 2015-12-23 DIAGNOSIS — K219 Gastro-esophageal reflux disease without esophagitis: Secondary | ICD-10-CM | POA: Diagnosis not present

## 2015-12-23 LAB — POCT GLYCOSYLATED HEMOGLOBIN (HGB A1C): Hemoglobin A1C: 5.8

## 2015-12-23 MED ORDER — ALBUTEROL SULFATE HFA 108 (90 BASE) MCG/ACT IN AERS: 2.0000 | INHALATION_SPRAY | Freq: Four times a day (QID) | RESPIRATORY_TRACT | Status: DC | PRN

## 2015-12-23 MED ORDER — AMLODIPINE BESYLATE 10 MG PO TABS: 10.0000 mg | ORAL_TABLET | Freq: Every day | ORAL | Status: DC

## 2015-12-23 MED ORDER — ATORVASTATIN CALCIUM 20 MG PO TABS: 20.0000 mg | ORAL_TABLET | Freq: Every day | ORAL | Status: DC

## 2015-12-23 MED ORDER — OMEPRAZOLE 20 MG PO CPDR: DELAYED_RELEASE_CAPSULE | ORAL | Status: DC

## 2015-12-23 MED ORDER — TAMSULOSIN HCL 0.4 MG PO CAPS: ORAL_CAPSULE | ORAL | Status: DC

## 2015-12-23 MED ORDER — ATORVASTATIN CALCIUM 40 MG PO TABS: 40.0000 mg | ORAL_TABLET | Freq: Every day | ORAL | Status: DC

## 2015-12-23 NOTE — Patient Instructions (Signed)
Do fasting labs/ and urine in 8 weeks at [facility] april I will put the orders in

## 2015-12-23 NOTE — Progress Notes (Signed)
Referral to Cardiology in [place].

## 2015-12-23 NOTE — Progress Notes (Signed)
   Subjective:    Patient ID: Miguel Morales, male    DOB: 19-Nov-1945, 70 y.o.   MRN: [id number]  HPI AWV- Annual Wellness Visit  The patient was seen for their annual wellness visit. The patient's past medical history, surgical history, and family history were reviewed. Pertinent vaccines were reviewed ( tetanus, pneumonia, shingles, flu) The patient's medication list was reviewed and updated.  The height and weight were entered. The patient's current BMI is: 32  Cognitive screening was completed. Outcome of Mini - Cog: Passed cognitive screening.  Falls within the past 6 months: None  Current tobacco usage: None (All patients who use tobacco were given written and verbal information on quitting)  Recent listing of emergency department/hospitalizations over the past year were reviewed.  current specialist the patient sees on a regular basis:Dr.Elsner (Neurologist) , Pulmonary, ENT   Medicare annual wellness visit patient questionnaire was reviewed.  A written screening schedule for the patient for the next 5-10 years was given. Appropriate discussion of followup regarding next visit was discussed.  Patient states has concerns of chest pain that radiates through to back. Also concerns of raspy voice.  Results for orders placed or performed in visit on 12/23/15  POCT HgB A1C  Result Value Ref Range   Hemoglobin A1C 5.8    patient with intermittent chest pain he states her last for 3-4 minutes at a time he describes as a tightness in the chest radiates to the back not down the arms no shortness of breath with it sometimes occurs at rest occasionally with activity  He states he does do a fair amount of walking his COPD limits it but he denies any significant trouble with it  Patient does have abnormal lipid and glucose on his lab work. Patient relates compliance with medicine tries to be healthy and is eating patterns  Review of Systems See above discussion regarding chest  discomfort in breathing. Denies headaches difficulty swallowing abdominal pain rectal bleeding hematuria denies joint pain    Objective:   Physical Exam Neck no masses lungs are clear no crackles heart is regular pulse normal abdomen soft blood pressures good extremities no edema skin warm dry neurologic grossly normal  Wellness exam was done but other identifiable health issues were addressed along with the EKG was some atypical changes. Referral to cardiology     Assessment & Plan:  [facility] dietary discussed. Importance of regular physical activity discussed  Intermittent chest pain tightness. Does not sound like COPD. He has multiple risk factors for heart disease. I recommend cardiology consultation with probable stress test  COPD patient overall stable continue current inhalers.  Hyperlipidemia increase Lipitor to 40 mg  Intermittent hoarseness he happens to be seen ENT later today I told him to have the ENT look at his vocal cords could be due to reflux could be just aging could be related to polyps or other issues.

## 2015-12-23 NOTE — Addendum Note (Signed)
Addended by: Launa Grill on: 12/23/2015 01:33 PM   Modules accepted: Orders

## 2015-12-24 ENCOUNTER — Encounter: Payer: Self-pay | Admitting: Family Medicine

## 2015-12-30 ENCOUNTER — Encounter: Payer: Self-pay | Admitting: Family Medicine

## 2016-01-06 ENCOUNTER — Ambulatory Visit: Payer: Medicare Other | Admitting: Cardiovascular Disease

## 2016-01-06 DIAGNOSIS — E662 Morbid (severe) obesity with alveolar hypoventilation: Secondary | ICD-10-CM | POA: Diagnosis not present

## 2016-01-06 DIAGNOSIS — M2550 Pain in unspecified joint: Secondary | ICD-10-CM | POA: Diagnosis not present

## 2016-01-06 DIAGNOSIS — M545 Low back pain: Secondary | ICD-10-CM | POA: Diagnosis not present

## 2016-01-06 DIAGNOSIS — R5382 Chronic fatigue, unspecified: Secondary | ICD-10-CM | POA: Diagnosis not present

## 2016-01-07 ENCOUNTER — Ambulatory Visit (INDEPENDENT_AMBULATORY_CARE_PROVIDER_SITE_OTHER): Payer: Medicare Other | Admitting: Cardiovascular Disease

## 2016-01-07 ENCOUNTER — Encounter: Payer: Self-pay | Admitting: Cardiovascular Disease

## 2016-01-07 ENCOUNTER — Encounter: Payer: Self-pay | Admitting: *Deleted

## 2016-01-07 VITALS — BP 152/80 | HR 81 | Ht 66.0 in | Wt 209.0 lb

## 2016-01-07 DIAGNOSIS — I10 Essential (primary) hypertension: Secondary | ICD-10-CM | POA: Diagnosis not present

## 2016-01-07 DIAGNOSIS — R079 Chest pain, unspecified: Secondary | ICD-10-CM

## 2016-01-07 DIAGNOSIS — E785 Hyperlipidemia, unspecified: Secondary | ICD-10-CM

## 2016-01-07 DIAGNOSIS — R9431 Abnormal electrocardiogram [ECG] [EKG]: Secondary | ICD-10-CM

## 2016-01-07 NOTE — Patient Instructions (Signed)
Your physician has requested that you have an echocardiogram. Echocardiography is a painless test that uses sound waves to create images of your heart. It provides your doctor with information about the size and shape of your heart and how well your heart's chambers and valves are working. This procedure takes approximately one hour. There are no restrictions for this procedure. Your physician has requested that you have a lexiscan myoview. For further information please visit [email or web address]. Please follow instruction sheet, as given. Office will contact with results via phone or letter.   Continue all current medications. Follow up in  6 weeks.

## 2016-01-07 NOTE — Progress Notes (Signed)
Patient ID: Miguel Morales, male   DOB: 02-23-1946, 70 y.o.   MRN: [id number]       CARDIOLOGY CONSULT NOTE  Patient ID: Miguel Morales MRN: [id number] DOB/AGE: 09-12-1946 70 y.o.  Admit date: (Not on file) Primary Physician Sallee Lange, MD  Reason for Consultation: chest pain  HPI: The patient is a 70 year old male with a past medical history significant for hypertension, hyperlipidemia , and COPD who has been referred for the evaluation of chest pain. He underwent a low risk nuclear stress test on 04/01/12 , calculated LVEF 65%. There was no evidence of myocardial ischemia or scar.  He underwent a normal cardiopulmonary exercise test on 4 /8/15.  ECG on 12/23/15 showed sinus rhythm with anteroseptal infarct.  He developed a cough and has noticed chest pain since then. Denies exertional chest pain. Has COPD and chronic exertional dyspnea which is no worse. Has not noticed diminished activity levels over last several months.  Denies orthopnea, PND, and leg swelling. Has LUL nodule followed by pulmonary.  Walks 1.2 miles 3-5 days with his wife without difficulty.  Soc: Served in [employer].  Allergies  Allergen Reactions  . Sulfa Antibiotics     Current Outpatient Prescriptions  Medication Sig Dispense Refill  . acetaminophen (TYLENOL) 500 MG tablet Take 500 mg by mouth every 6 (six) hours as needed.    Marland Kitchen albuterol (PROVENTIL HFA;VENTOLIN HFA) 108 (90 Base) MCG/ACT inhaler Inhale 2 puffs into the lungs every 6 (six) hours as needed for wheezing. 3 Inhaler 2  . amLODipine (NORVASC) 10 MG tablet Take 1 tablet (10 mg total) by mouth daily. 90 tablet 1  . Ascorbic Acid (VITAMIN C) 1000 MG tablet Take 1,000 mg by mouth daily.    Marland Kitchen aspirin 81 MG tablet Take 81 mg by mouth daily.    Marland Kitchen atorvastatin (LIPITOR) 40 MG tablet Take 1 tablet (40 mg total) by mouth daily. 90 tablet 1  . budesonide-formoterol (SYMBICORT) 160-4.5 MCG/ACT inhaler Inhale 2 puffs into the lungs 2 (two) times  daily.    . Coenzyme Q10 (COQ10) 400 MG CAPS Take by mouth daily.    . Flaxseed, Linseed, (FLAXSEED OIL) 1000 MG CAPS Take 1 capsule by mouth daily.    . fluticasone (FLONASE) 50 MCG/ACT nasal spray USE 1 SPRAY IN EACH NOSTRIL DAILY AS NEEDED 16 g 5  . lidocaine (XYLOCAINE) 5 % ointment Apply 1 application topically as needed.    . Loratadine 10 MG CAPS Take by mouth daily. PRN    . Melatonin-Pyridoxine (MELATIN PO) Take by mouth at bedtime as needed.    . mometasone (ELOCON) 0.1 % lotion APPLY TOPICALLY DAILY 60 mL 3  . omeprazole (PRILOSEC) 20 MG capsule TAKE 1 CAPSULE DAILY AS NEEDED 90 capsule 1  . SPIRIVA HANDIHALER 18 MCG inhalation capsule INHALE THE CONTENTS OF 1 CAPSULE DAILY 90 capsule 1  . tamsulosin (FLOMAX) 0.4 MG CAPS capsule Take 1 tablet by mouth at bedtime. 90 capsule 1  . triamcinolone (KENALOG) 0.025 % cream Apply 1 application topically 2 (two) times daily.    . Turmeric POWD by Does not apply route.     No current facility-administered medications for this visit.    Past Medical History  Diagnosis Date  . COPD (chronic obstructive pulmonary disease) ([place])   . Mixed hyperlipidemia   . GERD (gastroesophageal reflux disease)   . Cancer ([place])   . Asthma     Past Surgical History  Procedure Laterality Date  . Knee arthroscopy    .  Septoplasty    . Circumcision    . Colonoscopy  03/04/2004    XFG:[id number] diverticula.  The remainder of the colonic mucosa appeared normal/normal rectum  . Colonoscopy  05/22/2009    RCV:ELFYBOF mucosa appeared normal/Left-sided diverticula/Normal rectum  . Colonoscopy N/A 08/02/2014    Procedure: COLONOSCOPY;  Surgeon: Daneil Dolin, MD;  Location: [facility];  Service: Endoscopy;  Laterality: N/A;  2:00 PM    Social History   Social History  . Marital Status: Married    Spouse Name: N/A  . Number of Children: N/A  . Years of Education: N/A   Occupational History  . Not on file.   Social History Main Topics  .  Smoking status: Former Smoker -- 1.00 packs/day for 30 years    Types: Cigarettes    Quit date: 11/10/2003  . Smokeless tobacco: Never Used  . Alcohol Use: Yes     Comment: Occasionally  . Drug Use: No  . Sexual Activity: Not on file   Other Topics Concern  . Not on file   Social History Narrative     No family history of premature CAD in 1st degree relatives.  Prior to Admission medications   Medication Sig Start Date End Date Taking? Authorizing Provider  acetaminophen (TYLENOL) 500 MG tablet Take 500 mg by mouth every 6 (six) hours as needed.   Yes Historical Provider, MD  albuterol (PROVENTIL HFA;VENTOLIN HFA) 108 (90 Base) MCG/ACT inhaler Inhale 2 puffs into the lungs every 6 (six) hours as needed for wheezing. 12/23/15  Yes [name], MD  amLODipine (NORVASC) 10 MG tablet Take 1 tablet (10 mg total) by mouth daily. 12/23/15  Yes [name], MD  Ascorbic Acid (VITAMIN C) 1000 MG tablet Take 1,000 mg by mouth daily.   Yes Historical Provider, MD  aspirin 81 MG tablet Take 81 mg by mouth daily.   Yes Historical Provider, MD  atorvastatin (LIPITOR) 40 MG tablet Take 1 tablet (40 mg total) by mouth daily. 12/23/15  Yes [name], MD  budesonide-formoterol (SYMBICORT) 160-4.5 MCG/ACT inhaler Inhale 2 puffs into the lungs 2 (two) times daily.   Yes Historical Provider, MD  Coenzyme Q10 (COQ10) 400 MG CAPS Take by mouth daily.   Yes Historical Provider, MD  Flaxseed, Linseed, (FLAXSEED OIL) 1000 MG CAPS Take 1 capsule by mouth daily.   Yes Historical Provider, MD  fluticasone (FLONASE) 50 MCG/ACT nasal spray USE 1 SPRAY IN EACH NOSTRIL DAILY AS NEEDED 01/14/15  Yes [name], MD  lidocaine (XYLOCAINE) 5 % ointment Apply 1 application topically as needed.   Yes Historical Provider, MD  Loratadine 10 MG CAPS Take by mouth daily. PRN   Yes Historical Provider, MD  Melatonin-Pyridoxine (MELATIN PO) Take by mouth at bedtime as needed.   Yes Historical Provider, MD  mometasone  (ELOCON) 0.1 % lotion APPLY TOPICALLY DAILY 12/24/15  Yes [name], MD  omeprazole (PRILOSEC) 20 MG capsule TAKE 1 CAPSULE DAILY AS NEEDED 12/23/15  Yes [name], MD  SPIRIVA HANDIHALER 18 MCG inhalation capsule INHALE THE CONTENTS OF 1 CAPSULE DAILY 09/24/15  Yes Mikey Kirschner, MD  tamsulosin (FLOMAX) 0.4 MG CAPS capsule Take 1 tablet by mouth at bedtime. 12/23/15  Yes [name], MD  triamcinolone (KENALOG) 0.025 % cream Apply 1 application topically 2 (two) times daily.   Yes Historical Provider, MD  Turmeric POWD by Does not apply route.   Yes Historical Provider, MD  Review of systems complete and found to be negative unless listed above in HPI     Physical exam Blood pressure 152/80, pulse 81, height '5\' 6"'$  (1.676 m), weight 209 lb (94.802 kg), SpO2 95 %. General: NAD Neck: No JVD, no thyromegaly or thyroid nodule.  Lungs: Clear to auscultation bilaterally with normal respiratory effort. CV: Nondisplaced PMI. Regular rate and rhythm, normal S1/S2, no S3/S4, no murmur.  No peripheral edema.  No carotid bruit.    Abdomen: Soft, nontender, no distention.  Skin: Intact without lesions or rashes.  Neurologic: Alert and oriented x 3.  Psych: Normal affect. Extremities: No clubbing or cyanosis.  HEENT: Normal.   ECG: Most recent ECG reviewed.  Labs:   Lab Results  Component Value Date   WBC 6.5 11/26/2014   HGB 13.6 11/26/2014   HCT 40.1 11/26/2014   MCV 92.0 11/26/2014   PLT 279 11/26/2014   No results for input(s): NA, K, CL, CO2, BUN, CREATININE, CALCIUM, PROT, BILITOT, ALKPHOS, ALT, AST, GLUCOSE in the last 168 hours.  Invalid input(s): LABALBU No results found for: CKTOTAL, CKMB, CKMBINDEX, TROPONINI  Lab Results  Component Value Date   CHOL 204* 12/05/2015   CHOL 161 04/22/2015   CHOL 145 11/26/2014   Lab Results  Component Value Date   HDL 52 12/05/2015   HDL 43 04/22/2015   HDL 37* 11/26/2014   Lab Results  Component Value Date    LDLCALC 126* 12/05/2015   LDLCALC 97 04/22/2015   LDLCALC 91 11/26/2014   Lab Results  Component Value Date   TRIG 131 12/05/2015   TRIG 106 04/22/2015   TRIG 86 11/26/2014   Lab Results  Component Value Date   CHOLHDL 3.9 12/05/2015   CHOLHDL 3.7 04/22/2015   CHOLHDL 3.9 11/26/2014   No results found for: LDLDIRECT       Studies: No results found.  ASSESSMENT AND PLAN:  1. Chest pain: Somewhat atypical characteristics for coronary artery disease. ECG was abnormal. Will obtain an echocardiogram to determine the significance of this. Has several cardiovascular risk factors. While he has had 2 previously normal stress tests in the past, I believe it is prudent to obtain a nuclear stress test. Will obtain a Lexiscan Cardiolite to evaluate for occult ischemic heart disease.  2. Essential HTN: Elevated today. Will monitor as he may need additional antihypertensive therapy titration.  3. Hyperlipidemia: Continue Lipitor. LDL 126 on 12/05/15.  Dispo: f/u 6 weeks.   Signed: Kate Sable, M.D., F.A.C.C.  01/07/2016, 9:22 AM

## 2016-01-08 DIAGNOSIS — C801 Malignant (primary) neoplasm, unspecified: Secondary | ICD-10-CM

## 2016-01-08 HISTORY — DX: Malignant (primary) neoplasm, unspecified: C80.1

## 2016-01-15 ENCOUNTER — Other Ambulatory Visit: Payer: Medicare Other

## 2016-01-16 ENCOUNTER — Other Ambulatory Visit: Payer: Self-pay

## 2016-01-16 ENCOUNTER — Ambulatory Visit (INDEPENDENT_AMBULATORY_CARE_PROVIDER_SITE_OTHER): Payer: Medicare Other

## 2016-01-16 DIAGNOSIS — R9431 Abnormal electrocardiogram [ECG] [EKG]: Secondary | ICD-10-CM

## 2016-01-16 DIAGNOSIS — R079 Chest pain, unspecified: Secondary | ICD-10-CM

## 2016-01-16 LAB — ECHOCARDIOGRAM COMPLETE
EWDT: 243 ms
FS: 40 % (ref 28–44)
IVS/LV PW RATIO, ED: 0.86
LDCA: 3.14 cm2
LEFT ATRIUM END SYS DIAM: 31 cm
LVOT peak grad rest: 5 mmHg
LVOTPV: 117 m/s
MV pk E vel: 69.4 m/s
MVPKAVEL: 97 m/s
PW: 13.9 mm — AB (ref 0.6–1.1)
Simpson's disk: 62
Stroke v: 70 ml
VTI: 22.3 cm

## 2016-01-17 ENCOUNTER — Encounter (HOSPITAL_COMMUNITY): Payer: Self-pay

## 2016-01-17 ENCOUNTER — Encounter (HOSPITAL_COMMUNITY)
Admission: RE | Admit: 2016-01-17 | Discharge: 2016-01-17 | Disposition: A | Discharge status: Home / Self Care | Payer: Medicare Other | Source: Ambulatory Visit | Attending: Cardiovascular Disease | Admitting: Cardiovascular Disease

## 2016-01-17 ENCOUNTER — Inpatient Hospital Stay (HOSPITAL_COMMUNITY): Admission: RE | Admit: 2016-01-17 | Payer: Medicare Other | Source: Ambulatory Visit

## 2016-01-17 DIAGNOSIS — R079 Chest pain, unspecified: Secondary | ICD-10-CM | POA: Insufficient documentation

## 2016-01-17 DIAGNOSIS — R9431 Abnormal electrocardiogram [ECG] [EKG]: Secondary | ICD-10-CM

## 2016-01-17 LAB — NM MYOCAR MULTI W/SPECT W/WALL MOTION / EF
CHL CUP NUCLEAR SDS: 7
CHL CUP RESTING HR STRESS: 75 {beats}/min
CSEPPHR: 106 {beats}/min
LHR: 0.87
LVDIAVOL: 69 mL (ref 62–150)
LVSYSVOL: 27 mL
NUC STRESS TID: 0.87
SRS: 6
SSS: 10

## 2016-01-17 MED ORDER — TECHNETIUM TC 99M SESTAMIBI - CARDIOLITE
30.0000 | Freq: Once | INTRAVENOUS | Status: AC | PRN
  Administered 2016-01-17: 11:00:00 30 via INTRAVENOUS

## 2016-01-17 MED ORDER — SODIUM CHLORIDE 0.9% FLUSH
INTRAVENOUS | Status: AC
  Administered 2016-01-17: 10 mL via INTRAVENOUS
  Filled 2016-01-17: qty 10

## 2016-01-17 MED ORDER — REGADENOSON 0.4 MG/5ML IV SOLN
INTRAVENOUS | Status: AC
  Administered 2016-01-17: 0.4 mg via INTRAVENOUS
  Filled 2016-01-17: qty 5

## 2016-01-17 MED ORDER — TECHNETIUM TC 99M SESTAMIBI GENERIC - CARDIOLITE
10.0000 | Freq: Once | INTRAVENOUS | Status: AC | PRN
  Administered 2016-01-17: 10 via INTRAVENOUS

## 2016-01-20 ENCOUNTER — Encounter: Payer: Self-pay | Admitting: *Deleted

## 2016-01-20 ENCOUNTER — Telehealth: Payer: Self-pay | Admitting: *Deleted

## 2016-01-20 NOTE — Telephone Encounter (Signed)
Notes Recorded by Laurine Blazer, LPN on 7/97/2820 at 60:15 AM Patient notified. Copy to pmd. Follow up already scheduled for 02/18/2016 with Dr. Bronson Ing.

## 2016-01-20 NOTE — Telephone Encounter (Signed)
-----   Message from Herminio Commons, MD sent at 01/17/2016  6:51 PM EST ----- Normal pumping function.

## 2016-01-29 ENCOUNTER — Ambulatory Visit (INDEPENDENT_AMBULATORY_CARE_PROVIDER_SITE_OTHER)
Admission: RE | Admit: 2016-01-29 | Discharge: 2016-01-29 | Disposition: A | Discharge status: Home / Self Care | Payer: Medicare Other | Source: Ambulatory Visit | Attending: Internal Medicine | Admitting: Internal Medicine

## 2016-01-29 DIAGNOSIS — R911 Solitary pulmonary nodule: Secondary | ICD-10-CM | POA: Diagnosis not present

## 2016-01-31 ENCOUNTER — Encounter (INDEPENDENT_AMBULATORY_CARE_PROVIDER_SITE_OTHER): Payer: Self-pay

## 2016-01-31 ENCOUNTER — Encounter: Payer: Self-pay | Admitting: Internal Medicine

## 2016-01-31 ENCOUNTER — Telehealth: Payer: Self-pay | Admitting: Internal Medicine

## 2016-01-31 ENCOUNTER — Ambulatory Visit (INDEPENDENT_AMBULATORY_CARE_PROVIDER_SITE_OTHER): Payer: Medicare Other | Admitting: Internal Medicine

## 2016-01-31 VITALS — BP 142/90 | HR 85 | Ht 66.0 in | Wt 207.0 lb

## 2016-01-31 DIAGNOSIS — R0602 Shortness of breath: Secondary | ICD-10-CM

## 2016-01-31 DIAGNOSIS — R911 Solitary pulmonary nodule: Secondary | ICD-10-CM | POA: Diagnosis not present

## 2016-01-31 NOTE — Patient Instructions (Signed)
ICD-9-CM ICD-10-CM   1. Shortness of breath 786.05 R06.02   2. Lung nodule 61m Left Upper Lobe Ground Glass and 441mRight Right Lower lobe - seen April 2015 and NEW problem 793.11 R91.1     #Shortness of breath - Too bad still persists - No evidence of Interstistial Lung Disease  -Pulmonary stress test suggests that this is due to you slightly being overweight and due to physical deconditioning  - PFT suggests mild emphysema  PLAN -  Continue weight loss and exercise therapy and inhaler therapy - We discussed repeat pulmonary stress test but have opted now to just follow this along    #Lung nodule 1354meft Upper Lobe Ground Glass and 4mm72mght Right Lower lobe - seen April 2015 and NEW problem.. STable Sept 2016. Slightly larger March 2017 compared to 2015 spring  - concern Is that this can be early stage stage I lung cancer non-small cell - I recommend that he undergo a test called Percepta brushing of the airway - I also recommend that you might need to undergo a test called Electra navigational bronchoscopy with biopsy by Dr. RobeBaltazar Apohe combination or either of these 2 tests can help us [place] closer to the truth if this is early stage lung cancer or not - If this is early stage lung cancer then surgical resection should give you cure - Other alternative is to continue to follow this along with CT scan chest every 6 months but I less favor this method right now  #Followup - Please call us [phone] week with the decision - Otherwise return to see me in 3 months

## 2016-01-31 NOTE — Telephone Encounter (Signed)
Spoke with Lattie Haw and [facility] D She will send this to [employer] with the pt  He states that he has discussed procedures with his spouse and decided on having them done  I advised will let MR know that then someone will call him to schedule

## 2016-01-31 NOTE — Telephone Encounter (Signed)
Pt calling stating that he is returning call.Miguel Morales

## 2016-01-31 NOTE — Telephone Encounter (Signed)
Pt calling wanting to go ahead and sched procedures that were discussed on today.Hillery Hunter

## 2016-01-31 NOTE — Progress Notes (Signed)
Subjective:    Patient ID: Miguel Morales, male    DOB: 1946/04/09, 70 y.o.   MRN: [id number]  HPI    OV 08/01/2015  Chief Complaint  Patient presents with  . Follow-up    Pt states his breathing is no better from last OV in 12/2014. Pt states he can detect a change in his voice- hoarseness. Pt c/o mild dry cough and upper mid back pain. Pt denies chest congestion and CP/tightness.      Lung nodule LUL 54m and 486mRLLL  -both new April 2015. We worked this up further with PET scan 05/11/10/5; indeterminate low prob. And with INDI Dx blood tests indiDx 05/31/14 puts probability of lung nodule being benign at 90%. Had CT chest now 14 2016 and the groundglass opacities unchanged at left upper lobe but reported at 15 mm the radiologist says is unchanged. He is only inclined to follow it given the stability over 18 months. I personally visualized the film   Dyspnea that is multifactorial and poorly explain but being treated as COPD: This is stable but persists and impairs his quality-of-life. He is on inhaler therapy. This no worsening of flare up. He will have a flu shot he is now seeing an allergist Dr. [facility]Shelah [facility]owskyt [facility]   [place]  Chief Complaint  Patient presents with  . Follow-up    Pt states his breathing has worsened since last OV in 07/2015. Pt states he becomes more SOB with less activity, pt c/o cough with little mucus production. Pt denies CP/tightness. Pt using CPAP nightly.    Follow-up dyspnea: This continues to persist. In fact it is worse. He does daily walks 3 times a week and slower now. This was associated chest pain or new medical problems. He says he is attended pulmonary rehabilitation in the past and it only improved a little bit. Now it has deteriorated. Nevertheless he does not want to attend pulmonary habitation again. He is not willing to undergo repeat pulmonary stress testing. CT scan chest 2 days ago does not show any interstitial lung disease  or any new problems. He just wants [place] follow this expectantly  Left upper lobe lung nodule: Seems slightly grown since 2015. It is more of a groundglass opacity.  reports that he quit smoking about 12 years ago. His smoking use included Cigarettes. He has a 30 pack-year smoking history. He has never used smokeless tobacco.    Ct Chest Wo Contrast  01/29/2016  CLINICAL DATA:  Followup pulmonary nodules. EXAM: CT CHEST WITHOUT CONTRAST TECHNIQUE: Multidetector CT imaging of the chest was performed following the standard protocol without IV contrast. COMPARISON:  Multiple prior chest CTs. The most recent is 07/24/2015 FINDINGS: Mediastinum/Nodes: No chest wall mass, supraclavicular or axillary lymphadenopathy. The heart is normal in size. No pericardial effusion. Dense 3 vessel coronary artery calcifications noted. There is mild tortuosity, ectasia and calcification the thoracic aorta which is stable. Small scattered mediastinal and hilar lymph nodes but no mass or adenopathy. The esophagus is normal. Lungs/Pleura: Stable emphysematous changes. No acute pulmonary findings. There is a stable left upper lobe ground-glass opacity on image number 28 which measures 14.5 x 11.5 mm. This is unchanged since the most recent chest CT. It is slightly larger that was in March 2015. Recommend continued surveillance. No new pulmonary lesions. No pleural effusion. No bronchiectasis or interstitial lung disease. Upper abdomen: No significant findings. Musculoskeletal: No significant findings. Stable advanced degenerative changes involving the thoracic  spine. IMPRESSION: 1. Stable left upper lobe ground-glass nodule measuring 14.5 x 11.5 mm. This has increased slightly in size since the regional chest CT in March 2015. Recommend repeat noncontrast chest CT in 2 years. (CT is recommended every 2 years until 5 years of stability has been established. This recommendation follows the consensus statement: Guidelines for Management  of Incidental Pulmonary Nodules Detected on CT Images:From the Fleischner Society 2017; published online before print (10.1148/radiol.9417408144)). 2. Stable emphysematous changes. No acute pulmonary findings. No new pulmonary lesions. 3. Stable atherosclerotic calcifications involving the aorta and branch vessels including the Coronary arteries. Electronically Signed   By: Marijo Sanes M.D.   On: 01/29/2016 13:38       Review of Systems       Objective:   Physical Exam  Filed Vitals:   01/31/16 1331  BP: 142/90  Pulse: 85  Height: '5\' 6"'$  (1.676 m)  Weight: 207 lb (93.895 kg)  SpO2: 96%         Assessment & Plan:     ICD-9-CM ICD-10-CM   1. Shortness of breath 786.05 R06.02   2. Lung nodule 77m Left Upper Lobe Ground Glass and 474mRight Right Lower lobe - seen April 2015 and NEW problem 793.11 R91.1     #Shortness of breath - Too bad still persists - No evidence of Interstistial Lung Disease  -Pulmonary stress test suggests that this is due to you slightly being overweight and due to physical deconditioning  - PFT suggests mild emphysema  PLAN -  Continue weight loss and exercise therapy and inhaler therapy - We discussed repeat pulmonary stress test but have opted now to just follow this along    #Lung nodule 1364meft Upper Lobe Ground Glass and 4mm67mght Right Lower lobe - seen April 2015 and NEW problem.. STable Sept 2016. Slightly larger March 2017 compared to 2015 spring  - concern Is that this can be early stage stage I lung cancer non-small cell - I recommend that he undergo a test called Percepta brushing of the airway - I also recommend that you might need to undergo a test called Electra navigational bronchoscopy with biopsy by Dr. RobeBaltazar Apohe combination or either of these 2 tests can help us [place] closer to the truth if this is early stage lung cancer or not - If this is early stage lung cancer then surgical resection should give you cure - Other  alternative is to continue to follow this along with CT scan chest every 6 months but I less favor this method right now  #Followup - Please call us [phone] week with the decision - Otherwise return to see me in 3 months  (> 50% of this 15 min visit spent in face to face counseling or/and coordination of care)   Dr. MuraBrand MalesD., F.C.[facility] Pulmonary and Critical Care Medicine Staff Physician [facility] Pager: [phone] no answer or between  15:00h - 7:00h: call [phone]  01/31/2016 1:56 PM

## 2016-01-31 NOTE — Telephone Encounter (Signed)
[name]  pls get me super do of his 01/29/16 ct chest and leave on my desk  Dr. Brand Males, M.D., [facility].C.P Pulmonary and Critical Care Medicine Staff Physician [place] Pulmonary and Critical Care Pager: [phone], If no answer or between  15:00h - 7:00h: call [phone]  01/31/2016 2:00 PM

## 2016-02-03 ENCOUNTER — Telehealth: Payer: Self-pay | Admitting: Internal Medicine

## 2016-02-03 DIAGNOSIS — R911 Solitary pulmonary nodule: Secondary | ICD-10-CM

## 2016-02-03 NOTE — Telephone Encounter (Signed)
Pt aware of rec's per MR Will send back to MR per his request

## 2016-02-03 NOTE — Telephone Encounter (Signed)
Pls let TYQUON NEAR know that I am running CT by Dr Sallee Provencal and will get back. It could be a week . IF he does not hear in a week he is to call back. Pls send message back to me so I can keep track

## 2016-02-03 NOTE — Telephone Encounter (Signed)
Rob  THis genetleman KAEGAN HETTICH has a slow growing, nodule like a GGO with low grade PET uptake. Radiology advised serial CT but is > 1 cm and he has risk factors and is growing. I have asked for SUper D to be made. Could you please take a look and assess for Percepta +/- ENB. He is willing to undergo either or both for pre-test prob adjustment and dx  He is of low risk so we could schedule him direct to OR if you feel you can get to [place]. If you cannot do ENB can you do just percepta?  Thanks  Dr. Brand Males, M.D., [facility].C.P Pulmonary and Critical Care Medicine Staff Physician [place] Pulmonary and Critical Care Pager: [phone], If no answer or between  15:00h - 7:00h: call [phone]  02/03/2016 4:40 AM

## 2016-02-05 NOTE — Telephone Encounter (Signed)
Dr Lamonte Sakai got back to me 02/05/2016 and he want to see super-d first. Please get it to him ASAP. Let me know once in his hands  THanks  Dr. Brand Males, M.D., [name].C.P Pulmonary and Critical Care Medicine Staff Physician [place] Pulmonary and Critical Care Pager: [phone], If no answer or between  15:00h - 7:00h: call [phone]  02/05/2016 2:48 PM

## 2016-02-05 NOTE — Telephone Encounter (Signed)
Disc has been received and placed in RB's look at. I will give this to him tomorrow.

## 2016-02-05 NOTE — Telephone Encounter (Signed)
MR - be glad to eval. If you have the disk then get it to me and I will look to see if a viable p[athway to the lesion.  Thanks, RB

## 2016-02-07 NOTE — Telephone Encounter (Signed)
Called and spoke to pt. Informed him that MR and RB are discussing the case and will get back to him with a possible OV.   MR please advise if pt needs OV with RB. Super D disc was received and given to [place] per Ria Comment on 3.30.17 (see phone note from 3.24.17)

## 2016-02-07 NOTE — Telephone Encounter (Signed)
Pt calling back about this said that he was told to call back if he hadn't heard anything by 12 or one, please advise.Miguel Morales

## 2016-02-10 ENCOUNTER — Telehealth: Payer: Self-pay | Admitting: Internal Medicine

## 2016-02-10 NOTE — Telephone Encounter (Signed)
Wrong note. He Miguel Morales is wating for ENB decision from DR [facility]

## 2016-02-10 NOTE — Telephone Encounter (Deleted)
Judson Roch  She has end stage copd and RUL nodule very suspiciuons for cancer. She is having PET 02/17/16 and I have asked for you to see patient after that. She is high risk for lobectomy . She is good for XRT if PET suggests stage 1A NSCLC but radiation always wants biopsy . So if PET shows early stage lung caner 0- you can consider CT guided TTNA - this lesion is in periophery but I prefer onc-immune and if positive go for empiric XRT. I have done this in past and radiaitioon dept have agreed  Please get oncimmune paper work for this test. Daneil Dan is copied. I am out till 4/16  THanks  Dr. Brand Males, M.D., [facility].C.P Pulmonary and Critical Care Medicine Staff Physician [place] Pulmonary and Critical Care Pager: [phone], If no answer or between  15:00h - 7:00h: call [phone]  02/10/2016 3:05 PM

## 2016-02-11 NOTE — Telephone Encounter (Signed)
Robert  Please consider PERCEPTA given indeterminate prob of the nodule  Thanks  Dr. Brand Males, M.D., [facility].C.P Pulmonary and Critical Care Medicine Staff Physician [place] Pulmonary and Critical Care Pager: [phone], If no answer or between  15:00h - 7:00h: call [phone]  02/11/2016 11:14 PM

## 2016-02-11 NOTE — Telephone Encounter (Signed)
Please let the pt know that I reviewed his scan, that he would be a good candidate for navigational bronchoscopy to biopsy his LUL nodule. If he is willing to proceed with this please place order with PCC's. I already have the disk. Thanks.

## 2016-02-13 NOTE — Telephone Encounter (Signed)
Per [place], she called pt.  Will send msg to her.

## 2016-02-13 NOTE — Telephone Encounter (Signed)
Pt returning call.Miguel Morales ° °

## 2016-02-13 NOTE — Telephone Encounter (Signed)
enb scheduled 02/26/16'@8'$ :30am pt and dr byrum are aware Miguel Morales

## 2016-02-13 NOTE — Telephone Encounter (Signed)
Spoke with pt and he agrees to navigational bronch procedure. Order placed. Nothing further needed.

## 2016-02-18 ENCOUNTER — Telehealth: Payer: Self-pay | Admitting: Emergency Medicine

## 2016-02-18 ENCOUNTER — Ambulatory Visit (INDEPENDENT_AMBULATORY_CARE_PROVIDER_SITE_OTHER): Payer: Medicare Other | Admitting: Cardiovascular Disease

## 2016-02-18 ENCOUNTER — Encounter: Payer: Self-pay | Admitting: Cardiovascular Disease

## 2016-02-18 VITALS — BP 109/74 | HR 84 | Ht 66.0 in | Wt 206.0 lb

## 2016-02-18 DIAGNOSIS — E785 Hyperlipidemia, unspecified: Secondary | ICD-10-CM

## 2016-02-18 DIAGNOSIS — I10 Essential (primary) hypertension: Secondary | ICD-10-CM

## 2016-02-18 DIAGNOSIS — R079 Chest pain, unspecified: Secondary | ICD-10-CM

## 2016-02-18 NOTE — Progress Notes (Signed)
I paged Dr Brock Ra to request order for surgery.  I also called the  Office and asked for orders to entered.

## 2016-02-18 NOTE — Progress Notes (Signed)
Patient ID: Miguel Morales, male   DOB: Jul 06, 1946, 70 y.o.   MRN: [id number]      SUBJECTIVE: The patient returns for follow-up after undergoing cardiovascular testing performed for the evaluation of chest pain. Nuclear stress test on 01/17/16 was low risk with no evidence of ischemia or scar. Echocardiogram demonstrated vigorous left ventricular systolic function, LVEF 66-06%, mild LVH, and grade 1 diastolic dysfunction. A bicuspid aortic valve could not entirely be excluded but short axis images were off axis and there was no significant aortic regurgitation. Going for biopsy of lung nodule next week.   Review of Systems: As per "subjective", otherwise negative.  Allergies  Allergen Reactions  . Sulfa Antibiotics     Current Outpatient Prescriptions  Medication Sig Dispense Refill  . acetaminophen (TYLENOL) 500 MG tablet Take 500 mg by mouth every 6 (six) hours as needed.    Marland Kitchen albuterol (PROVENTIL HFA;VENTOLIN HFA) 108 (90 Base) MCG/ACT inhaler Inhale 2 puffs into the lungs every 6 (six) hours as needed for wheezing. 3 Inhaler 2  . amLODipine (NORVASC) 10 MG tablet Take 1 tablet (10 mg total) by mouth daily. 90 tablet 1  . Ascorbic Acid (VITAMIN C) 1000 MG tablet Take 1,000 mg by mouth daily.    Marland Kitchen aspirin 81 MG tablet Take 81 mg by mouth daily.    Marland Kitchen atorvastatin (LIPITOR) 40 MG tablet Take 1 tablet (40 mg total) by mouth daily. 90 tablet 1  . budesonide-formoterol (SYMBICORT) 160-4.5 MCG/ACT inhaler Inhale 2 puffs into the lungs 2 (two) times daily.    . Coenzyme Q10 (COQ10) 400 MG CAPS Take by mouth daily.    . Flaxseed, Linseed, (FLAXSEED OIL) 1000 MG CAPS Take 1 capsule by mouth daily.    . fluticasone (FLONASE) 50 MCG/ACT nasal spray USE 1 SPRAY IN EACH NOSTRIL DAILY AS NEEDED 16 g 5  . ibuprofen (ADVIL,MOTRIN) 200 MG tablet Take 400 mg by mouth 2 (two) times daily as needed.    Marland Kitchen ketotifen (ZADITOR) 0.025 % ophthalmic solution Place 1 drop into both eyes 2 (two) times daily as  needed.    . lidocaine (XYLOCAINE) 5 % ointment Apply 1 application topically as needed.    . Loratadine 10 MG CAPS Take by mouth daily. PRN    . mometasone (ELOCON) 0.1 % lotion APPLY TOPICALLY DAILY 60 mL 3  . omeprazole (PRILOSEC) 20 MG capsule TAKE 1 CAPSULE DAILY AS NEEDED 90 capsule 1  . SPIRIVA HANDIHALER 18 MCG inhalation capsule INHALE THE CONTENTS OF 1 CAPSULE DAILY 90 capsule 1  . tamsulosin (FLOMAX) 0.4 MG CAPS capsule Take 1 tablet by mouth at bedtime. 90 capsule 1  . triamcinolone (KENALOG) 0.025 % cream Apply 1 application topically 2 (two) times daily.    . Turmeric POWD by Does not apply route.     No current facility-administered medications for this visit.    Past Medical History  Diagnosis Date  . COPD (chronic obstructive pulmonary disease) ([place])   . Mixed hyperlipidemia   . GERD (gastroesophageal reflux disease)   . Cancer ([place])   . Asthma     Past Surgical History  Procedure Laterality Date  . Knee arthroscopy    . Septoplasty    . Circumcision    . Colonoscopy  03/04/2004    TKZ:SWFU-XNATF diverticula.  The remainder of the colonic mucosa appeared normal/normal rectum  . Colonoscopy  05/22/2009    TDD:UKGURKY mucosa appeared normal/Left-sided diverticula/Normal rectum  . Colonoscopy N/A 08/02/2014    Procedure: COLONOSCOPY;  Surgeon: Daneil Dolin, MD;  Location: [facility];  Service: Endoscopy;  Laterality: N/A;  2:00 PM    Social History   Social History  . Marital Status: Married    Spouse Name: N/A  . Number of Children: N/A  . Years of Education: N/A   Occupational History  . Not on file.   Social History Main Topics  . Smoking status: Former Smoker -- 1.00 packs/day for 30 years    Types: Cigarettes    Start date: 11/09/1958    Quit date: 11/10/2003  . Smokeless tobacco: Never Used  . Alcohol Use: 0.0 oz/week    0 Standard drinks or equivalent per week     Comment: Occasionally  . Drug Use: No  . Sexual Activity: Not on file    Other Topics Concern  . Not on file   Social History Narrative     Filed Vitals:   02/18/16 0857  BP: 109/74  Pulse: 84  Height: '5\' 6"'$  (1.676 m)  Weight: 206 lb (93.441 kg)    PHYSICAL EXAM General: NAD HEENT: Normal. Neck: No JVD, no thyromegaly. Lungs: Clear to auscultation bilaterally with normal respiratory effort. CV: Nondisplaced PMI.  Regular rate and rhythm, normal S1/S2, no S3/S4, no murmur. No pretibial or periankle edema.  Abdomen: Soft, nontender, no distention.  Neurologic: Alert and oriented.  Psych: Normal affect. Skin: Normal. Musculoskeletal: No gross deformities.  ECG: Most recent ECG reviewed.      ASSESSMENT AND PLAN: 1. Chest pain: Symptoms previously described were somewhat atypical characteristics for coronary artery disease. ECG was abnormal. Lexiscan Cardiolite stress test effectively ruled out ischemic heart disease. No further testing indicated. Can continue ASA and statin for primary prevention.  2. Essential HTN: Controlled. No changes.  3. Hyperlipidemia: Continue Lipitor. LDL 126 on 12/05/15.  Dispo: f/u prn.   Kate Sable, M.D., F.A.C.C.

## 2016-02-18 NOTE — Patient Instructions (Signed)
Continue all current medications. Follow up as needed  

## 2016-02-18 NOTE — Telephone Encounter (Signed)
LM for Miguel Morales @ [facility] x 1

## 2016-02-19 NOTE — Telephone Encounter (Signed)
Done 4/12

## 2016-02-19 NOTE — Telephone Encounter (Signed)
Dr. Lamonte Sakai, received call from Opal Sidles at [facility] stating she needs orders for this patient.

## 2016-02-20 ENCOUNTER — Encounter (HOSPITAL_COMMUNITY): Payer: Self-pay

## 2016-02-20 ENCOUNTER — Encounter (HOSPITAL_COMMUNITY)
Admission: RE | Admit: 2016-02-20 | Discharge: 2016-02-20 | Disposition: A | Discharge status: Home / Self Care | Payer: Medicare Other | Source: Ambulatory Visit | Attending: Emergency Medicine | Admitting: Emergency Medicine

## 2016-02-20 DIAGNOSIS — E785 Hyperlipidemia, unspecified: Secondary | ICD-10-CM | POA: Insufficient documentation

## 2016-02-20 DIAGNOSIS — Z79899 Other long term (current) drug therapy: Secondary | ICD-10-CM | POA: Insufficient documentation

## 2016-02-20 DIAGNOSIS — Z01812 Encounter for preprocedural laboratory examination: Secondary | ICD-10-CM | POA: Insufficient documentation

## 2016-02-20 DIAGNOSIS — Z7982 Long term (current) use of aspirin: Secondary | ICD-10-CM | POA: Diagnosis not present

## 2016-02-20 DIAGNOSIS — Z01818 Encounter for other preprocedural examination: Secondary | ICD-10-CM | POA: Insufficient documentation

## 2016-02-20 DIAGNOSIS — R918 Other nonspecific abnormal finding of lung field: Secondary | ICD-10-CM | POA: Insufficient documentation

## 2016-02-20 DIAGNOSIS — K219 Gastro-esophageal reflux disease without esophagitis: Secondary | ICD-10-CM | POA: Diagnosis not present

## 2016-02-20 DIAGNOSIS — J449 Chronic obstructive pulmonary disease, unspecified: Secondary | ICD-10-CM | POA: Insufficient documentation

## 2016-02-20 DIAGNOSIS — Z87891 Personal history of nicotine dependence: Secondary | ICD-10-CM | POA: Insufficient documentation

## 2016-02-20 DIAGNOSIS — G4733 Obstructive sleep apnea (adult) (pediatric): Secondary | ICD-10-CM | POA: Insufficient documentation

## 2016-02-20 DIAGNOSIS — I10 Essential (primary) hypertension: Secondary | ICD-10-CM | POA: Insufficient documentation

## 2016-02-20 DIAGNOSIS — Z9889 Other specified postprocedural states: Secondary | ICD-10-CM

## 2016-02-20 HISTORY — DX: Unspecified osteoarthritis, unspecified site: M19.90

## 2016-02-20 HISTORY — DX: Nocturia: R35.1

## 2016-02-20 HISTORY — DX: Sleep apnea, unspecified: G47.30

## 2016-02-20 HISTORY — DX: Reserved for inherently not codable concepts without codable children: IMO0001

## 2016-02-20 HISTORY — DX: Spinal stenosis, site unspecified: M48.00

## 2016-02-20 HISTORY — DX: Essential (primary) hypertension: I10

## 2016-02-20 HISTORY — DX: Personal history of other diseases of the respiratory system: Z87.09

## 2016-02-20 LAB — PROTIME-INR
INR: 1.05 (ref 0.00–1.49)
Prothrombin Time: 13.9 seconds (ref 11.6–15.2)

## 2016-02-20 LAB — COMPREHENSIVE METABOLIC PANEL
ALK PHOS: 75 U/L (ref 38–126)
ALT: 19 U/L (ref 17–63)
ANION GAP: 13 (ref 5–15)
AST: 24 U/L (ref 15–41)
Albumin: 3.9 g/dL (ref 3.5–5.0)
BILIRUBIN TOTAL: 0.8 mg/dL (ref 0.3–1.2)
BUN: 19 mg/dL (ref 6–20)
CALCIUM: 9 mg/dL (ref 8.9–10.3)
CO2: 24 mmol/L (ref 22–32)
Chloride: 107 mmol/L (ref 101–111)
Creatinine, Ser: 1.13 mg/dL (ref 0.61–1.24)
GFR calc non Af Amer: >60 mL/min (ref >60)
Glucose, Bld: 144 mg/dL — ABNORMAL HIGH (ref 65–99)
Potassium: 3.6 mmol/L (ref 3.5–5.1)
Sodium: 144 mmol/L (ref 135–145)
TOTAL PROTEIN: 6.8 g/dL (ref 6.5–8.1)

## 2016-02-20 LAB — CBC
HEMATOCRIT: 42.6 % (ref 39.0–52.0)
HEMOGLOBIN: 14.1 g/dL (ref 13.0–17.0)
MCH: 31 pg (ref 26.0–34.0)
MCHC: 33.1 g/dL (ref 30.0–36.0)
MCV: 93.6 fL (ref 78.0–100.0)
Platelets: 265 10*3/uL (ref 150–400)
RBC: 4.55 MIL/uL (ref 4.22–5.81)
RDW: 13.4 % (ref 11.5–15.5)
WBC: 7.5 10*3/uL (ref 4.0–10.5)

## 2016-02-20 LAB — APTT: aPTT: 29 seconds (ref 24–37)

## 2016-02-20 NOTE — Pre-Procedure Instructions (Signed)
    Miguel Morales  02/20/2016      [facility], [address] [place] [address] Phone: [phone] Fax: [phone]  [facility] [place], [place] [address] [place] [address] Phone: [phone] Fax: [place], [place] [address] [place] [address] Phone: [phone] Fax: [phone]    Your procedure is scheduled on Wednesday, April 19th, 2017.  Report to [facility] Admitting at 6:30 A.M.   Call this number if you have problems the morning of surgery:  [phone]   Remember:  Do not eat food or drink liquids after midnight.   Take these medicines the morning of surgery with A SIP OF WATER: Acetaminophen (Tylenol) if needed, Albuterol inhaler if needed (please bring with you), Amlodipine (Norvasc), Symbicort inhaler, Fluticasone (Flonase) if needed, Loratadine, Omeprazole (Prilosec), Spiriva inhaler.  Stop taking: Aspirin, NSAIDS, Aleve, Naproxen, Ibuprofen, Advil, Motrin, BC's, Goody's, Fish oil, all herbal medications, and all vitamins.    Do not wear jewelry.  Do not wear lotions, powders, or colognes.    Men may shave face and neck.  Do not bring valuables to the hospital.   The [facility] is not responsible for any belongings or valuables.  Contacts, dentures or bridgework may not be worn into surgery.  Leave your suitcase in the car.  After surgery it may be brought to your room.  For patients admitted to the hospital, discharge time will be determined by your treatment team.  Patients discharged the day of surgery will not be allowed to drive home.   Special instructions:  See attached.   Please read over the following fact sheets that you were given. Pain Booklet, Coughing and Deep Breathing and Surgical Site Infection Prevention

## 2016-02-20 NOTE — Progress Notes (Signed)
PCP - Dr. Sallee Lange Cardiologist - Dr. Domenic Polite; Dr. Bronson Ing  Pulmonologist - Dr. Chase Caller  EKG - 12/2015 CXR - 02/20/16  Echo-01/2016 Stress test - 12/2015 Cardiac cath - denies  Patient denies chest pain and shortness of breath at PAT appointment.

## 2016-02-20 NOTE — Progress Notes (Signed)
Anesthesia Chart Review:  Pt is a 70 year old male scheduled for video bronchoscopy with endobronchial navigation L upper mass on 02/26/2016 with Dr. Lamonte Sakai.   PCP is Dr. Sallee Lange. Was seen by Dr. Bronson Ing for chest pain recently, stress and echo results below; follow up prn recommended.   PMH includes:  HTN, hyperlipidemia, COPD, asthma, OSA, GERD. Former smoker. BMI 34  Medications include: albuterol, amlodipine, ASA, lipitor, symbicort, prilosec  Preoperative labs reviewed.    CT chest 01/29/16:  1. Stable left upper lobe ground-glass nodule measuring 14.5 x 11.5 mm. This has increased slightly in size since the regional chest CT in March 2015.  2. Stable emphysematous changes. No acute pulmonary findings. No new pulmonary lesions. 3. Stable atherosclerotic calcifications involving the aorta and branch vessels including the Coronary arteries.  EKG 12/23/15: sinus rhythm. Leftward axis. Anteroseptal infarct, age undetermined.   Nuclear stress test 01/17/16:   Defect 1: There is a medium defect of mild severity present in the mid inferior and apical inferior location. This is due to soft tissue attenuation artifact.  This is a low risk study.  Nuclear stress EF: 61%.  There was no ST segment deviation noted during stress.  Echo 01/16/16:  - Left ventricle: The cavity size was normal. Wall thickness was increased in a pattern of mild LVH. Systolic function was vigorous. The estimated ejection fraction was in the range of 65% to 70%. Wall motion was normal; there were no regional wall motion abnormalities. Doppler parameters are consistent with abnormal left ventricular relaxation (grade 1 diastolic dysfunction). - Aortic valve: A bicuspid morphology cannot be excluded. There was no significant regurgitation. - Mitral valve: There was trivial regurgitation. - Atrial septum: No defect or patent foramen ovale was identified. - Tricuspid valve: There was physiologic regurgitation. -  Pulmonary arteries: Systolic pressure could not be accurately estimated.  If no changes, I anticipate pt can proceed with surgery as scheduled.   Willeen Cass, FNP-BC [facility]/Anesthesiology Phone: [phone] 02/20/2016 4:29 PM

## 2016-02-20 NOTE — Progress Notes (Signed)
Per previous electronic order, patient will need portable chest xray post bronchoscopy.  Order was originally released during PAT appointment.

## 2016-02-26 ENCOUNTER — Encounter (HOSPITAL_COMMUNITY): Payer: Self-pay | Admitting: *Deleted

## 2016-02-26 ENCOUNTER — Ambulatory Visit (HOSPITAL_COMMUNITY): Payer: Medicare Other

## 2016-02-26 ENCOUNTER — Encounter (HOSPITAL_COMMUNITY)
Admission: RE | Disposition: A | Discharge status: Home / Self Care | Payer: Self-pay | Source: Ambulatory Visit | Attending: Emergency Medicine

## 2016-02-26 ENCOUNTER — Ambulatory Visit (HOSPITAL_COMMUNITY): Payer: Medicare Other | Admitting: Emergency Medicine

## 2016-02-26 ENCOUNTER — Ambulatory Visit (HOSPITAL_COMMUNITY)
Admission: RE | Admit: 2016-02-26 | Discharge: 2016-02-26 | Disposition: A | Discharge status: Home / Self Care | Payer: Medicare Other | Source: Ambulatory Visit | Attending: Emergency Medicine | Admitting: Emergency Medicine

## 2016-02-26 ENCOUNTER — Ambulatory Visit (HOSPITAL_COMMUNITY): Payer: Medicare Other | Admitting: Anesthesiology

## 2016-02-26 ENCOUNTER — Ambulatory Visit (INDEPENDENT_AMBULATORY_CARE_PROVIDER_SITE_OTHER): Payer: Medicare Other | Admitting: Emergency Medicine

## 2016-02-26 DIAGNOSIS — R911 Solitary pulmonary nodule: Secondary | ICD-10-CM | POA: Diagnosis not present

## 2016-02-26 DIAGNOSIS — J449 Chronic obstructive pulmonary disease, unspecified: Secondary | ICD-10-CM | POA: Insufficient documentation

## 2016-02-26 DIAGNOSIS — G4730 Sleep apnea, unspecified: Secondary | ICD-10-CM | POA: Insufficient documentation

## 2016-02-26 DIAGNOSIS — C3412 Malignant neoplasm of upper lobe, left bronchus or lung: Secondary | ICD-10-CM | POA: Diagnosis not present

## 2016-02-26 DIAGNOSIS — I10 Essential (primary) hypertension: Secondary | ICD-10-CM | POA: Insufficient documentation

## 2016-02-26 DIAGNOSIS — K219 Gastro-esophageal reflux disease without esophagitis: Secondary | ICD-10-CM | POA: Diagnosis not present

## 2016-02-26 DIAGNOSIS — Z87891 Personal history of nicotine dependence: Secondary | ICD-10-CM | POA: Insufficient documentation

## 2016-02-26 DIAGNOSIS — Z9889 Other specified postprocedural states: Secondary | ICD-10-CM

## 2016-02-26 DIAGNOSIS — Z419 Encounter for procedure for purposes other than remedying health state, unspecified: Secondary | ICD-10-CM

## 2016-02-26 DIAGNOSIS — R918 Other nonspecific abnormal finding of lung field: Secondary | ICD-10-CM | POA: Diagnosis not present

## 2016-02-26 DIAGNOSIS — Z006 Encounter for examination for normal comparison and control in clinical research program: Secondary | ICD-10-CM

## 2016-02-26 HISTORY — PX: VIDEO BRONCHOSCOPY WITH ENDOBRONCHIAL NAVIGATION: SHX6175

## 2016-02-26 SURGERY — VIDEO BRONCHOSCOPY WITH ENDOBRONCHIAL NAVIGATION
Anesthesia: General

## 2016-02-26 MED ORDER — ROCURONIUM BROMIDE 50 MG/5ML IV SOLN
INTRAVENOUS | Status: AC
  Filled 2016-02-26: qty 1

## 2016-02-26 MED ORDER — 0.9 % SODIUM CHLORIDE (POUR BTL) OPTIME
TOPICAL | Status: DC | PRN
  Administered 2016-02-26: 1000 mL

## 2016-02-26 MED ORDER — VECURONIUM BROMIDE 10 MG IV SOLR
INTRAVENOUS | Status: AC
  Filled 2016-02-26: qty 10

## 2016-02-26 MED ORDER — STERILE WATER FOR INJECTION IJ SOLN
INTRAMUSCULAR | Status: AC
  Filled 2016-02-26: qty 10

## 2016-02-26 MED ORDER — PROPOFOL 10 MG/ML IV BOLUS
INTRAVENOUS | Status: DC | PRN
  Administered 2016-02-26: 140 mg via INTRAVENOUS

## 2016-02-26 MED ORDER — ROCURONIUM BROMIDE 100 MG/10ML IV SOLN
INTRAVENOUS | Status: DC | PRN
  Administered 2016-02-26: 50 mg via INTRAVENOUS

## 2016-02-26 MED ORDER — FENTANYL CITRATE (PF) 100 MCG/2ML IJ SOLN
INTRAMUSCULAR | Status: DC | PRN
  Administered 2016-02-26: 100 ug via INTRAVENOUS

## 2016-02-26 MED ORDER — FENTANYL CITRATE (PF) 100 MCG/2ML IJ SOLN: 25.0000 ug | INTRAMUSCULAR | Status: DC | PRN

## 2016-02-26 MED ORDER — PHENYLEPHRINE HCL 10 MG/ML IJ SOLN
10.0000 mg | INTRAVENOUS | Status: DC | PRN
  Administered 2016-02-26: 20 ug/min via INTRAVENOUS

## 2016-02-26 MED ORDER — VECURONIUM BROMIDE 10 MG IV SOLR
INTRAVENOUS | Status: DC | PRN
  Administered 2016-02-26 (×2): 1 mg via INTRAVENOUS
  Administered 2016-02-26: 2 mg via INTRAVENOUS

## 2016-02-26 MED ORDER — PHENYLEPHRINE HCL 10 MG/ML IJ SOLN
INTRAMUSCULAR | Status: DC | PRN
  Administered 2016-02-26: 80 ug via INTRAVENOUS
  Administered 2016-02-26: 40 ug via INTRAVENOUS

## 2016-02-26 MED ORDER — LIDOCAINE HCL (CARDIAC) 20 MG/ML IV SOLN
INTRAVENOUS | Status: DC | PRN
  Administered 2016-02-26: 70 mg via INTRAVENOUS

## 2016-02-26 MED ORDER — SUGAMMADEX SODIUM 200 MG/2ML IV SOLN
INTRAVENOUS | Status: AC
  Filled 2016-02-26: qty 2

## 2016-02-26 MED ORDER — FENTANYL CITRATE (PF) 250 MCG/5ML IJ SOLN
INTRAMUSCULAR | Status: AC
  Filled 2016-02-26: qty 5

## 2016-02-26 MED ORDER — LACTATED RINGERS IV SOLN
INTRAVENOUS | Status: DC | PRN
  Administered 2016-02-26: 08:00:00 via INTRAVENOUS

## 2016-02-26 MED ORDER — SUGAMMADEX SODIUM 200 MG/2ML IV SOLN
INTRAVENOUS | Status: DC | PRN
  Administered 2016-02-26: 200 mg via INTRAVENOUS

## 2016-02-26 MED ORDER — ONDANSETRON HCL 4 MG/2ML IJ SOLN
INTRAMUSCULAR | Status: AC
  Filled 2016-02-26: qty 2

## 2016-02-26 MED ORDER — MIDAZOLAM HCL 2 MG/2ML IJ SOLN
INTRAMUSCULAR | Status: AC
  Filled 2016-02-26: qty 2

## 2016-02-26 MED ORDER — ONDANSETRON HCL 4 MG/2ML IJ SOLN
INTRAMUSCULAR | Status: DC | PRN
  Administered 2016-02-26: 4 mg via INTRAVENOUS

## 2016-02-26 MED ORDER — PROPOFOL 10 MG/ML IV BOLUS
INTRAVENOUS | Status: AC
  Filled 2016-02-26: qty 20

## 2016-02-26 SURGICAL SUPPLY — 44 items
ADAPTER BRONCH F/PENTAX (ADAPTER) ×3 IMPLANT
ADPR BSCP EDG PNTX (ADAPTER) ×1
BRUSH BIOPSY BRONCH 10 SDTNB (MISCELLANEOUS) ×1 IMPLANT
BRUSH BIOPSY BRONCH 10MM SDTNB (MISCELLANEOUS) ×1
BRUSH CYTOL CELLEBRITY 1.5X140 (MISCELLANEOUS) ×5 IMPLANT
BRUSH SUPERTRAX BIOPSY (INSTRUMENTS) ×2 IMPLANT
BRUSH SUPERTRAX NDL-TIP CYTO (INSTRUMENTS) ×2 IMPLANT
CANISTER SUCTION 2500CC (MISCELLANEOUS) ×3 IMPLANT
CHANNEL WORK EXTEND EDGE 180 (KITS) IMPLANT
CHANNEL WORK EXTEND EDGE 45 (KITS) IMPLANT
CHANNEL WORK EXTEND EDGE 90 (KITS) IMPLANT
CONT SPEC 4OZ CLIKSEAL STRL BL (MISCELLANEOUS) ×3 IMPLANT
COVER TABLE BACK 60X90 (DRAPES) ×3 IMPLANT
FILTER STRAW FLUID ASPIR (MISCELLANEOUS) IMPLANT
FORCEPS BIOP SUPERTRX PREMAR (INSTRUMENTS) ×2 IMPLANT
GAUZE SPONGE 4X4 12PLY STRL (GAUZE/BANDAGES/DRESSINGS) ×3 IMPLANT
GLOVE BIO SURGEON STRL SZ7.5 (GLOVE) ×6 IMPLANT
GLOVE BIOGEL PI IND STRL 6.5 (GLOVE) IMPLANT
GLOVE BIOGEL PI INDICATOR 6.5 (GLOVE) ×2
GLOVE ECLIPSE 6.0 STRL STRAW (GLOVE) ×2 IMPLANT
GLOVE SURG SS PI 7.0 STRL IVOR (GLOVE) ×2 IMPLANT
GOWN SPEC L4 XLG W/TWL (GOWN DISPOSABLE) ×2 IMPLANT
KIT CLEAN ENDO COMPLIANCE (KITS) ×3 IMPLANT
KIT LOCATABLE GUIDE (CANNULA) IMPLANT
KIT MARKER FIDUCIAL DELIVERY (KITS) IMPLANT
KIT PROCEDURE EDGE 180 (KITS) IMPLANT
KIT PROCEDURE EDGE 45 (KITS) IMPLANT
KIT PROCEDURE EDGE 90 (KITS) ×2 IMPLANT
KIT ROOM TURNOVER OR (KITS) ×3 IMPLANT
MARKER SKIN DUAL TIP RULER LAB (MISCELLANEOUS) ×3 IMPLANT
NDL SUPERTRX PREMARK BIOPSY (NEEDLE) IMPLANT
NEEDLE SUPERTRX PREMARK BIOPSY (NEEDLE) ×3 IMPLANT
NS IRRIG 1000ML POUR BTL (IV SOLUTION) ×3 IMPLANT
OIL SILICONE PENTAX (PARTS (SERVICE/REPAIRS)) ×3 IMPLANT
PAD ARMBOARD 7.5X6 YLW CONV (MISCELLANEOUS) ×6 IMPLANT
PATCHES PATIENT (LABEL) ×3 IMPLANT
SYR 20CC LL (SYRINGE) ×3 IMPLANT
SYR 20ML ECCENTRIC (SYRINGE) ×3 IMPLANT
SYR 50ML SLIP (SYRINGE) ×3 IMPLANT
SYSTEM GENCUT CORE BIOPSY (NEEDLE) ×2 IMPLANT
TOWEL OR 17X24 6PK STRL BLUE (TOWEL DISPOSABLE) ×3 IMPLANT
TRAP SPECIMEN MUCOUS 40CC (MISCELLANEOUS) IMPLANT
TUBE CONNECTING 20'X1/4 (TUBING) ×1
TUBE CONNECTING 20X1/4 (TUBING) ×2 IMPLANT

## 2016-02-26 NOTE — Anesthesia Procedure Notes (Signed)
Procedure Name: Intubation Date/Time: 02/26/2016 8:45 AM Performed by: Luciana Axe K Pre-anesthesia Checklist: Patient identified, Emergency Drugs available, Suction available, Patient being monitored and Timeout performed Patient Re-evaluated:Patient Re-evaluated prior to inductionOxygen Delivery Method: Circle system utilized Preoxygenation: Pre-oxygenation with 100% oxygen Intubation Type: IV induction Ventilation: Mask ventilation without difficulty and Oral airway inserted - appropriate to patient size Laryngoscope Size: Miller and 2 Grade View: Grade I Tube type: Oral Tube size: 8.5 mm Number of attempts: 1 Airway Equipment and Method: Stylet Placement Confirmation: ETT inserted through vocal cords under direct vision,  positive ETCO2,  CO2 detector and breath sounds checked- equal and bilateral Secured at: 22 cm Tube secured with: Tape Dental Injury: Teeth and Oropharynx as per pre-operative assessment

## 2016-02-26 NOTE — Anesthesia Preprocedure Evaluation (Addendum)
Anesthesia Evaluation  Patient identified by MRN, date of birth, ID band Patient awake    Reviewed: Allergy & Precautions, NPO status , Patient's Chart, lab work & pertinent test results  Airway Mallampati: II  TM Distance: >3 FB Neck ROM: Full    Dental no notable dental hx. (+) Partial Upper, Dental Advisory Given   Pulmonary shortness of breath, asthma , sleep apnea , COPD,  COPD inhaler, former smoker,    Pulmonary exam normal breath sounds clear to auscultation       Cardiovascular hypertension, Pt. on medications Normal cardiovascular exam Rhythm:Regular Rate:Normal     Neuro/Psych negative neurological ROS  negative psych ROS   GI/Hepatic negative GI ROS, Neg liver ROS, GERD  Medicated,  Endo/Other  negative endocrine ROS  Renal/GU negative Renal ROS  negative genitourinary   Musculoskeletal negative musculoskeletal ROS (+)   Abdominal   Peds negative pediatric ROS (+)  Hematology negative hematology ROS (+)   Anesthesia Other Findings   Reproductive/Obstetrics negative OB ROS                            Anesthesia Physical Anesthesia Plan  ASA: III  Anesthesia Plan: General   Post-op Pain Management:    Induction: Intravenous  Airway Management Planned: Oral ETT  Additional Equipment:   Intra-op Plan:   Post-operative Plan: Extubation in OR  Informed Consent: I have reviewed the patients History and Physical, chart, labs and discussed the procedure including the risks, benefits and alternatives for the proposed anesthesia with the patient or authorized representative who has indicated his/her understanding and acceptance.   Dental advisory given  Plan Discussed with: CRNA and Surgeon  Anesthesia Plan Comments:         Anesthesia Quick Evaluation

## 2016-02-26 NOTE — Op Note (Addendum)
Video Bronchoscopy with Electromagnetic Navigation Procedure Note  Date of Operation: 02/26/2016  Pre-op Diagnosis: LUL ground glass nodule  Post-op Diagnosis: same  Surgeon: Baltazar Apo  Assistants: none  Anesthesia: General endotracheal anesthesia  Operation: Flexible video fiberoptic bronchoscopy with electromagnetic navigation and biopsies.  Estimated Blood Loss: Minimal  Complications: none apparent  Indications and History: Miguel Morales is a 70 y.o. male with history of tobacco use. He's been followed in the outpatient setting with serial CT scans of the chest for a very slowly enlarging left upper lobe groundglass nodule. Recommendation was made to attempt tissue diagnosis via navigational bronchoscopy. The risks, benefits, complications, treatment options and expected outcomes were discussed with the patient.  The possibilities of pneumothorax, pneumonia, reaction to medication, pulmonary aspiration, perforation of a viscus, bleeding, failure to diagnose a condition and creating a complication requiring transfusion or operation were discussed with the patient who freely signed the consent.    Description of Procedure: The patient was seen in the Preoperative Area, was examined and was deemed appropriate to proceed.  The patient was taken to OR 10, identified as Miguel Morales and the procedure verified as Flexible Video Fiberoptic Bronchoscopy.  A Time Out was held and the above information confirmed.   Prior to the date of the procedure a high-resolution CT scan of the chest was performed. Utilizing Martin a virtual tracheobronchial tree was generated to allow the creation of distinct navigation pathways to the patient's left upper lobe parenchymal abnormality. After being taken to the operating room general anesthesia was initiated and the patient  was orally intubated. The video fiberoptic bronchoscope was introduced via the endotracheal tube and a general  inspection was performed which showed normal airways throughout. There is slight collapsibility of the left lower lobe airways but these were otherwise normal. There were no abnormal secretions or endobronchial lesions seen throughout the entire exam.  2 right mainstem endobronchial brushings were performed to be sent for Percepta molecular testing. The extendable working channel and locator guide were introduced into the bronchoscope. The distinct navigation pathways prepared prior to this procedure were then utilized to navigate to within 0.8-0.5 cm of patient's lesion identified on CT scan. The extendable working channel was secured into place and the locator guide was withdrawn. Under fluoroscopic guidance transbronchial needle brushings, triple brushings, transbronchial Wang needle biopsies, Gen-Cut biopsies and transbronchial forceps biopsies were performed to be sent for cytology and pathology. A bronchioalveolar lavage was performed in the left upper lobe and sent for cytology and microbiology (bacterial, fungal, AFB smears and cultures). At the end of the procedure a general airway inspection was performed and there was no evidence of active bleeding. The bronchoscope was removed.  The patient tolerated the procedure well. There was no significant blood loss and there were no obvious complications. A post-procedural chest x-ray is pending.  Samples: 1. Transbronchial needle brushings and triple brushings from left upper lobe nodule 2. Transbronchial Wang needle and Gen-Cut biopsies from left upper lobe nodule 3. Transbronchial forceps biopsies from upper lobe nodule 4. Bronchoalveolar lavage from left upper lobe 5. Endobronchial brushings from right mainstem bronchus  Plans:  The patient will be discharged from the PACU to home when recovered from anesthesia and after chest x-ray is reviewed. We will review the cytology, pathology and microbiology results with the patient when they become  available. Outpatient followup will be with Dr Chase Caller or Dr Lamonte Sakai.    Baltazar Apo, MD, PhD 02/26/2016, 10:52 AM [facility] [phone] or if no answer [phone]

## 2016-02-26 NOTE — Interval H&P Note (Signed)
PCCM interval Note  Miguel Morales presents today for further evaluation of his 11m LUL GG nodule. Low level hypermetabolism on PET scan. He is doing well, reports no new issues or problems. He underwent a reassuring cardiac stress test in March. We discussed the rationale of transbronchial bx vs primary resection today. Explained the bronchoscopy, risks, benefits to him this am. All questions answered.   Filed Vitals:   02/26/16 0707  BP: 133/92  Pulse: 78  Temp: 97.9 F (36.6 C)  TempSrc: Oral  Resp: 18  SpO2: 97%  Gen: Pleasant, overwt elderly man, in no distress,  normal affect  ENT: No lesions,  mouth clear,  Some missing teeth, M3 airway, oropharynx clear, no postnasal drip  Neck: No JVD, no stridor  Lungs: No use of accessory muscles, clear without rales or rhonchi  Cardiovascular: RRR, heart sounds normal, no murmur or gallops, trace to 1+ peripheral edema B  Musculoskeletal: No deformities, no cyanosis or clubbing  Neuro: alert, non focal  Skin: Warm, no lesions or rashes   Recent Labs Lab 02/20/16 1438  HGB 14.1  HCT 42.6  WBC 7.5  PLT 265    Recent Labs Lab 02/20/16 1438  INR 1.05    Recent Labs Lab 02/20/16 1438  NA 144  K 3.6  CL 107  CO2 24  GLUCOSE 144*  BUN 19  CREATININE 1.13  CALCIUM 9.0    CT chest 01/29/16 --  COMPARISON: Multiple prior chest CTs. The most recent is 07/24/2015  FINDINGS: Mediastinum/Nodes: No chest wall mass, supraclavicular or axillary lymphadenopathy.  The heart is normal in size. No pericardial effusion. Dense 3 vessel coronary artery calcifications noted. There is mild tortuosity, ectasia and calcification the thoracic aorta which is stable.  Small scattered mediastinal and hilar lymph nodes but no mass or adenopathy. The esophagus is normal.  Lungs/Pleura: Stable emphysematous changes. No acute pulmonary findings. There is a stable left upper lobe ground-glass opacity on image number 28 which measures  14.5 x 11.5 mm. This is unchanged since the most recent chest CT. It is slightly larger that was in March 2015. Recommend continued surveillance. No new pulmonary lesions. No pleural effusion. No bronchiectasis or interstitial lung disease.  Upper abdomen: No significant findings.  Musculoskeletal: No significant findings. Stable advanced degenerative changes involving the thoracic spine.  IMPRESSION: 1. Stable left upper lobe ground-glass nodule measuring 14.5 x 11.5 mm. This has increased slightly in size since the regional chest CT in March 2015.   Plans:  We will proceed with navigational FOB under general anesthesia to facilitate TBBx of his LUL nodule. Risks and benefits explained and he agrees to proceed. Will also perform Percepta testing today to assist with risk analysis.   RBaltazar Apo MD, PhD 02/26/2016, 8:36 AM [facility] [phone] if no answer [phone]

## 2016-02-26 NOTE — H&P (View-Only) (Signed)
Subjective:    Patient ID: Miguel Morales, male    DOB: 18-Mar-1946, 70 y.o.   MRN: [id number]  HPI    OV 08/01/2015  Chief Complaint  Patient presents with  . Follow-up    Pt states his breathing is no better from last OV in 12/2014. Pt states he can detect a change in his voice- hoarseness. Pt c/o mild dry cough and upper mid back pain. Pt denies chest congestion and CP/tightness.      Lung nodule LUL 29m and 415mRLLL  -both new April 2015. We worked this up further with PET scan 05/11/10/5; indeterminate low prob. And with INDI Dx blood tests indiDx 05/31/14 puts probability of lung nodule being benign at 90%. Had CT chest now 14 2016 and the groundglass opacities unchanged at left upper lobe but reported at 15 mm the radiologist says is unchanged. He is only inclined to follow it given the stability over 18 months. I personally visualized the film   Dyspnea that is multifactorial and poorly explain but being treated as COPD: This is stable but persists and impairs his quality-of-life. He is on inhaler therapy. This no worsening of flare up. He will have a flu shot he is now seeing an allergist Miguel [place]   [place]  Chief Complaint  Patient presents with  . Follow-up    Pt states his breathing has worsened since last OV in 07/2015. Pt states he becomes more SOB with less activity, pt c/o cough with little mucus production. Pt denies CP/tightness. Pt using CPAP nightly.    Follow-up dyspnea: This continues to persist. In fact it is worse. He does daily walks 3 times a week and slower now. This was associated chest pain or new medical problems. He says he is attended pulmonary rehabilitation in the past and it only improved a little bit. Now it has deteriorated. Nevertheless he does not want to attend pulmonary habitation again. He is not willing to undergo repeat pulmonary stress testing. CT scan chest 2 days ago does not show any interstitial lung disease  or any new problems. He just wants [place] follow this expectantly  Left upper lobe lung nodule: Seems slightly grown since 2015. It is more of a groundglass opacity.  reports that he quit smoking about 12 years ago. His smoking use included Cigarettes. He has a 30 pack-year smoking history. He has never used smokeless tobacco.    Ct Chest Wo Contrast  01/29/2016  CLINICAL DATA:  Followup pulmonary nodules. EXAM: CT CHEST WITHOUT CONTRAST TECHNIQUE: Multidetector CT imaging of the chest was performed following the standard protocol without IV contrast. COMPARISON:  Multiple prior chest CTs. The most recent is 07/24/2015 FINDINGS: Mediastinum/Nodes: No chest wall mass, supraclavicular or axillary lymphadenopathy. The heart is normal in size. No pericardial effusion. Dense 3 vessel coronary artery calcifications noted. There is mild tortuosity, ectasia and calcification the thoracic aorta which is stable. Small scattered mediastinal and hilar lymph nodes but no mass or adenopathy. The esophagus is normal. Lungs/Pleura: Stable emphysematous changes. No acute pulmonary findings. There is a stable left upper lobe ground-glass opacity on image number 28 which measures 14.5 x 11.5 mm. This is unchanged since the most recent chest CT. It is slightly larger that was in March 2015. Recommend continued surveillance. No new pulmonary lesions. No pleural effusion. No bronchiectasis or interstitial lung disease. Upper abdomen: No significant findings. Musculoskeletal: No significant findings. Stable advanced degenerative changes involving the thoracic  spine. IMPRESSION: 1. Stable left upper lobe ground-glass nodule measuring 14.5 x 11.5 mm. This has increased slightly in size since the regional chest CT in March 2015. Recommend repeat noncontrast chest CT in 2 years. (CT is recommended every 2 years until 5 years of stability has been established. This recommendation follows the consensus statement: Guidelines for Management  of Incidental Pulmonary Nodules Detected on CT Images:From the Fleischner Society 2017; published online before print (10.1148/radiol.8185631497)). 2. Stable emphysematous changes. No acute pulmonary findings. No new pulmonary lesions. 3. Stable atherosclerotic calcifications involving the aorta and branch vessels including the Coronary arteries. Electronically Signed   By: Miguel Sanes M.D.   On: 01/29/2016 13:38       Review of Systems       Objective:   Physical Exam  Filed Vitals:   01/31/16 1331  BP: 142/90  Pulse: 85  Height: '5\' 6"'$  (1.676 m)  Weight: 207 lb (93.895 kg)  SpO2: 96%         Assessment & Plan:     ICD-9-CM ICD-10-CM   1. Shortness of breath 786.05 R06.02   2. Lung nodule 44m Left Upper Lobe Ground Glass and 475mRight Right Lower lobe - seen April 2015 and NEW problem 793.11 R91.1     #Shortness of breath - Too bad still persists - No evidence of Interstistial Lung Disease  -Pulmonary stress test suggests that this is due to you slightly being overweight and due to physical deconditioning  - PFT suggests mild emphysema  PLAN -  Continue weight loss and exercise therapy and inhaler therapy - We discussed repeat pulmonary stress test but have opted now to just follow this along    #Lung nodule 1364meft Upper Lobe Ground Glass and 4mm41mght Right Lower lobe - seen April 2015 and NEW problem.. STable Sept 2016. Slightly larger March 2017 compared to 2015 spring  - concern Is that this can be early stage stage I lung cancer non-small cell - I recommend that he undergo a test called Percepta brushing of the airway - I also recommend that you might need to undergo a test called Electra navigational bronchoscopy with biopsy by Miguel Morales combination or either of these 2 tests can help us [place] closer to the truth if this is early stage lung cancer or not - If this is early stage lung cancer then surgical resection should give you cure - Other  alternative is to continue to follow this along with CT scan chest every 6 months but I less favor this method right now  #Followup - Please call us [phone] week with the decision - Otherwise return to see me in 3 months  (> 50% of this 15 min visit spent in face to face counseling or/and coordination of care)   Dr. MuraBrand MalesD., F.C.[facility] Pulmonary and Critical Care Medicine Staff Physician [facility] Pager: [phone] no answer or between  15:00h - 7:00h: call [phone]  01/31/2016 1:56 PM

## 2016-02-26 NOTE — Transfer of Care (Signed)
Immediate Anesthesia Transfer of Care Note  Patient: Miguel Morales  Procedure(s) Performed: Procedure(s): VIDEO BRONCHOSCOPY WITH ENDOBRONCHIAL NAVIGATION LEFT UPPER LOBE LUNG NODULE (N/A)  Patient Location: PACU  Anesthesia Type:General  Level of Consciousness: awake, alert , oriented and patient cooperative  Airway & Oxygen Therapy: Patient Spontanous Breathing and Patient connected to nasal cannula oxygen  Post-op Assessment: Report given to RN and Post -op Vital signs reviewed and stable  Post vital signs: Reviewed and stable  Last Vitals:  Filed Vitals:   02/26/16 0707  BP: 133/92  Pulse: 78  Temp: 36.6 C  Resp: 18    Complications: No apparent anesthesia complications

## 2016-02-26 NOTE — OR Nursing (Signed)
Precepta RNA sampler ,left upper lobe lung bronchial brushing times two taken by Dr. Lamonte Sakai to be sent out for genetic testing.

## 2016-02-26 NOTE — Discharge Instructions (Signed)
Flexible Bronchoscopy, Care After °These instructions give you information on caring for yourself after your procedure. Your doctor may also give you more specific instructions. Call your doctor if you have any problems or questions after your procedure. °HOME CARE °· Do not eat or drink anything for 2 hours after your procedure. If you try to eat or drink before the medicine wears off, food or drink could go into your lungs. You could also burn yourself. °· After 2 hours have passed and when you can cough and gag normally, you may eat soft food and drink liquids slowly. °· The day after the test, you may eat your normal diet. °· You may do your normal activities. °· Keep all doctor visits. °GET HELP RIGHT AWAY IF: °· You get more and more short of breath. °· You get light-headed. °· You feel like you are going to pass out (faint). °· You have chest pain. °· You have new problems that worry you. °· You cough up more than a little blood. °· You cough up more blood than before. °MAKE SURE YOU: °· Understand these instructions. °· Will watch your condition. °· Will get help right away if you are not doing well or get worse. °  °This information is not intended to replace advice given to you by your health care provider. Make sure you discuss any questions you have with your health care provider. ° °Please call our office for any questions or concerns. [phone].  °  °Document Released: 08/23/2009 Document Revised: 10/31/2013 Document Reviewed: 06/30/2013 °Elsevier Interactive Patient Education ©2016 Elsevier Inc. ° °

## 2016-02-26 NOTE — Anesthesia Postprocedure Evaluation (Signed)
Anesthesia Post Note  Patient: SHAHZAIN KIESTER  Procedure(s) Performed: Procedure(s) (LRB): VIDEO BRONCHOSCOPY WITH ENDOBRONCHIAL NAVIGATION LEFT UPPER LOBE LUNG NODULE (N/A)  Patient location during evaluation: PACU Anesthesia Type: General Level of consciousness: awake, awake and alert and oriented Pain management: pain level controlled Vital Signs Assessment: post-procedure vital signs reviewed and stable Respiratory status: spontaneous breathing, respiratory function stable, nonlabored ventilation and patient connected to nasal cannula oxygen Cardiovascular status: blood pressure returned to baseline Anesthetic complications: no    Last Vitals:  Filed Vitals:   02/26/16 1115 02/26/16 1136  BP: 128/91 125/74  Pulse: 79 79  Temp:    Resp: 19 20    Last Pain: There were no vitals filed for this visit.               Pepe Mineau COKER

## 2016-02-26 NOTE — Progress Notes (Signed)
Protocol Title:  Informed Consent   Subject Name: [name]   This patient, Miguel Morales, has been consented to the above clinical trial according to FDA regulations, GCP guidelines and [employer]'s SOPs. The informed consent form and study design have been explained to this patient by this study coordinator. The patient demonstrated comprehension of this clinical trial and study requirements/expectations. No study procedures have been initiated before consenting of this patient. The patient was given sufficient time for reading the consent form. All risks, benefits and options have been thoroughly discussed and all questions were answered per the patient's satisfaction. Patient is aware they will not receive any compensation for their participation in this study. This patient was not coerced in any way to participate in this clinical trial. This patient has voluntarily signed consent version 3 at 0753 on 19Apr2017 in [facility] Room #44. A copy of the signed consent form was given to the patient and a copy was placed in the subject's medical record. [name], patients spouse, was also present at time of consent.   [place] Bing, [place]    Baltazar Apo, MD, PhD 02/26/2016, 10:45 AM  Pulmonary and Critical Care [phone] or if no answer [phone]

## 2016-02-27 ENCOUNTER — Encounter (HOSPITAL_COMMUNITY): Payer: Self-pay | Admitting: Emergency Medicine

## 2016-02-27 LAB — ACID FAST SMEAR (AFB, MYCOBACTERIA): Acid Fast Smear: NEGATIVE

## 2016-02-27 LAB — ACID FAST SMEAR (AFB)

## 2016-02-28 ENCOUNTER — Encounter: Payer: Self-pay | Admitting: *Deleted

## 2016-02-28 ENCOUNTER — Telehealth: Payer: Self-pay | Admitting: Emergency Medicine

## 2016-02-28 ENCOUNTER — Telehealth: Payer: Self-pay | Admitting: *Deleted

## 2016-02-28 DIAGNOSIS — C3492 Malignant neoplasm of unspecified part of left bronchus or lung: Secondary | ICD-10-CM

## 2016-02-28 LAB — CULTURE, RESPIRATORY

## 2016-02-28 LAB — CULTURE, RESPIRATORY W GRAM STAIN: Culture: NO GROWTH

## 2016-02-28 NOTE — Telephone Encounter (Signed)
Oncology Nurse Navigator Documentation  Oncology Nurse Navigator Flowsheets 02/28/2016  Navigator Encounter Type Telephone  Telephone Outgoing Call  Treatment Phase Pre-Tx/Tx Discussion  Barriers/Navigation Needs Coordination of Care  Interventions Coordination of Care  Coordination of Care Appts  Acuity Level 1  Time Spent with Patient 15   I called patient to schedule.  I gave him an appt for 03/12/16 but he would like to post pone until 03/19/16.  I gave him appt time and day.  He verbalized understanding of appt

## 2016-02-28 NOTE — Progress Notes (Signed)
Oncology Nurse Navigator Documentation  Oncology Nurse Navigator Flowsheets 02/28/2016  Navigator Encounter Type Other  Treatment Phase Pre-Tx/Tx Discussion  Barriers/Navigation Needs Coordination of Care  Interventions Coordination of Care  Coordination of Care Appts  Acuity Level 1  Time Spent with Patient 15   I received referral on Mr. Marullo today.  Patient needs PET scan before being scheduled.  PET scan is ordered and I notified referring office I will schedule after PET

## 2016-02-28 NOTE — Telephone Encounter (Signed)
I spoke with patient and reviewed the  tissue diagnosis with him. This is a well-differentiated adenocarcinoma, most consistent with slow-growing high-grade malignancy. I discussed the potential therapies with him including possible surgical resection. He was a marginal candidate for primary resection which is why we pursued navigational bronchoscopy. All same he may be able to tolerate a wedge resection. I will send him to multi this with her thoracic oncology clinic to discuss the options. His last PET scan was in 2015 so I will repeat this test as well.

## 2016-03-02 ENCOUNTER — Telehealth: Payer: Self-pay | Admitting: Emergency Medicine

## 2016-03-02 ENCOUNTER — Encounter: Payer: Self-pay | Admitting: Emergency Medicine

## 2016-03-02 NOTE — Telephone Encounter (Signed)
RB please advise on biopsy results. Thanks.

## 2016-03-02 NOTE — Telephone Encounter (Signed)
Patient called and states that he is still coughing up blood, primarily in the am. He would like to know what he should do. He can be reached at [phone]. - Thanks -prm

## 2016-03-02 NOTE — Telephone Encounter (Signed)
Patient called back returning Lindsay's call. He can be reached at [phone]

## 2016-03-02 NOTE — Telephone Encounter (Signed)
LMOM TCB x1 to address pt's concerns and to let him know that Ria Comment has sent his message to [place] to address.

## 2016-03-02 NOTE — Telephone Encounter (Signed)
Spoke with pt, states that his mucus is tinged with light pink qam.  I advised pt that per RB's recs below this is normal post biopsy, but to contact us if this gets worse or does not improve.  Pt expressed understanding. Pt also requesting biopsy results be mailed to him.  This has been sent to verified home address.  Nothing further needed.

## 2016-03-02 NOTE — Telephone Encounter (Signed)
See telephone encounter. MyChart message will be closed.

## 2016-03-02 NOTE — Telephone Encounter (Signed)
Visit Follow-Up Question     From   Miguel Morales    To   [name], MD    Sent   03/02/2016 9:59 AM       I have not seen any info or comments concerning the biopsy that Dr. Lamonte Sakai performed on Aprril 19, 2017 on my person at [facility]. Please advise. also,I continue to have some morning blood in my cough early in the day. Itis mosly pink in color.        I spoke with RB, he states that he already spoke with the pt regarding his biopsy results. As for the blood in his mucus, this is to be expected after the type of procedure he had. If there is a large amount of blood then he will need to be seen. lmtcb x1 for pt.

## 2016-03-06 ENCOUNTER — Other Ambulatory Visit: Payer: Self-pay | Admitting: Emergency Medicine

## 2016-03-06 ENCOUNTER — Ambulatory Visit (HOSPITAL_COMMUNITY)
Admission: RE | Admit: 2016-03-06 | Discharge: 2016-03-06 | Disposition: A | Discharge status: Home / Self Care | Payer: Medicare Other | Source: Ambulatory Visit | Attending: Emergency Medicine | Admitting: Emergency Medicine

## 2016-03-06 DIAGNOSIS — C3492 Malignant neoplasm of unspecified part of left bronchus or lung: Secondary | ICD-10-CM

## 2016-03-06 LAB — GLUCOSE, CAPILLARY: GLUCOSE-CAPILLARY: 85 mg/dL (ref 65–99)

## 2016-03-06 MED ORDER — FLUDEOXYGLUCOSE F - 18 (FDG) INJECTION
10.4200 | Freq: Once | INTRAVENOUS | Status: AC | PRN
  Administered 2016-03-06: 10.42 via INTRAVENOUS

## 2016-03-09 ENCOUNTER — Telehealth: Payer: Self-pay | Admitting: Internal Medicine

## 2016-03-09 NOTE — Telephone Encounter (Signed)
Called spoke with pt. Pt is requesting PET scan results from 03/06/16. I explained to him that we have not received RB's results and recs. I informed him that I would send a message to RB to address. He voiced understanding and had no further questions.   RB please advise on results

## 2016-03-10 NOTE — Telephone Encounter (Signed)
Pt calling back about results.Miguel Morales ° °

## 2016-03-10 NOTE — Telephone Encounter (Signed)
lmomtcb for pt - RB off yesterday and today.  He will return tomorrow.

## 2016-03-10 NOTE — Telephone Encounter (Signed)
Patient notified that Dr. Lamonte Sakai will be back tomorrow.  Patient would like a copy of the results, he will be in [place] area tomorrow afternoon (1pm) and would like a copy left up front for him. Advised patient that it would be best to wait and get a copy after Dr. Lamonte Sakai has discussed the results with him so there is no confusion as to what the results say. Patient agreed and said that he will await Dr. Agustina Caroli call.  To RB. Please advise on PET scan results.

## 2016-03-10 NOTE — Telephone Encounter (Signed)
[phone] calling back

## 2016-03-11 NOTE — Telephone Encounter (Signed)
Spoke with pt in the lobby .  Advised that Dr Lamonte Sakai will be in after 1:30 today and we cannot release results of PET until he has reviewed them.  Pt is wanting to file a copy of PET with the [place].  I instructed pt that if he can get a fax number for [place] to send to we will be glad to fax a copy once the results are reviewed by Dr Lamonte Sakai.  Pt verbalized understanding.  Dr Lamonte Sakai , please advise on results.

## 2016-03-11 NOTE — Telephone Encounter (Signed)
I reviewed results w him. Please provide a copy of the PET scan report for him when he comes back by this afternoon.

## 2016-03-11 NOTE — Telephone Encounter (Signed)
Spoke with pt and advised that copy of PET was left at front desk for pick up.

## 2016-03-18 ENCOUNTER — Telehealth: Payer: Self-pay | Admitting: *Deleted

## 2016-03-18 ENCOUNTER — Ambulatory Visit (INDEPENDENT_AMBULATORY_CARE_PROVIDER_SITE_OTHER): Payer: Medicare Other | Admitting: Internal Medicine

## 2016-03-18 ENCOUNTER — Encounter: Payer: Self-pay | Admitting: Internal Medicine

## 2016-03-18 VITALS — BP 150/84 | HR 108 | Ht 66.0 in | Wt 212.0 lb

## 2016-03-18 DIAGNOSIS — J449 Chronic obstructive pulmonary disease, unspecified: Secondary | ICD-10-CM | POA: Diagnosis not present

## 2016-03-18 DIAGNOSIS — C3492 Malignant neoplasm of unspecified part of left bronchus or lung: Secondary | ICD-10-CM | POA: Diagnosis not present

## 2016-03-18 NOTE — Telephone Encounter (Signed)
Called and confirmed 03/19/16 clinic appt w/ pt.

## 2016-03-18 NOTE — Progress Notes (Signed)
Subjective:     Patient ID: Miguel Morales, male   DOB: 25-Jan-1946, 70 y.o.   MRN: [id number]  HPI   OV 03/18/2016  Chief Complaint  Patient presents with  . Follow-up    pt states breathing is baseline since last OV. c/o DOE, non prod cough, occ wheezing. 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 He already knows results from post op perioid He has had PET scan since. He is not sure he wants surgery but I advise dhim that might be best opinion. Fev1 1.96L/71%, DCL 19.5/72% from feb 2016. He is due to see both rad onc Dr Tammi Klippel and CVTS Dr Roxan Hockey tomorrow . No other issues    has a past medical history of COPD (chronic obstructive pulmonary disease) ([place]); Mixed hyperlipidemia; GERD (gastroesophageal reflux disease); Cancer ([place]); Asthma; Spinal stenosis; Shortness of breath dyspnea; Hypertension; Sleep apnea; History of bronchitis; Nocturia; and Arthritis.   reports that he quit smoking about 12 years ago. His smoking use included Cigarettes. He started smoking about 57 years ago. He has a 30 pack-year smoking history. He has never used smokeless tobacco.  Past Surgical History  Procedure Laterality Date  . Knee arthroscopy Right 2004  . Septoplasty    . Circumcision    . Colonoscopy  03/04/2004    TKZ:SWFU-XNATF diverticula.  The remainder of the colonic mucosa appeared normal/normal rectum  . Colonoscopy  05/22/2009    TDD:UKGURKY mucosa appeared normal/Left-sided diverticula/Normal rectum  . Colonoscopy N/A 08/02/2014    Procedure: COLONOSCOPY;  Surgeon: Daneil Dolin, MD;  [facility];  Service: Endoscopy;  Laterality: N/A;  2:00 PM  . Video bronchoscopy with endobronchial navigation N/A 02/26/2016    Procedure: VIDEO BRONCHOSCOPY WITH ENDOBRONCHIAL NAVIGATION LEFT UPPER LOBE LUNG NODULE;  Surgeon: [name], MD;  Location: [facility];  Service: Thoracic;  Laterality: N/A;    Allergies  Allergen Reactions  .  Sulfa Antibiotics     Immunization History  Administered Date(s) Administered  . Influenza-Unspecified 07/25/2013, 08/07/2014, 07/23/2015  . Pneumococcal Conjugate-13 05/09/2014  . Pneumococcal Polysaccharide-23 11/10/2011  . Td 11/09/2010  . Zoster 12/14/2012    Family History  Problem Relation Age of Onset  . Cancer Mother      Current outpatient prescriptions:  .  acetaminophen (TYLENOL) 500 MG tablet, Take 500 mg by mouth every 6 (six) hours as needed., Disp: , Rfl:  .  albuterol (PROVENTIL HFA;VENTOLIN HFA) 108 (90 Base) MCG/ACT inhaler, Inhale 2 puffs into the lungs every 6 (six) hours as needed for wheezing., Disp: 3 Inhaler, Rfl: 2 .  amLODipine (NORVASC) 10 MG tablet, Take 1 tablet (10 mg total) by mouth daily., Disp: 90 tablet, Rfl: 1 .  Ascorbic Acid (VITAMIN C) 1000 MG tablet, Take 1,000 mg by mouth daily., Disp: , Rfl:  .  aspirin 81 MG tablet, Take 81 mg by mouth daily., Disp: , Rfl:  .  atorvastatin (LIPITOR) 40 MG tablet, Take 1 tablet (40 mg total) by mouth daily., Disp: 90 tablet, Rfl: 1 .  budesonide-formoterol (SYMBICORT) 160-4.5 MCG/ACT inhaler, Inhale 2 puffs into the lungs 2 (two) times daily., Disp: , Rfl:  .  Coenzyme Q10 (COQ10) 400 MG CAPS, Take 1 capsule by mouth daily. , Disp: , Rfl:  .  Flaxseed, Linseed, (FLAXSEED OIL) 1000 MG CAPS, Take 1 capsule by mouth daily., Disp: , Rfl:  .  fluticasone (FLONASE) 50 MCG/ACT nasal spray, USE 1 SPRAY  IN EACH NOSTRIL DAILY AS NEEDED, Disp: 16 g, Rfl: 5 .  ibuprofen (ADVIL,MOTRIN) 200 MG tablet, Take 800 mg by mouth 2 (two) times daily as needed. , Disp: , Rfl:  .  ketotifen (ZADITOR) 0.025 % ophthalmic solution, Place 1 drop into both eyes 2 (two) times daily as needed., Disp: , Rfl:  .  lidocaine (XYLOCAINE) 5 % ointment, Apply 1 application topically as needed for mild pain (For Back and hip). , Disp: , Rfl:  .  Loratadine 10 MG CAPS, Take by mouth daily. PRN, Disp: , Rfl:  .  mometasone (ELOCON) 0.1 % lotion,  APPLY TOPICALLY DAILY, Disp: 60 mL, Rfl: 3 .  omeprazole (PRILOSEC) 20 MG capsule, TAKE 1 CAPSULE DAILY AS NEEDED, Disp: 90 capsule, Rfl: 1 .  SPIRIVA HANDIHALER 18 MCG inhalation capsule, INHALE THE CONTENTS OF 1 CAPSULE DAILY, Disp: 90 capsule, Rfl: 1 .  tamsulosin (FLOMAX) 0.4 MG CAPS capsule, Take 1 tablet by mouth at bedtime., Disp: 90 capsule, Rfl: 1 .  triamcinolone (KENALOG) 0.025 % cream, Apply 1 application topically 2 (two) times daily., Disp: , Rfl:  .  Turmeric Curcumin 500 MG CAPS, Take 1 capsule by mouth daily., Disp: , Rfl:      Review of Systems     Objective:   Physical Exam Filed Vitals:   03/18/16 1620  BP: 150/84  Pulse: 108  Height: '5\' 6"'$  (1.676 m)  Weight: 212 lb (96.163 kg)  SpO2: 94%     Discussion only visit    Assessment:       ICD-9-CM ICD-10-CM   1. Non-small cell carcinoma of left lung, stage 1 (HCC) 162.9 C34.92   2. Moderate COPD (chronic obstructive pulmonary disease) ([place]) 496 J44.9        Plan:      - new diagnosis - early stage lung cancer - likely surgical resection preferred option   - keep fu with DR Tammi Klippel and Dr Roxan Hockey to see what they decide  - copd  - stable   - continue inhalers  followup   3 months or sooner if needed   (> 50% of this 15 min visit spent in face to face counseling or/and coordination of care)   Dr. Brand Males, M.D., [facility].C.P Pulmonary and Critical Care Medicine Staff Physician [place] Pulmonary and Critical Care Pager: [phone], If no answer or between  15:00h - 7:00h: call [phone]  03/18/2016 5:11 PM

## 2016-03-18 NOTE — Patient Instructions (Signed)
ICD-9-CM ICD-10-CM   1. Non-small cell carcinoma of left lung, stage 1 (HCC) 162.9 C34.92   2. Moderate COPD (chronic obstructive pulmonary disease) (HCC) 496 J44.9     - new diagnosis - early stage lung cancer - likely surgical resection preferred option   - keep fu with DR Tammi Klippel and Dr Roxan Hockey to see what they decide  - copd  - stable   - continue inhalers  followup   3 months or sooner if needed

## 2016-03-19 ENCOUNTER — Institutional Professional Consult (permissible substitution) (INDEPENDENT_AMBULATORY_CARE_PROVIDER_SITE_OTHER): Payer: Medicare Other | Admitting: Thoracic Surgery (Cardiothoracic Vascular Surgery)

## 2016-03-19 ENCOUNTER — Encounter: Payer: Self-pay | Admitting: Thoracic Surgery (Cardiothoracic Vascular Surgery)

## 2016-03-19 ENCOUNTER — Ambulatory Visit: Payer: Medicare Other | Admitting: Physical Therapy

## 2016-03-19 ENCOUNTER — Encounter: Payer: Self-pay | Admitting: *Deleted

## 2016-03-19 ENCOUNTER — Ambulatory Visit
Admission: RE | Admit: 2016-03-19 | Discharge: 2016-03-19 | Disposition: A | Discharge status: Home / Self Care | Payer: Medicare Other | Source: Ambulatory Visit | Attending: Radiation Oncology | Admitting: Radiation Oncology

## 2016-03-19 VITALS — BP 113/76 | HR 97 | Temp 98.4°F | Resp 18 | Wt 212.6 lb

## 2016-03-19 DIAGNOSIS — C3492 Malignant neoplasm of unspecified part of left bronchus or lung: Secondary | ICD-10-CM | POA: Diagnosis not present

## 2016-03-19 NOTE — Progress Notes (Signed)
PCP is Sallee Lange, MD Referring Provider is Curt Bears, MD  No chief complaint on file.   HPI: 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  He is a 70 year old man with a history of tobacco abuse (30 pack years, quit in 2005), COPD, asthma, hypertension, mixed hyperlipidemia, reflux, sleep apnea, spinal stenosis, neurogenic claudication and arthritis. He has been followed for a groundglass nodule in the left upper lobe which was first noted in April 2015. Over 2 years it had slowly increased in size. He recently had a navigational bronchoscopy and biopsy by Dr. Lamonte Sakai which revealed adenocarcinoma with lepidic spread.  He gets short of breath with exertion. He says he can walk up 2 flights of stairs without stopping but would notice he is breathing heavier after the first flight. He really doesn't have much restriction when he is walking on level ground. He does have wheezing and a dry cough. He has sleep apnea and uses CPAP at night. He has pain in his legs with walking due to spinal stenosis which limits his activities. He has not had any change in appetite or weight loss. He had been having some atypical chest pain and had a cardiology evaluation prior to his bronchoscopy. He had a nuclear stress test which showed no evidence of ischemia and normal left ventricular function.  Zubrod Score: At the time of surgery this patient's most appropriate activity status/level should be described as: '[]'$     0    Normal activity, no symptoms '[x]'$     1    Restricted in physical strenuous activity but ambulatory, able to do out light work '[]'$     2    Ambulatory and capable of self care, unable to do work activities, up and about >50 % of waking hours                              '[]'$     3    Only limited self care, in bed greater than 50% of waking hours '[]'$     4    Completely disabled, no self care, confined to bed or  chair '[]'$     5    Moribund    Past Medical History  Diagnosis Date  . COPD (chronic obstructive pulmonary disease) ([place])   . Mixed hyperlipidemia   . GERD (gastroesophageal reflux disease)   . Cancer ([place])   . Asthma   . Spinal stenosis   . Shortness of breath dyspnea     with exertion  . Hypertension   . Sleep apnea     will sometimes wear a CPAP  . History of bronchitis   . Nocturia   . Arthritis     Past Surgical History  Procedure Laterality Date  . Knee arthroscopy Right 2004  . Septoplasty    . Circumcision    . Colonoscopy  03/04/2004    [id number] diverticula.  The remainder of the colonic mucosa appeared normal/normal rectum  . Colonoscopy  05/22/2009    WUX:[id number] mucosa appeared normal/Left-sided diverticula/Normal rectum  . Colonoscopy N/A 08/02/2014    Procedure: COLONOSCOPY;  Surgeon: Daneil Dolin, MD;  Location: [facility];  Service: Endoscopy;  Laterality: N/A;  2:00 PM  . Video bronchoscopy with endobronchial navigation N/A 02/26/2016    Procedure: VIDEO BRONCHOSCOPY WITH ENDOBRONCHIAL NAVIGATION LEFT UPPER LOBE LUNG NODULE;  Surgeon: [name], MD;  Location: [facility] OR;  Service: Thoracic;  Laterality: N/A;    Family History  Problem Relation Age of Onset  . Cancer Mother     Social History Social History  Substance Use Topics  . Smoking status: Former Smoker -- 1.00 packs/day for 30 years    Types: Cigarettes    Start date: 11/09/1958    Quit date: 11/10/2003  . Smokeless tobacco: Never Used  . Alcohol Use: 0.0 oz/week    0 Standard drinks or equivalent per week     Comment: Occasionally    Current Outpatient Prescriptions  Medication Sig Dispense Refill  . acetaminophen (TYLENOL) 500 MG tablet Take 500 mg by mouth every 6 (six) hours as needed.    Marland Kitchen albuterol (PROVENTIL HFA;VENTOLIN HFA) 108 (90 Base) MCG/ACT inhaler Inhale 2 puffs into the lungs every 6 (six) hours as needed for wheezing. 3 Inhaler 2  . amLODipine (NORVASC) 10  MG tablet Take 1 tablet (10 mg total) by mouth daily. 90 tablet 1  . Ascorbic Acid (VITAMIN C) 1000 MG tablet Take 1,000 mg by mouth daily.    Marland Kitchen aspirin 81 MG tablet Take 81 mg by mouth daily.    Marland Kitchen atorvastatin (LIPITOR) 40 MG tablet Take 1 tablet (40 mg total) by mouth daily. 90 tablet 1  . budesonide-formoterol (SYMBICORT) 160-4.5 MCG/ACT inhaler Inhale 2 puffs into the lungs 2 (two) times daily.    . Coenzyme Q10 (COQ10) 400 MG CAPS Take 1 capsule by mouth daily.     . Flaxseed, Linseed, (FLAXSEED OIL) 1000 MG CAPS Take 1 capsule by mouth daily.    . fluticasone (FLONASE) 50 MCG/ACT nasal spray USE 1 SPRAY IN EACH NOSTRIL DAILY AS NEEDED 16 g 5  . ibuprofen (ADVIL,MOTRIN) 200 MG tablet Take 800 mg by mouth 2 (two) times daily as needed.     Marland Kitchen ketotifen (ZADITOR) 0.025 % ophthalmic solution Place 1 drop into both eyes 2 (two) times daily as needed.    . lidocaine (XYLOCAINE) 5 % ointment Apply 1 application topically as needed for mild pain (For Back and hip).     . Loratadine 10 MG CAPS Take by mouth daily. PRN    . mometasone (ELOCON) 0.1 % lotion APPLY TOPICALLY DAILY 60 mL 3  . omeprazole (PRILOSEC) 20 MG capsule TAKE 1 CAPSULE DAILY AS NEEDED 90 capsule 1  . SPIRIVA HANDIHALER 18 MCG inhalation capsule INHALE THE CONTENTS OF 1 CAPSULE DAILY 90 capsule 1  . tamsulosin (FLOMAX) 0.4 MG CAPS capsule Take 1 tablet by mouth at bedtime. 90 capsule 1  . triamcinolone (KENALOG) 0.025 % cream Apply 1 application topically 2 (two) times daily.    . Turmeric Curcumin 500 MG CAPS Take 1 capsule by mouth daily.     No current facility-administered medications for this visit.    Allergies  Allergen Reactions  . Sulfa Antibiotics     Review of Systems  Constitutional: Negative for fever, chills, appetite change and unexpected weight change.       Decreased energy  HENT: Positive for hearing loss. Negative for trouble swallowing and voice change.   Eyes: Negative for photophobia and visual  disturbance.  Respiratory: Positive for cough, shortness of breath (With exertion) and wheezing.   Cardiovascular: Negative for chest pain, palpitations and leg swelling.  Gastrointestinal: Negative for abdominal pain and blood in stool.  Genitourinary: Negative for dysuria and hematuria.  Musculoskeletal: Positive for back pain, joint swelling, arthralgias and gait problem.  Skin:  Itching  Neurological: Negative for syncope, weakness and headaches.  Hematological: Negative for adenopathy. Bruises/bleeds easily.  All other systems reviewed and are negative.   BP 113/76 mmHg  Pulse 97  Temp(Src) 98.4 F (36.9 C)  Resp 18  Wt 212 lb 9.6 oz (96.435 kg)  SpO2 98% Physical Exam  Constitutional: He is oriented to person, place, and time.  obese  HENT:  Head: Normocephalic and atraumatic.  Mouth/Throat: No oropharyngeal exudate.  Eyes: Conjunctivae and EOM are normal. No scleral icterus.  Neck: Neck supple. No thyromegaly present.  Cardiovascular: Normal rate, regular rhythm, normal heart sounds and intact distal pulses.  Exam reveals no gallop and no friction rub.   No murmur heard. Pulmonary/Chest: Effort normal. No respiratory distress. He has no wheezes. He has no rales.  Diminished breath sounds bilaterally secondary to body habitus  Abdominal: Soft. He exhibits no distension. There is no tenderness.  Musculoskeletal: He exhibits no edema.  Lymphadenopathy:    He has no cervical adenopathy.  Neurological: He is alert and oriented to person, place, and time. No cranial nerve deficit.  Motor grossly intact  Skin: Skin is warm and dry.     Diagnostic Tests: CT CHEST WITHOUT CONTRAST  TECHNIQUE: Multidetector CT imaging of the chest was performed following the standard protocol without IV contrast.  COMPARISON: Multiple prior chest CTs. The most recent is 07/24/2015  FINDINGS: Mediastinum/Nodes: No chest wall mass, supraclavicular or  axillary lymphadenopathy.  The heart is normal in size. No pericardial effusion. Dense 3 vessel coronary artery calcifications noted. There is mild tortuosity, ectasia and calcification the thoracic aorta which is stable.  Small scattered mediastinal and hilar lymph nodes but no mass or adenopathy. The esophagus is normal.  Lungs/Pleura: Stable emphysematous changes. No acute pulmonary findings. There is a stable left upper lobe ground-glass opacity on image number 28 which measures 14.5 x 11.5 mm. This is unchanged since the most recent chest CT. It is slightly larger that was in March 2015. Recommend continued surveillance. No new pulmonary lesions. No pleural effusion. No bronchiectasis or interstitial lung disease.  Upper abdomen: No significant findings.  Musculoskeletal: No significant findings. Stable advanced degenerative changes involving the thoracic spine.  IMPRESSION: 1. Stable left upper lobe ground-glass nodule measuring 14.5 x 11.5 mm. This has increased slightly in size since the regional chest CT in March 2015. Recommend repeat noncontrast chest CT in 2 years. (CT is recommended every 2 years until 5 years of stability has been established. This recommendation follows the consensus statement: Guidelines for Management of Incidental Pulmonary Nodules Detected on CT Images:From the Fleischner Society 2017; published online before print (10.1148/radiol.0034917915)). 2. Stable emphysematous changes. No acute pulmonary findings. No new pulmonary lesions. 3. Stable atherosclerotic calcifications involving the aorta and branch vessels including the Coronary arteries.   Electronically Signed  By: Marijo Sanes M.D.  On: 01/29/2016 13:38  NUCLEAR MEDICINE PET SKULL BASE TO THIGH  TECHNIQUE: 10.4 mCi F-18 FDG was injected intravenously. Full-ring PET imaging was performed from the skull base to thigh after the radiotracer. CT data was obtained and  used for attenuation correction and anatomic localization.  FASTING BLOOD GLUCOSE: Value: 85 mg/dl  COMPARISON: CT 01/29/2016, 10/02/2014, PET-CT 06/08/2014.  FINDINGS: NECK  No hypermetabolic lymph nodes in the neck.  CHEST  Ground-glass nodule in the LEFT upper lobe measuring 11 mm x 16 mm compares to 10 mm x 15 mm  PET-CT 06/08/2014 for no significant change. Lesion continues demonstrate very low metabolic activity with SUV  max equal 1.8 increased from 1.2. No additional pulmonary nodules. No hypermetabolic mediastinal lymph nodes.  ABDOMEN/PELVIS  No abnormal hypermetabolic activity within the liver, pancreas, adrenal glands, or spleen. No hypermetabolic lymph nodes in the abdomen or pelvis.  SKELETON  No focal hypermetabolic activity to suggest skeletal metastasis.  IMPRESSION: Low metabolic activity associated with LEFT upper lobe pulmonary nodule with minimal increase from change from PET-CT of 06/08/2014. Adenocarcinoma identified on endobronchial sampling.  No evidence of metastatic disease.   Electronically Signed  By: Suzy Bouchard M.D.  On: 03/06/2016 15:40 I personally reviewed the CT and PET/CT concur with the findings as noted above  Pulmonary function testing 01/02/2015 FVC 2.98 (78%) FEV1 1.96 (71%) DLCO 18.33 (68%)  Impression:  70 year old man with a history of tobacco abuse and COPD who has a small slowly growing adenocarcinoma the left upper lobe. This is a stage IA lesion clinically. Options for treatment include surgery and stereotactic radiation. He will meet with Dr. Tammi Klippel later this afternoon to discuss radiation.  I discussed the surgical option with Miguel Morales and his family. I think that gives him the best chance of cure. I do think his chance for cure is reasonable with radiation, but not quite as high as with resection. Given that this appears to be a pure ground glass opacity, I think we can treat him with a  wide wedge resection and node dissection. At the most, segmentectomy could be considered.  I discussed the proposed operation with Miguel Morales has family in detail. I reviewed the indications, risks, benefits, and alternatives. We discussed the general nature of the procedure, the need for general anesthesia, the incisions to be used, use of drainage tubes postoperatively, expected hospital stay, and overall recovery. They understand the risks of surgery include, but are not limited to death, MI, DVT, PE, bleeding, possibly transfusion, stroke, infection, prolonged air leak, cardiac arrhythmias, as well as the possibility of other unforeseeable complications.  He wishes to talk with Dr. Tammi Klippel and think over his options before deciding how to proceed.  It has been a year since his pulmonary function testing, but since we are doing a limited resection and he quit smoking several years ago I do not think that needs to be repeated.  Plan: Patient will inform us how he would like to proceed.  If he opts for surgery we will plan to do a left VATS, wedge resection, and lymph node dissection.  Melrose Nakayama, MD [facility] [phone]

## 2016-03-19 NOTE — Progress Notes (Signed)
Radiation Oncology         [phone] ________________________________  Multidisciplinary Thoracic Oncology Clinic [facility]) Initial Outpatient Consultation  Name: Miguel Morales MRN: [id number]  Date: 03/19/2016  DOB: 25-Mar-1946  RW:ERXVQ Wolfgang Phoenix, MD  [name], MD   REFERRING PHYSICIAN: [name], MD  DIAGNOSIS: The encounter diagnosis was Non-small cell carcinoma of left lung, stage 1 ([place]).    ICD-9-CM ICD-10-CM   1. Non-small cell carcinoma of left lung, stage 1 (HCC) 162.9 C34.92     HISTORY OF PRESENT ILLNESS:Miguel Morales who goes by "[name]," is a 70 y.o. male with a history of COPD, and has been followed for a pulmonary nodule which was identified on CT scan in March 2015 when the patient had been evaluated for increasing shortness of breath. There was a 4 mm right lower lobe nodule, and a 10 x 14 mm ground glass nodule seen in the left upper lobe. A repeat CT scan with high resolution and contrast revealed stable changes in the right lower lobe nodule still measuring 4 mm, and stable change of the left upper lobe ground glass nodule. A PET scan on 06/08/2014 revealed persistent left upper lobe ground glass density with low level hypermetabolic change. He was offered further investigation into this lesion versus continued surveillance which he opted for. This is followed on repeat CT scan in November 2015, and September 2016 , both of which showed stable findings. A repeat scan on 01/29/2016 revealed this lesion measured 14.5 x 11.5 mm. He did undergo navigational bronchoscopy on 02/26/2016, and the FNA revealed a small focus of atypical change consistent with low-grade adenocarcinoma. He has met with cardiology as well in lieu of possibly undergoing surgery. He did have a Cardiolite procedure in 2015 which was completely normal. He also had what appears to be a stress test done nuclear medicine on 01/17/2016 with an EF of 61%, with medium defect of mild severity in the mid  inferior and apical inferior location no ST segment change was noted during stress. The patient was referred today for presentation in the multidisciplinary conference. Radiology studies and pathology slides were presented there for review and discussion of treatment options. He has met with Dr. Roxan Hockey, and is contemplating wedge resection. He is seen today by Dr. Tammi Klippel for further discussion of the role of SBRT if he elects for radiation rather than surgical intervention.  PREVIOUS RADIATION THERAPY: No  PAST MEDICAL HISTORY:  Past Medical History  Diagnosis Date  . COPD (chronic obstructive pulmonary disease) ([place])   . Mixed hyperlipidemia   . GERD (gastroesophageal reflux disease)   . Cancer ([place])   . Asthma   . Spinal stenosis   . Shortness of breath dyspnea     with exertion  . Hypertension   . Sleep apnea     will sometimes wear a CPAP  . History of bronchitis   . Nocturia   . Arthritis     PAST SURGICAL HISTORY: Past Surgical History  Procedure Laterality Date  . Knee arthroscopy Right 2004  . Septoplasty    . Circumcision    . Colonoscopy  03/04/2004    MGQ:[id number] diverticula.  The remainder of the colonic mucosa appeared normal/normal rectum  . Colonoscopy  05/22/2009    OIZ:TIWPYKD mucosa appeared normal/Left-sided diverticula/Normal rectum  . Colonoscopy N/A 08/02/2014    Procedure: COLONOSCOPY;  Surgeon: Daneil Dolin, MD;  [facility];  Service: Endoscopy;  Laterality: N/A;  2:00 PM  .  Video bronchoscopy with endobronchial navigation N/A 02/26/2016    Procedure: VIDEO BRONCHOSCOPY WITH ENDOBRONCHIAL NAVIGATION LEFT UPPER LOBE LUNG NODULE;  Surgeon: [name], MD;  Location: [facility];  Service: Thoracic;  Laterality: N/A;    FAMILY HISTORY:  Family History  Problem Relation Age of Onset  . Cancer Mother     SOCIAL HISTORY:  reports that he quit smoking about 12 years ago. His smoking use included Cigarettes. He started smoking about 57  years ago. He has a 30 pack-year smoking history. He has never used smokeless tobacco. He reports that he drinks alcohol. He reports that he does not use illicit drugs. The patient is married, and lives in [place], [place].  ALLERGIES: Sulfa antibiotics  MEDICATIONS:  Current Outpatient Prescriptions  Medication Sig Dispense Refill  . acetaminophen (TYLENOL) 500 MG tablet Take 500 mg by mouth every 6 (six) hours as needed.    Marland Kitchen albuterol (PROVENTIL HFA;VENTOLIN HFA) 108 (90 Base) MCG/ACT inhaler Inhale 2 puffs into the lungs every 6 (six) hours as needed for wheezing. 3 Inhaler 2  . amLODipine (NORVASC) 10 MG tablet Take 1 tablet (10 mg total) by mouth daily. 90 tablet 1  . Ascorbic Acid (VITAMIN C) 1000 MG tablet Take 1,000 mg by mouth daily.    Marland Kitchen aspirin 81 MG tablet Take 81 mg by mouth daily.    Marland Kitchen atorvastatin (LIPITOR) 40 MG tablet Take 1 tablet (40 mg total) by mouth daily. 90 tablet 1  . budesonide-formoterol (SYMBICORT) 160-4.5 MCG/ACT inhaler Inhale 2 puffs into the lungs 2 (two) times daily.    . Coenzyme Q10 (COQ10) 400 MG CAPS Take 1 capsule by mouth daily.     . Flaxseed, Linseed, (FLAXSEED OIL) 1000 MG CAPS Take 1 capsule by mouth daily.    . fluticasone (FLONASE) 50 MCG/ACT nasal spray USE 1 SPRAY IN EACH NOSTRIL DAILY AS NEEDED 16 g 5  . ibuprofen (ADVIL,MOTRIN) 200 MG tablet Take 800 mg by mouth 2 (two) times daily as needed.     Marland Kitchen ketotifen (ZADITOR) 0.025 % ophthalmic solution Place 1 drop into both eyes 2 (two) times daily as needed.    . lidocaine (XYLOCAINE) 5 % ointment Apply 1 application topically as needed for mild pain (For Back and hip).     . Loratadine 10 MG CAPS Take by mouth daily. PRN    . mometasone (ELOCON) 0.1 % lotion APPLY TOPICALLY DAILY 60 mL 3  . omeprazole (PRILOSEC) 20 MG capsule TAKE 1 CAPSULE DAILY AS NEEDED 90 capsule 1  . SPIRIVA HANDIHALER 18 MCG inhalation capsule INHALE THE CONTENTS OF 1 CAPSULE DAILY 90 capsule 1  . tamsulosin (FLOMAX)  0.4 MG CAPS capsule Take 1 tablet by mouth at bedtime. 90 capsule 1  . triamcinolone (KENALOG) 0.025 % cream Apply 1 application topically 2 (two) times daily.    . Turmeric Curcumin 500 MG CAPS Take 1 capsule by mouth daily.     No current facility-administered medications for this encounter.   REVIEW OF SYSTEMS:  On review of systems, the patient reports that he is doing well overall. He denies any chest pain, shortness of breath, cough, fevers, chills, night sweats, unintended weight changes. He denies any bowel or bladder disturbances, and denies abdominal pain, nausea or vomiting. He denies any new musculoskeletal or joint aches or pains. A complete review of systems is obtained and is otherwise negative.    PHYSICAL EXAM: BP: 113/76 mmHg, Pulse: 97, Resp: 18, Temp: 98.4 F, SpO2: 98 %,  Weight 212 lb 9.6 oz, Height: _0  (1.676 m)  In general this is a well appearing Caucasian male in no acute distress. He is alert and oriented x4 and appropriate throughout the examination. HEENT reveals that the patient is normocephalic, atraumatic. EOMs are intact. PERRLA. Skin is intact without any evidence of gross lesions. Cardiovascular exam reveals a regular rate and rhythm, no clicks rubs or murmurs are auscultated. Chest is clear to auscultation bilaterally. Lymphatic assessment is performed and does not reveal any adenopathy in the cervical, supraclavicular, axillary, or inguinal chains. Abdomen has active bowel sounds in all quadrants and is intact. The abdomen is soft, non tender, non distended. Lower extremities are negative for pretibial pitting edema, deep calf tenderness, cyanosis or clubbing.  KPS = 100 100 - Normal; no complaints; no evidence of disease. 90   - Able to carry on normal activity; minor signs or symptoms of disease. 80   - Normal activity with effort; some signs or symptoms of disease. 15   - Cares for self; unable to carry on normal activity or to do active work. 60   -  Requires occasional assistance, but is able to care for most of his personal needs. 50   - Requires considerable assistance and frequent medical care. 38   - Disabled; requires special care and assistance. 42   - Severely disabled; hospital admission is indicated although death not imminent. 8   - Very sick; hospital admission necessary; active supportive treatment necessary. 10   - Moribund; fatal processes progressing rapidly. 0     - Dead  Karnofsky DA, Abelmann [place], Craver LS and Burchenal [facility] [place] [phone]) The use of the nitrogen mustards in the palliative treatment of carcinoma: with particular reference to bronchogenic carcinoma Cancer 1 634-56  LABORATORY DATA:  Lab Results  Component Value Date   WBC 7.5 02/20/2016   HGB 14.1 02/20/2016   HCT 42.6 02/20/2016   MCV 93.6 02/20/2016   PLT 265 02/20/2016   Lab Results  Component Value Date   NA 144 02/20/2016   K 3.6 02/20/2016   CL 107 02/20/2016   CO2 24 02/20/2016   Lab Results  Component Value Date   ALT 19 02/20/2016   AST 24 02/20/2016   ALKPHOS 75 02/20/2016   BILITOT 0.8 02/20/2016    RADIOGRAPHY: Nm Pet Image Restag (ps) Skull Base To Thigh  03/06/2016  CLINICAL DATA:  Subsequent treatment strategy for LEFT lung adenocarcinoma. EXAM: NUCLEAR MEDICINE PET SKULL BASE TO THIGH TECHNIQUE: 10.4 mCi F-18 FDG was injected intravenously. Full-ring PET imaging was performed from the skull base to thigh after the radiotracer. CT data was obtained and used for attenuation correction and anatomic localization. FASTING BLOOD GLUCOSE:  Value: 85 mg/dl COMPARISON:  CT 01/29/2016, 10/02/2014, PET-CT 06/08/2014. FINDINGS: NECK No hypermetabolic lymph nodes in the neck. CHEST Ground-glass nodule in the LEFT upper lobe measuring 11 mm x 16 mm compares to 10 mm x 15 mm PET-CT 06/08/2014 for no significant change. Lesion continues demonstrate very low metabolic activity with SUV max equal 1.8 increased from 1.2. No additional pulmonary nodules.  No hypermetabolic mediastinal lymph nodes. ABDOMEN/PELVIS No abnormal hypermetabolic activity within the liver, pancreas, adrenal glands, or spleen. No hypermetabolic lymph nodes in the abdomen or pelvis. SKELETON No focal hypermetabolic activity to suggest skeletal metastasis. IMPRESSION: Low metabolic activity associated with LEFT upper lobe pulmonary nodule with minimal increase from change from PET-CT of 06/08/2014. Adenocarcinoma identified on endobronchial sampling. No evidence of metastatic disease. Electronically  Signed   By: Suzy Bouchard M.D.   On: 03/06/2016 15:40   Dg Chest Port 1 View  02/26/2016  CLINICAL DATA:  Status post bronchoscopy with biopsy EXAM: PORTABLE CHEST 1 VIEW COMPARISON:  01/29/2016 FINDINGS: Cardiac shadow is at the upper limits of normal in size. No focal infiltrate or sizable effusion is seen. No acute bony abnormality is noted. No pneumothorax is noted. IMPRESSION: No post biopsy pneumothorax is noted. Electronically Signed   By: Inez Catalina M.D.   On: 02/26/2016 11:05   Dg C-arm Bronchoscopy  02/26/2016  CLINICAL DATA:  C-ARM BRONCHOSCOPY Fluoroscopy was utilized by the requesting physician.  No radiographic interpretation.      IMPRESSION:  70 y.o. male with what appears to be stage I , non-small cell lung cancer, adenocarcinoma of the left upper lobe.   PLAN: Dr. Tammi Klippel discusses the findings on the patient's imaging studies and wheezes pathology. Discussed that the standard of care for patients were held surgical intervention is 4 resection. That being said patients who are unable to undergo surgical intervention, or atelectasis is not forward surgically, SBRT would be an alternative. We have discussed with the patientdelivery, in planning logistics for radiation therapy, and have reviewed the risks, benefits, short and long-term side effects of the treatment. At the end of the conversation the patient is not quite sure which direction he is interested in moving  but is more heavily contemplating surgery. I will touch base with him by phone next week to see what he has decided for moving forward. We would be happy to see him back in the future should he decide to move forward with SBRT, or if he has additional needs for radiation oncology treatment in the future.   The above documentation reflects my direct findings during this shared patient visit. Please see the separate note by Dr. Tammi Klippel on this date for the remainder of the patient's plan of care.    Carola Rhine, PAC  This document serves as a record of services personally performed by Shona Simpson, PA and Tyler Pita, MD. It was created on their behalf by Jenell Milliner, a trained medical scribe. The creation of this record is based on the scribe's personal observations and the provider's statements to them. This document has been checked and approved by the attending provider.

## 2016-03-19 NOTE — Progress Notes (Signed)
[facility] Clinical Social Work  Clinical Social Work met with patient/family at [employer] appointment to offer support and assess for psychosocial needs.  Miguel Morales is meeting with thoracic surgeon and radiation oncologist.  The patient shared he would like to meet with both physicians and then decide whether he will proceed with radiation or surgery.  Patient reported he had adequate support from family. He also shared he is active and enjoys working outside.  Clinical Social Work briefly discussed Clinical Social Work role and [employer] support programs/services.  Clinical Social Work encouraged patient to call with any additional questions or concerns.   Polo Riley, MSW, LCSW, OSW-C Clinical Social Worker [facility] [phone]

## 2016-03-23 ENCOUNTER — Other Ambulatory Visit: Payer: Self-pay | Admitting: *Deleted

## 2016-03-23 DIAGNOSIS — R911 Solitary pulmonary nodule: Secondary | ICD-10-CM

## 2016-03-26 LAB — FUNGUS CULTURE RESULT

## 2016-03-26 LAB — FUNGUS CULTURE WITH STAIN

## 2016-03-26 LAB — FUNGAL ORGANISM REFLEX

## 2016-04-03 ENCOUNTER — Encounter (HOSPITAL_COMMUNITY): Payer: Self-pay

## 2016-04-03 ENCOUNTER — Encounter (HOSPITAL_COMMUNITY)
Admission: RE | Admit: 2016-04-03 | Discharge: 2016-04-03 | Disposition: A | Discharge status: Home / Self Care | Payer: Medicare Other | Source: Ambulatory Visit | Attending: Thoracic Surgery (Cardiothoracic Vascular Surgery) | Admitting: Thoracic Surgery (Cardiothoracic Vascular Surgery)

## 2016-04-03 ENCOUNTER — Ambulatory Visit (HOSPITAL_COMMUNITY): Admission: RE | Admit: 2016-04-03 | Payer: Medicare Other | Source: Ambulatory Visit

## 2016-04-03 VITALS — BP 154/80 | HR 107 | Temp 97.1°F | Resp 20 | Wt 212.1 lb

## 2016-04-03 DIAGNOSIS — R911 Solitary pulmonary nodule: Secondary | ICD-10-CM | POA: Diagnosis not present

## 2016-04-03 DIAGNOSIS — Z79899 Other long term (current) drug therapy: Secondary | ICD-10-CM | POA: Insufficient documentation

## 2016-04-03 DIAGNOSIS — Z7982 Long term (current) use of aspirin: Secondary | ICD-10-CM | POA: Diagnosis not present

## 2016-04-03 DIAGNOSIS — I10 Essential (primary) hypertension: Secondary | ICD-10-CM | POA: Diagnosis not present

## 2016-04-03 DIAGNOSIS — Z01818 Encounter for other preprocedural examination: Secondary | ICD-10-CM | POA: Diagnosis not present

## 2016-04-03 DIAGNOSIS — K219 Gastro-esophageal reflux disease without esophagitis: Secondary | ICD-10-CM | POA: Insufficient documentation

## 2016-04-03 DIAGNOSIS — Z01812 Encounter for preprocedural laboratory examination: Secondary | ICD-10-CM | POA: Insufficient documentation

## 2016-04-03 DIAGNOSIS — Z87891 Personal history of nicotine dependence: Secondary | ICD-10-CM | POA: Diagnosis not present

## 2016-04-03 DIAGNOSIS — Z0183 Encounter for blood typing: Secondary | ICD-10-CM | POA: Insufficient documentation

## 2016-04-03 DIAGNOSIS — G4733 Obstructive sleep apnea (adult) (pediatric): Secondary | ICD-10-CM | POA: Insufficient documentation

## 2016-04-03 DIAGNOSIS — J449 Chronic obstructive pulmonary disease, unspecified: Secondary | ICD-10-CM | POA: Insufficient documentation

## 2016-04-03 DIAGNOSIS — R000 Tachycardia, unspecified: Secondary | ICD-10-CM | POA: Diagnosis not present

## 2016-04-03 DIAGNOSIS — E785 Hyperlipidemia, unspecified: Secondary | ICD-10-CM | POA: Diagnosis not present

## 2016-04-03 LAB — URINALYSIS, ROUTINE W REFLEX MICROSCOPIC
Bilirubin Urine: NEGATIVE
Glucose, UA: NEGATIVE mg/dL
Hgb urine dipstick: NEGATIVE
KETONES UR: 15 mg/dL — AB
LEUKOCYTES UA: NEGATIVE
NITRITE: NEGATIVE
PROTEIN: NEGATIVE mg/dL
Specific Gravity, Urine: 1.027 (ref 1.005–1.030)
pH: 6.5 (ref 5.0–8.0)

## 2016-04-03 LAB — COMPREHENSIVE METABOLIC PANEL
ALBUMIN: 4 g/dL (ref 3.5–5.0)
ALT: 21 U/L (ref 17–63)
AST: 25 U/L (ref 15–41)
Alkaline Phosphatase: 77 U/L (ref 38–126)
Anion gap: 9 (ref 5–15)
BUN: 13 mg/dL (ref 6–20)
CHLORIDE: 107 mmol/L (ref 101–111)
CO2: 22 mmol/L (ref 22–32)
CREATININE: 1.13 mg/dL (ref 0.61–1.24)
Calcium: 9.4 mg/dL (ref 8.9–10.3)
GFR calc Af Amer: >60 mL/min (ref >60)
GLUCOSE: 171 mg/dL — AB (ref 65–99)
Potassium: 3.2 mmol/L — ABNORMAL LOW (ref 3.5–5.1)
Sodium: 138 mmol/L (ref 135–145)
Total Bilirubin: 0.9 mg/dL (ref 0.3–1.2)
Total Protein: 6.6 g/dL (ref 6.5–8.1)

## 2016-04-03 LAB — CBC
HCT: 39.8 % (ref 39.0–52.0)
Hemoglobin: 13.2 g/dL (ref 13.0–17.0)
MCH: 30.3 pg (ref 26.0–34.0)
MCHC: 33.2 g/dL (ref 30.0–36.0)
MCV: 91.5 fL (ref 78.0–100.0)
PLATELETS: 267 10*3/uL (ref 150–400)
RBC: 4.35 MIL/uL (ref 4.22–5.81)
RDW: 13 % (ref 11.5–15.5)
WBC: 7.7 10*3/uL (ref 4.0–10.5)

## 2016-04-03 LAB — TYPE AND SCREEN
ABO/RH(D): O POS
Antibody Screen: NEGATIVE

## 2016-04-03 LAB — APTT: aPTT: 29 seconds (ref 24–37)

## 2016-04-03 LAB — BLOOD GAS, ARTERIAL
Acid-Base Excess: 1.2 mmol/L (ref 0.0–2.0)
Bicarbonate: 24.7 mEq/L — ABNORMAL HIGH (ref 20.0–24.0)
DRAWN BY: 421801
FIO2: 0.21
O2 Saturation: 96.3 %
PH ART: 7.464 — AB (ref 7.350–7.450)
Patient temperature: 98.6
TCO2: 25.7 mmol/L (ref 0–100)
pCO2 arterial: 34.8 mmHg — ABNORMAL LOW (ref 35.0–45.0)
pO2, Arterial: 83.7 mmHg (ref 80.0–100.0)

## 2016-04-03 LAB — PROTIME-INR
INR: 1.14 (ref 0.00–1.49)
Prothrombin Time: 14.7 seconds (ref 11.6–15.2)

## 2016-04-03 LAB — ABO/RH: ABO/RH(D): O POS

## 2016-04-03 LAB — SURGICAL PCR SCREEN
MRSA, PCR: NEGATIVE
STAPHYLOCOCCUS AUREUS: NEGATIVE

## 2016-04-03 NOTE — Progress Notes (Addendum)
Anesthesia Chart Review:  Pt is a 70 year old male scheduled for VATS/wedge resection, lymph node dissection on 04/08/2016 with Dr. Roxan Hockey.   PCP is Dr. Sallee Lange. Was seen by Dr. Bronson Ing for chest pain 02/18/16, stress and echo results below; follow up prn recommended.   PMH includes: HTN, hyperlipidemia, COPD, asthma, OSA, GERD. Former smoker. BMI 34  Medications include: albuterol, amlodipine, ASA, lipitor, symbicort, prilosec, spiriva  Preoperative labs reviewed.   CT chest 01/29/16:  1. Stable left upper lobe ground-glass nodule measuring 14.5 x 11.5 mm. This has increased slightly in size since the regional chest CT in March 2015.  2. Stable emphysematous changes. No acute pulmonary findings. No new pulmonary lesions. 3. Stable atherosclerotic calcifications involving the aorta and branch vessels including the Coronary arteries.  EKG 04/03/16: sinus tachycardia (101 bpm)  Nuclear stress test 01/17/16:   Defect 1: There is a medium defect of mild severity present in the mid inferior and apical inferior location. This is due to soft tissue attenuation artifact.  This is a low risk study.  Nuclear stress EF: 61%.  There was no ST segment deviation noted during stress.  Echo 01/16/16:  - Left ventricle: The cavity size was normal. Wall thickness was increased in a pattern of mild LVH. Systolic function was vigorous. The estimated ejection fraction was in the range of 65% to 70%. Wall motion was normal; there were no regional wall motion abnormalities. Doppler parameters are consistent with abnormal left ventricular relaxation (grade 1 diastolic dysfunction). - Aortic valve: A bicuspid morphology cannot be excluded. There was no significant regurgitation. - Mitral valve: There was trivial regurgitation. - Atrial septum: No defect or patent foramen ovale was identified. - Tricuspid valve: There was physiologic regurgitation. - Pulmonary arteries: Systolic pressure could  not be accurately estimated.  If no changes, I anticipate pt can proceed with surgery as scheduled.   Willeen Cass, FNP-BC [facility]/Anesthesiology Phone: [phone] 04/03/2016 4:38 PM

## 2016-04-03 NOTE — Pre-Procedure Instructions (Signed)
    Miguel Morales  04/03/2016      [place], [address] [place] [address] Phone: [phone] Fax: [phone]  [facility] [place], [place] [address] [place] [address] Phone: [phone] Fax: [place], [place] [address] [place] [address] Phone: [phone] Fax: [phone]    Your procedure is scheduled on 04/08/16.  Report to [facility] Admitting at 630 A.M.  Call this number if you have problems the morning of surgery:  [phone]   Remember:  Do not eat food or drink liquids after midnight.  Take these medicines the morning of surgery with A SIP OF WATER --tylenol,all inhalers,amlodipine,flomax   Do not wear jewelry, make-up or nail polish.  Do not wear lotions, powders, or perfumes.  You may wear deodorant.  Do not shave 48 hours prior to surgery.  Men may shave face and neck.  Do not bring valuables to the hospital.  [facility] is not responsible for any belongings or valuables.  Contacts, dentures or bridgework may not be worn into surgery.  Leave your suitcase in the car.  After surgery it may be brought to your room.  For patients admitted to the hospital, discharge time will be determined by your treatment team.  Patients discharged the day of surgery will not be allowed to drive home.   Name and phone number of your driver:    Special instructions:    Please read over the following fact sheets that you were given. MRSA Information

## 2016-04-05 MED ORDER — DEXTROSE 5 % IV SOLN: 1.5000 g | INTRAVENOUS | Status: DC

## 2016-04-07 MED ORDER — DEXTROSE 5 % IV SOLN
1.5000 g | INTRAVENOUS | Status: AC
  Administered 2016-04-08: 1.5 g via INTRAVENOUS
  Filled 2016-04-07: qty 1.5

## 2016-04-08 ENCOUNTER — Encounter (HOSPITAL_COMMUNITY): Payer: Self-pay | Admitting: Certified Registered Nurse Anesthetist

## 2016-04-08 ENCOUNTER — Inpatient Hospital Stay (HOSPITAL_COMMUNITY): Payer: Medicare Other

## 2016-04-08 ENCOUNTER — Inpatient Hospital Stay (HOSPITAL_COMMUNITY): Payer: Medicare Other | Admitting: Certified Registered"

## 2016-04-08 ENCOUNTER — Encounter (HOSPITAL_COMMUNITY)
Admission: RE | Disposition: A | Discharge status: Home / Self Care | Payer: Self-pay | Source: Ambulatory Visit | Attending: Thoracic Surgery (Cardiothoracic Vascular Surgery)

## 2016-04-08 ENCOUNTER — Inpatient Hospital Stay (HOSPITAL_COMMUNITY)
Admission: RE | Admit: 2016-04-08 | Discharge: 2016-04-13 | DRG: 165 | Disposition: A | Discharge status: Home / Self Care | Payer: Medicare Other | Source: Ambulatory Visit | Attending: Thoracic Surgery (Cardiothoracic Vascular Surgery) | Admitting: Thoracic Surgery (Cardiothoracic Vascular Surgery)

## 2016-04-08 DIAGNOSIS — J449 Chronic obstructive pulmonary disease, unspecified: Secondary | ICD-10-CM | POA: Diagnosis present

## 2016-04-08 DIAGNOSIS — M1990 Unspecified osteoarthritis, unspecified site: Secondary | ICD-10-CM | POA: Diagnosis not present

## 2016-04-08 DIAGNOSIS — Z4682 Encounter for fitting and adjustment of non-vascular catheter: Secondary | ICD-10-CM | POA: Diagnosis not present

## 2016-04-08 DIAGNOSIS — J984 Other disorders of lung: Secondary | ICD-10-CM | POA: Diagnosis not present

## 2016-04-08 DIAGNOSIS — R911 Solitary pulmonary nodule: Secondary | ICD-10-CM | POA: Diagnosis not present

## 2016-04-08 DIAGNOSIS — I10 Essential (primary) hypertension: Secondary | ICD-10-CM | POA: Diagnosis present

## 2016-04-08 DIAGNOSIS — E782 Mixed hyperlipidemia: Secondary | ICD-10-CM | POA: Diagnosis not present

## 2016-04-08 DIAGNOSIS — C3412 Malignant neoplasm of upper lobe, left bronchus or lung: Secondary | ICD-10-CM | POA: Diagnosis not present

## 2016-04-08 DIAGNOSIS — Z7982 Long term (current) use of aspirin: Secondary | ICD-10-CM

## 2016-04-08 DIAGNOSIS — Z87891 Personal history of nicotine dependence: Secondary | ICD-10-CM | POA: Diagnosis not present

## 2016-04-08 DIAGNOSIS — M4800 Spinal stenosis, site unspecified: Secondary | ICD-10-CM | POA: Diagnosis present

## 2016-04-08 DIAGNOSIS — D649 Anemia, unspecified: Secondary | ICD-10-CM | POA: Diagnosis not present

## 2016-04-08 DIAGNOSIS — K219 Gastro-esophageal reflux disease without esophagitis: Secondary | ICD-10-CM | POA: Diagnosis present

## 2016-04-08 DIAGNOSIS — E669 Obesity, unspecified: Secondary | ICD-10-CM | POA: Diagnosis present

## 2016-04-08 DIAGNOSIS — J939 Pneumothorax, unspecified: Secondary | ICD-10-CM

## 2016-04-08 DIAGNOSIS — J90 Pleural effusion, not elsewhere classified: Secondary | ICD-10-CM | POA: Diagnosis not present

## 2016-04-08 DIAGNOSIS — G4730 Sleep apnea, unspecified: Secondary | ICD-10-CM | POA: Diagnosis present

## 2016-04-08 DIAGNOSIS — Z6834 Body mass index (BMI) 34.0-34.9, adult: Secondary | ICD-10-CM | POA: Diagnosis not present

## 2016-04-08 DIAGNOSIS — Z79899 Other long term (current) drug therapy: Secondary | ICD-10-CM | POA: Diagnosis not present

## 2016-04-08 DIAGNOSIS — I517 Cardiomegaly: Secondary | ICD-10-CM | POA: Diagnosis not present

## 2016-04-08 DIAGNOSIS — Z7951 Long term (current) use of inhaled steroids: Secondary | ICD-10-CM

## 2016-04-08 DIAGNOSIS — R7303 Prediabetes: Secondary | ICD-10-CM | POA: Diagnosis present

## 2016-04-08 DIAGNOSIS — Z01818 Encounter for other preprocedural examination: Secondary | ICD-10-CM | POA: Diagnosis not present

## 2016-04-08 DIAGNOSIS — R0602 Shortness of breath: Secondary | ICD-10-CM

## 2016-04-08 DIAGNOSIS — J60 Coalworker's pneumoconiosis: Secondary | ICD-10-CM | POA: Diagnosis not present

## 2016-04-08 DIAGNOSIS — Z902 Acquired absence of lung [part of]: Secondary | ICD-10-CM

## 2016-04-08 HISTORY — PX: VIDEO ASSISTED THORACOSCOPY (VATS)/WEDGE RESECTION: SHX6174

## 2016-04-08 HISTORY — PX: LYMPH NODE DISSECTION: SHX5087

## 2016-04-08 LAB — GLUCOSE, CAPILLARY
GLUCOSE-CAPILLARY: 117 mg/dL — AB (ref 65–99)
GLUCOSE-CAPILLARY: 144 mg/dL — AB (ref 65–99)

## 2016-04-08 SURGERY — VIDEO ASSISTED THORACOSCOPY (VATS)/WEDGE RESECTION
Anesthesia: General | Site: Chest

## 2016-04-08 MED ORDER — FENTANYL 40 MCG/ML IV SOLN
INTRAVENOUS | Status: AC
  Filled 2016-04-08: qty 25

## 2016-04-08 MED ORDER — BUPIVACAINE HCL (PF) 0.5 % IJ SOLN
INTRAMUSCULAR | Status: DC | PRN
  Administered 2016-04-08: 10 mL

## 2016-04-08 MED ORDER — SUGAMMADEX SODIUM 200 MG/2ML IV SOLN
INTRAVENOUS | Status: DC | PRN
  Administered 2016-04-08: 200 mg via INTRAVENOUS

## 2016-04-08 MED ORDER — ATORVASTATIN CALCIUM 40 MG PO TABS
40.0000 mg | ORAL_TABLET | Freq: Every day | ORAL | Status: DC
  Administered 2016-04-08 – 2016-04-13 (×6): 40 mg via ORAL
  Filled 2016-04-08 (×6): qty 1

## 2016-04-08 MED ORDER — OXYCODONE HCL 5 MG PO TABS: 5.0000 mg | ORAL_TABLET | Freq: Once | ORAL | Status: DC | PRN

## 2016-04-08 MED ORDER — PROPOFOL 10 MG/ML IV BOLUS
INTRAVENOUS | Status: AC
  Filled 2016-04-08: qty 20

## 2016-04-08 MED ORDER — INSULIN ASPART 100 UNIT/ML ~~LOC~~ SOLN
0.0000 [IU] | Freq: Four times a day (QID) | SUBCUTANEOUS | Status: DC
  Administered 2016-04-08: 2 [IU] via SUBCUTANEOUS
  Administered 2016-04-08: 4 [IU] via SUBCUTANEOUS

## 2016-04-08 MED ORDER — PHENYLEPHRINE HCL 10 MG/ML IJ SOLN
INTRAMUSCULAR | Status: DC | PRN
  Administered 2016-04-08: 40 ug via INTRAVENOUS
  Administered 2016-04-08: 80 ug via INTRAVENOUS
  Administered 2016-04-08 (×2): 40 ug via INTRAVENOUS

## 2016-04-08 MED ORDER — CEFUROXIME SODIUM 1.5 G IJ SOLR
1.5000 g | Freq: Two times a day (BID) | INTRAMUSCULAR | Status: AC
  Administered 2016-04-08 – 2016-04-09 (×2): 1.5 g via INTRAVENOUS
  Filled 2016-04-08 (×3): qty 1.5

## 2016-04-08 MED ORDER — ONDANSETRON HCL 4 MG/2ML IJ SOLN: 4.0000 mg | Freq: Four times a day (QID) | INTRAMUSCULAR | Status: DC | PRN

## 2016-04-08 MED ORDER — DIPHENHYDRAMINE HCL 50 MG/ML IJ SOLN: 12.5000 mg | Freq: Four times a day (QID) | INTRAMUSCULAR | Status: DC | PRN

## 2016-04-08 MED ORDER — ACETAMINOPHEN 160 MG/5ML PO SOLN
325.0000 mg | ORAL | Status: DC | PRN
  Filled 2016-04-08: qty 20.3

## 2016-04-08 MED ORDER — LACTATED RINGERS IV SOLN
INTRAVENOUS | Status: DC | PRN
  Administered 2016-04-08: 08:00:00 via INTRAVENOUS

## 2016-04-08 MED ORDER — BISACODYL 5 MG PO TBEC
10.0000 mg | DELAYED_RELEASE_TABLET | Freq: Every day | ORAL | Status: DC
  Administered 2016-04-08 – 2016-04-12 (×5): 10 mg via ORAL
  Filled 2016-04-08 (×7): qty 2

## 2016-04-08 MED ORDER — AMLODIPINE BESYLATE 10 MG PO TABS
10.0000 mg | ORAL_TABLET | Freq: Every day | ORAL | Status: DC
  Administered 2016-04-09 – 2016-04-13 (×5): 10 mg via ORAL
  Filled 2016-04-08 (×5): qty 1

## 2016-04-08 MED ORDER — SUGAMMADEX SODIUM 200 MG/2ML IV SOLN
INTRAVENOUS | Status: AC
  Filled 2016-04-08: qty 2

## 2016-04-08 MED ORDER — TRAMADOL HCL 50 MG PO TABS: 50.0000 mg | ORAL_TABLET | Freq: Four times a day (QID) | ORAL | Status: DC | PRN

## 2016-04-08 MED ORDER — ACETAMINOPHEN 325 MG PO TABS: 325.0000 mg | ORAL_TABLET | ORAL | Status: DC | PRN

## 2016-04-08 MED ORDER — ROCURONIUM BROMIDE 100 MG/10ML IV SOLN
INTRAVENOUS | Status: DC | PRN
  Administered 2016-04-08: 50 mg via INTRAVENOUS
  Administered 2016-04-08: 30 mg via INTRAVENOUS
  Administered 2016-04-08: 50 mg via INTRAVENOUS

## 2016-04-08 MED ORDER — PROPOFOL 10 MG/ML IV BOLUS
INTRAVENOUS | Status: DC | PRN
  Administered 2016-04-08: 50 mg via INTRAVENOUS
  Administered 2016-04-08: 100 mg via INTRAVENOUS

## 2016-04-08 MED ORDER — LIDOCAINE HCL (CARDIAC) 20 MG/ML IV SOLN
INTRAVENOUS | Status: DC | PRN
Start: 2016-04-08 — End: 2016-04-08
  Administered 2016-04-08: 40 mg via INTRAVENOUS

## 2016-04-08 MED ORDER — ONDANSETRON HCL 4 MG/2ML IJ SOLN
4.0000 mg | Freq: Four times a day (QID) | INTRAMUSCULAR | Status: DC | PRN
Start: 2016-04-08 — End: 2016-04-11

## 2016-04-08 MED ORDER — ACETAMINOPHEN 500 MG PO TABS
1000.0000 mg | ORAL_TABLET | Freq: Four times a day (QID) | ORAL | Status: AC
  Administered 2016-04-08 – 2016-04-13 (×18): 1000 mg via ORAL
  Filled 2016-04-08 (×18): qty 2

## 2016-04-08 MED ORDER — BUPIVACAINE HCL (PF) 0.5 % IJ SOLN
INTRAMUSCULAR | Status: AC
  Filled 2016-04-08: qty 10

## 2016-04-08 MED ORDER — ONDANSETRON HCL 4 MG/2ML IJ SOLN
INTRAMUSCULAR | Status: AC
  Filled 2016-04-08: qty 2

## 2016-04-08 MED ORDER — 0.9 % SODIUM CHLORIDE (POUR BTL) OPTIME
TOPICAL | Status: DC | PRN
  Administered 2016-04-08: 1000 mL

## 2016-04-08 MED ORDER — ONDANSETRON HCL 4 MG/2ML IJ SOLN
INTRAMUSCULAR | Status: DC | PRN
  Administered 2016-04-08: 4 mg via INTRAVENOUS

## 2016-04-08 MED ORDER — HEMOSTATIC AGENTS (NO CHARGE) OPTIME
TOPICAL | Status: DC | PRN
  Administered 2016-04-08: 1 via TOPICAL

## 2016-04-08 MED ORDER — ROCURONIUM BROMIDE 50 MG/5ML IV SOLN
INTRAVENOUS | Status: AC
  Filled 2016-04-08: qty 2

## 2016-04-08 MED ORDER — FENTANYL CITRATE (PF) 250 MCG/5ML IJ SOLN
INTRAMUSCULAR | Status: AC
  Filled 2016-04-08: qty 5

## 2016-04-08 MED ORDER — ROCURONIUM BROMIDE 50 MG/5ML IV SOLN
INTRAVENOUS | Status: AC
  Filled 2016-04-08: qty 1

## 2016-04-08 MED ORDER — FENTANYL CITRATE (PF) 250 MCG/5ML IJ SOLN
INTRAMUSCULAR | Status: DC | PRN
  Administered 2016-04-08: 150 ug via INTRAVENOUS
  Administered 2016-04-08 (×2): 50 ug via INTRAVENOUS

## 2016-04-08 MED ORDER — ACETAMINOPHEN 160 MG/5ML PO SOLN: 1000.0000 mg | Freq: Four times a day (QID) | ORAL | Status: AC

## 2016-04-08 MED ORDER — FENTANYL 40 MCG/ML IV SOLN
INTRAVENOUS | Status: DC
  Administered 2016-04-08: 10 ug via INTRAVENOUS
  Administered 2016-04-08: 13:00:00 via INTRAVENOUS
  Administered 2016-04-08 – 2016-04-09 (×3): 40 ug via INTRAVENOUS
  Administered 2016-04-09: 50 ug via INTRAVENOUS
  Administered 2016-04-09: 20 ug via INTRAVENOUS
  Administered 2016-04-09: 30 ug via INTRAVENOUS
  Administered 2016-04-09: 60 ug via INTRAVENOUS
  Administered 2016-04-10: 50 ug via INTRAVENOUS
  Administered 2016-04-10: 20 ug via INTRAVENOUS
  Administered 2016-04-11 (×2): 0 ug via INTRAVENOUS

## 2016-04-08 MED ORDER — MIDAZOLAM HCL 5 MG/5ML IJ SOLN
INTRAMUSCULAR | Status: DC | PRN
  Administered 2016-04-08 (×2): 1 mg via INTRAVENOUS

## 2016-04-08 MED ORDER — NALOXONE HCL 0.4 MG/ML IJ SOLN: 0.4000 mg | INTRAMUSCULAR | Status: DC | PRN

## 2016-04-08 MED ORDER — PANTOPRAZOLE SODIUM 40 MG PO TBEC
40.0000 mg | DELAYED_RELEASE_TABLET | Freq: Every day | ORAL | Status: DC
  Administered 2016-04-08 – 2016-04-13 (×6): 40 mg via ORAL
  Filled 2016-04-08 (×6): qty 1

## 2016-04-08 MED ORDER — MOMETASONE FURO-FORMOTEROL FUM 200-5 MCG/ACT IN AERO
2.0000 | INHALATION_SPRAY | Freq: Two times a day (BID) | RESPIRATORY_TRACT | Status: DC
  Administered 2016-04-09 – 2016-04-13 (×9): 2 via RESPIRATORY_TRACT
  Filled 2016-04-08: qty 8.8

## 2016-04-08 MED ORDER — HYDROMORPHONE HCL 1 MG/ML IJ SOLN
INTRAMUSCULAR | Status: AC
  Filled 2016-04-08: qty 1

## 2016-04-08 MED ORDER — HYDROMORPHONE HCL 1 MG/ML IJ SOLN
0.2500 mg | INTRAMUSCULAR | Status: DC | PRN
  Administered 2016-04-08 (×4): 0.5 mg via INTRAVENOUS

## 2016-04-08 MED ORDER — TRIAMCINOLONE ACETONIDE 0.1 % EX CREA
TOPICAL_CREAM | Freq: Every day | CUTANEOUS | Status: DC
  Administered 2016-04-10 – 2016-04-12 (×3): via TOPICAL
  Administered 2016-04-13: 1 via TOPICAL
  Filled 2016-04-08: qty 15

## 2016-04-08 MED ORDER — OXYCODONE HCL 5 MG/5ML PO SOLN: 5.0000 mg | Freq: Once | ORAL | Status: DC | PRN

## 2016-04-08 MED ORDER — PHENYLEPHRINE 40 MCG/ML (10ML) SYRINGE FOR IV PUSH (FOR BLOOD PRESSURE SUPPORT)
PREFILLED_SYRINGE | INTRAVENOUS | Status: AC
  Filled 2016-04-08: qty 10

## 2016-04-08 MED ORDER — SODIUM CHLORIDE 0.9% FLUSH: 9.0000 mL | INTRAVENOUS | Status: DC | PRN

## 2016-04-08 MED ORDER — MOMETASONE FUROATE 0.1 % EX SOLN: Freq: Every day | CUTANEOUS | Status: DC

## 2016-04-08 MED ORDER — TAMSULOSIN HCL 0.4 MG PO CAPS
0.4000 mg | ORAL_CAPSULE | Freq: Every day | ORAL | Status: DC
  Administered 2016-04-09 – 2016-04-12 (×4): 0.4 mg via ORAL
  Filled 2016-04-08 (×4): qty 1

## 2016-04-08 MED ORDER — SENNOSIDES-DOCUSATE SODIUM 8.6-50 MG PO TABS
1.0000 | ORAL_TABLET | Freq: Every day | ORAL | Status: DC
  Administered 2016-04-08 – 2016-04-10 (×3): 1 via ORAL
  Filled 2016-04-08 (×3): qty 1

## 2016-04-08 MED ORDER — LACTATED RINGERS IV SOLN
INTRAVENOUS | Status: DC | PRN
Start: 2016-04-08 — End: 2016-04-08
  Administered 2016-04-08 (×2): via INTRAVENOUS

## 2016-04-08 MED ORDER — MIDAZOLAM HCL 2 MG/2ML IJ SOLN
INTRAMUSCULAR | Status: AC
  Filled 2016-04-08: qty 2

## 2016-04-08 MED ORDER — DEXTROSE 5 % IV SOLN
10.0000 mg | INTRAVENOUS | Status: DC | PRN
  Administered 2016-04-08: 40 ug/min via INTRAVENOUS

## 2016-04-08 MED ORDER — OXYCODONE HCL 5 MG PO TABS: 5.0000 mg | ORAL_TABLET | ORAL | Status: DC | PRN

## 2016-04-08 MED ORDER — ASPIRIN EC 81 MG PO TBEC
81.0000 mg | DELAYED_RELEASE_TABLET | Freq: Every day | ORAL | Status: DC
  Administered 2016-04-09 – 2016-04-13 (×5): 81 mg via ORAL
  Filled 2016-04-08 (×6): qty 1

## 2016-04-08 MED ORDER — BUPIVACAINE 0.5 % ON-Q PUMP SINGLE CATH 400 ML
400.0000 mL | INJECTION | Status: DC
  Administered 2016-04-08: 400 mL
  Filled 2016-04-08: qty 400

## 2016-04-08 MED ORDER — DIPHENHYDRAMINE HCL 12.5 MG/5ML PO ELIX: 12.5000 mg | ORAL_SOLUTION | Freq: Four times a day (QID) | ORAL | Status: DC | PRN

## 2016-04-08 MED ORDER — CETYLPYRIDINIUM CHLORIDE 0.05 % MT LIQD
7.0000 mL | Freq: Two times a day (BID) | OROMUCOSAL | Status: DC
  Administered 2016-04-08 – 2016-04-13 (×10): 7 mL via OROMUCOSAL

## 2016-04-08 MED ORDER — KCL IN DEXTROSE-NACL 20-5-0.9 MEQ/L-%-% IV SOLN
INTRAVENOUS | Status: DC
  Administered 2016-04-08 – 2016-04-09 (×3): via INTRAVENOUS
  Filled 2016-04-08 (×7): qty 1000

## 2016-04-08 MED ORDER — POTASSIUM CHLORIDE 10 MEQ/50ML IV SOLN: 10.0000 meq | Freq: Every day | INTRAVENOUS | Status: DC | PRN

## 2016-04-08 MED ORDER — TIOTROPIUM BROMIDE MONOHYDRATE 18 MCG IN CAPS
1.0000 | ORAL_CAPSULE | Freq: Every day | RESPIRATORY_TRACT | Status: DC
  Administered 2016-04-09 – 2016-04-13 (×5): 18 ug via RESPIRATORY_TRACT
  Filled 2016-04-08: qty 5

## 2016-04-08 SURGICAL SUPPLY — 99 items
ADH SKN CLS APL DERMABOND .7 (GAUZE/BANDAGES/DRESSINGS) ×2
APL SKNCLS STERI-STRIP NONHPOA (GAUZE/BANDAGES/DRESSINGS) ×2
BAG SPEC RTRVL LRG 6X4 10 (ENDOMECHANICALS) ×2
BENZOIN TINCTURE PRP APPL 2/3 (GAUZE/BANDAGES/DRESSINGS) ×4 IMPLANT
CANISTER SUCTION 2500CC (MISCELLANEOUS) ×6 IMPLANT
CATH KIT ON Q 5IN SLV (PAIN MANAGEMENT) ×2 IMPLANT
CATH THORACIC 28FR (CATHETERS) IMPLANT
CATH THORACIC 36FR (CATHETERS) IMPLANT
CATH THORACIC 36FR RT ANG (CATHETERS) IMPLANT
CLIP TI MEDIUM 6 (CLIP) ×4 IMPLANT
CONN ST 1/4X3/8  BEN (MISCELLANEOUS) ×2
CONN ST 1/4X3/8 BEN (MISCELLANEOUS) IMPLANT
CONN Y 3/8X3/8X3/8  BEN (MISCELLANEOUS)
CONN Y 3/8X3/8X3/8 BEN (MISCELLANEOUS) IMPLANT
CONT SPEC 4OZ CLIKSEAL STRL BL (MISCELLANEOUS) ×16 IMPLANT
COVER SURGICAL LIGHT HANDLE (MISCELLANEOUS) ×4 IMPLANT
DERMABOND ADVANCED (GAUZE/BANDAGES/DRESSINGS) ×2
DERMABOND ADVANCED .7 DNX12 (GAUZE/BANDAGES/DRESSINGS) IMPLANT
DRAIN CHANNEL 28F RND 3/8 FF (WOUND CARE) IMPLANT
DRAIN CHANNEL 32F RND 10.7 FF (WOUND CARE) IMPLANT
DRAPE LAPAROSCOPIC ABDOMINAL (DRAPES) ×4 IMPLANT
DRAPE WARM FLUID 44X44 (DRAPE) ×4 IMPLANT
ELECT BLADE 6.5 EXT (BLADE) ×4 IMPLANT
ELECT REM PT RETURN 9FT ADLT (ELECTROSURGICAL) ×4
ELECTRODE REM PT RTRN 9FT ADLT (ELECTROSURGICAL) ×2 IMPLANT
GAUZE SPONGE 4X4 12PLY STRL (GAUZE/BANDAGES/DRESSINGS) ×4 IMPLANT
GLOVE BIO SURGEON STRL SZ 6.5 (GLOVE) ×2 IMPLANT
GLOVE BIO SURGEON STRL SZ7 (GLOVE) ×6 IMPLANT
GLOVE BIO SURGEONS STRL SZ 6.5 (GLOVE) ×2
GLOVE SURG SIGNA 7.5 PF LTX (GLOVE) ×8 IMPLANT
GOWN STRL REUS W/ TWL LRG LVL3 (GOWN DISPOSABLE) ×4 IMPLANT
GOWN STRL REUS W/ TWL XL LVL3 (GOWN DISPOSABLE) ×4 IMPLANT
GOWN STRL REUS W/TWL LRG LVL3 (GOWN DISPOSABLE) ×8
GOWN STRL REUS W/TWL XL LVL3 (GOWN DISPOSABLE) ×8
HANDLE STAPLE ENDO GIA SHORT (STAPLE)
HEMOSTAT SURGICEL 2X14 (HEMOSTASIS) IMPLANT
KIT BASIN OR (CUSTOM PROCEDURE TRAY) ×4 IMPLANT
KIT ROOM TURNOVER OR (KITS) ×4 IMPLANT
KIT SUCTION CATH 14FR (SUCTIONS) ×4 IMPLANT
NS IRRIG 1000ML POUR BTL (IV SOLUTION) ×12 IMPLANT
PACK CHEST (CUSTOM PROCEDURE TRAY) ×4 IMPLANT
PAD ARMBOARD 7.5X6 YLW CONV (MISCELLANEOUS) ×8 IMPLANT
POUCH ENDO CATCH II 15MM (MISCELLANEOUS) IMPLANT
POUCH SPECIMEN RETRIEVAL 10MM (ENDOMECHANICALS) ×2 IMPLANT
RELOAD STAPLE 35X2.5 WHT THIN (STAPLE) IMPLANT
RELOAD STAPLE 60 3.8 GOLD REG (STAPLE) IMPLANT
RELOAD STAPLE 60 4.1 GRN THCK (STAPLE) IMPLANT
RELOAD STAPLER GOLD 60MM (STAPLE) ×14 IMPLANT
RELOAD STAPLER GREEN 60MM (STAPLE) ×4 IMPLANT
SCISSORS ENDO CVD 5DCS (MISCELLANEOUS) IMPLANT
SEALANT PROGEL (MISCELLANEOUS) IMPLANT
SEALANT SURG COSEAL 4ML (VASCULAR PRODUCTS) IMPLANT
SEALANT SURG COSEAL 8ML (VASCULAR PRODUCTS) IMPLANT
SOLUTION ANTI FOG 6CC (MISCELLANEOUS) ×4 IMPLANT
SPECIMEN JAR MEDIUM (MISCELLANEOUS) ×4 IMPLANT
SPONGE GAUZE 4X4 12PLY STER LF (GAUZE/BANDAGES/DRESSINGS) ×2 IMPLANT
SPONGE INTESTINAL PEANUT (DISPOSABLE) ×10 IMPLANT
SPONGE TONSIL 1 RF SGL (DISPOSABLE) ×4 IMPLANT
STAPLE ECHEON FLEX 60 POW ENDO (STAPLE) ×4 IMPLANT
STAPLE RELOAD 2.5MM WHITE (STAPLE) ×12 IMPLANT
STAPLER ENDO GIA 12 SHRT THIN (STAPLE) IMPLANT
STAPLER ENDO GIA 12MM SHORT (STAPLE) IMPLANT
STAPLER RELOAD GOLD 60MM (STAPLE) ×28
STAPLER RELOAD GREEN 60MM (STAPLE) ×8
STAPLER VASCULAR ECHELON 35 (CUTTER) ×2 IMPLANT
SUT PROLENE 4 0 RB 1 (SUTURE)
SUT PROLENE 4-0 RB1 .5 CRCL 36 (SUTURE) IMPLANT
SUT SILK  1 MH (SUTURE) ×2
SUT SILK 1 MH (SUTURE) ×2 IMPLANT
SUT SILK 1 TIES 10X30 (SUTURE) ×4 IMPLANT
SUT SILK 2 0 SH (SUTURE) IMPLANT
SUT SILK 2 0SH CR/8 30 (SUTURE) ×2 IMPLANT
SUT SILK 3 0 SH 30 (SUTURE) IMPLANT
SUT SILK 3 0SH CR/8 30 (SUTURE) ×2 IMPLANT
SUT VIC AB 0 CTX 27 (SUTURE) IMPLANT
SUT VIC AB 1 CTX 27 (SUTURE) ×6 IMPLANT
SUT VIC AB 2-0 CT1 27 (SUTURE)
SUT VIC AB 2-0 CT1 TAPERPNT 27 (SUTURE) IMPLANT
SUT VIC AB 2-0 CTX 36 (SUTURE) ×6 IMPLANT
SUT VIC AB 3-0 MH 27 (SUTURE) IMPLANT
SUT VIC AB 3-0 SH 27 (SUTURE) ×8
SUT VIC AB 3-0 SH 27X BRD (SUTURE) IMPLANT
SUT VIC AB 3-0 X1 27 (SUTURE) ×4 IMPLANT
SUT VICRYL 0 UR6 27IN ABS (SUTURE) ×8 IMPLANT
SUT VICRYL 2 TP 1 (SUTURE) IMPLANT
SWAB COLLECTION DEVICE MRSA (MISCELLANEOUS) IMPLANT
SYRINGE 10CC LL (SYRINGE) ×4 IMPLANT
SYSTEM SAHARA CHEST DRAIN ATS (WOUND CARE) ×4 IMPLANT
TAPE CLOTH SURG 4X10 WHT LF (GAUZE/BANDAGES/DRESSINGS) ×2 IMPLANT
TIP APPLICATOR SPRAY EXTEND 16 (VASCULAR PRODUCTS) IMPLANT
TOWEL OR 17X24 6PK STRL BLUE (TOWEL DISPOSABLE) ×4 IMPLANT
TOWEL OR 17X26 10 PK STRL BLUE (TOWEL DISPOSABLE) ×8 IMPLANT
TRAP SPECIMEN MUCOUS 40CC (MISCELLANEOUS) IMPLANT
TRAY FOLEY CATH 16FRSI W/METER (SET/KITS/TRAYS/PACK) ×4 IMPLANT
TROCAR XCEL BLADELESS 5X75MML (TROCAR) ×4 IMPLANT
TROCAR XCEL NON-BLD 5MMX100MML (ENDOMECHANICALS) IMPLANT
TUBE ANAEROBIC SPECIMEN COL (MISCELLANEOUS) IMPLANT
TUNNELER SHEATH ON-Q 11GX8 DSP (PAIN MANAGEMENT) ×2 IMPLANT
WATER STERILE IRR 1000ML POUR (IV SOLUTION) ×4 IMPLANT

## 2016-04-08 NOTE — Anesthesia Procedure Notes (Signed)
Procedure Name: Intubation Date/Time: 04/08/2016 8:49 AM Performed by: Ollen Bowl Pre-anesthesia Checklist: Patient identified, Emergency Drugs available, Suction available, Patient being monitored and Timeout performed Patient Re-evaluated:Patient Re-evaluated prior to inductionOxygen Delivery Method: Circle system utilized and Simple face mask Preoxygenation: Pre-oxygenation with 100% oxygen Intubation Type: IV induction Ventilation: Mask ventilation without difficulty and Oral airway inserted - appropriate to patient size Laryngoscope Size: Sabra Heck and 2 Grade View: Grade I Endobronchial tube: Left, Double lumen EBT, EBT position confirmed by auscultation and EBT position confirmed by fiberoptic bronchoscope and 39 Fr Number of attempts: 1 Airway Equipment and Method: Patient positioned with wedge pillow and Stylet Placement Confirmation: ETT inserted through vocal cords under direct vision,  positive ETCO2 and breath sounds checked- equal and bilateral Tube secured with: Tape Dental Injury: Teeth and Oropharynx as per pre-operative assessment

## 2016-04-08 NOTE — Transfer of Care (Signed)
Immediate Anesthesia Transfer of Care Note  Patient: Miguel Morales  Procedure(s) Performed: Procedure(s): VIDEO ASSISTED THORACOSCOPY (VATS)/WEDGE RESECTION (Left) LYMPH NODE DISSECTION (N/A)  Patient Location: PACU  Anesthesia Type:General  Level of Consciousness: awake and alert   Airway & Oxygen Therapy: Patient Spontanous Breathing and Patient connected to nasal cannula oxygen  Post-op Assessment: Report given to RN, Post -op Vital signs reviewed and stable and Patient moving all extremities X 4  Post vital signs: Reviewed and stable  Last Vitals:  Filed Vitals:   04/08/16 0651 04/08/16 1227  BP: 154/84 108/67  Pulse: 89 78  Temp: 36.7 C   Resp: 20 20    Last Pain: There were no vitals filed for this visit.    Patients Stated Pain Goal: 3 (62/56/38 [id number])  Complications: No apparent anesthesia complications

## 2016-04-08 NOTE — Interval H&P Note (Signed)
History and Physical Interval Note:  04/08/2016 8:02 AM  Miguel Morales  has presented today for surgery, with the diagnosis of lul nodule  The various methods of treatment have been discussed with the patient and family. After consideration of risks, benefits and other options for treatment, the patient has consented to  Procedure(s): VIDEO ASSISTED THORACOSCOPY (VATS)/WEDGE RESECTION (Left) LYMPH NODE DISSECTION (N/A) as a surgical intervention .  The patient's history has been reviewed, patient examined, no change in status, stable for surgery.  I have reviewed the patient's chart and labs.  Questions were answered to the patient's satisfaction.     Melrose Nakayama

## 2016-04-08 NOTE — Anesthesia Preprocedure Evaluation (Addendum)
Anesthesia Evaluation  Patient identified by MRN, date of birth, ID band Patient awake    Reviewed: Allergy & Precautions, NPO status , Patient's Chart, lab work & pertinent test results  History of Anesthesia Complications Negative for: history of anesthetic complications  Airway Mallampati: II  TM Distance: >3 FB Neck ROM: Full    Dental no notable dental hx. (+) Partial Upper, Dental Advisory Given   Pulmonary shortness of breath, asthma , sleep apnea , COPD, former smoker,    breath sounds clear to auscultation- rhonchi       Cardiovascular hypertension, Pt. on medications (-) angina(-) Past MI and (-) CHF  Rhythm:Regular     Neuro/Psych negative neurological ROS  negative psych ROS   GI/Hepatic Neg liver ROS, GERD  Medicated and Controlled,  Endo/Other  negative endocrine ROS  Renal/GU Renal InsufficiencyRenal disease     Musculoskeletal  (+) Arthritis ,   Abdominal   Peds  Hematology negative hematology ROS (+)   Anesthesia Other Findings   Reproductive/Obstetrics                            Anesthesia Physical Anesthesia Plan  ASA: III  Anesthesia Plan: General   Post-op Pain Management:    Induction: Intravenous  Airway Management Planned: Double Lumen EBT  Additional Equipment: Arterial line, CVP and Ultrasound Guidance Line Placement  Intra-op Plan:   Post-operative Plan: Extubation in OR  Informed Consent: I have reviewed the patients History and Physical, chart, labs and discussed the procedure including the risks, benefits and alternatives for the proposed anesthesia with the patient or authorized representative who has indicated his/her understanding and acceptance.   Dental advisory given  Plan Discussed with: CRNA and Surgeon  Anesthesia Plan Comments:         Anesthesia Quick Evaluation

## 2016-04-08 NOTE — Brief Op Note (Addendum)
04/08/2016  12:11 PM  PATIENT:  Miguel Morales  70 y.o. 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)  POST-OPERATIVE DIAGNOSIS:  Adenocarcinoma LUL - Clinical stage IA  PROCEDURE:   LEFT VIDEO ASSISTED THORACOSCOPY (VATS),  THORACOSCOPIC LEFT UPPER LOBE LINGULAR SEGMENTECTOMY, LYMPH NODE DISSECTION,  and ON Q LOCAL ANESTHETIC CATHETER PLACEMENT  SURGEON:  Surgeon(s) and Role:    * Melrose Nakayama, MD - Primary  PHYSICIAN ASSISTANT: Lars Pinks PA-C  ANESTHESIA:   general  EBL:  Total I/O In: 1400 [I.V.:1400] Out: 625 [Urine:325; Blood:300]  BLOOD ADMINISTERED:none  DRAINS: 20 Blake drain and a 11 French chest tube placed in the left pleural space   LOCAL MEDICATIONS USED:  BUPIVICAINE   SPECIMEN:  Source of Specimen:  Left lingular segmentectomy and lymph nodes.  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   Delay start of Pharmacological VTE agent (>24hrs) due to surgical blood loss or risk of bleeding: yes

## 2016-04-08 NOTE — H&P (View-Only) (Signed)
PCP is Sallee Lange, MD Referring Provider is Curt Bears, MD  No chief complaint on file.   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  He is a 70 year old man with a history of tobacco abuse (30 pack years, quit in 2005), COPD, asthma, hypertension, mixed hyperlipidemia, reflux, sleep apnea, spinal stenosis, neurogenic claudication and arthritis. He has been followed for a groundglass nodule in the left upper lobe which was first noted in April 2015. Over 2 years it had slowly increased in size. He recently had a navigational bronchoscopy and biopsy by Dr. Lamonte Sakai which revealed adenocarcinoma with lepidic spread.  He gets short of breath with exertion. He says he can walk up 2 flights of stairs without stopping but would notice he is breathing heavier after the first flight. He really doesn't have much restriction when he is walking on level ground. He does have wheezing and a dry cough. He has sleep apnea and uses CPAP at night. He has pain in his legs with walking due to spinal stenosis which limits his activities. He has not had any change in appetite or weight loss. He had been having some atypical chest pain and had a cardiology evaluation prior to his bronchoscopy. He had a nuclear stress test which showed no evidence of ischemia and normal left ventricular function.  Miguel Morales: At the time of surgery this patient's most appropriate activity status/level should be described as: '[]'$     0    Normal activity, no symptoms '[x]'$     1    Restricted in physical strenuous activity but ambulatory, able to do out light work '[]'$     2    Ambulatory and capable of self care, unable to do work activities, up and about >50 % of waking hours                              '[]'$     3    Only limited self care, in bed greater than 50% of waking hours '[]'$     4    Completely disabled, no self care, confined to bed or  chair '[]'$     5    Moribund    Past Medical History  Diagnosis Date  . COPD (chronic obstructive pulmonary disease) ([place])   . Mixed hyperlipidemia   . GERD (gastroesophageal reflux disease)   . Cancer ([place])   . Asthma   . Spinal stenosis   . Shortness of breath dyspnea     with exertion  . Hypertension   . Sleep apnea     will sometimes wear a CPAP  . History of bronchitis   . Nocturia   . Arthritis     Past Surgical History  Procedure Laterality Date  . Knee arthroscopy Right 2004  . Septoplasty    . Circumcision    . Colonoscopy  03/04/2004    NFA:[id number] diverticula.  The remainder of the colonic mucosa appeared normal/normal rectum  . Colonoscopy  05/22/2009    ION:[id number] mucosa appeared normal/Left-sided diverticula/Normal rectum  . Colonoscopy N/A 08/02/2014    Procedure: COLONOSCOPY;  Surgeon: Daneil Dolin, MD;  Location: [facility];  Service: Endoscopy;  Laterality: N/A;  2:00 PM  . Video bronchoscopy with endobronchial navigation N/A 02/26/2016    Procedure: VIDEO BRONCHOSCOPY WITH ENDOBRONCHIAL NAVIGATION LEFT UPPER LOBE LUNG NODULE;  Surgeon: [name], MD;  Location: [facility] OR;  Service: Thoracic;  Laterality: N/A;    Family History  Problem Relation Age of Onset  . Cancer Mother     Social History Social History  Substance Use Topics  . Smoking status: Former Smoker -- 1.00 packs/day for 30 years    Types: Cigarettes    Start date: 11/09/1958    Quit date: 11/10/2003  . Smokeless tobacco: Never Used  . Alcohol Use: 0.0 oz/week    0 Standard drinks or equivalent per week     Comment: Occasionally    Current Outpatient Prescriptions  Medication Sig Dispense Refill  . acetaminophen (TYLENOL) 500 MG tablet Take 500 mg by mouth every 6 (six) hours as needed.    Marland Kitchen albuterol (PROVENTIL HFA;VENTOLIN HFA) 108 (90 Base) MCG/ACT inhaler Inhale 2 puffs into the lungs every 6 (six) hours as needed for wheezing. 3 Inhaler 2  . amLODipine (NORVASC) 10  MG tablet Take 1 tablet (10 mg total) by mouth daily. 90 tablet 1  . Ascorbic Acid (VITAMIN C) 1000 MG tablet Take 1,000 mg by mouth daily.    Marland Kitchen aspirin 81 MG tablet Take 81 mg by mouth daily.    Marland Kitchen atorvastatin (LIPITOR) 40 MG tablet Take 1 tablet (40 mg total) by mouth daily. 90 tablet 1  . budesonide-formoterol (SYMBICORT) 160-4.5 MCG/ACT inhaler Inhale 2 puffs into the lungs 2 (two) times daily.    . Coenzyme Q10 (COQ10) 400 MG CAPS Take 1 capsule by mouth daily.     . Flaxseed, Linseed, (FLAXSEED OIL) 1000 MG CAPS Take 1 capsule by mouth daily.    . fluticasone (FLONASE) 50 MCG/ACT nasal spray USE 1 SPRAY IN EACH NOSTRIL DAILY AS NEEDED 16 g 5  . ibuprofen (ADVIL,MOTRIN) 200 MG tablet Take 800 mg by mouth 2 (two) times daily as needed.     Marland Kitchen ketotifen (ZADITOR) 0.025 % ophthalmic solution Place 1 drop into both eyes 2 (two) times daily as needed.    . lidocaine (XYLOCAINE) 5 % ointment Apply 1 application topically as needed for mild pain (For Back and hip).     . Loratadine 10 MG CAPS Take by mouth daily. PRN    . mometasone (ELOCON) 0.1 % lotion APPLY TOPICALLY DAILY 60 mL 3  . omeprazole (PRILOSEC) 20 MG capsule TAKE 1 CAPSULE DAILY AS NEEDED 90 capsule 1  . SPIRIVA HANDIHALER 18 MCG inhalation capsule INHALE THE CONTENTS OF 1 CAPSULE DAILY 90 capsule 1  . tamsulosin (FLOMAX) 0.4 MG CAPS capsule Take 1 tablet by mouth at bedtime. 90 capsule 1  . triamcinolone (KENALOG) 0.025 % cream Apply 1 application topically 2 (two) times daily.    . Turmeric Curcumin 500 MG CAPS Take 1 capsule by mouth daily.     No current facility-administered medications for this visit.    Allergies  Allergen Reactions  . Sulfa Antibiotics     Review of Systems  Constitutional: Negative for fever, chills, appetite change and unexpected weight change.       Decreased energy  HENT: Positive for hearing loss. Negative for trouble swallowing and voice change.   Eyes: Negative for photophobia and visual  disturbance.  Respiratory: Positive for cough, shortness of breath (With exertion) and wheezing.   Cardiovascular: Negative for chest pain, palpitations and leg swelling.  Gastrointestinal: Negative for abdominal pain and blood in stool.  Genitourinary: Negative for dysuria and hematuria.  Musculoskeletal: Positive for back pain, joint swelling, arthralgias and gait problem.  Skin:  Itching  Neurological: Negative for syncope, weakness and headaches.  Hematological: Negative for adenopathy. Bruises/bleeds easily.  All other systems reviewed and are negative.   BP 113/76 mmHg  Pulse 97  Temp(Src) 98.4 F (36.9 C)  Resp 18  Wt 212 lb 9.6 oz (96.435 kg)  SpO2 98% Physical Exam  Constitutional: He is oriented to person, place, and time.  obese  HENT:  Head: Normocephalic and atraumatic.  Mouth/Throat: No oropharyngeal exudate.  Eyes: Conjunctivae and EOM are normal. No scleral icterus.  Neck: Neck supple. No thyromegaly present.  Cardiovascular: Normal rate, regular rhythm, normal heart sounds and intact distal pulses.  Exam reveals no gallop and no friction rub.   No murmur heard. Pulmonary/Chest: Effort normal. No respiratory distress. He has no wheezes. He has no rales.  Diminished breath sounds bilaterally secondary to body habitus  Abdominal: Soft. He exhibits no distension. There is no tenderness.  Musculoskeletal: He exhibits no edema.  Lymphadenopathy:    He has no cervical adenopathy.  Neurological: He is alert and oriented to person, place, and time. No cranial nerve deficit.  Motor grossly intact  Skin: Skin is warm and dry.     Diagnostic Tests: CT CHEST WITHOUT CONTRAST  TECHNIQUE: Multidetector CT imaging of the chest was performed following the standard protocol without IV contrast.  COMPARISON: Multiple prior chest CTs. The most recent is 07/24/2015  FINDINGS: Mediastinum/Nodes: No chest wall mass, supraclavicular or  axillary lymphadenopathy.  The heart is normal in size. No pericardial effusion. Dense 3 vessel coronary artery calcifications noted. There is mild tortuosity, ectasia and calcification the thoracic aorta which is stable.  Small scattered mediastinal and hilar lymph nodes but no mass or adenopathy. The esophagus is normal.  Lungs/Pleura: Stable emphysematous changes. No acute pulmonary findings. There is a stable left upper lobe ground-glass opacity on image number 28 which measures 14.5 x 11.5 mm. This is unchanged since the most recent chest CT. It is slightly larger that was in March 2015. Recommend continued surveillance. No new pulmonary lesions. No pleural effusion. No bronchiectasis or interstitial lung disease.  Upper abdomen: No significant findings.  Musculoskeletal: No significant findings. Stable advanced degenerative changes involving the thoracic spine.  IMPRESSION: 1. Stable left upper lobe ground-glass nodule measuring 14.5 x 11.5 mm. This has increased slightly in size since the regional chest CT in March 2015. Recommend repeat noncontrast chest CT in 2 years. (CT is recommended every 2 years until 5 years of stability has been established. This recommendation follows the consensus statement: Guidelines for Management of Incidental Pulmonary Nodules Detected on CT Images:From the Fleischner Society 2017; published online before print (10.1148/radiol.9242683419)). 2. Stable emphysematous changes. No acute pulmonary findings. No new pulmonary lesions. 3. Stable atherosclerotic calcifications involving the aorta and branch vessels including the Coronary arteries.   Electronically Signed  By: Marijo Sanes M.D.  On: 01/29/2016 13:38  NUCLEAR MEDICINE PET SKULL BASE TO THIGH  TECHNIQUE: 10.4 mCi F-18 FDG was injected intravenously. Full-ring PET imaging was performed from the skull base to thigh after the radiotracer. CT data was obtained and  used for attenuation correction and anatomic localization.  FASTING BLOOD GLUCOSE: Value: 85 mg/dl  COMPARISON: CT 01/29/2016, 10/02/2014, PET-CT 06/08/2014.  FINDINGS: NECK  No hypermetabolic lymph nodes in the neck.  CHEST  Ground-glass nodule in the LEFT upper lobe measuring 11 mm x 16 mm compares to 10 mm x 15 mm  PET-CT 06/08/2014 for no significant change. Lesion continues demonstrate very low metabolic activity with SUV  max equal 1.8 increased from 1.2. No additional pulmonary nodules. No hypermetabolic mediastinal lymph nodes.  ABDOMEN/PELVIS  No abnormal hypermetabolic activity within the liver, pancreas, adrenal glands, or spleen. No hypermetabolic lymph nodes in the abdomen or pelvis.  SKELETON  No focal hypermetabolic activity to suggest skeletal metastasis.  IMPRESSION: Low metabolic activity associated with LEFT upper lobe pulmonary nodule with minimal increase from change from PET-CT of 06/08/2014. Adenocarcinoma identified on endobronchial sampling.  No evidence of metastatic disease.   Electronically Signed  By: Suzy Bouchard M.D.  On: 03/06/2016 15:40 I personally reviewed the CT and PET/CT concur with the findings as noted above  Pulmonary function testing 01/02/2015 FVC 2.98 (78%) FEV1 1.96 (71%) DLCO 18.33 (68%)  Impression:  70 year old man with a history of tobacco abuse and COPD who has a small slowly growing adenocarcinoma the left upper lobe. This is a stage IA lesion clinically. Options for treatment include surgery and stereotactic radiation. He will meet with Dr. Tammi Klippel later this afternoon to discuss radiation.  I discussed the surgical option with Mr. Cornelio and his family. I think that gives him the best chance of cure. I do think his chance for cure is reasonable with radiation, but not quite as high as with resection. Given that this appears to be a pure ground glass opacity, I think we can treat him with a  wide wedge resection and node dissection. At the most, segmentectomy could be considered.  I discussed the proposed operation with Mr. Trefz has family in detail. I reviewed the indications, risks, benefits, and alternatives. We discussed the general nature of the procedure, the need for general anesthesia, the incisions to be used, use of drainage tubes postoperatively, expected hospital stay, and overall recovery. They understand the risks of surgery include, but are not limited to death, MI, DVT, PE, bleeding, possibly transfusion, stroke, infection, prolonged air leak, cardiac arrhythmias, as well as the possibility of other unforeseeable complications.  He wishes to talk with Dr. Tammi Klippel and think over his options before deciding how to proceed.  It has been a year since his pulmonary function testing, but since we are doing a limited resection and he quit smoking several years ago I do not think that needs to be repeated.  Plan: Patient will inform us how he would like to proceed.  If he opts for surgery we will plan to do a left VATS, wedge resection, and lymph node dissection.  Melrose Nakayama, MD [facility] [phone]

## 2016-04-09 ENCOUNTER — Inpatient Hospital Stay (HOSPITAL_COMMUNITY): Payer: Medicare Other

## 2016-04-09 ENCOUNTER — Encounter (HOSPITAL_COMMUNITY): Payer: Self-pay | Admitting: Thoracic Surgery (Cardiothoracic Vascular Surgery)

## 2016-04-09 LAB — GLUCOSE, CAPILLARY
GLUCOSE-CAPILLARY: 113 mg/dL — AB (ref 65–99)
GLUCOSE-CAPILLARY: 168 mg/dL — AB (ref 65–99)
Glucose-Capillary: 150 mg/dL — ABNORMAL HIGH (ref 65–99)
Glucose-Capillary: 174 mg/dL — ABNORMAL HIGH (ref 65–99)
Glucose-Capillary: 231 mg/dL — ABNORMAL HIGH (ref 65–99)

## 2016-04-09 LAB — BLOOD GAS, ARTERIAL
Acid-base deficit: 0.9 mmol/L (ref 0.0–2.0)
Bicarbonate: 23 mEq/L (ref 20.0–24.0)
FIO2: 0.21
O2 SAT: 92.5 %
PATIENT TEMPERATURE: 97.5
TCO2: 24.1 mmol/L (ref 0–100)
pCO2 arterial: 35.2 mmHg (ref 35.0–45.0)
pH, Arterial: 7.428 (ref 7.350–7.450)
pO2, Arterial: 62.5 mmHg — ABNORMAL LOW (ref 80.0–100.0)

## 2016-04-09 LAB — BASIC METABOLIC PANEL
ANION GAP: 8 (ref 5–15)
BUN: 12 mg/dL (ref 6–20)
CALCIUM: 8.4 mg/dL — AB (ref 8.9–10.3)
CO2: 23 mmol/L (ref 22–32)
Chloride: 105 mmol/L (ref 101–111)
Creatinine, Ser: 1.02 mg/dL (ref 0.61–1.24)
GLUCOSE: 122 mg/dL — AB (ref 65–99)
Potassium: 3.8 mmol/L (ref 3.5–5.1)
SODIUM: 136 mmol/L (ref 135–145)

## 2016-04-09 LAB — CBC
HCT: 37.2 % — ABNORMAL LOW (ref 39.0–52.0)
HEMOGLOBIN: 12.1 g/dL — AB (ref 13.0–17.0)
MCH: 30.3 pg (ref 26.0–34.0)
MCHC: 32.5 g/dL (ref 30.0–36.0)
MCV: 93 fL (ref 78.0–100.0)
PLATELETS: 229 10*3/uL (ref 150–400)
RBC: 4 MIL/uL — AB (ref 4.22–5.81)
RDW: 13.4 % (ref 11.5–15.5)
WBC: 9.3 10*3/uL (ref 4.0–10.5)

## 2016-04-09 MED ORDER — ENOXAPARIN SODIUM 40 MG/0.4ML ~~LOC~~ SOLN
40.0000 mg | SUBCUTANEOUS | Status: DC
  Administered 2016-04-09 – 2016-04-12 (×4): 40 mg via SUBCUTANEOUS
  Filled 2016-04-09 (×5): qty 0.4

## 2016-04-09 MED ORDER — FLUTICASONE PROPIONATE 50 MCG/ACT NA SUSP
1.0000 | Freq: Every day | NASAL | Status: DC | PRN
  Filled 2016-04-09: qty 16

## 2016-04-09 MED ORDER — INSULIN ASPART 100 UNIT/ML ~~LOC~~ SOLN
0.0000 [IU] | Freq: Three times a day (TID) | SUBCUTANEOUS | Status: DC
  Administered 2016-04-09: 2 [IU] via SUBCUTANEOUS
  Administered 2016-04-09: 3 [IU] via SUBCUTANEOUS

## 2016-04-09 MED ORDER — LORATADINE 10 MG PO TABS: 10.0000 mg | ORAL_TABLET | Freq: Every day | ORAL | Status: DC | PRN

## 2016-04-09 MED ORDER — KETOTIFEN FUMARATE 0.025 % OP SOLN
1.0000 [drp] | Freq: Two times a day (BID) | OPHTHALMIC | Status: DC | PRN
  Filled 2016-04-09: qty 5

## 2016-04-09 NOTE — Progress Notes (Signed)
1 Day Post-Op Procedure(s) (LRB): VIDEO ASSISTED THORACOSCOPY (VATS)/WEDGE RESECTION (Left) LYMPH NODE DISSECTION (N/A) Subjective: Some nausea last night, better this AM Pain well controlled  Objective: Vital signs in last 24 hours: Temp:  [97.5 F (36.4 C)-98.6 F (37 C)] 97.5 F (36.4 C) (06/01 0450) Pulse Rate:  [73-96] 89 (06/01 0450) Cardiac Rhythm:  [-] Normal sinus rhythm (06/01 0450) Resp:  [9-23] 13 (06/01 0753) BP: (108-144)/(67-95) 144/95 mmHg (06/01 0450) SpO2:  [94 %-100 %] 96 % (06/01 0753) Arterial Line BP: (86-140)/(46-75) 140/75 mmHg (06/01 0450) Weight:  [223 lb 15.8 oz (101.6 kg)] 223 lb 15.8 oz (101.6 kg) (05/31 1431)  Hemodynamic parameters for last 24 hours:    Intake/Output from previous day: 05/31 0701 - 06/01 0700 In: 3568 [P.O.:600; I.V.:2845; IV Piggyback:50] Out: 2655 [Urine:2085; Blood:300; Chest Tube:270] Intake/Output this shift:    General appearance: alert, cooperative and no distress Neurologic: intact Heart: regular rate and rhythm Lungs: diminished breath sounds bibasilar Abdomen: mildly distended, nontender  Lab Results:  Recent Labs  04/09/16 0415  WBC 9.3  HGB 12.1*  HCT 37.2*  PLT 229   BMET:  Recent Labs  04/09/16 0415  NA 136  K 3.8  CL 105  CO2 23  GLUCOSE 122*  BUN 12  CREATININE 1.02  CALCIUM 8.4*    PT/INR: No results for input(s): LABPROT, INR in the last 72 hours. ABG    Component Value Date/Time   PHART 7.428 04/09/2016 0500   HCO3 23.0 04/09/2016 0500   TCO2 24.1 04/09/2016 0500   ACIDBASEDEF 0.9 04/09/2016 0500   O2SAT 92.5 04/09/2016 0500   CBG (last 3)   Recent Labs  04/08/16 1631 04/08/16 2335 04/09/16 0510  GLUCAP 144* 174* 113*    Assessment/Plan: S/P Procedure(s) (LRB): VIDEO ASSISTED THORACOSCOPY (VATS)/WEDGE RESECTION (Left) LYMPH NODE DISSECTION (N/A) -  POD # 1 lingular segmentectomy No air leak- CT to water seal DC arterial line Mobilize Advance diet as  tolerated IS, inhalers SCD + enoxaparin for DVT prophylaxis   LOS: 1 day    Melrose Nakayama 04/09/2016

## 2016-04-09 NOTE — Discharge Summary (Signed)
Physician Discharge Summary       [address]       [place] [address]             [phone]    Patient ID: Miguel Morales MRN: [id number] DOB/AGE: 11-Jan-1946 70 y.o.  Admit date: 04/08/2016 Discharge date: 04/13/2016  Admission Diagnosis: Adenocarcinoma left upper lobe- Clinical stage IA  Discharge Diagnosis: Adenocarcinoma left upper lobe- Pathologic stage IA  Active Diagnoses:  1. COPD (chronic obstructive pulmonary disease) ([place]) 2. Mixed hyperlipidemia 3. GERD (gastroesophageal reflux disease) 4. Hypertension 5. Sleep apnea-sometimes wears CPAP 6. Spinal stenosis 7. Tobacco abuse 8. Arthirits  Procedure (s):  Left video-assisted thoracoscopy, thoracoscopic left upper lobe lingular segmentectomy, mediastinal lymph node dissection, and an On-Q local anesthetic catheter placement by Dr. Roxan Hockey on 04/08/2016.   Pathology:  Diagnosis 1. Lung, resection (segmental or lobe), Lingular Left Upper Lobe - ADENOCARCINOMA, 1.1 CM. - MARGINS NOT INVOLVED. 2. Lymph node, biopsy, Level 10 - ANTHRACOTIC LYMPH NODE. - NO TUMOR IDENTIFIED. 3. Lymph node, biopsy, Level 12 - ANTHRACOTIC LYMPH NODE. - NO TUMOR IDENTIFIED. 4. Lymph node, biopsy, Level 11 - ANTHRACOTIC LYMPH NODE. - NO TUMOR IDENTIFIED. 5. Lymph node, biopsy, Level 12 #2 - ANTHRACOTIC LYMPH NODE. - NO TUMOR IDENTIFIED. 6. Lymph node, biopsy, Level 13 - ANTHRACOTIC LYMPH NODE. - NO TUMOR IDENTIFIED. 7. Lymph node, biopsy, Level 5 - ANTHRACOTIC LYMPH NODE. - NO TUMOR IDENTIFIED. 8. Lymph node, biopsy, Level 5 #2 - ANTHRACOTIC LYMPH NODE. - NO TUMOR IDENTIFIED. 9. Lymph node, biopsy, Level 13 #2 - ANTHRACOTIC LYMPH NODE. - NO TUMOR IDENTIFIED. TNM code: pT1a, pN0  History of Presenting Illness: He is a 70 year old man with a history of tobacco abuse (30 pack years, quit in 2005), COPD, asthma, hypertension, mixed hyperlipidemia, reflux, sleep apnea, spinal stenosis, neurogenic  claudication and arthritis. He has been followed for a groundglass nodule in the left upper lobe which was first noted in April 2015. Over 2 years it had slowly increased in size. He recently had a navigational bronchoscopy and biopsy by Dr. Lamonte Sakai which revealed adenocarcinoma with lepidic spread.  He gets short of breath with exertion. He says he can walk up 2 flights of stairs without stopping but would notice he is breathing heavier after the first flight. He really doesn't have much restriction when he is walking on level ground. He does have wheezing and a dry cough. He has sleep apnea and uses CPAP at night. He has pain in his legs with walking due to spinal stenosis which limits his activities. He has not had any change in appetite or weight loss. He had been having some atypical chest pain and had a cardiology evaluation prior to his bronchoscopy. He had a nuclear stress test which showed no evidence of ischemia and normal left ventricular function.  Dr. Roxan Hockey discussed the treatment options of surgery vs stereotactic radiation. Patient met with Dr. Tammi Klippel to discuss radiation. Dr. Roxan Hockey discussed potential risks, benefits, and complications of a left VATS, wedge resection, and lymph node dissection. Patient ultimately decided on surgical intervention. He underwent the aforementioned procedure on 04/08/2016.  Brief Hospital Course:  He remained afebrile and hemodynamically stable. A line and foley were removed early in his post operative course. Chest tube output gradually decreased and chest x rays remained stable. There was no air leak. All chest tubes were removed by 06/04. He is ambulating on room air. He is tolerating a diet and has had a bowel movement.  His left chest wounds are continuing to heal. Central line will be removed today 06/05. He is felt surgically stable for discharge today.   Latest Vital Signs: Blood pressure 126/97, pulse 100, temperature 98.6 F (37 C),  temperature source Oral, resp. rate 21, height '5\' 6"'$  (1.676 m), weight 223 lb 15.8 oz (101.6 kg), SpO2 93 %.  Physical Exam: Cardiovascular: RRR. Pulmonary: Clear to auscultation on the right and slightly diminished on the left base Abdomen: Soft, non tender, bowel sounds present. Wounds: Clean and dry. Skin tear on abdomen from tape  Discharge Condition:Stable and discharged to home.  Recent laboratory studies:  Lab Results  Component Value Date   WBC 9.6 04/10/2016   HGB 11.4* 04/10/2016   HCT 35.3* 04/10/2016   MCV 93.6 04/10/2016   PLT 209 04/10/2016   Lab Results  Component Value Date   NA 136 04/10/2016   K 3.7 04/10/2016   CL 105 04/10/2016   CO2 26 04/10/2016   CREATININE 1.03 04/10/2016   GLUCOSE 136* 04/10/2016    Diagnostic Studies:  EXAM: CHEST 2 VIEW  COMPARISON: Yesterday  FINDINGS: Left chest tube removed. No pneumothorax. Heterogeneous opacities in the left mid and lower lung zone have increased. Emphysema over the left lower chest wall has increased. Right jugular venous catheter stable. Right lung clear.  IMPRESSION: Left chest tube removed without ensuing pneumothorax.  Increased left lung airspace disease.   Electronically Signed  By: Marybelle Killings M.D.  On: 04/13/2016 07:46  Discharge Medications:   Medication List    TAKE these medications        acetaminophen 500 MG tablet  Commonly known as:  TYLENOL  Take 500 mg by mouth every 6 (six) hours as needed.     albuterol 108 (90 Base) MCG/ACT inhaler  Commonly known as:  PROVENTIL HFA;VENTOLIN HFA  Inhale 2 puffs into the lungs every 6 (six) hours as needed for wheezing.     amLODipine 10 MG tablet  Commonly known as:  NORVASC  Take 1 tablet (10 mg total) by mouth daily.     aspirin 81 MG tablet  Take 81 mg by mouth daily.     atorvastatin 40 MG tablet  Commonly known as:  LIPITOR  Take 1 tablet (40 mg total) by mouth daily.     budesonide-formoterol 160-4.5 MCG/ACT  inhaler  Commonly known as:  SYMBICORT  Inhale 2 puffs into the lungs 2 (two) times daily.     CoQ10 400 MG Caps  Take 1 capsule by mouth daily.     Flaxseed Oil 1000 MG Caps  Take 1 capsule by mouth daily.     fluticasone 50 MCG/ACT nasal spray  Commonly known as:  FLONASE  USE 1 SPRAY IN EACH NOSTRIL DAILY AS NEEDED     ibuprofen 200 MG tablet  Commonly known as:  ADVIL,MOTRIN  Take 800 mg by mouth 2 (two) times daily as needed.     ketotifen 0.025 % ophthalmic solution  Commonly known as:  ZADITOR  Place 1 drop into both eyes 2 (two) times daily as needed.     lidocaine 5 % ointment  Commonly known as:  XYLOCAINE  Apply 1 application topically as needed for mild pain (For Back and hip).     loratadine 10 MG tablet  Commonly known as:  CLARITIN  Take 10 mg by mouth daily as needed for allergies.     Magnesium 250 MG Tabs  Take 1 tablet by mouth daily.  mometasone 0.1 % lotion  Commonly known as:  ELOCON  APPLY TOPICALLY DAILY     omeprazole 20 MG capsule  Commonly known as:  PRILOSEC  TAKE 1 CAPSULE DAILY AS NEEDED     SPIRIVA HANDIHALER 18 MCG inhalation capsule  Generic drug:  tiotropium  INHALE THE CONTENTS OF 1 CAPSULE DAILY     tamsulosin 0.4 MG Caps capsule  Commonly known as:  FLOMAX  Take 1 tablet by mouth at bedtime.     traMADol 50 MG tablet  Commonly known as:  ULTRAM  Take 1 tablet (50 mg total) by mouth every 6 (six) hours as needed (mild pain).     triamcinolone 0.025 % cream  Commonly known as:  KENALOG  Apply 1 application topically 2 (two) times daily.     Turmeric Curcumin 500 MG Caps  Take 1 capsule by mouth daily.     vitamin C 1000 MG tablet  Take 1,000 mg by mouth daily.        Follow Up Appointments: Follow-up Information    Follow up with Melrose Nakayama, MD On 04/28/2016.   Specialty:  Cardiothoracic Surgery   Why:  PA/LAT CXR to be taken (at [facility] which is in the same building as Dr. Leonarda Salon  office) on 04/28/2016 at 12:00 pm;Appointment time is at 12:45 pm   Contact information:   [place] [address] [phone]       Follow up with Sallee Lange, MD.   Specialty:  Family Medicine   Why:  Call for a follow up appointment regarding further surveillance of HGA1C 6.1 (pre diabetes)   Contact information:   [address] [place] [address] [phone]       Signed: ZIMMERMAN,DONIELLE MPA-C 04/13/2016, 8:09 AM

## 2016-04-09 NOTE — Anesthesia Postprocedure Evaluation (Signed)
Anesthesia Post Note  Patient: Miguel Morales  Procedure(s) Performed: Procedure(s) (LRB): VIDEO ASSISTED THORACOSCOPY (VATS)/WEDGE RESECTION (Left) LYMPH NODE DISSECTION (N/A)  Patient location during evaluation: PACU Anesthesia Type: General Level of consciousness: awake Pain management: pain level controlled Vital Signs Assessment: post-procedure vital signs reviewed and stable Respiratory status: spontaneous breathing Cardiovascular status: stable Postop Assessment: no signs of nausea or vomiting Anesthetic complications: no    Last Vitals:  Filed Vitals:   04/09/16 1921 04/09/16 2009  BP:  125/76  Pulse:  93  Temp:  37.2 C  Resp: 16 19    Last Pain:  Filed Vitals:   04/09/16 2010  PainSc: 0-No pain                 [name]

## 2016-04-09 NOTE — Op Note (Signed)
NAME:  Miguel Morales, Miguel Morales NO.:  [id number]  MEDICAL RECORD NO.:  [id number]  LOCATION:  3S11C                        FACILITY:  [facility]  PHYSICIAN:  Revonda Standard. Roxan Hockey, M.D.DATE OF BIRTH:  Aug 10, 1946  DATE OF PROCEDURE:  04/08/2016 DATE OF DISCHARGE:                              OPERATIVE REPORT   PREOPERATIVE DIAGNOSIS:  Adenocarcinoma, left upper lobe.  POSTOPERATIVE DIAGNOSIS:  Adenocarcinoma, left upper lobe.  PROCEDURE:   Left video-assisted thoracoscopy, Thoracoscopic left upper lobe lingular segmentectomy, Mediastinal lymph node dissection, and On-Q local anesthetic catheter placement.  SURGEON:  Revonda Standard. Roxan Hockey, M.D.  ASSISTANT:  Lars Pinks, PA.  ANESTHESIA:  General.  FINDINGS:  1 cm mass in lingular segment left upper lobe, subpleural. Unable to get a good grip on the lesion to allow wedge resection. Lingular segmentectomy performed to ensure adequate margin.  Closest margin negative on frozen section.  CLINICAL NOTE:  Miguel Morales is a 70 year old gentleman with a history of tobacco abuse who has been followed for a ground-glass nodule in the left upper lobe.  This slowly increased in size.  Dr. Lamonte Sakai performed navigational bronchoscopy which revealed adenocarcinoma with lepidic spread.  He was offered the choice of limited resection versus stereotactic radiation. After considering the risks and benefits of each approach, he opted for surgical resection.  The indications, risks, benefits, and alternatives were discussed in detail with the patient. He understood and accepted the risks and agreed to proceed.  OPERATIVE NOTE:  Miguel Morales was brought to the preoperative holding area on Apr 08, 2016.  Anesthesia placed a central line and an arterial blood pressure monitoring line.  He was taken to the operating room, anesthetized, and intubated with a double-lumen endotracheal tube.  A Foley catheter was placed.  Intravenous  antibiotics were administered. Sequential compression devices were placed on the calves for DVT prophylaxis.  He was placed in a right lateral decubitus position, and the left chest was prepped and draped in usual sterile fashion.  Single lung ventilation of the left lung was initiated and was tolerated well throughout the procedure.  An incision made in the seventh intercostal space in the midaxillary line.  A 5 mm port was placed into the chest and a thoracoscope was advanced into the chest.  There was good isolation of the left lung.  A 5 cm working incision was made in the fourth interspace anterolaterally. No rib spreading was performed during the procedure.  There were some adhesions in the fissure which were taken down with electrocautery.  The left upper lobe was palpated and the nodule was identified, this was in the lingular segment laterally.  Attempts to grab the nodule were difficult due to a subpleural location.  It was unclear whether the instrument or the nodule was being felt.  I was concerned that attempting a wedge resection would leave either a portion of nonfunctional lung or result in an inadequate margin.  The lesion was suitable for lingular segmentectomy, and the decision was made to proceed with that.  The major fissure was completed between the lingula and the lower lobe with firing of an endoscopic stapler. An Echelon 60 mm stapler with gold cartridges was  used.  A lingular arterial branch was identified, and the surrounding lymph nodes were removed. All nodes that were encountered during the dissection were removed and sent as separate specimens for permanent pathology.  They all appeared grossly benign.  The lingular arterial branch then was encircled and divided with the endoscopic vascular stapler.  The lingular vein branches were identified at the hilum, dissected out, encircled and divided with the vascular stapler as well. The lingular segmental  bronchus then was clearly visible.  It was encircled and divided with the 60 mm stapler using a green cartridge, after doing a test inflation to ensure good aeration of the remainder of the upper lobe.  The nodule that was palpated was marked with a suture. A lingular segmentectomy was completed with sequential firings of the 60 mm stapler using both gold and green cartridges.  The specimen was removed and was sent for frozen section of the mass as well as the closest margin.  Of note, there were no other palpable masses within the lung.  There was good hemostasis of all staple lines.  While awaiting the results of the frozen section, the aortopulmonary window was explored. The overlying pleura was incised.  A large node was identified and removed.  A level 10 node also was removed and sent for permanent pathology as well.  An On-Q local anesthetic catheter was tunneled into a subpleural location from a separate stab incision posteriorly.  The frozen section returned showing atypical cells consistent with low grade adenocarcinoma.  The closest staple margin was negative for tumor.  The chest was copiously irrigated with warm saline. A test inflation showed no air leak.  There was good inflation of the upper and lower lobes.  A 28-French chest tube was placed through the original port incision.  This was a Blake drain, which was placed posteriorly.  Another incision was made anteriorly and a 28-French chest tube was placed through this incision as well.  Both were secured to skin with #1 silk sutures.  The remainder of the upper lobe and lower lobes then were reinflated.  The working incision was closed in 2 layers with a running #1 Vicryl fascial suture and 2-0 Vicryl subcutaneous suture.  The skin then was closed with a 3-0 Vicryl subcuticular suture. The patient was placed back in a supine position.  He was extubated in the operating room and taken to the postanesthetic care unit in  good condition.     Revonda Standard Roxan Hockey, M.D.     SCH/MEDQ  D:  04/08/2016  T:  04/09/2016  Job:  [id number]

## 2016-04-09 NOTE — Discharge Instructions (Signed)
Thoracoscopy, Care After Refer to this sheet in the next few weeks. These instructions provide you with information about caring for yourself after your procedure. Your health care provider may also give you more specific instructions. Your treatment has been planned according to current medical practices, but problems sometimes occur. Call your health care provider if you have any problems or questions after your procedure. WHAT TO EXPECT AFTER THE PROCEDURE: After your procedure, it is common to feel sore for up to two weeks. HOME CARE INSTRUCTIONS  There are many different ways to close and cover an incision, including stitches (sutures), skin glue, and adhesive strips. Follow your health care provider's instructions about:  Incision care.  Bandage (dressing) changes and removal.  Incision closure removal.  Check your incision area every day for signs of infection. Watch for:  Redness, swelling, or pain.  Fluid, blood, or pus.  Take medicines only as directed by your health care provider.  Try to cough often. Coughing helps to protect against lung infection (pneumonia). It may hurt to cough. If this happens, hold a pillow against your chest when you cough.  Take deep breaths. This also helps to protect against pneumonia.  If you were given an incentive spirometer, use it as directed by your health care provider.  Do not take baths, swim, or use a hot tub until your health care provider approves. You may take showers.  Avoid lifting until your health care provider approves.  Avoid driving until your health care provider approves.  Do not travel by airplane after the chest tube is removed until your health care provider approves. SEEK MEDICAL CARE IF:  You have a fever.  Pain medicines do not ease your pain.  You have redness, swelling, or increasing pain in your incision area.  You develop a cough that does not go away, or you are coughing up mucus that is yellow or  green. SEEK IMMEDIATE MEDICAL CARE IF:  You have fluid, blood, or pus coming from your incision.  There is a bad smell coming from your incision or dressing.  You develop a rash.  You have difficulty breathing.  You cough up blood.  You develop light-headedness or you feel faint.  You develop chest pain.  Your heartbeat feels irregular or very fast.   This information is not intended to replace advice given to you by your health care provider. Make sure you discuss any questions you have with your health care provider.   Document Released: 05/15/2005 Document Revised: 11/16/2014 Document Reviewed: 07/11/2014 Elsevier Interactive Patient Education [employer].

## 2016-04-09 NOTE — Progress Notes (Signed)
Patient's skin had open blisters where the medipore tape had touched skin. Dressing removed, guaze replaced and where the blisters were, we applied Telfa/non-adherent pad then used paper tape.

## 2016-04-10 ENCOUNTER — Inpatient Hospital Stay (HOSPITAL_COMMUNITY): Payer: Medicare Other

## 2016-04-10 LAB — COMPREHENSIVE METABOLIC PANEL
ALT: 16 U/L — ABNORMAL LOW (ref 17–63)
ANION GAP: 5 (ref 5–15)
AST: 26 U/L (ref 15–41)
Albumin: 3 g/dL — ABNORMAL LOW (ref 3.5–5.0)
Alkaline Phosphatase: 56 U/L (ref 38–126)
BILIRUBIN TOTAL: 0.7 mg/dL (ref 0.3–1.2)
BUN: 11 mg/dL (ref 6–20)
CHLORIDE: 105 mmol/L (ref 101–111)
CO2: 26 mmol/L (ref 22–32)
Calcium: 8.2 mg/dL — ABNORMAL LOW (ref 8.9–10.3)
Creatinine, Ser: 1.03 mg/dL (ref 0.61–1.24)
Glucose, Bld: 136 mg/dL — ABNORMAL HIGH (ref 65–99)
POTASSIUM: 3.7 mmol/L (ref 3.5–5.1)
Sodium: 136 mmol/L (ref 135–145)
TOTAL PROTEIN: 5.5 g/dL — AB (ref 6.5–8.1)

## 2016-04-10 LAB — CBC
HEMATOCRIT: 35.3 % — AB (ref 39.0–52.0)
Hemoglobin: 11.4 g/dL — ABNORMAL LOW (ref 13.0–17.0)
MCH: 30.2 pg (ref 26.0–34.0)
MCHC: 32.3 g/dL (ref 30.0–36.0)
MCV: 93.6 fL (ref 78.0–100.0)
PLATELETS: 209 10*3/uL (ref 150–400)
RBC: 3.77 MIL/uL — ABNORMAL LOW (ref 4.22–5.81)
RDW: 13.5 % (ref 11.5–15.5)
WBC: 9.6 10*3/uL (ref 4.0–10.5)

## 2016-04-10 LAB — GLUCOSE, CAPILLARY: Glucose-Capillary: 137 mg/dL — ABNORMAL HIGH (ref 65–99)

## 2016-04-10 LAB — ACID FAST CULTURE WITH REFLEXED SENSITIVITIES

## 2016-04-10 LAB — ACID FAST CULTURE WITH REFLEXED SENSITIVITIES (MYCOBACTERIA): Acid Fast Culture: NEGATIVE

## 2016-04-10 MED ORDER — POTASSIUM CHLORIDE CRYS ER 20 MEQ PO TBCR
30.0000 meq | EXTENDED_RELEASE_TABLET | Freq: Once | ORAL | Status: AC
  Administered 2016-04-10: 30 meq via ORAL
  Filled 2016-04-10: qty 1

## 2016-04-10 NOTE — Care Management Important Message (Signed)
Important Message  Patient Details  Name: ICHOLAS IRBY MRN: [id number] Date of Birth: 1946/05/13   Medicare Important Message Given:  Yes    Loann Quill 04/10/2016, 9:39 AM

## 2016-04-10 NOTE — Progress Notes (Addendum)
      [address]       [place] [address]             [phone]       2 Days Post-Op Procedure(s) (LRB): VIDEO ASSISTED THORACOSCOPY (VATS)/WEDGE RESECTION (Left) LYMPH NODE DISSECTION (N/A)  Subjective: Patient sitting in chair. Has intermittent cough. No other complaints.  Objective: Vital signs in last 24 hours: Temp:  [98.3 F (36.8 C)-99.7 F (37.6 C)] 99 F (37.2 C) (06/02 0442) Pulse Rate:  [93-109] 96 (06/02 0442) Cardiac Rhythm:  [-] Normal sinus rhythm (06/02 0500) Resp:  [16-22] 20 (06/02 0442) BP: (121-142)/(72-96) 123/76 mmHg (06/02 0442) SpO2:  [92 %-95 %] 92 % (06/02 0442)     Intake/Output from previous day: 06/01 0701 - 06/02 0700 In: 1440 [P.O.:840; I.V.:600] Out: 3000 [Urine:2750; Chest Tube:250]   Physical Exam:  Cardiovascular: RRR. Pulmonary: Clear to auscultation on the right and slightly diminished on the left;  Abdomen: Soft, non tender, bowel sounds present. Wounds: Some sero sanguinous ooze from left chest tube area Chest Tubes: to water seal and no air leak  Lab Results: CBC: Recent Labs  04/09/16 0415 04/10/16 0521  WBC 9.3 9.6  HGB 12.1* 11.4*  HCT 37.2* 35.3*  PLT 229 209   BMET:  Recent Labs  04/09/16 0415 04/10/16 0521  NA 136 136  K 3.8 3.7  CL 105 105  CO2 23 26  GLUCOSE 122* 136*  BUN 12 11  CREATININE 1.02 1.03  CALCIUM 8.4* 8.2*    PT/INR: No results for input(s): LABPROT, INR in the last 72 hours. ABG:  INR: Will add last result for INR, ABG once components are confirmed Will add last 4 CBG results once components are confirmed  Assessment/Plan:  1. CV - SR in the 90's.  2.  Pulmonary - Chest tube with 250 cc of output last 24 hours. Chest tube is to water seal and there is no air leak. CXR this am shows no pneumothorax, stable cardiomegaly. Hope to remove one chest tube. Encourage incentive spirometer. Continue Dulera and Spiriva. 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 Dr. Roxan Hockey to discuss path with patient. 3. Anemia-H and H stable at 11.4 and 35.3 4. Supplement potassium 5. CBGs 168/150/231. No previous history of diabetes. Pre op HGA1C 6.1. 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 Stop accu checks and SS PRN. He will need to follow up with primary care medical doctor after discharge  ZIMMERMAN,DONIELLE MPA-C 04/10/2016,8:40 AM  Patient seen and examined, agree with above No air leak- dc anterior CT PATH T1aN0, stage IA- informed patient and daughter  Revonda Standard. Roxan Hockey, MD [facility] [phone]

## 2016-04-10 NOTE — Care Management Note (Signed)
Case Management Note  Patient Details  Name: Miguel Morales MRN: [id number] Date of Birth: 08-31-1946  Subjective/Objective:   Patient is from home with spouse, ambulatory, s/p vats, one chest tube dc'd today, will try to dc the other tomorrow and most likely home on Sunday.  NCM will cont to follow for dc needs.                  Action/Plan:   Expected Discharge Date:  04/13/16               Expected Discharge Plan:  Home/Self Care  In-House Referral:     Discharge planning Services  CM Consult  Post Acute Care Choice:    Choice offered to:     DME Arranged:    DME Agency:     HH Arranged:    HH Agency:     Status of Service:  Completed, signed off  Medicare Important Message Given:  Yes Date Medicare IM Given:    Medicare IM give by:    Date Additional Medicare IM Given:    Additional Medicare Important Message give by:     If discussed at [place] of Stay Meetings, dates discussed:    Additional Comments:  Zenon Mayo, RN 04/10/2016, 5:48 PM

## 2016-04-11 ENCOUNTER — Inpatient Hospital Stay (HOSPITAL_COMMUNITY): Payer: Medicare Other

## 2016-04-11 NOTE — Progress Notes (Addendum)
      [address]       [place] [address]             [phone]      3 Days Post-Op Procedure(s) (LRB): VIDEO ASSISTED THORACOSCOPY (VATS)/WEDGE RESECTION (Left) LYMPH NODE DISSECTION (N/A)   Subjective:  No complaints.  Doing well, ambulating around hallway without difficulty.  +BM  Objective: Vital signs in last 24 hours: Temp:  [98.4 F (36.9 C)-99.4 F (37.4 C)] 98.4 F (36.9 C) (06/03 0812) Pulse Rate:  [85-106] 104 (06/03 0816) Cardiac Rhythm:  [-] Sinus tachycardia (06/03 0816) Resp:  [16-95] 18 (06/03 0816) BP: (119-149)/(80-87) 119/87 mmHg (06/03 0816) SpO2:  [92 %-97 %] 95 % (06/03 0816)  Intake/Output from previous day: 06/02 0701 - 06/03 0700 In: 716 [P.O.:360; I.V.:356] Out: 1520 [Urine:1400; Chest Tube:120] Intake/Output this shift: Total I/O In: 262.7 [P.O.:240; I.V.:22.7] Out: -   General appearance: alert, cooperative and no distress Heart: regular rate and rhythm Lungs: clear to auscultation bilaterally Abdomen: soft, non-tender; bowel sounds normal; no masses,  no organomegaly Wound: clean and dry  Lab Results:  Recent Labs  04/09/16 0415 04/10/16 0521  WBC 9.3 9.6  HGB 12.1* 11.4*  HCT 37.2* 35.3*  PLT 229 209   BMET:  Recent Labs  04/09/16 0415 04/10/16 0521  NA 136 136  K 3.8 3.7  CL 105 105  CO2 23 26  GLUCOSE 122* 136*  BUN 12 11  CREATININE 1.02 1.03  CALCIUM 8.4* 8.2*    PT/INR: No results for input(s): LABPROT, INR in the last 72 hours. ABG    Component Value Date/Time   PHART 7.428 04/09/2016 0500   HCO3 23.0 04/09/2016 0500   TCO2 24.1 04/09/2016 0500   ACIDBASEDEF 0.9 04/09/2016 0500   O2SAT 92.5 04/09/2016 0500   CBG (last 3)   Recent Labs  04/09/16 1650 04/09/16 2128 04/10/16 0847  GLUCAP 150* 231* 137*    Assessment/Plan: S/P Procedure(s) (LRB): VIDEO ASSISTED THORACOSCOPY (VATS)/WEDGE RESECTION (Left) LYMPH NODE DISSECTION (N/A)  1. Chest tube- no air leak on water seal,  120 cc output yesterday 2. Pulm- CXR with trace apical pneumothorax on left, + atelectasis, continue IS 3. CV-NSR, HTN controlled- continue home Norvasc 4. Dispo- patient stable, ? Small apical pneumothorax on left... No air leak on chest tube... Possibly remove today vs. tomorrow   LOS: 3 days    BARRETT, ERIN 04/11/2016  I have seen and examined the patient and agree with the assessment and plan as outlined.  Will tentatively plan to remove last tube tomorrow if CXR stable and drainage low.  D/C PCA and IV fluids.  Rexene Alberts, MD 04/11/2016 11:36 AM

## 2016-04-12 ENCOUNTER — Inpatient Hospital Stay (HOSPITAL_COMMUNITY): Payer: Medicare Other

## 2016-04-12 NOTE — Progress Notes (Addendum)
      [address]       [place] [address]             [phone]      4 Days Post-Op Procedure(s) (LRB): VIDEO ASSISTED THORACOSCOPY (VATS)/WEDGE RESECTION (Left) LYMPH NODE DISSECTION (N/A)   Subjective:  Patient is doing great, no complaints.  Continues to ambulate independently around the unit.  Objective: Vital signs in last 24 hours: Temp:  [98.2 F (36.8 C)-99.2 F (37.3 C)] 98.2 F (36.8 C) (06/04 0712) Pulse Rate:  [92-102] 92 (06/04 0712) Cardiac Rhythm:  [-] Normal sinus rhythm (06/04 0806) Resp:  [15-31] 31 (06/04 0712) BP: (116-138)/(81-87) 138/82 mmHg (06/04 0712) SpO2:  [93 %-98 %] 93 % (06/04 0712)  Intake/Output from previous day: 06/03 0701 - 06/04 0700 In: 1492.7 [P.O.:1440; I.V.:52.7] Out: 1270 [Urine:1100; Chest Tube:170] Intake/Output this shift: Total I/O In: -  Out: 10 [Chest Tube:10]  General appearance: alert, cooperative and no distress Heart: regular rate and rhythm Lungs: clear to auscultation bilaterally Abdomen: soft, non-tender; bowel sounds normal; no masses,  no organomegaly Wound: clean and dr  Lab Results:  Recent Labs  04/10/16 0521  WBC 9.6  HGB 11.4*  HCT 35.3*  PLT 209   BMET:  Recent Labs  04/10/16 0521  NA 136  K 3.7  CL 105  CO2 26  GLUCOSE 136*  BUN 11  CREATININE 1.03  CALCIUM 8.2*    PT/INR: No results for input(s): LABPROT, INR in the last 72 hours. ABG    Component Value Date/Time   PHART 7.428 04/09/2016 0500   HCO3 23.0 04/09/2016 0500   TCO2 24.1 04/09/2016 0500   ACIDBASEDEF 0.9 04/09/2016 0500   O2SAT 92.5 04/09/2016 0500   CBG (last 3)   Recent Labs  04/09/16 1650 04/09/16 2128 04/10/16 0847  GLUCAP 150* 231* 137*    Assessment/Plan: S/P Procedure(s) (LRB): VIDEO ASSISTED THORACOSCOPY (VATS)/WEDGE RESECTION (Left) LYMPH NODE DISSECTION (N/A)  1. Chest tube- no air leak, 170 cc output yesterday- on water seal 2. CV- NSR, HTN controlled on home Norvasc 3.  Dispo- patient stable, CXR ordered for 6 AM was not completed, will review once completed and can hopefully remove final chest tube today   LOS: 4 days    BARRETT, ERIN 04/12/2016  I have seen and examined the patient and agree with the assessment and plan as outlined.  CXR stable.  D/C tube.  Possible D/C home 1-2 days.  Rexene Alberts, MD 04/12/2016 2:31 PM

## 2016-04-13 ENCOUNTER — Inpatient Hospital Stay (HOSPITAL_COMMUNITY): Payer: Medicare Other

## 2016-04-13 MED ORDER — TRAMADOL HCL 50 MG PO TABS: 50.0000 mg | ORAL_TABLET | Freq: Four times a day (QID) | ORAL | Status: DC | PRN

## 2016-04-13 NOTE — Progress Notes (Addendum)
      [address]       [place] [address]             [phone]       5 Days Post-Op Procedure(s) (LRB): VIDEO ASSISTED THORACOSCOPY (VATS)/WEDGE RESECTION (Left) LYMPH NODE DISSECTION (N/A)  Subjective: Patient without complaints this am  Objective: Vital signs in last 24 hours: Temp:  [97.9 F (36.6 C)-98.6 F (37 C)] 98.6 F (37 C) (06/05 0717) Pulse Rate:  [70-107] 100 (06/05 0717) Cardiac Rhythm:  [-] Sinus tachycardia (06/04 1912) Resp:  [15-24] 21 (06/05 0717) BP: (117-149)/(82-97) 126/97 mmHg (06/05 0717) SpO2:  [93 %-98 %] 93 % (06/05 0717)     Intake/Output from previous day: 06/04 0701 - 06/05 0700 In: 480 [P.O.:480] Out: 730 [Urine:700; Chest Tube:30]   Physical Exam:  Cardiovascular: RRR. Pulmonary: Clear to auscultation on the right and slightly diminished on the left base Abdomen: Soft, non tender, bowel sounds present. Wounds: Clean and dry. Skin tear on abdomen from tape   Lab Results: CBC:No results for input(s): WBC, HGB, HCT, PLT in the last 72 hours. BMET: No results for input(s): NA, K, CL, CO2, GLUCOSE, BUN, CREATININE, CALCIUM in the last 72 hours.  PT/INR: No results for input(s): LABPROT, INR in the last 72 hours. ABG:  INR: Will add last result for INR, ABG once components are confirmed Will add last 4 CBG results once components are confirmed  Assessment/Plan:  1. CV - SR in the 90's.  2.  Pulmonary - On room air. CXR this am shows no pneumothorax, minor subcutaneous emphysema left lower lateral chest wallEncourage incentive spirometer. Continue Dulera and Spiriva.  3. Anemia-H and H stable at 11.4 and 35.3 4. Supplement potassium 5. Remove central line 6. Likely discharge home  [place] MPA-C 04/13/2016,7:59 AM  Patient seen and examined, agree with above Dc home  [place]. Roxan Hockey, MD [facility] [phone]

## 2016-04-13 NOTE — Care Management Important Message (Signed)
Important Message  Patient Details  Name: Miguel Morales MRN: [id number] Date of Birth: [birth year]   Medicare Important Message Given:  Yes    Nathen May 04/13/2016, 12:05 PM

## 2016-04-13 NOTE — Progress Notes (Signed)
Pt given discharge packet, education given on care of incision and medication regimen. Family and patient state no further education at this time. Will continue to monitor.

## 2016-04-14 ENCOUNTER — Ambulatory Visit: Payer: Medicare Other | Admitting: Internal Medicine

## 2016-04-16 ENCOUNTER — Encounter (HOSPITAL_COMMUNITY): Payer: Self-pay

## 2016-04-23 ENCOUNTER — Other Ambulatory Visit: Payer: Self-pay | Admitting: Thoracic Surgery (Cardiothoracic Vascular Surgery)

## 2016-04-23 DIAGNOSIS — C3490 Malignant neoplasm of unspecified part of unspecified bronchus or lung: Secondary | ICD-10-CM

## 2016-04-27 ENCOUNTER — Other Ambulatory Visit: Payer: Self-pay | Admitting: Family Medicine

## 2016-04-27 DIAGNOSIS — G4733 Obstructive sleep apnea (adult) (pediatric): Secondary | ICD-10-CM | POA: Diagnosis not present

## 2016-04-27 DIAGNOSIS — I10 Essential (primary) hypertension: Secondary | ICD-10-CM | POA: Diagnosis not present

## 2016-04-27 DIAGNOSIS — E782 Mixed hyperlipidemia: Secondary | ICD-10-CM | POA: Diagnosis not present

## 2016-04-28 ENCOUNTER — Ambulatory Visit
Admission: RE | Admit: 2016-04-28 | Discharge: 2016-04-28 | Disposition: A | Discharge status: Home / Self Care | Payer: Medicare Other | Source: Ambulatory Visit | Attending: Thoracic Surgery (Cardiothoracic Vascular Surgery) | Admitting: Thoracic Surgery (Cardiothoracic Vascular Surgery)

## 2016-04-28 ENCOUNTER — Encounter: Payer: Self-pay | Admitting: Thoracic Surgery (Cardiothoracic Vascular Surgery)

## 2016-04-28 ENCOUNTER — Ambulatory Visit (INDEPENDENT_AMBULATORY_CARE_PROVIDER_SITE_OTHER): Payer: Self-pay | Admitting: Thoracic Surgery (Cardiothoracic Vascular Surgery)

## 2016-04-28 VITALS — BP 125/89 | HR 99 | Resp 18 | Ht 66.0 in | Wt 212.0 lb

## 2016-04-28 DIAGNOSIS — C3490 Malignant neoplasm of unspecified part of unspecified bronchus or lung: Secondary | ICD-10-CM

## 2016-04-28 DIAGNOSIS — J9811 Atelectasis: Secondary | ICD-10-CM | POA: Diagnosis not present

## 2016-04-28 DIAGNOSIS — C3492 Malignant neoplasm of unspecified part of left bronchus or lung: Secondary | ICD-10-CM

## 2016-04-28 DIAGNOSIS — Z09 Encounter for follow-up examination after completed treatment for conditions other than malignant neoplasm: Secondary | ICD-10-CM

## 2016-04-28 NOTE — Progress Notes (Signed)
[address]       [place] [address]             [phone]       HPI: [name] returns today for scheduled postoperative follow-up visit.  He is a 70 year old man with a remote history of tobacco abuse who underwent a thoracoscopic lingular segmentectomy on 04/08/2016 for a stage IA adenocarcinoma. Postoperatively he did well without any major complications.  He feels well. He says is not having any incisional pain. He has noted some redness around his chest tube stitches. He complains of some numbness along the left costal margin. He has not had any problems with his breathing.  Past Medical History  Diagnosis Date  . COPD (chronic obstructive pulmonary disease) ([place])   . Mixed hyperlipidemia   . GERD (gastroesophageal reflux disease)   . Asthma   . Spinal stenosis   . Shortness of breath dyspnea     with exertion  . Hypertension   . Sleep apnea     will sometimes wear a CPAP  . History of bronchitis   . Nocturia   . Arthritis   . Cancer [facility])     skin     Current Outpatient Prescriptions  Medication Sig Dispense Refill  . acetaminophen (TYLENOL) 500 MG tablet Take 500 mg by mouth every 6 (six) hours as needed.    Marland Kitchen albuterol (PROVENTIL HFA;VENTOLIN HFA) 108 (90 Base) MCG/ACT inhaler Inhale 2 puffs into the lungs every 6 (six) hours as needed for wheezing. 3 Inhaler 2  . amLODipine (NORVASC) 10 MG tablet Take 1 tablet (10 mg total) by mouth daily. 90 tablet 1  . Ascorbic Acid (VITAMIN C) 1000 MG tablet Take 1,000 mg by mouth daily.    Marland Kitchen aspirin 81 MG tablet Take 81 mg by mouth daily.    Marland Kitchen atorvastatin (LIPITOR) 40 MG tablet Take 1 tablet (40 mg total) by mouth daily. 90 tablet 1  . budesonide-formoterol (SYMBICORT) 160-4.5 MCG/ACT inhaler Inhale 2 puffs into the lungs 2 (two) times daily.    . Coenzyme Q10 (COQ10) 400 MG CAPS Take 1 capsule by mouth daily.     . Flaxseed, Linseed, (FLAXSEED OIL) 1000 MG CAPS Take 1 capsule by mouth daily.      . fluticasone (FLONASE) 50 MCG/ACT nasal spray USE 1 SPRAY IN EACH NOSTRIL DAILY AS NEEDED 16 g 4  . ibuprofen (ADVIL,MOTRIN) 200 MG tablet Take 800 mg by mouth 2 (two) times daily as needed.     Marland Kitchen ketotifen (ZADITOR) 0.025 % ophthalmic solution Place 1 drop into both eyes 2 (two) times daily as needed.    . lidocaine (XYLOCAINE) 5 % ointment Apply 1 application topically as needed for mild pain (For Back and hip).     . loratadine (CLARITIN) 10 MG tablet Take 10 mg by mouth daily as needed for allergies.    . Magnesium 250 MG TABS Take 1 tablet by mouth daily.    . mometasone (ELOCON) 0.1 % lotion APPLY TOPICALLY DAILY 60 mL 3  . omeprazole (PRILOSEC) 20 MG capsule TAKE 1 CAPSULE DAILY AS NEEDED 90 capsule 1  . SPIRIVA HANDIHALER 18 MCG inhalation capsule INHALE THE CONTENTS OF 1 CAPSULE DAILY 90 capsule 1  . tamsulosin (FLOMAX) 0.4 MG CAPS capsule Take 1 tablet by mouth at bedtime. 90 capsule 1  . triamcinolone (KENALOG) 0.025 % cream Apply 1 application topically 2 (two) times daily.    . Turmeric Curcumin 500  MG CAPS Take 1 capsule by mouth daily.    . traMADol (ULTRAM) 50 MG tablet Take 1 tablet (50 mg total) by mouth every 6 (six) hours as needed (mild pain). (Patient not taking: Reported on 04/28/2016) 30 tablet 0   No current facility-administered medications for this visit.    Physical Exam BP 125/89 mmHg  Pulse 99  Resp 18  Ht '5\' 6"'$  (1.676 m)  Wt 212 lb (96.163 kg)  BMI 34.23 kg/m2  SpO28 33% 70 year old man in no acute distress Alert and oriented 3 with no focal deficits Incision is healing well. Chest tube site with erythema around sutures Lungs clear with equal breath sounds bilaterally Cardiac regular rate and rhythm normal S1 and S2 No peripheral edema  Diagnostic Tests: CHEST 2 VIEW  COMPARISON: Chest x-ray of April 13, 2016  FINDINGS: The lungs are mildly hypoinflated. The right lung is clear. There is decreased interstitial density in the left lung. There  is persistent thickening of the major fissure on the left and increased density in the anterior aspect of the lower hemi thorax. There is no significant pleural effusion. There is no pneumothorax. The heart is normal in size. The pulmonary vascularity is not engorged. There is multilevel degenerative disc disease of the thoracic spine.  IMPRESSION: Postsurgical changes on the left with decreased interstitial edema and left basilar atelectasis.   Electronically Signed  By: David Martinique M.D.  On: 04/28/2016 12:06  Impression: 70 year old man who is now 3 weeks out from a thoracoscopic left upper lobe lingular segmentectomy. He is doing extremely well with minimal discomfort. He is not taking any pain medication.  He has stage IA disease. He will not need adjuvant chemotherapy, but we will get him in to see Dr. Julien Nordmann at the cancer center for follow-up.  He may begin driving. Appropriate precautions were discussed.  There are no restrictions on his activities at this time.  He quit smoking about 12 years ago.  Plan: Referral to Dr. Julien Nordmann at [facility]  Return in 2 months with PA and lateral chest x-ray.  Melrose Nakayama, MD [facility] [phone]

## 2016-04-30 ENCOUNTER — Telehealth: Payer: Self-pay | Admitting: *Deleted

## 2016-04-30 DIAGNOSIS — C3492 Malignant neoplasm of unspecified part of left bronchus or lung: Secondary | ICD-10-CM

## 2016-04-30 NOTE — Telephone Encounter (Signed)
Oncology Nurse Navigator Documentation  Oncology Nurse Navigator Flowsheets 04/30/2016  Navigator Encounter Type Telephone  Telephone Outgoing Call  Treatment Phase Other  Barriers/Navigation Needs Coordination of Care  Interventions Coordination of Care  Coordination of Care Appts  Acuity Level 1  Time Spent with Patient 15   I received referral from Dr. Leonarda Salon office.  I called to schedule patient to be seen at [facility] on 05/07/16, patient is unable to make it.  He will be seen on 05/14/16 arrival at 12:30.  Patient verbalize understanding of appt time and place.

## 2016-05-13 ENCOUNTER — Telehealth: Payer: Self-pay | Admitting: *Deleted

## 2016-05-13 NOTE — Telephone Encounter (Signed)
Called pt and confirmed 05/14/16 clinic appt w/ him.

## 2016-05-14 ENCOUNTER — Ambulatory Visit (HOSPITAL_BASED_OUTPATIENT_CLINIC_OR_DEPARTMENT_OTHER): Payer: Medicare Other | Admitting: Internal Medicine

## 2016-05-14 ENCOUNTER — Other Ambulatory Visit (HOSPITAL_BASED_OUTPATIENT_CLINIC_OR_DEPARTMENT_OTHER): Payer: Medicare Other

## 2016-05-14 ENCOUNTER — Telehealth: Payer: Self-pay | Admitting: Internal Medicine

## 2016-05-14 ENCOUNTER — Ambulatory Visit: Payer: Medicare Other | Attending: Internal Medicine | Admitting: Physical Therapy

## 2016-05-14 VITALS — BP 137/59 | HR 92 | Temp 98.6°F | Resp 18 | Ht 66.0 in | Wt 214.5 lb

## 2016-05-14 DIAGNOSIS — M545 Low back pain, unspecified: Secondary | ICD-10-CM

## 2016-05-14 DIAGNOSIS — R293 Abnormal posture: Secondary | ICD-10-CM | POA: Insufficient documentation

## 2016-05-14 DIAGNOSIS — C3492 Malignant neoplasm of unspecified part of left bronchus or lung: Secondary | ICD-10-CM

## 2016-05-14 DIAGNOSIS — C3412 Malignant neoplasm of upper lobe, left bronchus or lung: Secondary | ICD-10-CM

## 2016-05-14 LAB — COMPREHENSIVE METABOLIC PANEL
ALK PHOS: 83 U/L (ref 40–150)
ALT: 17 U/L (ref 0–55)
ANION GAP: 12 meq/L — AB (ref 3–11)
AST: 18 U/L (ref 5–34)
Albumin: 3.7 g/dL (ref 3.5–5.0)
BUN: 14.8 mg/dL (ref 7.0–26.0)
CALCIUM: 9.1 mg/dL (ref 8.4–10.4)
CHLORIDE: 106 meq/L (ref 98–109)
CO2: 22 mEq/L (ref 22–29)
Creatinine: 1.1 mg/dL (ref 0.7–1.3)
EGFR: 71 mL/min/{1.73_m2} — AB (ref >90)
Glucose: 197 mg/dl — ABNORMAL HIGH (ref 70–140)
POTASSIUM: 3.4 meq/L — AB (ref 3.5–5.1)
Sodium: 140 mEq/L (ref 136–145)
Total Bilirubin: 0.95 mg/dL (ref 0.20–1.20)
Total Protein: 6.8 g/dL (ref 6.4–8.3)

## 2016-05-14 LAB — CBC WITH DIFFERENTIAL/PLATELET
BASO%: 0.9 % (ref 0.0–2.0)
BASOS ABS: 0.1 10*3/uL (ref 0.0–0.1)
EOS ABS: 0.2 10*3/uL (ref 0.0–0.5)
EOS%: 3.6 % (ref 0.0–7.0)
HCT: 39.1 % (ref 38.4–49.9)
HGB: 12.9 g/dL — ABNORMAL LOW (ref 13.0–17.1)
LYMPH#: 1.4 10*3/uL (ref 0.9–3.3)
LYMPH%: 23.6 % (ref 14.0–49.0)
MCH: 30.3 pg (ref 27.2–33.4)
MCHC: 32.9 g/dL (ref 32.0–36.0)
MCV: 91.9 fL (ref 79.3–98.0)
MONO#: 0.4 10*3/uL (ref 0.1–0.9)
MONO%: 6.1 % (ref 0.0–14.0)
NEUT#: 4 10*3/uL (ref 1.5–6.5)
NEUT%: 65.8 % (ref 39.0–75.0)
Platelets: 246 10*3/uL (ref 140–400)
RBC: 4.26 10*6/uL (ref 4.20–5.82)
RDW: 13.7 % (ref 11.0–14.6)
WBC: 6.1 10*3/uL (ref 4.0–10.3)

## 2016-05-14 NOTE — Telephone Encounter (Signed)
per pof to sch pt appt-gave pt copy of avs °

## 2016-05-14 NOTE — Therapy (Signed)
[place], [place], [address] Phone: [phone]   Fax:  [phone]  Physical Therapy Evaluation  Patient Details  Name: Miguel Morales MRN: [id number] Date of Birth: [birth year] Referring Provider: Dr. Curt Bears  Encounter Date: 05/14/2016      PT End of Session - 05/14/16 1443    Visit Number 1   Number of Visits 1   PT Start Time 1740   PT Stop Time 1405   PT Time Calculation (min) 30 min   Activity Tolerance Patient tolerated treatment well   Behavior During Therapy [facility] for tasks assessed/performed      Past Medical History  Diagnosis Date  . COPD (chronic obstructive pulmonary disease) ([place])   . Mixed hyperlipidemia   . GERD (gastroesophageal reflux disease)   . Asthma   . Spinal stenosis   . Shortness of breath dyspnea     with exertion  . Hypertension   . Sleep apnea     will sometimes wear a CPAP  . History of bronchitis   . Nocturia   . Arthritis   . Cancer [facility])     skin    Past Surgical History  Procedure Laterality Date  . Knee arthroscopy Right 2004  . Septoplasty    . Circumcision    . Colonoscopy  03/04/2004    CXK:GYJE-HUDJS diverticula.  The remainder of the colonic mucosa appeared normal/normal rectum  . Colonoscopy  05/22/2009    HFW:YOVZCHY mucosa appeared normal/Left-sided diverticula/Normal rectum  . Colonoscopy N/A 08/02/2014    Procedure: COLONOSCOPY;  Surgeon: Daneil Dolin, MD;  [facility];  Service: Endoscopy;  Laterality: N/A;  2:00 PM  . Video bronchoscopy with endobronchial navigation N/A 02/26/2016    Procedure: VIDEO BRONCHOSCOPY WITH ENDOBRONCHIAL NAVIGATION LEFT UPPER LOBE LUNG NODULE;  Surgeon: [name], MD;  Location: [place];  Service: Thoracic;  Laterality: N/A;  . Video assisted thoracoscopy (vats)/wedge resection Left 04/08/2016    Procedure: VIDEO ASSISTED THORACOSCOPY (VATS)/WEDGE RESECTION;  Surgeon: Melrose Nakayama, MD;  Location: [place];  Service: Thoracic;  Laterality: Left;  . Lymph node dissection N/A 04/08/2016    Procedure: LYMPH NODE DISSECTION;  Surgeon: Melrose Nakayama, MD;  Location: [place];  Service: Thoracic;  Laterality: N/A;    There were no vitals filed for this visit.       Subjective Assessment - 05/14/16 1408    Subjective Just a little pain since surgery; will be released after six weeks for more activity (now at over five weeks past surgery).   Patient is accompained by: Family member  wife, a Marine scientist at [employer]   Pertinent History Pt. had been followed with scans due to h/o left lung nodule.  Had VATS wedge resection on 04/08/16 for left upper lobe adenocarcinoma, 1.1 cm., stage IA.  Will be followed with observation; no other treatment planned.   Patient Stated Goals get info from all lung clinic providers   Currently in Pain? Yes   Pain Score 4   up to 4/10, not constant   Pain Location Back   Pain Orientation Lower   Pain Type Chronic pain   Pain Onset More than a month ago   Pain Frequency Other (Comment)  most days   Aggravating Factors  standing   Pain Relieving Factors sitting down; epidural helped for several weeks            [facility] PT Assessment - 05/14/16 0001  Assessment   Medical Diagnosis left upper lobe adenocarcinoma, stage Ia   Referring Provider Dr. Curt Bears   Onset Date/Surgical Date 04/08/16  VATS wedge resection   Precautions   Precautions Other (comment)   Precaution Comments cancer precautions   Restrictions   Weight Bearing Restrictions No   Balance Screen   Has the patient fallen in the past 6 months No   Has the patient had a decrease in activity level because of a fear of falling?  No   Is the patient reluctant to leave their home because of a fear of falling?  No   Home Environment   Living Environment Private residence   Living Arrangements Spouse/significant other   Type of [place] to live  on main level with bedroom/bathroom;Laundry or work area in basement;Multi-level   Prior Function   Level of Independence Independent   Leisure walks 3-5 days a week for 30  minutes, 1.5 miles   Cognition   Overall Cognitive Status Within Functional Limits for tasks assessed   Observation/Other Assessments   Observations Pleasant and engaged gentleman accompanied by his wife today   Functional Tests   Functional tests Sit to Stand   Sit to Stand   Comments 10 times in 30 seconds, below average for age  moderate dyspnea after this   Posture/Postural Control   Posture/Postural Control Postural limitations   Postural Limitations Rounded Shoulders;Forward head   ROM / Strength   AROM / PROM / Strength AROM   AROM   Overall AROM Comments IN standing, trunk AROM:  flexion--reaches 4 inches to floor; other motions WFL with slight end range stiffness   Ambulation/Gait   Ambulation/Gait Yes   Ambulation/Gait Assistance 7: Independent   Balance   Balance Assessed Yes   Dynamic Standing Balance   Dynamic Standing - Comments reaches forward 10 inches in standing, below average for age                           PT Education - 05/14/16 1417    Education provided Yes   Education Details posture, breathing, walking, Cure article on staying active, [employer] at the [place], Trail to recovery program info   Person(s) Educated Patient;Spouse   Methods Explanation;Handout   Comprehension Verbalized understanding               Lung Clinic Goals - 05/14/16 1449    Patient will be able to verbalize understanding of the benefit of exercise to decrease fatigue.   Status Achieved   Patient will be able to verbalize the importance of posture.   Status Achieved   Patient will be able to demonstrate diaphragmatic breathing for improved lung function.   Status Achieved   Patient will be able to verbalize understanding of the role of physical therapy to prevent functional decline  and who to contact if physical therapy is needed.   Status Achieved             Plan - 05/14/16 1443    Clinical Impression Statement Very pleasant gentleman with stage Ia lung cancer, s/p wedge resection.  He does some regular exercise.  He has slightly limited trunk flexion, below average forward reach in standing and 30 second sit to stand; posture is forward head, slight rounded shoulder.  He has backpain from spinal stenosis.  He had SOB with bending forward and with sit to stand.  Status was discussed prior to  evaluation at multidisciplinary conference.   Rehab Potential Good   PT Frequency One time visit   PT Treatment/Interventions Patient/family education   PT Next Visit Plan None at this time.   PT Home Exercise Plan walking, breathing exercises   Recommended Other Services [facility], [facility]   Consulted and Agree with Plan of Care Patient      Patient will benefit from skilled therapeutic intervention in order to improve the following deficits and impairments:  Cardiopulmonary status limiting activity, Postural dysfunction, Pain  Visit Diagnosis: Abnormal posture  Midline low back pain without sciatica      G-Codes - 06/11/16 1450    Functional Assessment Tool Used clinical judgement   Functional Limitation Mobility: Walking and moving around   Mobility: Walking and Moving Around Current Status [phone]) At least 1 percent but less than 20 percent impaired, limited or restricted   Mobility: Walking and Moving Around Goal Status [phone]) At least 20 percent but less than 40 percent impaired, limited or restricted   Mobility: Walking and Moving Around Discharge Status [phone]) At least 1 percent but less than 20 percent impaired, limited or restricted       Problem List Patient Active Problem List   Diagnosis Date Noted  . Nodule of left lung 04/08/2016  . Moderate COPD (chronic obstructive pulmonary disease) ([place]) 03/18/2016  . Non-small cell  carcinoma of left lung, stage 1 ([place]) 03/18/2016  . Dyspnea 07/04/2014  . Lung nodule 07/04/2014  . Hyperglycemia 04/12/2014  . Lung nodule 107m Left Upper Lobe Ground Glass and 456mRight Right Lower lobe - seen April 2015 and NEW problem 02/25/2014  . Cancer screening 01/13/2014  . Lumbar stenosis with neurogenic claudication 11/07/2013  . Sacroiliac joint dysfunction 11/07/2013  . HTN (hypertension), benign 10/24/2013  . Spinal stenosis, lumbar region, with neurogenic claudication 06/20/2013  . Shortness of breath 03/23/2012  . Mixed hyperlipidemia 03/23/2012    SALISBURY,DONNA 7/August 03, 20172:53 PM  [id number]: [phone] Fax:  [phone]: JaTRUETT MCFARLANRN: [id number] of Birth: 6/10-14-1947 DoSerafina RoyalsPT 0708-01-2016:53 PM

## 2016-05-14 NOTE — Progress Notes (Signed)
[place] [phone]   [phone] Multidisciplinary thoracic oncology clinic  CONSULT NOTE  REFERRING PHYSICIAN: Dr. Modesto Charon  REASON FOR CONSULTATION:  70 years old white male recently diagnosed with lung cancer.  HPI Miguel Morales is a 70 y.o. male with past medical history significant for hypertension, dyslipidemia, spinal stenosis, hyperglycemia, COPD as well as long history of smoking but quit 10 years ago. For the last 2 years the patient has been followed closely for questionable left upper lobe lung nodule by Dr. Chase Caller. He had previous scans of the chest as well as PET scan that showed low activity and he was followed by observation. In March 2017 he started having more cough. CT scan of the chest on 01/29/2016 showed stable left upper lobe ground glass nodule measuring 1.5 x 1.2 cm. This had increased slightly in size since the previous CT scan of the chest in March 2015. The patient was seen by Dr. Lamonte Sakai and had bronchoscopy with electromagnetic navigational bronchoscopy on 02/26/2016. The final cytology (Accession: [id number]) showed malignant cells consistent with adenocarcinoma. PET scan on 03/06/2016 showed low metabolic activity associated with the left upper lobe pulmonary nodule with minimal increase from the previous PET-CT on 06/08/2014. No evidence of metastatic disease. The patient was referred to Dr. Roxan Hockey and on 04/08/2016 he underwent left VATS, thoracoscopic left upper lobe lingular segmentectomy and mediastinal lymph node dissection. The final pathology (Accession: [id number]) showed adenocarcinoma measuring 1.1 cm with negative margin and negative lymphovascular and pleural invasion. There was no evidence of malignancy in the dissected lymph nodes. The distant to the closest margin was 0.5 cm. Molecular study showed PDL 1 expression of 10%. The tumor was also negative for EGFR, ALK, ROS 1 and BR AF mutations. Dr.  Roxan Hockey kindly referred the patient to the multidisciplinary thoracic oncology clinic today for evaluation and recommendation regarding his condition. When seen today the patient is feeling fine with no specific complaints except for mild cough and soreness in the left side of the chest from the surgical scar. He recovered very well from his recent surgery. He denied having any significant weight loss or night sweats. He has no nausea, vomiting, diarrhea or constipation. He has occasional headache but no visual changes. Family history significant for mother with stomach cancer and died at age 75 father had several TIA and died from old age at age 7. The patient is married and has 2 children. He was accompanied today by his wife Miguel Morales. 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 He has a history of smoking 1.5 pack per day for around 40 years and quit 10 years ago. He drinks alcohol occasionally and no history of drug abuse.    HPI  Past Medical History  Diagnosis Date  . COPD (chronic obstructive pulmonary disease) ([place])   . Mixed hyperlipidemia   . GERD (gastroesophageal reflux disease)   . Asthma   . Spinal stenosis   . Shortness of breath dyspnea     with exertion  . Hypertension   . Sleep apnea     will sometimes wear a CPAP  . History of bronchitis   . Nocturia   . Arthritis   . Cancer [facility])     skin    Past Surgical History  Procedure Laterality Date  . Knee arthroscopy Right 2004  . Septoplasty    . Circumcision    . Colonoscopy  03/04/2004  ZOX:[id number] diverticula.  The remainder of the colonic mucosa appeared normal/normal rectum  . Colonoscopy  05/22/2009    WJX:BJYNWGN mucosa appeared normal/Left-sided diverticula/Normal rectum  . Colonoscopy N/A 08/02/2014    Procedure: COLONOSCOPY;  Surgeon: Daneil Dolin, MD;  [facility];  Service: Endoscopy;  Laterality: N/A;  2:00 PM  . Video bronchoscopy with  endobronchial navigation N/A 02/26/2016    Procedure: VIDEO BRONCHOSCOPY WITH ENDOBRONCHIAL NAVIGATION LEFT UPPER LOBE LUNG NODULE;  Surgeon: [name], MD;  Location: [place];  Service: Thoracic;  Laterality: N/A;  . Video assisted thoracoscopy (vats)/wedge resection Left 04/08/2016    Procedure: VIDEO ASSISTED THORACOSCOPY (VATS)/WEDGE RESECTION;  Surgeon: Melrose Nakayama, MD;  Location: [place];  Service: Thoracic;  Laterality: Left;  . Lymph node dissection N/A 04/08/2016    Procedure: LYMPH NODE DISSECTION;  Surgeon: Melrose Nakayama, MD;  Location: [facility] OR;  Service: Thoracic;  Laterality: N/A;    Family History  Problem Relation Age of Onset  . Cancer Mother     Social History Social History  Substance Use Topics  . Smoking status: Former Smoker -- 1.00 packs/day for 30 years    Types: Cigarettes    Start date: 11/09/1958    Quit date: 11/10/2003  . Smokeless tobacco: Never Used  . Alcohol Use: 0.0 oz/week    0 Standard drinks or equivalent per week     Comment: Occasionally    Allergies  Allergen Reactions  . Adhesive [Tape] Other (See Comments)    blisters  . Sulfa Antibiotics Other (See Comments)    Current Outpatient Prescriptions  Medication Sig Dispense Refill  . acetaminophen (TYLENOL) 500 MG tablet Take 500 mg by mouth every 6 (six) hours as needed.    Marland Kitchen amLODipine (NORVASC) 10 MG tablet Take 1 tablet (10 mg total) by mouth daily. 90 tablet 1  . Ascorbic Acid (VITAMIN C) 1000 MG tablet Take 1,000 mg by mouth daily.    Marland Kitchen aspirin 81 MG tablet Take 81 mg by mouth daily.    Marland Kitchen atorvastatin (LIPITOR) 40 MG tablet Take 1 tablet (40 mg total) by mouth daily. 90 tablet 1  . budesonide-formoterol (SYMBICORT) 160-4.5 MCG/ACT inhaler Inhale 2 puffs into the lungs 2 (two) times daily.    . Coenzyme Q10 (COQ10) 400 MG CAPS Take 1 capsule by mouth daily.     . Flaxseed, Linseed, (FLAXSEED OIL) 1000 MG CAPS Take 1 capsule by mouth daily.    Marland Kitchen ibuprofen (ADVIL,MOTRIN)  200 MG tablet Take 800 mg by mouth 2 (two) times daily as needed.     Marland Kitchen ketotifen (ZADITOR) 0.025 % ophthalmic solution Place 1 drop into both eyes 2 (two) times daily as needed.    . lidocaine (XYLOCAINE) 5 % ointment Apply 1 application topically as needed for mild pain (For Back and hip).     . loratadine (CLARITIN) 10 MG tablet Take 10 mg by mouth daily as needed for allergies.    . Magnesium 250 MG TABS Take 1 tablet by mouth daily.    Marland Kitchen omeprazole (PRILOSEC) 20 MG capsule TAKE 1 CAPSULE DAILY AS NEEDED 90 capsule 1  . SPIRIVA HANDIHALER 18 MCG inhalation capsule INHALE THE CONTENTS OF 1 CAPSULE DAILY 90 capsule 1  . tamsulosin (FLOMAX) 0.4 MG CAPS capsule Take 1 tablet by mouth at bedtime. 90 capsule 1  . Turmeric Curcumin 500 MG CAPS Take 1 capsule by mouth daily.    Marland Kitchen albuterol (PROVENTIL HFA;VENTOLIN HFA) 108 (90 Base) MCG/ACT inhaler  Inhale 2 puffs into the lungs every 6 (six) hours as needed for wheezing. 3 Inhaler 2  . fluticasone (FLONASE) 50 MCG/ACT nasal spray USE 1 SPRAY IN EACH NOSTRIL DAILY AS NEEDED (Patient not taking: Reported on 05/14/2016) 16 g 4  . mometasone (ELOCON) 0.1 % lotion APPLY TOPICALLY DAILY 60 mL 3  . traMADol (ULTRAM) 50 MG tablet Take 1 tablet (50 mg total) by mouth every 6 (six) hours as needed (mild pain). (Patient not taking: Reported on 04/28/2016) 30 tablet 0  . triamcinolone (KENALOG) 0.025 % cream Apply 1 application topically 2 (two) times daily. Reported on 05/14/2016     No current facility-administered medications for this visit.    Review of Systems  Constitutional: negative Eyes: negative Ears, nose, mouth, throat, and face: negative Respiratory: positive for pleurisy/chest pain Cardiovascular: negative Gastrointestinal: negative Genitourinary:negative Integument/breast: negative Hematologic/lymphatic: negative Musculoskeletal:negative Neurological: negative Behavioral/Psych: negative Endocrine: negative Allergic/Immunologic:  negative  Physical Exam  MEZ:AHBUX, healthy, no distress, well nourished and well developed SKIN: skin color, texture, turgor are normal, no rashes or significant lesions HEAD: Normocephalic, No masses, lesions, tenderness or abnormalities EYES: normal, PERRLA EARS: External ears normal, Canals clear OROPHARYNX:no exudate, no erythema and lips, buccal mucosa, and tongue normal  NECK: supple, no adenopathy, no JVD LYMPH:  no palpable lymphadenopathy, no hepatosplenomegaly LUNGS: clear to auscultation , and palpation HEART: regular rate & rhythm, no murmurs and no gallops ABDOMEN:abdomen soft, non-tender, normal bowel sounds and no masses or organomegaly BACK: Back symmetric, no curvature., No CVA tenderness EXTREMITIES:no joint deformities, effusion, or inflammation, no edema, no skin discoloration  NEURO: alert & oriented x 3 with fluent speech, no focal motor/sensory deficits  PERFORMANCE STATUS: ECOG 1  LABORATORY DATA: Lab Results  Component Value Date   WBC 6.1 05/14/2016   HGB 12.9* 05/14/2016   HCT 39.1 05/14/2016   MCV 91.9 05/14/2016   PLT 246 05/14/2016      Chemistry      Component Value Date/Time   NA 140 05/14/2016 1230   NA 136 04/10/2016 0521   NA 138 12/05/2015 0855   K 3.4* 05/14/2016 1230   K 3.7 04/10/2016 0521   CL 105 04/10/2016 0521   CO2 22 05/14/2016 1230   CO2 26 04/10/2016 0521   BUN 14.8 05/14/2016 1230   BUN 11 04/10/2016 0521   BUN 22 12/05/2015 0855   CREATININE 1.1 05/14/2016 1230   CREATININE 1.03 04/10/2016 0521   CREATININE 0.96 11/26/2014 0828      Component Value Date/Time   CALCIUM 9.1 05/14/2016 1230   CALCIUM 8.2* 04/10/2016 0521   ALKPHOS 83 05/14/2016 1230   ALKPHOS 56 04/10/2016 0521   AST 18 05/14/2016 1230   AST 26 04/10/2016 0521   ALT 17 05/14/2016 1230   ALT 16* 04/10/2016 0521   BILITOT 0.95 05/14/2016 1230   BILITOT 0.7 04/10/2016 0521   BILITOT 0.5 12/05/2015 0855       RADIOGRAPHIC STUDIES: Dg Chest 2  View  04/28/2016  CLINICAL DATA:  Status post thoracotomy and wedge resection on the left on Apr 08, 2016; no current complaints; history of asthma -COPD, stage I small cell lung malignancy. EXAM: CHEST  2 VIEW COMPARISON:  Chest x-ray of April 13, 2016 FINDINGS: The lungs are mildly hypoinflated. The right lung is clear. There is decreased interstitial density in the left lung. There is persistent thickening of the major fissure on the left and increased density in the anterior aspect of the lower hemi thorax. There  is no significant pleural effusion. There is no pneumothorax. The heart is normal in size. The pulmonary vascularity is not engorged. There is multilevel degenerative disc disease of the thoracic spine. IMPRESSION: Postsurgical changes on the left with decreased interstitial edema and left basilar atelectasis. Electronically Signed   By: David  Martinique M.D.   On: 04/28/2016 12:06    ASSESSMENT:This is a very pleasant 70 years old white male recently diagnosed with stage IA (T1a, N0, M0) non-small cell lung cancer, adenocarcinoma with negative EGFR, ALK, ROS 1 and BRAF mutations diagnosed in May 2017 status post left upper lobe lingular segmentectomy with lymph node dissection. PDL 1 expression is 10%.   PLAN: I had a lengthy discussion with the patient and his wife today about his current disease stage, prognosis and treatment options. I explained to the patient that there is no overall survival benefit for adjuvant systemic chemotherapy or radiotherapy for his stage IA non-small cell lung cancer. I also explained to the patient that the 5 year survival for patient with a stage IA is around 80%. I recommended for the patient to continue on observation with close monitoring especially with the closest resection margin. I will arrange for the patient to come back for follow-up visit in 6 months for reevaluation with repeat CT scan of the chest. The patient was seen during the multidisciplinary  thoracic oncology clinic today by medical oncology, thoracic navigator, social worker and physical therapist. He was advised to call immediately if he has any concerning symptoms in the interval. The patient voices understanding of current disease status and treatment options and is in agreement with the current care plan.  All questions were answered. The patient knows to call the clinic with any problems, questions or concerns. We can certainly see the patient much sooner if necessary.  Thank you so much for allowing me to participate in the care of Miguel Morales. I will continue to follow up the patient with you and assist in his care.  I spent 40 minutes counseling the patient face to face. The total time spent in the appointment was 60 minutes.  Disclaimer: This note was dictated with voice recognition software. Similar sounding words can inadvertently be transcribed and may not be corrected upon review.   Makalynn Berwanger K. May 14, 2016, 1:44 PM

## 2016-05-15 ENCOUNTER — Encounter: Payer: Self-pay | Admitting: Internal Medicine

## 2016-06-02 ENCOUNTER — Other Ambulatory Visit: Payer: Self-pay | Admitting: Family Medicine

## 2016-06-15 ENCOUNTER — Telehealth: Payer: Self-pay | Admitting: Family Medicine

## 2016-06-15 DIAGNOSIS — Z79899 Other long term (current) drug therapy: Secondary | ICD-10-CM

## 2016-06-15 DIAGNOSIS — E785 Hyperlipidemia, unspecified: Secondary | ICD-10-CM

## 2016-06-15 NOTE — Telephone Encounter (Signed)
Orders ready. Pt notified.  

## 2016-06-15 NOTE — Telephone Encounter (Signed)
Met 7 lipid liver

## 2016-06-15 NOTE — Telephone Encounter (Signed)
Pt is requesting lab orders to be sent over for an upcoming appt on Fri. Pt is wanting to get this done in the morning if possible. Last labs per epic were: psa,bmp,hepatic,and lipid on 12/05/15

## 2016-06-16 DIAGNOSIS — E785 Hyperlipidemia, unspecified: Secondary | ICD-10-CM | POA: Diagnosis not present

## 2016-06-16 DIAGNOSIS — Z79899 Other long term (current) drug therapy: Secondary | ICD-10-CM | POA: Diagnosis not present

## 2016-06-17 LAB — BASIC METABOLIC PANEL
BUN/Creatinine Ratio: 14 (ref 10–24)
BUN: 15 mg/dL (ref 8–27)
CALCIUM: 9.6 mg/dL (ref 8.6–10.2)
CHLORIDE: 103 mmol/L (ref 96–106)
CO2: 21 mmol/L (ref 18–29)
CREATININE: 1.09 mg/dL (ref 0.76–1.27)
GFR calc Af Amer: 78 mL/min/{1.73_m2} (ref >59)
GFR calc non Af Amer: 67 mL/min/{1.73_m2} (ref >59)
GLUCOSE: 114 mg/dL — AB (ref 65–99)
Potassium: 4.3 mmol/L (ref 3.5–5.2)
Sodium: 143 mmol/L (ref 134–144)

## 2016-06-17 LAB — HEPATIC FUNCTION PANEL
ALT: 18 IU/L (ref 0–44)
AST: 23 IU/L (ref 0–40)
Albumin: 4.3 g/dL (ref 3.5–4.8)
Alkaline Phosphatase: 85 IU/L (ref 39–117)
BILIRUBIN, DIRECT: 0.16 mg/dL (ref 0.00–0.40)
Bilirubin Total: 0.5 mg/dL (ref 0.0–1.2)
TOTAL PROTEIN: 6.7 g/dL (ref 6.0–8.5)

## 2016-06-17 LAB — LIPID PANEL
CHOL/HDL RATIO: 4.5 ratio (ref 0.0–5.0)
Cholesterol, Total: 166 mg/dL (ref 100–199)
HDL: 37 mg/dL — ABNORMAL LOW (ref >39)
LDL CALC: 101 mg/dL — AB (ref 0–99)
Triglycerides: 138 mg/dL (ref 0–149)
VLDL CHOLESTEROL CAL: 28 mg/dL (ref 5–40)

## 2016-06-19 ENCOUNTER — Encounter: Payer: Self-pay | Admitting: Family Medicine

## 2016-06-19 ENCOUNTER — Ambulatory Visit (INDEPENDENT_AMBULATORY_CARE_PROVIDER_SITE_OTHER): Payer: Medicare Other | Admitting: Family Medicine

## 2016-06-19 VITALS — BP 118/68 | Ht 66.0 in | Wt 216.6 lb

## 2016-06-19 DIAGNOSIS — R739 Hyperglycemia, unspecified: Secondary | ICD-10-CM

## 2016-06-19 DIAGNOSIS — E782 Mixed hyperlipidemia: Secondary | ICD-10-CM | POA: Diagnosis not present

## 2016-06-19 DIAGNOSIS — I10 Essential (primary) hypertension: Secondary | ICD-10-CM

## 2016-06-19 DIAGNOSIS — R7301 Impaired fasting glucose: Secondary | ICD-10-CM

## 2016-06-19 LAB — POCT GLYCOSYLATED HEMOGLOBIN (HGB A1C): Hemoglobin A1C: 5.7

## 2016-06-19 NOTE — Progress Notes (Signed)
   Subjective:    Patient ID: Miguel Morales, male    DOB: 29-Oct-1946, 70 y.o.   MRN: [id number]  Hypertension  This is a chronic problem. The current episode started more than 1 year ago. Pertinent negatives include no chest pain. Risk factors for coronary artery disease include dyslipidemia and male gender. Treatments tried: norvasc. There are no compliance problems.    On recent lab work his blood sugar is up he states he has not been watching diet as well as he should not been able to exercise is much because of the hot weather he is gaining some weight he knows importance of losing weight and exercising  Hyperlipidemia-recent lab work reviewed with the patient he does take his medicines he knows importance of getting his cholesterol under good control  COPD no recent infections he knows to get his flu shot this fall  Allergies does well with his medications currently  Recent surgery for lung cancer he did well with this he will have a scan in approximately 5-6 months with oncology   Review of Systems  Constitutional: Negative for activity change, appetite change and fatigue.  HENT: Negative for congestion.   Respiratory: Negative for cough.   Cardiovascular: Negative for chest pain.  Gastrointestinal: Negative for abdominal pain.  Endocrine: Negative for polydipsia and polyphagia.  Neurological: Negative for weakness.  Psychiatric/Behavioral: Negative for confusion.       Objective:   Physical Exam  Constitutional: He appears well-nourished. No distress.  Cardiovascular: Normal rate, regular rhythm and normal heart sounds.   No murmur heard. Pulmonary/Chest: Effort normal and breath sounds normal. No respiratory distress.  Musculoskeletal: He exhibits no edema.  Lymphadenopathy:    He has no cervical adenopathy.  Neurological: He is alert.  Psychiatric: His behavior is normal.  Vitals reviewed.  Wellness into the lungs are very clear there are no crackles heart regular  without any murmurs neck no masses no supraclavicular nodes       Assessment & Plan:  Fasting hyperglycemia-A1c looks good Hypertension good control continue current medicines Hyperlipidemia-continue medication watch diet stay active Recent lung cancer no sign of reoccurrence COPD stable follows with specialist Up-to-date on colonoscopy Lab work wellness exam and yearly winter Flu vaccine later this year

## 2016-06-20 ENCOUNTER — Other Ambulatory Visit: Payer: Self-pay | Admitting: Family Medicine

## 2016-06-29 ENCOUNTER — Other Ambulatory Visit: Payer: Self-pay | Admitting: Thoracic Surgery (Cardiothoracic Vascular Surgery)

## 2016-06-29 DIAGNOSIS — C3490 Malignant neoplasm of unspecified part of unspecified bronchus or lung: Secondary | ICD-10-CM

## 2016-06-30 ENCOUNTER — Ambulatory Visit (INDEPENDENT_AMBULATORY_CARE_PROVIDER_SITE_OTHER): Payer: Self-pay | Admitting: Thoracic Surgery (Cardiothoracic Vascular Surgery)

## 2016-06-30 ENCOUNTER — Encounter: Payer: Self-pay | Admitting: Thoracic Surgery (Cardiothoracic Vascular Surgery)

## 2016-06-30 ENCOUNTER — Ambulatory Visit
Admission: RE | Admit: 2016-06-30 | Discharge: 2016-06-30 | Disposition: A | Discharge status: Home / Self Care | Payer: Medicare Other | Source: Ambulatory Visit | Attending: Thoracic Surgery (Cardiothoracic Vascular Surgery) | Admitting: Thoracic Surgery (Cardiothoracic Vascular Surgery)

## 2016-06-30 VITALS — BP 133/80 | HR 76 | Resp 20 | Ht 66.0 in | Wt 216.0 lb

## 2016-06-30 DIAGNOSIS — R0602 Shortness of breath: Secondary | ICD-10-CM | POA: Diagnosis not present

## 2016-06-30 DIAGNOSIS — C3492 Malignant neoplasm of unspecified part of left bronchus or lung: Secondary | ICD-10-CM

## 2016-06-30 DIAGNOSIS — C3490 Malignant neoplasm of unspecified part of unspecified bronchus or lung: Secondary | ICD-10-CM

## 2016-06-30 DIAGNOSIS — Z09 Encounter for follow-up examination after completed treatment for conditions other than malignant neoplasm: Secondary | ICD-10-CM

## 2016-06-30 NOTE — Progress Notes (Signed)
[address]       [place] [address]             [phone]       HPI: Miguel Morales returns today for a scheduled follow-up visit.  He is a 70 year old man with a history of tobacco abuse (quit 2005). He had a thoracoscopic lingular segmentectomy on 04/08/2016 for stage IA adenocarcinoma.  I last saw him in the office in June. He was doing well at that time.  He has continued to do well. He does get short of breath with heavy exertion. This is unchanged to slightly worse than it was preoperatively. He has not had any recent COPD exacerbations. His appetite is good. He has not had any weight loss. He is not having any significant incisional pain.  He saw Dr. Julien Nordmann. He did not require any adjuvant therapy.  Past Medical History:  Diagnosis Date  . Arthritis   . Asthma   . Cancer ([place])    skin  . COPD (chronic obstructive pulmonary disease) ([place])   . GERD (gastroesophageal reflux disease)   . History of bronchitis   . Hypertension   . Mixed hyperlipidemia   . Nocturia   . Shortness of breath dyspnea    with exertion  . Sleep apnea    will sometimes wear a CPAP  . Spinal stenosis       Current Outpatient Prescriptions  Medication Sig Dispense Refill  . acetaminophen (TYLENOL) 500 MG tablet Take 500 mg by mouth every 6 (six) hours as needed.    Marland Kitchen albuterol (PROVENTIL HFA;VENTOLIN HFA) 108 (90 Base) MCG/ACT inhaler Inhale 2 puffs into the lungs every 6 (six) hours as needed for wheezing. 3 Inhaler 2  . amLODipine (NORVASC) 10 MG tablet Take 1 tablet (10 mg total) by mouth daily. 90 tablet 1  . Ascorbic Acid (VITAMIN C) 1000 MG tablet Take 1,000 mg by mouth daily.    Marland Kitchen aspirin 81 MG tablet Take 81 mg by mouth daily.    Marland Kitchen atorvastatin (LIPITOR) 40 MG tablet TAKE 1 TABLET DAILY (NEW DOSE) 90 tablet 0  . budesonide-formoterol (SYMBICORT) 160-4.5 MCG/ACT inhaler Inhale 2 puffs into the lungs 2 (two) times daily.    . Coenzyme Q10 (COQ10) 400 MG CAPS Take  1 capsule by mouth daily.     . Flaxseed, Linseed, (FLAXSEED OIL) 1000 MG CAPS Take 1 capsule by mouth daily.    . fluticasone (FLONASE) 50 MCG/ACT nasal spray USE 1 SPRAY IN EACH NOSTRIL DAILY AS NEEDED 16 g 4  . ibuprofen (ADVIL,MOTRIN) 200 MG tablet Take 800 mg by mouth 2 (two) times daily as needed.     Marland Kitchen ketotifen (ZADITOR) 0.025 % ophthalmic solution Place 1 drop into both eyes 2 (two) times daily as needed.    . lidocaine (XYLOCAINE) 5 % ointment Apply 1 application topically as needed for mild pain (For Back and hip).     . loratadine (CLARITIN) 10 MG tablet Take 10 mg by mouth daily as needed for allergies.    . Magnesium 250 MG TABS Take 1 tablet by mouth daily.    . mometasone (ELOCON) 0.1 % lotion APPLY TOPICALLY DAILY 60 mL 3  . omeprazole (PRILOSEC) 20 MG capsule TAKE 1 CAPSULE DAILY AS NEEDED 90 capsule 0  . SPIRIVA HANDIHALER 18 MCG inhalation capsule INHALE THE CONTENTS OF 1 CAPSULE DAILY 90 capsule 1  . tamsulosin (FLOMAX) 0.4 MG CAPS capsule Take 1 tablet by mouth  at bedtime. 90 capsule 1  . triamcinolone (KENALOG) 0.025 % cream Apply 1 application topically 2 (two) times daily. Reported on 05/14/2016    . Turmeric Curcumin 500 MG CAPS Take 1 capsule by mouth daily.     No current facility-administered medications for this visit.     Physical Exam BP 133/80 (BP Location: Left Arm, Patient Position: Sitting, Cuff Size: Normal)   Pulse 76   Resp 20   Ht '5\' 6"'$  (1.676 m)   Wt 216 lb (98 kg)   SpO2 97% Comment: RA  BMI 34.40 kg/m   70 year old man in no acute distress Alert and oriented 3 with no focal deficits Lungs clear equal breath sounds bilaterally No cervical or supraclavicular adenopathy Cardiac regular rate and rhythm normal S1 and S2 Incisions well healed  Diagnostic Tests: CHEST  2 VIEW  COMPARISON:  Chest x-ray of 04/28/2016  FINDINGS: Postoperative scarring and volume loss in the left mid lung and left lung base is stable. The right lung is clear.  Mediastinal and hilar contours are unremarkable. The heart is within normal limits in size. There are degenerative changes throughout the mid to lower thoracic spine.  IMPRESSION: Stable postsurgical changes on the left.  No active lung disease.   Electronically Signed   By: Ivar Drape M.D.   On: 06/30/2016 10:04 I personally reviewed the chest x-ray and concur with the findings as noted above.  Impression: Miguel Morales is a 70 year old man who had a lingular segmentectomy for stage IA adenocarcinoma in May of this year. He is now about 3 months postop. He is doing well. He does not have any significant pain related to his surgery.  He quit smoking in 2005.  He will see Dr. Julien Nordmann in January for 6 month follow-up visit with a CT scan.  Plan: I'll plan to see him back in 9 or 10 months for a 1 year follow-up visit.  Miguel Nakayama, MD [facility] [phone]

## 2016-07-01 ENCOUNTER — Other Ambulatory Visit: Payer: Self-pay | Admitting: Family Medicine

## 2016-07-02 ENCOUNTER — Ambulatory Visit: Payer: Medicare Other | Admitting: Internal Medicine

## 2016-07-03 ENCOUNTER — Ambulatory Visit: Payer: Medicare Other | Admitting: Internal Medicine

## 2016-07-07 ENCOUNTER — Encounter: Payer: Self-pay | Admitting: *Deleted

## 2016-07-07 ENCOUNTER — Telehealth: Payer: Self-pay | Admitting: *Deleted

## 2016-07-07 NOTE — Telephone Encounter (Signed)
Called Mr. Ulysse to see if he has a copy of his consent form from the [place] study that we can place in our files. He states he believes he has this at home, and will check when he arrives home and will call back. I advised him to call and ask for [facility] with research. Will await his call back.

## 2016-07-10 ENCOUNTER — Other Ambulatory Visit: Payer: Self-pay

## 2016-07-14 NOTE — Telephone Encounter (Signed)
LMTCB x1 to advise the patient that we no longer need a copy of his consent form for the [employer] Study. I left my direct number for him to call back.   [place], [place], Research Assistant

## 2016-07-16 NOTE — Telephone Encounter (Signed)
Patient advised that we do not need a copy of the consent form, the original has been placed in study file.  [place] Bing, CMA

## 2016-07-21 ENCOUNTER — Encounter: Payer: Self-pay | Admitting: Internal Medicine

## 2016-07-21 ENCOUNTER — Ambulatory Visit (INDEPENDENT_AMBULATORY_CARE_PROVIDER_SITE_OTHER): Payer: Medicare Other | Admitting: Internal Medicine

## 2016-07-21 VITALS — BP 130/84 | HR 82 | Ht 66.0 in | Wt 219.0 lb

## 2016-07-21 DIAGNOSIS — R0600 Dyspnea, unspecified: Secondary | ICD-10-CM

## 2016-07-21 DIAGNOSIS — Z23 Encounter for immunization: Secondary | ICD-10-CM | POA: Diagnosis not present

## 2016-07-21 DIAGNOSIS — J449 Chronic obstructive pulmonary disease, unspecified: Secondary | ICD-10-CM

## 2016-07-21 NOTE — Addendum Note (Signed)
Addended by: Collier Salina on: 07/21/2016 11:18 AM   Modules accepted: Orders

## 2016-07-21 NOTE — Progress Notes (Signed)
Subjective:     Patient ID: Miguel Morales, male   DOB: 15-Sep-1946, 70 y.o.   MRN: [id number]  PCP Sallee Lange, MD   HPI   OV 07/21/2016  Chief Complaint  Patient presents with  . Follow-up    Pt had lobectomy and states he is more SOB and isnt able to rebound as quickly. Pt c/o cough with intermittent mucus production with clear mucus. Pt states he still has some tenderness under left breast.     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 He underwent resection in summer 2017. Now he presents for follow-up. His incision has healed well. His only issue is that he is more short of breath than preoperatively. He has to stop after walking one fourth of a mile. He has not lost any weight. Infectious visceral obesity. He is on triple inhaler therapy. He has not had Pulmicort function testing after surgical resection of his left upper lobe. His cancer surveillance is through Dr. Julien Nordmann. He is not a candidate for any chemotherapy because of stage I lung cancer. He is appreciative of the fact that his cancer was caught and now he is cured potentially. He will have a flu shot today there are no other issues.     has a past medical history of Arthritis; Asthma; Cancer ([place]); COPD (chronic obstructive pulmonary disease) ([place]); GERD (gastroesophageal reflux disease); History of bronchitis; Hypertension; Mixed hyperlipidemia; Nocturia; Shortness of breath dyspnea; Sleep apnea; and Spinal stenosis.   reports that he quit smoking about 12 years ago. His smoking use included Cigarettes. He started smoking about 57 years ago. He has a 30.00 pack-year smoking history. He has never used smokeless tobacco.  Past Surgical History:  Procedure Laterality Date  . CIRCUMCISION    . COLONOSCOPY  03/04/2004   WUJ:[id number] diverticula.  The remainder of the colonic mucosa appeared normal/normal rectum  . COLONOSCOPY  05/22/2009   NFA:[id number] mucosa appeared  normal/Left-sided diverticula/Normal rectum  . COLONOSCOPY N/A 08/02/2014   Procedure: COLONOSCOPY;  Surgeon: Daneil Dolin, MD;  [facility];  Service: Endoscopy;  Laterality: N/A;  2:00 PM  . KNEE ARTHROSCOPY Right 2004  . LYMPH NODE DISSECTION N/A 04/08/2016   Procedure: LYMPH NODE DISSECTION;  Surgeon: Melrose Nakayama, MD;  Location: [facility];  Service: Thoracic;  Laterality: N/A;  . SEPTOPLASTY    . VIDEO ASSISTED THORACOSCOPY (VATS)/WEDGE RESECTION Left 04/08/2016   Procedure: VIDEO ASSISTED THORACOSCOPY (VATS)/WEDGE RESECTION;  Surgeon: Melrose Nakayama, MD;  Location: [facility];  Service: Thoracic;  Laterality: Left;  Marland Kitchen VIDEO BRONCHOSCOPY WITH ENDOBRONCHIAL NAVIGATION N/A 02/26/2016   Procedure: VIDEO BRONCHOSCOPY WITH ENDOBRONCHIAL NAVIGATION LEFT UPPER LOBE LUNG NODULE;  Surgeon: [name], MD;  Location: [facility];  Service: Thoracic;  Laterality: N/A;    Allergies  Allergen Reactions  . Adhesive [Tape] Other (See Comments)    blisters  . Sulfa Antibiotics Other (See Comments)    Immunization History  Administered Date(s) Administered  . Influenza-Unspecified 07/25/2013, 08/07/2014, 07/23/2015  . Pneumococcal Conjugate-13 05/09/2014  . Pneumococcal Polysaccharide-23 11/10/2011  . Td 11/09/2010  . Zoster 12/14/2012    Family History  Problem Relation Age of Onset  . Cancer Mother      Current Outpatient Prescriptions:  .  acetaminophen (TYLENOL) 500 MG tablet, Take 500 mg by mouth every 6 (six) hours as needed., Disp: , Rfl:  .  albuterol (PROVENTIL HFA;VENTOLIN HFA) 108 (90 Base) MCG/ACT inhaler, Inhale 2  puffs into the lungs every 6 (six) hours as needed for wheezing., Disp: 3 Inhaler, Rfl: 2 .  amLODipine (NORVASC) 10 MG tablet, TAKE 1 TABLET DAILY, Disp: 90 tablet, Rfl: 1 .  Ascorbic Acid (VITAMIN C) 1000 MG tablet, Take 1,000 mg by mouth daily., Disp: , Rfl:  .  aspirin 81 MG tablet, Take 81 mg by mouth daily., Disp: , Rfl:  .  atorvastatin  (LIPITOR) 40 MG tablet, TAKE 1 TABLET DAILY (NEW DOSE), Disp: 90 tablet, Rfl: 0 .  budesonide-formoterol (SYMBICORT) 160-4.5 MCG/ACT inhaler, Inhale 2 puffs into the lungs 2 (two) times daily., Disp: , Rfl:  .  Coenzyme Q10 (COQ10) 400 MG CAPS, Take 1 capsule by mouth daily. , Disp: , Rfl:  .  Flaxseed, Linseed, (FLAXSEED OIL) 1000 MG CAPS, Take 1 capsule by mouth daily., Disp: , Rfl:  .  fluticasone (FLONASE) 50 MCG/ACT nasal spray, USE 1 SPRAY IN EACH NOSTRIL DAILY AS NEEDED, Disp: 16 g, Rfl: 4 .  hydrOXYzine (ATARAX/VISTARIL) 10 MG tablet, Take 10 mg by mouth at bedtime as needed., Disp: , Rfl:  .  ibuprofen (ADVIL,MOTRIN) 200 MG tablet, Take 800 mg by mouth 2 (two) times daily as needed. , Disp: , Rfl:  .  ketotifen (ZADITOR) 0.025 % ophthalmic solution, Place 1 drop into both eyes 2 (two) times daily as needed., Disp: , Rfl:  .  lidocaine (XYLOCAINE) 5 % ointment, Apply 1 application topically as needed for mild pain (For Back and hip). , Disp: , Rfl:  .  loratadine (CLARITIN) 10 MG tablet, Take 10 mg by mouth daily as needed for allergies., Disp: , Rfl:  .  Magnesium 250 MG TABS, Take 1 tablet by mouth daily., Disp: , Rfl:  .  mometasone (ELOCON) 0.1 % lotion, APPLY TOPICALLY DAILY, Disp: 60 mL, Rfl: 3 .  omeprazole (PRILOSEC) 20 MG capsule, TAKE 1 CAPSULE DAILY AS NEEDED, Disp: 90 capsule, Rfl: 0 .  SPIRIVA HANDIHALER 18 MCG inhalation capsule, INHALE THE CONTENTS OF 1 CAPSULE DAILY, Disp: 90 capsule, Rfl: 1 .  tamsulosin (FLOMAX) 0.4 MG CAPS capsule, Take 1 tablet by mouth at bedtime., Disp: 90 capsule, Rfl: 1 .  triamcinolone (KENALOG) 0.025 % cream, Apply 1 application topically 2 (two) times daily. Reported on 05/14/2016, Disp: , Rfl:  .  Turmeric Curcumin 500 MG CAPS, Take 1 capsule by mouth daily., Disp: , Rfl:    Review of Systems     Objective:   Physical Exam  Constitutional: He is oriented to person, place, and time. He appears well-developed and well-nourished. No distress.   Body mass index is 35.35 kg/m.   HENT:  Head: Normocephalic and atraumatic.  Right Ear: External ear normal.  Left Ear: External ear normal.  Mouth/Throat: Oropharynx is clear and moist. No oropharyngeal exudate.  Eyes: Conjunctivae and EOM are normal. Pupils are equal, round, and reactive to light. Right eye exhibits no discharge. Left eye exhibits no discharge. No scleral icterus.  Neck: Normal range of motion. Neck supple. No JVD present. No tracheal deviation present. No thyromegaly present.  Cardiovascular: Normal rate, regular rhythm and intact distal pulses.  Exam reveals no gallop and no friction rub.   No murmur heard. Pulmonary/Chest: Effort normal and breath sounds normal. No respiratory distress. He has no wheezes. He has no rales. He exhibits no tenderness.  Abdominal: Soft. Bowel sounds are normal. He exhibits no distension and no mass. There is no tenderness. There is no rebound and no guarding.  Visceral  obesity present  Musculoskeletal: Normal range of motion. He exhibits no edema or tenderness.  Lymphadenopathy:    He has no cervical adenopathy.  Neurological: He is alert and oriented to person, place, and time. He has normal reflexes. No cranial nerve deficit. Coordination normal.  Skin: Skin is warm and dry. No rash noted. He is not diaphoretic. No erythema. No pallor.  Psychiatric: He has a normal mood and affect. His behavior is normal. Judgment and thought content normal.  Nursing note and vitals reviewed.   Vitals:   07/21/16 1051  BP: 130/84  Pulse: 82  SpO2: 97%  Weight: 219 lb (99.3 kg)  Height: '5\' 6"'$  (1.676 m)        Assessment:       ICD-9-CM ICD-10-CM   1. Moderate COPD (chronic obstructive pulmonary disease) ([place]) 496 J44.9   2. Dyspnea 786.09 R06.00   3. Need for prophylactic vaccination and inoculation against influenza V04.81 Z23        Plan:       COPD appears stable Worsening dyspnea after lung surgery could be due to the fact  you're less lung to work with  Plan -- Continue Spiriva and Symbicort as before - High dose flu shot today 07/21/2016 - Refer pulmonary rehabilitation -  Pre-bd and post-bd spiro and dlco only (. No lung volume) sometime in the next 3 months  Follow-up - Return after spirometry testing in 3 months   Dr. Brand Males, M.D., [facility].C.P Pulmonary and Critical Care Medicine Staff Physician [place] [facility] Pager: [phone], If no answer or between  15:00h - 7:00h: call [phone]  07/21/2016 11:09 AM

## 2016-07-21 NOTE — Patient Instructions (Addendum)
ICD-9-CM ICD-10-CM   1. Moderate COPD (chronic obstructive pulmonary disease) ([place]) 496 J44.9   2. Dyspnea 786.09 R06.00   3. Need for prophylactic vaccination and inoculation against influenza V04.81 Z23     COPD appears stable Worsening dyspnea after lung surgery could be due to the fact you're less lung to work with  Plan -- Continue Spiriva and Symbicort as before - High dose flu shot today 07/21/2016 - Refer pulmonary rehabilitation -  Pre-bd and post-bd spiro and dlco only (. No lung volume) sometime in the next 3 months  Follow-up - Return after spirometry testing in 3 months

## 2016-08-18 ENCOUNTER — Telehealth: Payer: Self-pay | Admitting: Internal Medicine

## 2016-08-18 DIAGNOSIS — R0602 Shortness of breath: Secondary | ICD-10-CM

## 2016-08-18 NOTE — Telephone Encounter (Signed)
Patient returning call -pr

## 2016-08-18 NOTE — Telephone Encounter (Signed)
LMTCB

## 2016-08-18 NOTE — Telephone Encounter (Signed)
Pt aware of aware that [facility] is waiting for additional information from MD prior to scheduling. Pt aware that I am sending phone message to MR to advise and give a call with updated information to [facility].   MR, [facility] needs additional information regarding dx for pulmonary rehab:  Referral Notes  Number of Notes: 2  Type Date User Summary Attachment  General 08/16/2016 12:37 PM Diane B Coad - -  Note   We will need to reach out to Pulmonary doctor for a recent PFT. The current results do not meet the recommended Gold standard for COPD. We will schedule when we can satisfy the criteria.  Diane Angelina Pih         Type Date User Summary Attachment  General 07/27/2016 7:52 AM Feliz Beam Divincenzo, RCEP - -  Note   Pulmonary Rehab referral printed and ready to process

## 2016-08-19 ENCOUNTER — Telehealth: Payer: Self-pay | Admitting: Internal Medicine

## 2016-08-19 NOTE — Telephone Encounter (Signed)
Odd that PFT from feb 2016 will not meet Gold stage 2 copd criteria for Pulm rehab. Why is that? Can you ask Diane Coad at pulm rehab at [facility] please. Please also ask her if they will accept Emphysema or lung cancer? If none for all of above, then please redo PFT @ [facility] patient address is [place] [address]   Thanks  .Dr. Brand Males, M.D., [facility].C.P Pulmonary and Critical Care Medicine Staff Physician [place] [facility] Pager: [phone], If no answer or between  15:00h - 7:00h: call [phone]  08/19/2016 8:57 AM

## 2016-08-19 NOTE — Telephone Encounter (Signed)
Spoke with Diane  She states that pt is a GOLD stage 1, and medicare does not accept this  We can use lung CA or SOB  I have sent a new referral  Nothing further needed

## 2016-08-19 NOTE — Telephone Encounter (Signed)
Will forward to MR to sign Pulm Rehab order

## 2016-08-20 NOTE — Telephone Encounter (Signed)
Called spoke with Diane. She states that the order has been received and pt has been scheduled. Nothing further needed.

## 2016-08-20 NOTE — Telephone Encounter (Signed)
Signed.   Dr. Brand Males, M.D., [facility].C.P Pulmonary and Critical Care Medicine Staff Physician [place] [facility] Pager: [phone], If no answer or between  15:00h - 7:00h: call [phone]  08/20/2016 4:27 AM

## 2016-08-31 ENCOUNTER — Other Ambulatory Visit: Payer: Self-pay | Admitting: Family Medicine

## 2016-09-01 ENCOUNTER — Encounter (HOSPITAL_COMMUNITY)
Admission: RE | Admit: 2016-09-01 | Discharge: 2016-09-01 | Disposition: A | Discharge status: Home / Self Care | Payer: Medicare Other | Source: Ambulatory Visit | Attending: Internal Medicine | Admitting: Internal Medicine

## 2016-09-01 ENCOUNTER — Encounter (HOSPITAL_COMMUNITY): Payer: Self-pay

## 2016-09-01 VITALS — BP 120/62 | HR 85 | Ht 66.0 in | Wt 212.5 lb

## 2016-09-01 DIAGNOSIS — R0602 Shortness of breath: Secondary | ICD-10-CM | POA: Insufficient documentation

## 2016-09-01 NOTE — Progress Notes (Signed)
Pulmonary Individual Treatment Plan  Patient Details  Name: Miguel Morales MRN: [id number] Date of Birth: [birth year] Referring Provider:   [place] from 09/01/2016 in [place]  Referring Provider  Dr. [place] Caller      Initial Encounter Date:  Flowsheet Row PULMONARY REHAB OTHER RESP ORIENTATION from 09/01/2016 in [place]  Date  09/01/16  Referring Provider  Dr. [place] Caller      Visit Diagnosis: SOB (shortness of breath)  Patient's Home Medications on Admission:   Current Outpatient Prescriptions:  .  acetaminophen (TYLENOL) 500 MG tablet, Take 1,000 mg by mouth every 6 (six) hours as needed. , Disp: , Rfl:  .  albuterol (PROVENTIL HFA;VENTOLIN HFA) 108 (90 Base) MCG/ACT inhaler, Inhale 2 puffs into the lungs every 6 (six) hours as needed for wheezing., Disp: 3 Inhaler, Rfl: 2 .  amLODipine (NORVASC) 10 MG tablet, TAKE 1 TABLET DAILY, Disp: 90 tablet, Rfl: 1 .  Ascorbic Acid (VITAMIN C) 1000 MG tablet, Take 1,000 mg by mouth daily., Disp: , Rfl:  .  aspirin 81 MG tablet, Take 81 mg by mouth daily., Disp: , Rfl:  .  atorvastatin (LIPITOR) 40 MG tablet, TAKE 1 TABLET DAILY (NEW DOSE), Disp: 90 tablet, Rfl: 0 .  budesonide-formoterol (SYMBICORT) 160-4.5 MCG/ACT inhaler, Inhale 2 puffs into the lungs 2 (two) times daily., Disp: , Rfl:  .  Coenzyme Q10 (COQ10) 400 MG CAPS, Take 1 capsule by mouth daily. , Disp: , Rfl:  .  Flaxseed, Linseed, (FLAXSEED OIL) 1000 MG CAPS, Take 1 capsule by mouth daily., Disp: , Rfl:  .  fluticasone (FLONASE) 50 MCG/ACT nasal spray, USE 1 SPRAY IN EACH NOSTRIL DAILY AS NEEDED, Disp: 16 g, Rfl: 4 .  hydrOXYzine (ATARAX/VISTARIL) 10 MG tablet, Take 10 mg by mouth at bedtime as needed., Disp: , Rfl:  .  ibuprofen (ADVIL,MOTRIN) 200 MG tablet, Take 800 mg by mouth 2 (two) times daily as needed. , Disp: , Rfl:  .  ketotifen (ZADITOR) 0.025 % ophthalmic solution, Place 1 drop  into both eyes 2 (two) times daily as needed., Disp: , Rfl:  .  lidocaine (XYLOCAINE) 5 % ointment, Apply 1 application topically as needed for mild pain (For Back and hip). , Disp: , Rfl:  .  loratadine (CLARITIN) 10 MG tablet, Take 10 mg by mouth daily as needed for allergies., Disp: , Rfl:  .  Magnesium 250 MG TABS, Take 1 tablet by mouth daily., Disp: , Rfl:  .  mometasone (ELOCON) 0.1 % lotion, APPLY TOPICALLY DAILY, Disp: 60 mL, Rfl: 3 .  omeprazole (PRILOSEC) 20 MG capsule, TAKE 1 CAPSULE DAILY AS NEEDED, Disp: 90 capsule, Rfl: 0 .  SPIRIVA HANDIHALER 18 MCG inhalation capsule, INHALE THE CONTENTS OF 1 CAPSULE DAILY, Disp: 90 capsule, Rfl: 1 .  tamsulosin (FLOMAX) 0.4 MG CAPS capsule, Take 1 tablet by mouth at bedtime. (Patient not taking: Reported on 09/01/2016), Disp: 90 capsule, Rfl: 1 .  triamcinolone (KENALOG) 0.025 % cream, Apply 1 application topically 2 (two) times daily. Reported on 05/14/2016, Disp: , Rfl:  .  Turmeric Curcumin 500 MG CAPS, Take 1 capsule by mouth daily., Disp: , Rfl:   Past Medical History: Past Medical History:  Diagnosis Date  . Arthritis   . Asthma   . Cancer ([place])    skin  . COPD (chronic obstructive pulmonary disease) ([place])   . GERD (gastroesophageal reflux disease)   . History of bronchitis   . Hypertension   .  Mixed hyperlipidemia   . Nocturia   . Shortness of breath dyspnea    with exertion  . Sleep apnea    will sometimes wear a CPAP  . Spinal stenosis     Tobacco Use: History  Smoking Status  . Former Smoker  . Packs/day: 1.00  . Years: 30.00  . Types: Cigarettes  . Start date: 11/09/1958  . Quit date: 11/10/2003  Smokeless Tobacco  . Never Used    Labs: Recent Review Flowsheet Data    Labs for ITP Cardiac and Pulmonary Rehab Latest Ref Rng & Units 12/23/2015 04/03/2016 04/09/2016 06/16/2016 06/19/2016   Cholestrol 100 - 199 mg/dL - - - 166 -   LDLCALC 0 - 99 mg/dL - - - 101(H) -   HDL >39 mg/dL - - - 37(L) -   Trlycerides 0 - 149  mg/dL - - - 138 -   Hemoglobin A1c - 5.8 - - - 5.7   PHART 7.350 - 7.450 - 7.464(H) 7.428 - -   PCO2ART 35.0 - 45.0 mmHg - 34.8(L) 35.2 - -   HCO3 20.0 - 24.0 mEq/L - 24.7(H) 23.0 - -   TCO2 0 - 100 mmol/L - 25.7 24.1 - -   ACIDBASEDEF 0.0 - 2.0 mmol/L - - 0.9 - -   O2SAT % - 96.3 92.5 - -      Capillary Blood Glucose: Lab Results  Component Value Date   GLUCAP 137 (H) 04/10/2016   GLUCAP 231 (H) 04/09/2016   GLUCAP 150 (H) 04/09/2016   GLUCAP 168 (H) 04/09/2016   GLUCAP 113 (H) 04/09/2016     ADL UCSD:     Pulmonary Assessment Scores    Row Name 09/01/16 1108         ADL UCSD   ADL Phase Entry     SOB Score total 68     Rest 0     Walk 6     Stairs 4     Bath 3     Dress 3     Shop 3       CAT Score   CAT Score 21       mMRC Score   mMRC Score 3        Pulmonary Function Assessment:     Pulmonary Function Assessment - 09/01/16 1105      Pulmonary Function Tests   FVC% 2.93 %   FEV1% 1.96 %   FEV1/FVC Ratio 67   RV% 2.89 %   DLCO% 72 %     Initial Spirometry Results   FVC% 2.93 %   FEV1% 1.96 %   FEV1/FVC Ratio 67     Post Bronchodilator Spirometry Results   FVC% 2.9 %   FEV1% 2.04 %   FEV1/FVC Ratio 70     Breath   Shortness of Breath Limiting activity      Exercise Target Goals: Date: 09/01/16  Exercise Program Goal: Individual exercise prescription set with THRR, safety & activity barriers. Participant demonstrates ability to understand and report RPE using BORG scale, to self-measure pulse accurately, and to acknowledge the importance of the exercise prescription.  Exercise Prescription Goal: Starting with aerobic activity 30 plus minutes a day, 3 days per week for initial exercise prescription. Provide home exercise prescription and guidelines that participant acknowledges understanding prior to discharge.  Activity Barriers & Risk Stratification:     Activity Barriers & Cardiac Risk Stratification - 09/01/16 1102       Activity Barriers & Cardiac  Risk Stratification   Activity Barriers Joint Problems  (L) hip joint   Cardiac Risk Stratification Low      6 Minute Walk:     6 Minute Walk    Row Name 09/01/16 1044         6 Minute Walk   Phase Initial     Distance 1400 feet     Walk Time 6 minutes     # of Rest Breaks 0     MPH 2.65     METS 3.03     RPE 12     Perceived Dyspnea  11     VO2 Peak 8.89     Symptoms No     Resting HR 85 bpm     Resting BP 120/62     Max Ex. HR 94 bpm     Max Ex. BP 136/70     2 Minute Post BP 118/62        Initial Exercise Prescription:     Initial Exercise Prescription - 09/01/16 1000      Date of Initial Exercise RX and Referring Provider   Date 09/01/16   Referring Provider Dr. [place] Caller     Treadmill   MPH 1.3   Grade 0   Minutes 15   METs 1.9     NuStep   Level 2   Watts 15   Minutes 20   METs 1.9     Prescription Details   Frequency (times per week) 3   Duration Progress to 30 minutes of continuous aerobic without signs/symptoms of physical distress     Intensity   THRR REST +  20   THRR 40-80% of Max Heartrate 110-123-135   Ratings of Perceived Exertion 11-13   Perceived Dyspnea 0-4     Progression   Progression Continue progressive overload as per policy without signs/symptoms or physical distress.     Resistance Training   Training Prescription Yes   Weight 1   Reps 10-12      Perform Capillary Blood Glucose checks as needed.  Exercise Prescription Changes:   Exercise Comments:   Discharge Exercise Prescription (Final Exercise Prescription Changes):   Nutrition:  Target Goals: Understanding of nutrition guidelines, daily intake of sodium '1500mg'$ , cholesterol '200mg'$ , calories 30% from fat and 7% or less from saturated fats, daily to have 5 or more servings of fruits and vegetables.  Biometrics:     Pre Biometrics - 09/01/16 1047      Pre Biometrics   Height '5\' 6"'$  (1.676 m)   Weight 212 lb 8.4 oz (96.4  kg)   Waist Circumference 45 inches   Hip Circumference 42 inches   Waist to Hip Ratio 1.07 %   BMI (Calculated) 34.4   Triceps Skinfold 16 mm   % Body Fat 33.1 %   Grip Strength 36.67 kg   Flexibility 11.67 in   Single Leg Stand 3 seconds       Nutrition Therapy Plan and Nutrition Goals:   Nutrition Discharge: Rate Your Plate Scores:     Nutrition Assessments - 09/01/16 1116      Rate Your Plate Scores   Pre Score 47      Psychosocial: Target Goals: Acknowledge presence or absence of depression, maximize coping skills, provide positive support system. Participant is able to verbalize types and ability to use techniques and skills needed for reducing stress and depression.  Initial Review & Psychosocial Screening:     Initial Psych Review & Screening -  09/01/16 1120      Initial Review   Current issues with --  No real concerns     Family Dynamics   Good Support System? Yes     Barriers   Psychosocial barriers to participate in program There are no identifiable barriers or psychosocial needs.  Patient QOL scores are 24.98.      Screening Interventions   Interventions Encouraged to exercise      Quality of Life Scores:     Quality of Life - 09/01/16 1048      Quality of Life Scores   Health/Function Pre 22.68 %   Socioeconomic Pre 27.92 %   Psych/Spiritual Pre 26.07 %   Family Pre 26.4 %   GLOBAL Pre 24.98 %      PHQ-9: Recent Review Flowsheet Data    Depression screen [facility] 2/9 09/01/2016 08/16/2014   Decreased Interest 0 0   Down, Depressed, Hopeless 0 0   PHQ - 2 Score 0 0   Altered sleeping 2 -   Tired, decreased energy 2 -   Change in appetite 1 -   Feeling bad or failure about yourself  0 -   Trouble concentrating 0 -   Moving slowly or fidgety/restless 0 -   Suicidal thoughts 0 -   PHQ-9 Score 5 -   Difficult doing work/chores Somewhat difficult -      Psychosocial Evaluation and Intervention:     Psychosocial Evaluation -  09/01/16 1121      Psychosocial Evaluation & Interventions   Interventions Encouraged to exercise with the program and follow exercise prescription   Comments Counseling not needed. QOL scores are WNL 24.98   Continued Psychosocial Services Needed No      Psychosocial Re-Evaluation:   Education: Education Goals: Education classes will be provided on a weekly basis, covering required topics. Participant will state understanding/return demonstration of topics presented.  Learning Barriers/Preferences:     Learning Barriers/Preferences - 09/01/16 1104      Learning Barriers/Preferences   Learning Barriers None   Learning Preferences Written Material;Pictoral;Video      Education Topics: How Lungs Work and Diseases: - Discuss the anatomy of the lungs and diseases that can affect the lungs, such as COPD.   Exercise: -Discuss the importance of exercise, FITT principles of exercise, normal and abnormal responses to exercise, and how to exercise safely.   Environmental Irritants: -Discuss types of environmental irritants and how to limit exposure to environmental irritants.   Meds/Inhalers and oxygen: - Discuss respiratory medications, definition of an inhaler and oxygen, and the proper way to use an inhaler and oxygen.   Energy Saving Techniques: - Discuss methods to conserve energy and decrease shortness of breath when performing activities of daily living.    Bronchial Hygiene / Breathing Techniques: - Discuss breathing mechanics, pursed-lip breathing technique,  proper posture, effective ways to clear airways, and other functional breathing techniques   Cleaning Equipment: - Provides group verbal and written instruction about the health risks of elevated stress, cause of high stress, and healthy ways to reduce stress.   Nutrition I: Fats: - Discuss the types of cholesterol, what cholesterol does to the body, and how cholesterol levels can be  controlled.   Nutrition II: Labels: -Discuss the different components of food labels and how to read food labels.   Respiratory Infections: - Discuss the signs and symptoms of respiratory infections, ways to prevent respiratory infections, and the importance of seeking medical treatment when having a respiratory infection.  Stress I: Signs and Symptoms: - Discuss the causes of stress, how stress may lead to anxiety and depression, and ways to limit stress.   Stress II: Relaxation: -Discuss relaxation techniques to limit stress.   Oxygen for Home/Travel: - Discuss how to prepare for travel when on oxygen and proper ways to transport and store oxygen to ensure safety.   Knowledge Questionnaire Score:     Knowledge Questionnaire Score - 09/01/16 1104      Knowledge Questionnaire Score   Pre Score 9/14      Core Components/Risk Factors/Patient Goals at Admission:     Personal Goals and Risk Factors at Admission - 09/01/16 1117      Core Components/Risk Factors/Patient Goals on Admission    Weight Management Weight Loss   Increase Strength and Stamina Yes   Intervention Provide advice, education, support and counseling about physical activity/exercise needs.;Develop an individualized exercise prescription for aerobic and resistive training based on initial evaluation findings, risk stratification, comorbidities and participant's personal goals.   Expected Outcomes Achievement of increased cardiorespiratory fitness and enhanced flexibility, muscular endurance and strength shown through measurements of functional capacity and personal statement of participant.   Improve shortness of breath with ADL's Yes   Intervention Provide education, individualized exercise plan and daily activity instruction to help decrease symptoms of SOB with activities of daily living.   Expected Outcomes Short Term: Achieves a reduction of symptoms when performing activities of daily living.    Personal Goal Other Yes   Personal Goal Improve breathing, maintain good health   Intervention Attend PR 2 x week and supplement his exercise at home with 3 x week activity.    Expected Outcomes Reach personal goal.       Core Components/Risk Factors/Patient Goals Review:      Goals and Risk Factor Review    Row Name 09/01/16 1119             Core Components/Risk Factors/Patient Goals Review   Personal Goals Review Weight Management/Obesity;Increase Strength and Stamina;Improve shortness of breath with ADL's          Core Components/Risk Factors/Patient Goals at Discharge (Final Review):      Goals and Risk Factor Review - 09/01/16 1119      Core Components/Risk Factors/Patient Goals Review   Personal Goals Review Weight Management/Obesity;Increase Strength and Stamina;Improve shortness of breath with ADL's      ITP Comments:   Comments: Patient arrived for 1st visit/orientation/education at 0800. Patient was referred to CR by Dr. [place] Caller due to SOB R06.02. During orientation advised patient on arrival and appointment times what to wear, what to do before, during and after exercise. Reviewed attendance and class policy. Talked about inclement weather and class consultation policy. Pt is scheduled to return Pulmonary Rehab on 09/03/16 at 1330. Pt was advised to come to class 15 minutes before class starts. Patient was also given instructions on meeting with the dietician and attending the Family Structure classes. Pt is eager to get started. Patient participated in warm-up stretches including weights and resistance training. Patient was able to complete 6 minute walk test. Patient was measured for the equipment. Discussed equipment safety with patient. Took patient pre-anthropometric measurements. Patient finished visit at 1045.

## 2016-09-01 NOTE — Progress Notes (Signed)
Cardiac/Pulmonary Rehab Medication Review by a Pharmacist  Does the patient  feel that his/her medications are working for him/her?  yes  Has the patient been experiencing any side effects to the medications prescribed?  no  Does the patient measure his/her own blood pressure or blood glucose at home?  yes   Does the patient have any problems obtaining medications due to transportation or finances?   no  Understanding of regimen: good Understanding of indications: good Potential of compliance: good   Pharmacist comments: Miguel Morales states he no linger takes flomax.  He dose not think it work for him.  His wife is a Marine scientist and she takes his BP which has been fine.    Miguel Morales 09/01/2016 10:06 AM

## 2016-09-03 ENCOUNTER — Encounter (HOSPITAL_COMMUNITY)
Admission: RE | Admit: 2016-09-03 | Discharge: 2016-09-03 | Disposition: A | Discharge status: Home / Self Care | Payer: Medicare Other | Source: Ambulatory Visit | Attending: Internal Medicine | Admitting: Internal Medicine

## 2016-09-03 DIAGNOSIS — R0602 Shortness of breath: Secondary | ICD-10-CM | POA: Diagnosis not present

## 2016-09-03 NOTE — Progress Notes (Signed)
Daily Session Note  Patient Details  Name: Miguel Morales MRN: [id number] Date of Birth: 1946-02-01 Referring Provider:   [place] from 09/01/2016 in [place]  Referring Provider  Dr. [place] Caller      Encounter Date: 09/03/2016  Check In:     Session Check In - 09/03/16 1330      Check-In   Location [facility]   Staff Present Aundra Dubin, RN, BSN;Antolin Belsito Luther Parody, BS, EP, Exercise Physiologist   Supervising physician immediately available to respond to emergencies See telemetry face sheet for immediately available MD   Medication changes reported     No   Fall or balance concerns reported    No   Warm-up and Cool-down Performed as group-led instruction   Resistance Training Performed Yes   VAD Patient? No     Pain Assessment   Currently in Pain? No/denies   Pain Score 0-No pain   Multiple Pain Sites No      Capillary Blood Glucose: No results found for this or any previous visit (from the past 24 hour(s)).   Goals Met:  Independence with exercise equipment Exercise tolerated well No report of cardiac concerns or symptoms Strength training completed today  Goals Unmet:  Not Applicable  Comments: Check out 230   Dr. Kate Sable is Medical Director for [facility] and Pulmonary Rehab.

## 2016-09-08 ENCOUNTER — Encounter (HOSPITAL_COMMUNITY)
Admission: RE | Admit: 2016-09-08 | Discharge: 2016-09-08 | Disposition: A | Discharge status: Home / Self Care | Payer: Medicare Other | Source: Ambulatory Visit | Attending: Internal Medicine | Admitting: Internal Medicine

## 2016-09-08 DIAGNOSIS — R0602 Shortness of breath: Secondary | ICD-10-CM | POA: Diagnosis not present

## 2016-09-08 NOTE — Progress Notes (Signed)
Daily Session Note  Patient Details  Name: Miguel Morales MRN: [id number] Date of Birth: 10/27/1946 Referring Provider:   [place] from 09/01/2016 in [place]  Referring Provider  Dr. [place] Caller      Encounter Date: 09/08/2016  Check In:     Session Check In - 09/08/16 1330      Check-In   Location [facility]   Staff Present Suzanne Boron, BS, EP, Exercise Physiologist   Supervising physician immediately available to respond to emergencies See telemetry face sheet for immediately available MD   Medication changes reported     No   Fall or balance concerns reported    No   Warm-up and Cool-down Performed as group-led instruction   Resistance Training Performed Yes   VAD Patient? No     Pain Assessment   Currently in Pain? No/denies   Pain Score 0-No pain   Multiple Pain Sites No      Capillary Blood Glucose: No results found for this or any previous visit (from the past 24 hour(s)).   Goals Met:  Independence with exercise equipment Exercise tolerated well No report of cardiac concerns or symptoms Strength training completed today  Goals Unmet:  Not Applicable  Comments: Check out 230   Dr. Kate Sable is Medical Director for [place] and Pulmonary Rehab.

## 2016-09-10 ENCOUNTER — Encounter (HOSPITAL_COMMUNITY)
Admission: RE | Admit: 2016-09-10 | Discharge: 2016-09-10 | Disposition: A | Discharge status: Home / Self Care | Payer: Medicare Other | Source: Ambulatory Visit | Attending: Internal Medicine | Admitting: Internal Medicine

## 2016-09-10 DIAGNOSIS — R0602 Shortness of breath: Secondary | ICD-10-CM | POA: Diagnosis not present

## 2016-09-10 NOTE — Progress Notes (Signed)
Daily Session Note  Patient Details  Name: JABRI BLANCETT MRN: [id number] Date of Birth: 1946/02/28 Referring Provider:   [place] from 09/01/2016 in [place]  Referring Provider  Dr. [place] Caller      Encounter Date: 09/10/2016  Check In:     Session Check In - 09/10/16 1330      Check-In   Location [facility]   Staff Present Suzanne Boron, BS, EP, Exercise Physiologist   Supervising physician immediately available to respond to emergencies See telemetry face sheet for immediately available MD   Medication changes reported     No   Fall or balance concerns reported    No   Warm-up and Cool-down Performed as group-led instruction   Resistance Training Performed Yes   VAD Patient? No     Pain Assessment   Currently in Pain? No/denies   Pain Score 0-No pain   Multiple Pain Sites No      Capillary Blood Glucose: No results found for this or any previous visit (from the past 24 hour(s)).   Goals Met:  Independence with exercise equipment Exercise tolerated well No report of cardiac concerns or symptoms Strength training completed today  Goals Unmet:  Not Applicable  Comments: Check out 230   Dr. Sinda Du is Medical Director for [facility] Pulmonary Rehab.

## 2016-09-15 ENCOUNTER — Encounter (HOSPITAL_COMMUNITY)
Admission: RE | Admit: 2016-09-15 | Discharge: 2016-09-15 | Disposition: A | Discharge status: Home / Self Care | Payer: Medicare Other | Source: Ambulatory Visit | Attending: Internal Medicine | Admitting: Internal Medicine

## 2016-09-15 DIAGNOSIS — R0602 Shortness of breath: Secondary | ICD-10-CM

## 2016-09-15 NOTE — Progress Notes (Signed)
Daily Session Note  Patient Details  Name: Miguel Morales MRN: [id number] Date of Birth: October 31, 1946 Referring Provider:   Flowsheet Row PULMONARY REHAB OTHER RESP ORIENTATION from 09/01/2016 in [place]  Referring Provider  Dr. [place] Caller      Encounter Date: 09/15/2016  Check In:     Session Check In - 09/15/16 1300      Check-In   Location [facility]   Staff Present Yolani Vo Angelina Pih, MS, EP, [facility], Exercise Physiologist;Gregory Luther Parody, BS, EP, Exercise Physiologist   Supervising physician immediately available to respond to emergencies See telemetry face sheet for immediately available MD   Medication changes reported     No   Fall or balance concerns reported    No   Warm-up and Cool-down Performed as group-led instruction   Resistance Training Performed Yes   VAD Patient? No     Pain Assessment   Currently in Pain? No/denies   Pain Score 0-No pain   Multiple Pain Sites No      Capillary Blood Glucose: No results found for this or any previous visit (from the past 24 hour(s)).      Exercise Prescription Changes - 09/15/16 1200      Response to Exercise   Blood Pressure (Admit) 142/68   Blood Pressure (Exercise) 150/70   Blood Pressure (Exit) 114/60   Heart Rate (Admit) 91 bpm   Heart Rate (Exercise) 95 bpm   Heart Rate (Exit) 86 bpm   Oxygen Saturation (Admit) 96 %   Oxygen Saturation (Exercise) 96 %   Oxygen Saturation (Exit) 96 %   Rating of Perceived Exertion (Exercise) 9   Perceived Dyspnea (Exercise) 10   Duration Progress to 30 minutes of continuous aerobic without signs/symptoms of physical distress   Intensity Rest + 20     Progression   Progression Continue progressive overload as per policy without signs/symptoms or physical distress.     Resistance Training   Training Prescription Yes   Weight 2   Reps 10-12     Treadmill   MPH 1.5   Grade 0   Minutes 15   METs 2.1     NuStep   Level 3   Watts 13   Minutes 20   METs 3.65     Home Exercise Plan   Plans to continue exercise at Home   Frequency Add 2 additional days to program exercise sessions.     Goals Met:  Independence with exercise equipment Improved SOB with ADL's Using PLB without cueing & demonstrates good technique Exercise tolerated well Strength training completed today  Goals Unmet:  Not Applicable  Comments: Check out: 2:30   Dr. Sinda Du is Medical Director for [facility] Pulmonary Rehab.

## 2016-09-17 ENCOUNTER — Encounter (HOSPITAL_COMMUNITY)
Admission: RE | Admit: 2016-09-17 | Discharge: 2016-09-17 | Disposition: A | Discharge status: Home / Self Care | Payer: Medicare Other | Source: Ambulatory Visit | Attending: Internal Medicine | Admitting: Internal Medicine

## 2016-09-17 DIAGNOSIS — R0602 Shortness of breath: Secondary | ICD-10-CM

## 2016-09-17 NOTE — Progress Notes (Signed)
Daily Session Note  Patient Details  Name: ZAKARIE STURDIVANT MRN: [id number] Date of Birth: 15-Apr-1946 Referring Provider:   [place] from 09/01/2016 in [place]  Referring Provider  Dr. Chase Caller      Encounter Date: 09/17/2016  Check In:     Session Check In - 09/17/16 1330      Check-In   Location [facility]   Staff Present Suzanne Boron, BS, EP, Exercise Physiologist   Supervising physician immediately available to respond to emergencies See telemetry face sheet for immediately available MD   Medication changes reported     No   Fall or balance concerns reported    No   Warm-up and Cool-down Performed as group-led instruction   Resistance Training Performed Yes   VAD Patient? No     Pain Assessment   Currently in Pain? No/denies   Pain Score 0-No pain   Multiple Pain Sites No      Capillary Blood Glucose: No results found for this or any previous visit (from the past 24 hour(s)).   Goals Met:  Independence with exercise equipment Exercise tolerated well No report of cardiac concerns or symptoms Strength training completed today  Goals Unmet:  Not Applicable  Comments: Check out 230   Dr. Kate Sable is Medical Director for [place] and Pulmonary Rehab.

## 2016-09-20 ENCOUNTER — Other Ambulatory Visit: Payer: Self-pay | Admitting: Family Medicine

## 2016-09-22 ENCOUNTER — Encounter (HOSPITAL_COMMUNITY): Admission: RE | Admit: 2016-09-22 | Payer: Medicare Other | Source: Ambulatory Visit

## 2016-09-22 DIAGNOSIS — M4726 Other spondylosis with radiculopathy, lumbar region: Secondary | ICD-10-CM | POA: Diagnosis not present

## 2016-09-22 DIAGNOSIS — M48061 Spinal stenosis, lumbar region without neurogenic claudication: Secondary | ICD-10-CM | POA: Diagnosis not present

## 2016-09-22 DIAGNOSIS — M5136 Other intervertebral disc degeneration, lumbar region: Secondary | ICD-10-CM | POA: Diagnosis not present

## 2016-09-22 DIAGNOSIS — M5416 Radiculopathy, lumbar region: Secondary | ICD-10-CM | POA: Diagnosis not present

## 2016-09-23 NOTE — Progress Notes (Signed)
Pulmonary Individual Treatment Plan  Patient Details  Name: Miguel Morales MRN: [id number] Date of Birth: 01/28/1946 Referring Provider:   [place] from 09/01/2016 in [place]  Referring Provider  Dr. Chase Caller      Initial Encounter Date:  Flowsheet Row PULMONARY REHAB OTHER RESP ORIENTATION from 09/01/2016 in [place]  Date  09/01/16  Referring Provider  Dr. Chase Caller      Visit Diagnosis: SOB (shortness of breath)  Patient's Home Medications on Admission:   Current Outpatient Prescriptions:  .  acetaminophen (TYLENOL) 500 MG tablet, Take 1,000 mg by mouth every 6 (six) hours as needed. , Disp: , Rfl:  .  albuterol (PROVENTIL HFA;VENTOLIN HFA) 108 (90 Base) MCG/ACT inhaler, Inhale 2 puffs into the lungs every 6 (six) hours as needed for wheezing., Disp: 3 Inhaler, Rfl: 2 .  amLODipine (NORVASC) 10 MG tablet, TAKE 1 TABLET DAILY, Disp: 90 tablet, Rfl: 1 .  Ascorbic Acid (VITAMIN C) 1000 MG tablet, Take 1,000 mg by mouth daily., Disp: , Rfl:  .  aspirin 81 MG tablet, Take 81 mg by mouth daily., Disp: , Rfl:  .  atorvastatin (LIPITOR) 40 MG tablet, TAKE 1 TABLET DAILY (NEW DOSE), Disp: 90 tablet, Rfl: 0 .  budesonide-formoterol (SYMBICORT) 160-4.5 MCG/ACT inhaler, Inhale 2 puffs into the lungs 2 (two) times daily., Disp: , Rfl:  .  Coenzyme Q10 (COQ10) 400 MG CAPS, Take 1 capsule by mouth daily. , Disp: , Rfl:  .  Flaxseed, Linseed, (FLAXSEED OIL) 1000 MG CAPS, Take 1 capsule by mouth daily., Disp: , Rfl:  .  fluticasone (FLONASE) 50 MCG/ACT nasal spray, USE 1 SPRAY IN EACH NOSTRIL DAILY AS NEEDED, Disp: 16 g, Rfl: 4 .  hydrOXYzine (ATARAX/VISTARIL) 10 MG tablet, Take 10 mg by mouth at bedtime as needed., Disp: , Rfl:  .  ibuprofen (ADVIL,MOTRIN) 200 MG tablet, Take 800 mg by mouth 2 (two) times daily as needed. , Disp: , Rfl:  .  ketotifen (ZADITOR) 0.025 % ophthalmic solution, Place 1 drop  into both eyes 2 (two) times daily as needed., Disp: , Rfl:  .  lidocaine (XYLOCAINE) 5 % ointment, Apply 1 application topically as needed for mild pain (For Back and hip). , Disp: , Rfl:  .  loratadine (CLARITIN) 10 MG tablet, Take 10 mg by mouth daily as needed for allergies., Disp: , Rfl:  .  Magnesium 250 MG TABS, Take 1 tablet by mouth daily., Disp: , Rfl:  .  mometasone (ELOCON) 0.1 % lotion, APPLY TOPICALLY DAILY, Disp: 60 mL, Rfl: 3 .  omeprazole (PRILOSEC) 20 MG capsule, TAKE 1 CAPSULE DAILY AS NEEDED, Disp: 90 capsule, Rfl: 1 .  SPIRIVA HANDIHALER 18 MCG inhalation capsule, INHALE THE CONTENTS OF 1 CAPSULE DAILY, Disp: 90 capsule, Rfl: 1 .  tamsulosin (FLOMAX) 0.4 MG CAPS capsule, Take 1 tablet by mouth at bedtime. (Patient not taking: Reported on 09/01/2016), Disp: 90 capsule, Rfl: 1 .  triamcinolone (KENALOG) 0.025 % cream, Apply 1 application topically 2 (two) times daily. Reported on 05/14/2016, Disp: , Rfl:  .  Turmeric Curcumin 500 MG CAPS, Take 1 capsule by mouth daily., Disp: , Rfl:   Past Medical History: Past Medical History:  Diagnosis Date  . Arthritis   . Asthma   . Cancer ([place])    skin  . COPD (chronic obstructive pulmonary disease) ([place])   . GERD (gastroesophageal reflux disease)   . History of bronchitis   . Hypertension   .  Mixed hyperlipidemia   . Nocturia   . Shortness of breath dyspnea    with exertion  . Sleep apnea    will sometimes wear a CPAP  . Spinal stenosis     Tobacco Use: History  Smoking Status  . Former Smoker  . Packs/day: 1.00  . Years: 30.00  . Types: Cigarettes  . Start date: 11/09/1958  . Quit date: 11/10/2003  Smokeless Tobacco  . Never Used    Labs: Recent Review Flowsheet Data    Labs for ITP Cardiac and Pulmonary Rehab Latest Ref Rng & Units 12/23/2015 04/03/2016 04/09/2016 06/16/2016 06/19/2016   Cholestrol 100 - 199 mg/dL - - - 166 -   LDLCALC 0 - 99 mg/dL - - - 101(H) -   HDL >39 mg/dL - - - 37(L) -   Trlycerides 0 - 149  mg/dL - - - 138 -   Hemoglobin A1c - 5.8 - - - 5.7   PHART 7.350 - 7.450 - 7.464(H) 7.428 - -   PCO2ART 35.0 - 45.0 mmHg - 34.8(L) 35.2 - -   HCO3 20.0 - 24.0 mEq/L - 24.7(H) 23.0 - -   TCO2 0 - 100 mmol/L - 25.7 24.1 - -   ACIDBASEDEF 0.0 - 2.0 mmol/L - - 0.9 - -   O2SAT % - 96.3 92.5 - -      Capillary Blood Glucose: Lab Results  Component Value Date   GLUCAP 137 (H) 04/10/2016   GLUCAP 231 (H) 04/09/2016   GLUCAP 150 (H) 04/09/2016   GLUCAP 168 (H) 04/09/2016   GLUCAP 113 (H) 04/09/2016     ADL UCSD:     Pulmonary Assessment Scores    Row Name 09/01/16 1108         ADL UCSD   ADL Phase Entry     SOB Score total 68     Rest 0     Walk 6     Stairs 4     Bath 3     Dress 3     Shop 3       CAT Score   CAT Score 21       mMRC Score   mMRC Score 3        Pulmonary Function Assessment:     Pulmonary Function Assessment - 09/01/16 1105      Pulmonary Function Tests   FVC% 2.93 %   FEV1% 1.96 %   FEV1/FVC Ratio 67   RV% 2.89 %   DLCO% 72 %     Initial Spirometry Results   FVC% 2.93 %   FEV1% 1.96 %   FEV1/FVC Ratio 67     Post Bronchodilator Spirometry Results   FVC% 2.9 %   FEV1% 2.04 %   FEV1/FVC Ratio 70     Breath   Shortness of Breath Limiting activity      Exercise Target Goals:    Exercise Program Goal: Individual exercise prescription set with THRR, safety & activity barriers. Participant demonstrates ability to understand and report RPE using BORG scale, to self-measure pulse accurately, and to acknowledge the importance of the exercise prescription.  Exercise Prescription Goal: Starting with aerobic activity 30 plus minutes a day, 3 days per week for initial exercise prescription. Provide home exercise prescription and guidelines that participant acknowledges understanding prior to discharge.  Activity Barriers & Risk Stratification:     Activity Barriers & Cardiac Risk Stratification - 09/01/16 1102      Activity Barriers  & Cardiac  Risk Stratification   Activity Barriers Joint Problems  (L) hip joint   Cardiac Risk Stratification Low      6 Minute Walk:     6 Minute Walk    Row Name 09/01/16 1044         6 Minute Walk   Phase Initial     Distance 1400 feet     Walk Time 6 minutes     # of Rest Breaks 0     MPH 2.65     METS 3.03     RPE 12     Perceived Dyspnea  11     VO2 Peak 8.89     Symptoms No     Resting HR 85 bpm     Resting BP 120/62     Max Ex. HR 94 bpm     Max Ex. BP 136/70     2 Minute Post BP 118/62        Initial Exercise Prescription:     Initial Exercise Prescription - 09/01/16 1000      Date of Initial Exercise RX and Referring Provider   Date 09/01/16   Referring Provider Dr. Chase Caller     Treadmill   MPH 1.3   Grade 0   Minutes 15   METs 1.9     NuStep   Level 2   Watts 15   Minutes 20   METs 1.9     Prescription Details   Frequency (times per week) 3   Duration Progress to 30 minutes of continuous aerobic without signs/symptoms of physical distress     Intensity   THRR REST +  20   THRR 40-80% of Max Heartrate 110-123-135   Ratings of Perceived Exertion 11-13   Perceived Dyspnea 0-4     Progression   Progression Continue progressive overload as per policy without signs/symptoms or physical distress.     Resistance Training   Training Prescription Yes   Weight 1   Reps 10-12      Perform Capillary Blood Glucose checks as needed.  Exercise Prescription Changes:      Exercise Prescription Changes    Row Name 09/15/16 1200             Response to Exercise   Blood Pressure (Admit) 142/68       Blood Pressure (Exercise) 150/70       Blood Pressure (Exit) 114/60       Heart Rate (Admit) 91 bpm       Heart Rate (Exercise) 95 bpm       Heart Rate (Exit) 86 bpm       Oxygen Saturation (Admit) 96 %       Oxygen Saturation (Exercise) 96 %       Oxygen Saturation (Exit) 96 %       Rating of Perceived Exertion (Exercise) 9        Perceived Dyspnea (Exercise) 10       Duration Progress to 30 minutes of continuous aerobic without signs/symptoms of physical distress       Intensity Rest + 20         Progression   Progression Continue progressive overload as per policy without signs/symptoms or physical distress.         Resistance Training   Training Prescription Yes       Weight 2       Reps 10-12         Treadmill   MPH 1.5  Grade 0       Minutes 15       METs 2.1         NuStep   Level 3       Watts 13       Minutes 20       METs 3.65         Home Exercise Plan   Plans to continue exercise at Home       Frequency Add 2 additional days to program exercise sessions.          Exercise Comments:      Exercise Comments    Row Name 09/15/16 1234           Exercise Comments Patient is proggressing well.          Discharge Exercise Prescription (Final Exercise Prescription Changes):     Exercise Prescription Changes - 09/15/16 1200      Response to Exercise   Blood Pressure (Admit) 142/68   Blood Pressure (Exercise) 150/70   Blood Pressure (Exit) 114/60   Heart Rate (Admit) 91 bpm   Heart Rate (Exercise) 95 bpm   Heart Rate (Exit) 86 bpm   Oxygen Saturation (Admit) 96 %   Oxygen Saturation (Exercise) 96 %   Oxygen Saturation (Exit) 96 %   Rating of Perceived Exertion (Exercise) 9   Perceived Dyspnea (Exercise) 10   Duration Progress to 30 minutes of continuous aerobic without signs/symptoms of physical distress   Intensity Rest + 20     Progression   Progression Continue progressive overload as per policy without signs/symptoms or physical distress.     Resistance Training   Training Prescription Yes   Weight 2   Reps 10-12     Treadmill   MPH 1.5   Grade 0   Minutes 15   METs 2.1     NuStep   Level 3   Watts 13   Minutes 20   METs 3.65     Home Exercise Plan   Plans to continue exercise at Home   Frequency Add 2 additional days to program exercise sessions.       Nutrition:  Target Goals: Understanding of nutrition guidelines, daily intake of sodium '1500mg'$ , cholesterol '200mg'$ , calories 30% from fat and 7% or less from saturated fats, daily to have 5 or more servings of fruits and vegetables.  Biometrics:     Pre Biometrics - 09/01/16 1047      Pre Biometrics   Height '5\' 6"'$  (1.676 m)   Weight 212 lb 8.4 oz (96.4 kg)   Waist Circumference 45 inches   Hip Circumference 42 inches   Waist to Hip Ratio 1.07 %   BMI (Calculated) 34.4   Triceps Skinfold 16 mm   % Body Fat 33.1 %   Grip Strength 36.67 kg   Flexibility 11.67 in   Single Leg Stand 3 seconds       Nutrition Therapy Plan and Nutrition Goals:   Nutrition Discharge: Rate Your Plate Scores:     Nutrition Assessments - 09/01/16 1116      Rate Your Plate Scores   Pre Score 47      Psychosocial: Target Goals: Acknowledge presence or absence of depression, maximize coping skills, provide positive support system. Participant is able to verbalize types and ability to use techniques and skills needed for reducing stress and depression.  Initial Review & Psychosocial Screening:     Initial Psych Review & Screening - 09/01/16 1120  Initial Review   Current issues with --  No real concerns     Family Dynamics   Good Support System? Yes     Barriers   Psychosocial barriers to participate in program There are no identifiable barriers or psychosocial needs.  Patient QOL scores are 24.98.      Screening Interventions   Interventions Encouraged to exercise      Quality of Life Scores:     Quality of Life - 09/01/16 1048      Quality of Life Scores   Health/Function Pre 22.68 %   Socioeconomic Pre 27.92 %   Psych/Spiritual Pre 26.07 %   Family Pre 26.4 %   GLOBAL Pre 24.98 %      PHQ-9: Recent Review Flowsheet Data    Depression screen [facility] 2/9 09/01/2016 08/16/2014   Decreased Interest 0 0   Down, Depressed, Hopeless 0 0   PHQ - 2 Score 0 0    Altered sleeping 2 -   Tired, decreased energy 2 -   Change in appetite 1 -   Feeling bad or failure about yourself  0 -   Trouble concentrating 0 -   Moving slowly or fidgety/restless 0 -   Suicidal thoughts 0 -   PHQ-9 Score 5 -   Difficult doing work/chores Somewhat difficult -      Psychosocial Evaluation and Intervention:     Psychosocial Evaluation - 09/01/16 1121      Psychosocial Evaluation & Interventions   Interventions Encouraged to exercise with the program and follow exercise prescription   Comments Counseling not needed. QOL scores are WNL 24.98   Continued Psychosocial Services Needed No      Psychosocial Re-Evaluation:     Psychosocial Re-Evaluation    [place] Name 09/23/16 1427             Psychosocial Re-Evaluation   Interventions Encouraged to attend Pulmonary Rehabilitation for the exercise       Comments Patient continues to have no psychosocial issues identified.        Continued Psychosocial Services Needed No          Education: Education Goals: Education classes will be provided on a weekly basis, covering required topics. Participant will state understanding/return demonstration of topics presented.  Learning Barriers/Preferences:     Learning Barriers/Preferences - 09/01/16 1104      Learning Barriers/Preferences   Learning Barriers None   Learning Preferences Written Material;Pictoral;Video      Education Topics: How Lungs Work and Diseases: - Discuss the anatomy of the lungs and diseases that can affect the lungs, such as COPD.   Exercise: -Discuss the importance of exercise, FITT principles of exercise, normal and abnormal responses to exercise, and how to exercise safely.   Environmental Irritants: -Discuss types of environmental irritants and how to limit exposure to environmental irritants.   Meds/Inhalers and oxygen: - Discuss respiratory medications, definition of an inhaler and oxygen, and the proper way to use an  inhaler and oxygen.   Energy Saving Techniques: - Discuss methods to conserve energy and decrease shortness of breath when performing activities of daily living.    Bronchial Hygiene / Breathing Techniques: - Discuss breathing mechanics, pursed-lip breathing technique,  proper posture, effective ways to clear airways, and other functional breathing techniques   Cleaning Equipment: - Provides group verbal and written instruction about the health risks of elevated stress, cause of high stress, and healthy ways to reduce stress.   Nutrition I: Fats: - Discuss  the types of cholesterol, what cholesterol does to the body, and how cholesterol levels can be controlled.   Nutrition II: Labels: -Discuss the different components of food labels and how to read food labels.   Respiratory Infections: - Discuss the signs and symptoms of respiratory infections, ways to prevent respiratory infections, and the importance of seeking medical treatment when having a respiratory infection.   Stress I: Signs and Symptoms: - Discuss the causes of stress, how stress may lead to anxiety and depression, and ways to limit stress.   Stress II: Relaxation: -Discuss relaxation techniques to limit stress.   Oxygen for Home/Travel: - Discuss how to prepare for travel when on oxygen and proper ways to transport and store oxygen to ensure safety. Flowsheet Row PULMONARY REHAB OTHER RESPIRATORY from 09/03/2016 in [place]  Date  09/03/16  Educator  Lisabeth Register  Instruction Review Code  2- meets goals/outcomes      Knowledge Questionnaire Score:     Knowledge Questionnaire Score - 09/01/16 1104      Knowledge Questionnaire Score   Pre Score 9/14      Core Components/Risk Factors/Patient Goals at Admission:     Personal Goals and Risk Factors at Admission - 09/01/16 1117      Core Components/Risk Factors/Patient Goals on Admission    Weight Management Weight Loss    Increase Strength and Stamina Yes   Intervention Provide advice, education, support and counseling about physical activity/exercise needs.;Develop an individualized exercise prescription for aerobic and resistive training based on initial evaluation findings, risk stratification, comorbidities and participant's personal goals.   Expected Outcomes Achievement of increased cardiorespiratory fitness and enhanced flexibility, muscular endurance and strength shown through measurements of functional capacity and personal statement of participant.   Improve shortness of breath with ADL's Yes   Intervention Provide education, individualized exercise plan and daily activity instruction to help decrease symptoms of SOB with activities of daily living.   Expected Outcomes Short Term: Achieves a reduction of symptoms when performing activities of daily living.   Personal Goal Other Yes   Personal Goal Improve breathing, maintain good health   Intervention Attend PR 2 x week and supplement his exercise at home with 3 x week activity.    Expected Outcomes Reach personal goal.       Core Components/Risk Factors/Patient Goals Review:      Goals and Risk Factor Review    Row Name 09/01/16 1119 09/23/16 1426           Core Components/Risk Factors/Patient Goals Review   Personal Goals Review Weight Management/Obesity;Increase Strength and Stamina;Improve shortness of breath with ADL's Weight Management/Obesity;Increase Strength and Stamina;Improve shortness of breath with ADL's      Review  - Patient new to program. He has attended 6 sessions. Will continue to monitor for progress.       Expected Outcomes  - Patient will complete program meeting his personal goals.          Core Components/Risk Factors/Patient Goals at Discharge (Final Review):      Goals and Risk Factor Review - 09/23/16 1426      Core Components/Risk Factors/Patient Goals Review   Personal Goals Review Weight  Management/Obesity;Increase Strength and Stamina;Improve shortness of breath with ADL's   Review Patient new to program. He has attended 6 sessions. Will continue to monitor for progress.    Expected Outcomes Patient will complete program meeting his personal goals.       ITP Comments:  Comments: ITP 30 Day REVIEW Pt is making expected progress toward pulmonary rehab goals after completing 6 sessions. Recommend continued exercise, life style modification, education, and utilization of breathing techniques to increase stamina and strength and decrease shortness of breath with exertion.

## 2016-09-24 ENCOUNTER — Encounter (HOSPITAL_COMMUNITY)
Admission: RE | Admit: 2016-09-24 | Discharge: 2016-09-24 | Disposition: A | Discharge status: Home / Self Care | Payer: Medicare Other | Source: Ambulatory Visit | Attending: Internal Medicine | Admitting: Internal Medicine

## 2016-09-24 DIAGNOSIS — R0602 Shortness of breath: Secondary | ICD-10-CM | POA: Diagnosis not present

## 2016-09-24 NOTE — Progress Notes (Signed)
Daily Session Note  Patient Details  Name: Miguel Morales MRN: [id number] Date of Birth: 05-17-1946 Referring Provider:   Flowsheet Row PULMONARY REHAB OTHER RESP ORIENTATION from 09/01/2016 in [place]  Referring Provider  Dr. Chase Caller      Encounter Date: 09/24/2016  Check In:     Session Check In - 09/24/16 1330      Check-In   Location [facility]   Staff Present Darene Nappi Angelina Pih, MS, EP, [facility], Exercise Physiologist;Debra Wynetta Emery, RN, BSN   Supervising physician immediately available to respond to emergencies See telemetry face sheet for immediately available MD   Medication changes reported     No   Fall or balance concerns reported    No   Warm-up and Cool-down Performed as group-led instruction   Resistance Training Performed Yes   VAD Patient? No     Pain Assessment   Currently in Pain? No/denies   Pain Score 0-No pain   Multiple Pain Sites No      Capillary Blood Glucose: No results found for this or any previous visit (from the past 24 hour(s)).   Goals Met:  Independence with exercise equipment Improved SOB with ADL's Using PLB without cueing & demonstrates good technique Exercise tolerated well Queuing for purse lip breathing No report of cardiac concerns or symptoms Strength training completed today  Goals Unmet:  Not Applicable  Comments: Check out 2:30   Dr. Sinda Du is Medical Director for [facility] Pulmonary Rehab.

## 2016-09-29 ENCOUNTER — Encounter (HOSPITAL_COMMUNITY): Admission: RE | Admit: 2016-09-29 | Payer: Medicare Other | Source: Ambulatory Visit

## 2016-10-01 ENCOUNTER — Encounter (HOSPITAL_COMMUNITY): Payer: Medicare Other

## 2016-10-06 ENCOUNTER — Encounter (HOSPITAL_COMMUNITY)
Admission: RE | Admit: 2016-10-06 | Discharge: 2016-10-06 | Disposition: A | Discharge status: Home / Self Care | Payer: Medicare Other | Source: Ambulatory Visit | Attending: Internal Medicine | Admitting: Internal Medicine

## 2016-10-06 DIAGNOSIS — R0602 Shortness of breath: Secondary | ICD-10-CM | POA: Diagnosis not present

## 2016-10-06 NOTE — Progress Notes (Signed)
Daily Session Note  Patient Details  Name: Miguel Morales MRN: [id number] Date of Birth: 05-09-1946 Referring Provider:   [place] from 09/01/2016 in [place]  Referring Provider  Dr. Chase Caller      Encounter Date: 10/06/2016  Check In:     Session Check In - 10/06/16 1330      Check-In   Location [facility]   Staff Present Suzanne Boron, BS, EP, Exercise Physiologist;Diane Coad, MS, EP, [facility], Exercise Physiologist   Supervising physician immediately available to respond to emergencies See telemetry face sheet for immediately available MD   Medication changes reported     No   Fall or balance concerns reported    No   Warm-up and Cool-down Performed as group-led instruction   Resistance Training Performed Yes   VAD Patient? No     Pain Assessment   Currently in Pain? No/denies   Pain Score 0-No pain   Multiple Pain Sites No      Capillary Blood Glucose: No results found for this or any previous visit (from the past 24 hour(s)).   Goals Met:  Independence with exercise equipment Improved SOB with ADL's Using PLB without cueing & demonstrates good technique Exercise tolerated well No report of cardiac concerns or symptoms Strength training completed today  Goals Unmet:  Not Applicable  Comments: Check out 230   Dr. Sinda Du is Medical Director for [facility] Pulmonary Rehab.

## 2016-10-08 ENCOUNTER — Encounter (HOSPITAL_COMMUNITY)
Admission: RE | Admit: 2016-10-08 | Discharge: 2016-10-08 | Disposition: A | Discharge status: Home / Self Care | Payer: Medicare Other | Source: Ambulatory Visit | Attending: Internal Medicine | Admitting: Internal Medicine

## 2016-10-08 DIAGNOSIS — R0602 Shortness of breath: Secondary | ICD-10-CM

## 2016-10-08 NOTE — Progress Notes (Signed)
Daily Session Note  Patient Details  Name: UNDRAY ALLMAN MRN: [id number] Date of Birth: [birth year] Referring Provider:   [place] from 09/01/2016 in [place]  Referring Provider  Dr. Chase Caller      Encounter Date: 10/08/2016  Check In:     Session Check In - 10/08/16 1330      Check-In   Location [facility]   Staff Present Aundra Dubin, RN, BSN;Rolf Fells Luther Parody, BS, EP, Exercise Physiologist   Supervising physician immediately available to respond to emergencies See telemetry face sheet for immediately available MD   Medication changes reported     No   Fall or balance concerns reported    No   Warm-up and Cool-down Performed as group-led instruction   Resistance Training Performed Yes   VAD Patient? No     Pain Assessment   Currently in Pain? No/denies   Pain Score 0-No pain   Multiple Pain Sites No      Capillary Blood Glucose: No results found for this or any previous visit (from the past 24 hour(s)).   Goals Met:  Independence with exercise equipment Improved SOB with ADL's Using PLB without cueing & demonstrates good technique Exercise tolerated well No report of cardiac concerns or symptoms Strength training completed today  Goals Unmet:  Not Applicable  Comments: Check out 230   Dr. Sinda Du is Medical Director for [facility] Pulmonary Rehab.

## 2016-10-13 ENCOUNTER — Encounter (HOSPITAL_COMMUNITY)
Admission: RE | Admit: 2016-10-13 | Discharge: 2016-10-13 | Disposition: A | Discharge status: Home / Self Care | Payer: Medicare Other | Source: Ambulatory Visit | Attending: Internal Medicine | Admitting: Internal Medicine

## 2016-10-13 DIAGNOSIS — R0602 Shortness of breath: Secondary | ICD-10-CM | POA: Diagnosis not present

## 2016-10-13 NOTE — Progress Notes (Signed)
Daily Session Note  Patient Details  Name: TRAVANTI MCMANUS MRN: [id number] Date of Birth: [birth year] Referring Provider:   [place] from 09/01/2016 in [place]  Referring Provider  Dr. Chase Caller      Encounter Date: 10/13/2016  Check In:     Session Check In - 10/13/16 1330      Check-In   Location [facility]   Staff Present Diane Angelina Pih, MS, EP, [facility], Exercise Physiologist;Brack Shaddock Luther Parody, BS, EP, Exercise Physiologist   Supervising physician immediately available to respond to emergencies See telemetry face sheet for immediately available MD   Medication changes reported     No   Fall or balance concerns reported    No   Warm-up and Cool-down Performed as group-led instruction   Resistance Training Performed Yes   VAD Patient? No     Pain Assessment   Currently in Pain? No/denies   Pain Score 0-No pain   Multiple Pain Sites No      Capillary Blood Glucose: No results found for this or any previous visit (from the past 24 hour(s)).   Goals Met:  Independence with exercise equipment Improved SOB with ADL's Using PLB without cueing & demonstrates good technique Exercise tolerated well Strength training completed today  Goals Unmet:  Not Applicable  Comments: Check out 230   Dr. Sinda Du is Medical Director for [facility] Pulmonary Rehab.

## 2016-10-15 ENCOUNTER — Encounter (HOSPITAL_COMMUNITY)
Admission: RE | Admit: 2016-10-15 | Discharge: 2016-10-15 | Disposition: A | Discharge status: Home / Self Care | Payer: Medicare Other | Source: Ambulatory Visit | Attending: Internal Medicine | Admitting: Internal Medicine

## 2016-10-15 DIAGNOSIS — R0602 Shortness of breath: Secondary | ICD-10-CM | POA: Diagnosis not present

## 2016-10-15 NOTE — Progress Notes (Signed)
Daily Session Note  Patient Details  Name: Miguel Morales MRN: [id number] Date of Birth: Jun 05, 1946 Referring Provider:   [place] from 09/01/2016 in [place]  Referring Provider  Dr. Chase Caller      Encounter Date: 10/15/2016  Check In:     Session Check In - 10/15/16 1045      Check-In   Location [facility]   Staff Present Aundra Dubin, RN, BSN;Carmello Cabiness Luther Parody, BS, EP, Exercise Physiologist   Supervising physician immediately available to respond to emergencies See telemetry face sheet for immediately available MD   Medication changes reported     No   Fall or balance concerns reported    No   Warm-up and Cool-down Performed as group-led instruction   Resistance Training Performed Yes   VAD Patient? No     Pain Assessment   Currently in Pain? No/denies   Pain Score 0-No pain   Multiple Pain Sites No      Capillary Blood Glucose: No results found for this or any previous visit (from the past 24 hour(s)).   Goals Met:  Independence with exercise equipment Improved SOB with ADL's Using PLB without cueing & demonstrates good technique Exercise tolerated well No report of cardiac concerns or symptoms Strength training completed today  Goals Unmet:  Not Applicable  Comments: Check out 1145   Dr. Sinda Du is Medical Director for [facility] Pul[facility].

## 2016-10-20 ENCOUNTER — Encounter (HOSPITAL_COMMUNITY)
Admission: RE | Admit: 2016-10-20 | Discharge: 2016-10-20 | Disposition: A | Discharge status: Home / Self Care | Payer: Medicare Other | Source: Ambulatory Visit | Attending: Internal Medicine | Admitting: Internal Medicine

## 2016-10-20 DIAGNOSIS — R0602 Shortness of breath: Secondary | ICD-10-CM | POA: Diagnosis not present

## 2016-10-20 NOTE — Progress Notes (Signed)
Daily Session Note  Patient Details  Name: Miguel Morales MRN: [id number] Date of Birth: 1945/12/13 Referring Provider:   [place] from 09/01/2016 in [place]  Referring Provider  Dr. Chase Caller      Encounter Date: 10/20/2016  Check In:     Session Check In - 10/20/16 1330      Check-In   Location [facility]   Staff Present Suzanne Boron, BS, EP, Exercise Physiologist   Supervising physician immediately available to respond to emergencies See telemetry face sheet for immediately available MD   Medication changes reported     No   Fall or balance concerns reported    No   Warm-up and Cool-down Performed as group-led instruction   Resistance Training Performed Yes   VAD Patient? No     Pain Assessment   Currently in Pain? No/denies   Pain Score 0-No pain   Multiple Pain Sites No      Capillary Blood Glucose: No results found for this or any previous visit (from the past 24 hour(s)).   Goals Met:  Independence with exercise equipment Improved SOB with ADL's Using PLB without cueing & demonstrates good technique Exercise tolerated well No report of cardiac concerns or symptoms Strength training completed today  Goals Unmet:  Not Applicable  Comments: Check out 230   Dr. Sinda Du is Medical Director for [facility].

## 2016-10-21 NOTE — Progress Notes (Signed)
Pulmonary Individual Treatment Plan  Patient Details  Name: Miguel Morales MRN: [id number] Date of Birth: 01/28/1946 Referring Provider:   [place] from 09/01/2016 in [place]  Referring Provider  Dr. Chase Caller      Initial Encounter Date:  Flowsheet Row PULMONARY REHAB OTHER RESP ORIENTATION from 09/01/2016 in [place]  Date  09/01/16  Referring Provider  Dr. Chase Caller      Visit Diagnosis: SOB (shortness of breath)  Patient's Home Medications on Admission:   Current Outpatient Prescriptions:  .  acetaminophen (TYLENOL) 500 MG tablet, Take 1,000 mg by mouth every 6 (six) hours as needed. , Disp: , Rfl:  .  albuterol (PROVENTIL HFA;VENTOLIN HFA) 108 (90 Base) MCG/ACT inhaler, Inhale 2 puffs into the lungs every 6 (six) hours as needed for wheezing., Disp: 3 Inhaler, Rfl: 2 .  amLODipine (NORVASC) 10 MG tablet, TAKE 1 TABLET DAILY, Disp: 90 tablet, Rfl: 1 .  Ascorbic Acid (VITAMIN C) 1000 MG tablet, Take 1,000 mg by mouth daily., Disp: , Rfl:  .  aspirin 81 MG tablet, Take 81 mg by mouth daily., Disp: , Rfl:  .  atorvastatin (LIPITOR) 40 MG tablet, TAKE 1 TABLET DAILY (NEW DOSE), Disp: 90 tablet, Rfl: 0 .  budesonide-formoterol (SYMBICORT) 160-4.5 MCG/ACT inhaler, Inhale 2 puffs into the lungs 2 (two) times daily., Disp: , Rfl:  .  Coenzyme Q10 (COQ10) 400 MG CAPS, Take 1 capsule by mouth daily. , Disp: , Rfl:  .  Flaxseed, Linseed, (FLAXSEED OIL) 1000 MG CAPS, Take 1 capsule by mouth daily., Disp: , Rfl:  .  fluticasone (FLONASE) 50 MCG/ACT nasal spray, USE 1 SPRAY IN EACH NOSTRIL DAILY AS NEEDED, Disp: 16 g, Rfl: 4 .  hydrOXYzine (ATARAX/VISTARIL) 10 MG tablet, Take 10 mg by mouth at bedtime as needed., Disp: , Rfl:  .  ibuprofen (ADVIL,MOTRIN) 200 MG tablet, Take 800 mg by mouth 2 (two) times daily as needed. , Disp: , Rfl:  .  ketotifen (ZADITOR) 0.025 % ophthalmic solution, Place 1 drop  into both eyes 2 (two) times daily as needed., Disp: , Rfl:  .  lidocaine (XYLOCAINE) 5 % ointment, Apply 1 application topically as needed for mild pain (For Back and hip). , Disp: , Rfl:  .  loratadine (CLARITIN) 10 MG tablet, Take 10 mg by mouth daily as needed for allergies., Disp: , Rfl:  .  Magnesium 250 MG TABS, Take 1 tablet by mouth daily., Disp: , Rfl:  .  mometasone (ELOCON) 0.1 % lotion, APPLY TOPICALLY DAILY, Disp: 60 mL, Rfl: 3 .  omeprazole (PRILOSEC) 20 MG capsule, TAKE 1 CAPSULE DAILY AS NEEDED, Disp: 90 capsule, Rfl: 1 .  SPIRIVA HANDIHALER 18 MCG inhalation capsule, INHALE THE CONTENTS OF 1 CAPSULE DAILY, Disp: 90 capsule, Rfl: 1 .  tamsulosin (FLOMAX) 0.4 MG CAPS capsule, Take 1 tablet by mouth at bedtime. (Patient not taking: Reported on 09/01/2016), Disp: 90 capsule, Rfl: 1 .  triamcinolone (KENALOG) 0.025 % cream, Apply 1 application topically 2 (two) times daily. Reported on 05/14/2016, Disp: , Rfl:  .  Turmeric Curcumin 500 MG CAPS, Take 1 capsule by mouth daily., Disp: , Rfl:   Past Medical History: Past Medical History:  Diagnosis Date  . Arthritis   . Asthma   . Cancer ([place])    skin  . COPD (chronic obstructive pulmonary disease) ([place])   . GERD (gastroesophageal reflux disease)   . History of bronchitis   . Hypertension   .  Mixed hyperlipidemia   . Nocturia   . Shortness of breath dyspnea    with exertion  . Sleep apnea    will sometimes wear a CPAP  . Spinal stenosis     Tobacco Use: History  Smoking Status  . Former Smoker  . Packs/day: 1.00  . Years: 30.00  . Types: Cigarettes  . Start date: 11/09/1958  . Quit date: 11/10/2003  Smokeless Tobacco  . Never Used    Labs: Recent Review Flowsheet Data    Labs for ITP Cardiac and Pulmonary Rehab Latest Ref Rng & Units 12/23/2015 04/03/2016 04/09/2016 06/16/2016 06/19/2016   Cholestrol 100 - 199 mg/dL - - - 496 -   LDLCALC 0 - 99 mg/dL - - - 565(P) -   HDL >94 mg/dL - - - 37(N) -   Trlycerides 0 - 149  mg/dL - - - 907 -   Hemoglobin A1c - 5.8 - - - 5.7   PHART 7.350 - 7.450 - 7.464(H) 7.428 - -   PCO2ART 35.0 - 45.0 mmHg - 34.8(L) 35.2 - -   HCO3 20.0 - 24.0 mEq/L - 24.7(H) 23.0 - -   TCO2 0 - 100 mmol/L - 25.7 24.1 - -   ACIDBASEDEF 0.0 - 2.0 mmol/L - - 0.9 - -   O2SAT % - 96.3 92.5 - -      Capillary Blood Glucose: Lab Results  Component Value Date   GLUCAP 137 (H) 04/10/2016   GLUCAP 231 (H) 04/09/2016   GLUCAP 150 (H) 04/09/2016   GLUCAP 168 (H) 04/09/2016   GLUCAP 113 (H) 04/09/2016     ADL UCSD:     Pulmonary Assessment Scores    Row Name 09/01/16 1108         ADL UCSD   ADL Phase Entry     SOB Score total 68     Rest 0     Walk 6     Stairs 4     Bath 3     Dress 3     Shop 3       CAT Score   CAT Score 21       mMRC Score   mMRC Score 3        Pulmonary Function Assessment:     Pulmonary Function Assessment - 09/01/16 1105      Pulmonary Function Tests   FVC% 2.93 %   FEV1% 1.96 %   FEV1/FVC Ratio 67   RV% 2.89 %   DLCO% 72 %     Initial Spirometry Results   FVC% 2.93 %   FEV1% 1.96 %   FEV1/FVC Ratio 67     Post Bronchodilator Spirometry Results   FVC% 2.9 %   FEV1% 2.04 %   FEV1/FVC Ratio 70     Breath   Shortness of Breath Limiting activity      Exercise Target Goals:    Exercise Program Goal: Individual exercise prescription set with THRR, safety & activity barriers. Participant demonstrates ability to understand and report RPE using BORG scale, to self-measure pulse accurately, and to acknowledge the importance of the exercise prescription.  Exercise Prescription Goal: Starting with aerobic activity 30 plus minutes a day, 3 days per week for initial exercise prescription. Provide home exercise prescription and guidelines that participant acknowledges understanding prior to discharge.  Activity Barriers & Risk Stratification:     Activity Barriers & Cardiac Risk Stratification - 09/01/16 1102      Activity Barriers  & Cardiac  Risk Stratification   Activity Barriers Joint Problems  (L) hip joint   Cardiac Risk Stratification Low      6 Minute Walk:     6 Minute Walk    Row Name 09/01/16 1044         6 Minute Walk   Phase Initial     Distance 1400 feet     Walk Time 6 minutes     # of Rest Breaks 0     MPH 2.65     METS 3.03     RPE 12     Perceived Dyspnea  11     VO2 Peak 8.89     Symptoms No     Resting HR 85 bpm     Resting BP 120/62     Max Ex. HR 94 bpm     Max Ex. BP 136/70     2 Minute Post BP 118/62        Initial Exercise Prescription:     Initial Exercise Prescription - 09/01/16 1000      Date of Initial Exercise RX and Referring Provider   Date 09/01/16   Referring Provider Dr. Marchelle Gearing     Treadmill   MPH 1.3   Grade 0   Minutes 15   METs 1.9     NuStep   Level 2   Watts 15   Minutes 20   METs 1.9     Prescription Details   Frequency (times per week) 3   Duration Progress to 30 minutes of continuous aerobic without signs/symptoms of physical distress     Intensity   THRR REST +  20   THRR 40-80% of Max Heartrate 110-123-135   Ratings of Perceived Exertion 11-13   Perceived Dyspnea 0-4     Progression   Progression Continue progressive overload as per policy without signs/symptoms or physical distress.     Resistance Training   Training Prescription Yes   Weight 1   Reps 10-12      Perform Capillary Blood Glucose checks as needed.  Exercise Prescription Changes:      Exercise Prescription Changes    Row Name 09/15/16 1200 10/15/16 1500           Exercise Review   Progression  - Yes        Response to Exercise   Blood Pressure (Admit) 142/68 120/70      Blood Pressure (Exercise) 150/70 136/72      Blood Pressure (Exit) 114/60 134/70      Heart Rate (Admit) 91 bpm 90 bpm      Heart Rate (Exercise) 95 bpm 97 bpm      Heart Rate (Exit) 86 bpm 89 bpm      Oxygen Saturation (Admit) 96 % 94 %      Oxygen Saturation (Exercise)  96 % 95 %      Oxygen Saturation (Exit) 96 % 95 %      Rating of Perceived Exertion (Exercise) 9 9      Perceived Dyspnea (Exercise) 10 9      Duration Progress to 30 minutes of continuous aerobic without signs/symptoms of physical distress Progress to 30 minutes of continuous aerobic without signs/symptoms of physical distress      Intensity Rest + 20 Rest + 20        Progression   Progression Continue progressive overload as per policy without signs/symptoms or physical distress. Continue progressive overload as per policy without signs/symptoms or physical distress.  Resistance Training   Training Prescription Yes Yes      Weight 2 3      Reps 10-12 10-12        Treadmill   MPH 1.5 1.8      Grade 0 0      Minutes 15 15      METs 2.1 2.3        NuStep   Level 3 3      Watts 13 20      Minutes 20 20      METs 3.65 3.65        Home Exercise Plan   Plans to continue exercise at [place] 2 additional days to program exercise sessions. Add 2 additional days to program exercise sessions.         Exercise Comments:      Exercise Comments    Row Name 09/15/16 1234 10/15/16 1516         Exercise Comments Patient is proggressing well. Patient is progressing appropriately          Discharge Exercise Prescription (Final Exercise Prescription Changes):     Exercise Prescription Changes - 10/15/16 1500      Exercise Review   Progression Yes     Response to Exercise   Blood Pressure (Admit) 120/70   Blood Pressure (Exercise) 136/72   Blood Pressure (Exit) 134/70   Heart Rate (Admit) 90 bpm   Heart Rate (Exercise) 97 bpm   Heart Rate (Exit) 89 bpm   Oxygen Saturation (Admit) 94 %   Oxygen Saturation (Exercise) 95 %   Oxygen Saturation (Exit) 95 %   Rating of Perceived Exertion (Exercise) 9   Perceived Dyspnea (Exercise) 9   Duration Progress to 30 minutes of continuous aerobic without signs/symptoms of physical distress   Intensity Rest +  20     Progression   Progression Continue progressive overload as per policy without signs/symptoms or physical distress.     Resistance Training   Training Prescription Yes   Weight 3   Reps 10-12     Treadmill   MPH 1.8   Grade 0   Minutes 15   METs 2.3     NuStep   Level 3   Watts 20   Minutes 20   METs 3.65     Home Exercise Plan   Plans to continue exercise at Home   Frequency Add 2 additional days to program exercise sessions.      Nutrition:  Target Goals: Understanding of nutrition guidelines, daily intake of sodium '1500mg'$ , cholesterol '200mg'$ , calories 30% from fat and 7% or less from saturated fats, daily to have 5 or more servings of fruits and vegetables.  Biometrics:     Pre Biometrics - 09/01/16 1047      Pre Biometrics   Height '5\' 6"'$  (1.676 m)   Weight 212 lb 8.4 oz (96.4 kg)   Waist Circumference 45 inches   Hip Circumference 42 inches   Waist to Hip Ratio 1.07 %   BMI (Calculated) 34.4   Triceps Skinfold 16 mm   % Body Fat 33.1 %   Grip Strength 36.67 kg   Flexibility 11.67 in   Single Leg Stand 3 seconds       Nutrition Therapy Plan and Nutrition Goals:   Nutrition Discharge: Rate Your Plate Scores:     Nutrition Assessments - 09/01/16 1116      Rate Your Plate Scores  Pre Score 47      Psychosocial: Target Goals: Acknowledge presence or absence of depression, maximize coping skills, provide positive support system. Participant is able to verbalize types and ability to use techniques and skills needed for reducing stress and depression.  Initial Review & Psychosocial Screening:     Initial Psych Review & Screening - 09/01/16 1120      Initial Review   Current issues with --  No real concerns     Family Dynamics   Good Support System? Yes     Barriers   Psychosocial barriers to participate in program There are no identifiable barriers or psychosocial needs.  Patient QOL scores are 24.98.      Screening Interventions    Interventions Encouraged to exercise      Quality of Life Scores:     Quality of Life - 09/01/16 1048      Quality of Life Scores   Health/Function Pre 22.68 %   Socioeconomic Pre 27.92 %   Psych/Spiritual Pre 26.07 %   Family Pre 26.4 %   GLOBAL Pre 24.98 %      PHQ-9: Recent Review Flowsheet Data    Depression screen [facility] 2/9 09/01/2016 08/16/2014   Decreased Interest 0 0   Down, Depressed, Hopeless 0 0   PHQ - 2 Score 0 0   Altered sleeping 2 -   Tired, decreased energy 2 -   Change in appetite 1 -   Feeling bad or failure about yourself  0 -   Trouble concentrating 0 -   Moving slowly or fidgety/restless 0 -   Suicidal thoughts 0 -   PHQ-9 Score 5 -   Difficult doing work/chores Somewhat difficult -      Psychosocial Evaluation and Intervention:     Psychosocial Evaluation - 09/01/16 1121      Psychosocial Evaluation & Interventions   Interventions Encouraged to exercise with the program and follow exercise prescription   Comments Counseling not needed. QOL scores are WNL 24.98   Continued Psychosocial Services Needed No      Psychosocial Re-Evaluation:     Psychosocial Re-Evaluation    [place] Name 09/23/16 1427 10/21/16 1415           Psychosocial Re-Evaluation   Interventions Encouraged to attend Pulmonary Rehabilitation for the exercise Encouraged to attend Pulmonary Rehabilitation for the exercise      Comments Patient continues to have no psychosocial issues identified.  Patient's QOL score was 24.98. He continues to have no psychosocial issues identified.       Continued Psychosocial Services Needed No No         Education: Education Goals: Education classes will be provided on a weekly basis, covering required topics. Participant will state understanding/return demonstration of topics presented.  Learning Barriers/Preferences:     Learning Barriers/Preferences - 09/01/16 1104      Learning Barriers/Preferences   Learning Barriers None    Learning Preferences Written Material;Pictoral;Video      Education Topics: How Lungs Work and Diseases: - Discuss the anatomy of the lungs and diseases that can affect the lungs, such as COPD. Flowsheet Row PULMONARY REHAB OTHER RESPIRATORY from 10/15/2016 in [place]  Date  09/10/16  Educator  [place]  Instruction Review Code  2- meets goals/outcomes      Exercise: -Discuss the importance of exercise, FITT principles of exercise, normal and abnormal responses to exercise, and how to exercise safely. Flowsheet Row PULMONARY REHAB OTHER RESPIRATORY from 10/15/2016 in  [place]  Date  09/17/16  Educator  [facility]  Instruction Review Code  2- meets goals/outcomes      Environmental Irritants: -Discuss types of environmental irritants and how to limit exposure to environmental irritants. Flowsheet Row PULMONARY REHAB OTHER RESPIRATORY from 10/15/2016 in [place]  Date  09/24/16  Educator  Russella Dar  Instruction Review Code  2- meets goals/outcomes      Meds/Inhalers and oxygen: - Discuss respiratory medications, definition of an inhaler and oxygen, and the proper way to use an inhaler and oxygen.   Energy Saving Techniques: - Discuss methods to conserve energy and decrease shortness of breath when performing activities of daily living.  Flowsheet Row PULMONARY REHAB OTHER RESPIRATORY from 10/15/2016 in [place]  Date  10/08/16  Educator  Nils Flack  Instruction Review Code  2- meets goals/outcomes      Bronchial Hygiene / Breathing Techniques: - Discuss breathing mechanics, pursed-lip breathing technique,  proper posture, effective ways to clear airways, and other functional breathing techniques Flowsheet Row PULMONARY REHAB OTHER RESPIRATORY from 10/15/2016 in [place]  Date  10/15/16  Educator  Nils Flack  Instruction Review Code  2- meets goals/outcomes       Cleaning Equipment: - Provides group verbal and written instruction about the health risks of elevated stress, cause of high stress, and healthy ways to reduce stress.   Nutrition I: Fats: - Discuss the types of cholesterol, what cholesterol does to the body, and how cholesterol levels can be controlled.   Nutrition II: Labels: -Discuss the different components of food labels and how to read food labels.   Respiratory Infections: - Discuss the signs and symptoms of respiratory infections, ways to prevent respiratory infections, and the importance of seeking medical treatment when having a respiratory infection.   Stress I: Signs and Symptoms: - Discuss the causes of stress, how stress may lead to anxiety and depression, and ways to limit stress.   Stress II: Relaxation: -Discuss relaxation techniques to limit stress.   Oxygen for Home/Travel: - Discuss how to prepare for travel when on oxygen and proper ways to transport and store oxygen to ensure safety. Flowsheet Row PULMONARY REHAB OTHER RESPIRATORY from 10/15/2016 in [place]  Date  09/03/16  Educator  Lisabeth Register  Instruction Review Code  2- meets goals/outcomes      Knowledge Questionnaire Score:     Knowledge Questionnaire Score - 09/01/16 1104      Knowledge Questionnaire Score   Pre Score 9/14      Core Components/Risk Factors/Patient Goals at Admission:     Personal Goals and Risk Factors at Admission - 09/01/16 1117      Core Components/Risk Factors/Patient Goals on Admission    Weight Management Weight Loss   Increase Strength and Stamina Yes   Intervention Provide advice, education, support and counseling about physical activity/exercise needs.;Develop an individualized exercise prescription for aerobic and resistive training based on initial evaluation findings, risk stratification, comorbidities and participant's personal goals.   Expected Outcomes Achievement of  increased cardiorespiratory fitness and enhanced flexibility, muscular endurance and strength shown through measurements of functional capacity and personal statement of participant.   Improve shortness of breath with ADL's Yes   Intervention Provide education, individualized exercise plan and daily activity instruction to help decrease symptoms of SOB with activities of daily living.   Expected Outcomes Short Term: Achieves a reduction of symptoms when performing activities of daily living.  Personal Goal Other Yes   Personal Goal Improve breathing, maintain good health   Intervention Attend PR 2 x week and supplement his exercise at home with 3 x week activity.    Expected Outcomes Reach personal goal.       Core Components/Risk Factors/Patient Goals Review:      Goals and Risk Factor Review    Row Name 09/01/16 1119 09/23/16 1426 10/21/16 1413         Core Components/Risk Factors/Patient Goals Review   Personal Goals Review Weight Management/Obesity;Increase Strength and Stamina;Improve shortness of breath with ADL's Weight Management/Obesity;Increase Strength and Stamina;Improve shortness of breath with ADL's Weight Management/Obesity;Increase Strength and Stamina;Improve shortness of breath with ADL's  Improve breathing; maintain good health.     Review  - Patient new to program. He has attended 6 sessions. Will continue to monitor for progress.  Patient has attended 12 sessions gaining 5.3 lbs since starting the program. He has progressed some with increased strength and stamina and improved SOB.      Expected Outcomes  - Patient will complete program meeting his personal goals.  Patient will continue to attend sessions meeting his personal goals.         Core Components/Risk Factors/Patient Goals at Discharge (Final Review):      Goals and Risk Factor Review - 10/21/16 1413      Core Components/Risk Factors/Patient Goals Review   Personal Goals Review Weight  Management/Obesity;Increase Strength and Stamina;Improve shortness of breath with ADL's  Improve breathing; maintain good health.   Review Patient has attended 12 sessions gaining 5.3 lbs since starting the program. He has progressed some with increased strength and stamina and improved SOB.    Expected Outcomes Patient will continue to attend sessions meeting his personal goals.       ITP Comments:     ITP Comments    Row Name 10/15/16 1256           ITP Comments Patient met with Registered Dietitian to discuss nutrition topics including: Heart healthty eating, heart health cooking and make smart choices when shopping; Portion control; weight management; and hydration. Patient attended a group session with the hospital chaplian called [facility] to discuss and share how his recent pulmomary diagnosis has effected his life.          Comments: ITP 30 Day REVIEW Pt is making expected progress toward pulmonary rehab goals after completing 12 sessions. Recommend continued exercise, life style modification, education, and utilization of breathing techniques to increase stamina and strength and decrease shortness of breath with exertion.

## 2016-10-22 ENCOUNTER — Encounter (HOSPITAL_COMMUNITY)
Admission: RE | Admit: 2016-10-22 | Discharge: 2016-10-22 | Disposition: A | Discharge status: Home / Self Care | Payer: Medicare Other | Source: Ambulatory Visit | Attending: Internal Medicine | Admitting: Internal Medicine

## 2016-10-22 DIAGNOSIS — R0602 Shortness of breath: Secondary | ICD-10-CM | POA: Diagnosis not present

## 2016-10-22 NOTE — Progress Notes (Signed)
Daily Session Note  Patient Details  Name: Miguel Morales MRN: [id number] Date of Birth: 1946-08-24 Referring Provider:   Flowsheet Row PULMONARY REHAB OTHER RESP ORIENTATION from 09/01/2016 in [place]  Referring Provider  Dr. Chase Caller      Encounter Date: 10/22/2016  Check In:     Session Check In - 10/22/16 1330      Check-In   Location [facility]   Staff Present Aundra Dubin, RN, BSN;Payslee Bateson Luther Parody, BS, EP, Exercise Physiologist   Supervising physician immediately available to respond to emergencies See telemetry face sheet for immediately available MD   Medication changes reported     No   Fall or balance concerns reported    No   Warm-up and Cool-down Performed as group-led instruction   Resistance Training Performed Yes   VAD Patient? No     Pain Assessment   Currently in Pain? No/denies   Pain Score 0-No pain   Multiple Pain Sites No      Capillary Blood Glucose: No results found for this or any previous visit (from the past 24 hour(s)).   Goals Met:  Independence with exercise equipment Improved SOB with ADL's Using PLB without cueing & demonstrates good technique Exercise tolerated well No report of cardiac concerns or symptoms Strength training completed today  Goals Unmet:  Not Applicable  Comments: Check out 230   Dr. Sinda Du is Medical Director for [facility] Pulmonary Rehab.

## 2016-10-27 ENCOUNTER — Encounter (HOSPITAL_COMMUNITY)
Admission: RE | Admit: 2016-10-27 | Discharge: 2016-10-27 | Disposition: A | Discharge status: Home / Self Care | Payer: Medicare Other | Source: Ambulatory Visit | Attending: Internal Medicine | Admitting: Internal Medicine

## 2016-10-27 DIAGNOSIS — R0602 Shortness of breath: Secondary | ICD-10-CM | POA: Diagnosis not present

## 2016-10-27 DIAGNOSIS — E782 Mixed hyperlipidemia: Secondary | ICD-10-CM | POA: Diagnosis not present

## 2016-10-27 DIAGNOSIS — F5101 Primary insomnia: Secondary | ICD-10-CM | POA: Diagnosis not present

## 2016-10-27 DIAGNOSIS — I10 Essential (primary) hypertension: Secondary | ICD-10-CM | POA: Diagnosis not present

## 2016-10-27 DIAGNOSIS — G4733 Obstructive sleep apnea (adult) (pediatric): Secondary | ICD-10-CM | POA: Diagnosis not present

## 2016-10-27 NOTE — Progress Notes (Signed)
Daily Session Note  Patient Details  Name: Miguel Morales MRN: [id number] Date of Birth: 03-Feb-1946 Referring Provider:   [place] from 09/01/2016 in [place]  Referring Provider  Dr. Chase Caller      Encounter Date: 10/27/2016  Check In:     Session Check In - 10/27/16 1330      Check-In   Location [facility]   Staff Present Suzanne Boron, BS, EP, Exercise Physiologist   Supervising physician immediately available to respond to emergencies See telemetry face sheet for immediately available MD   Medication changes reported     No   Fall or balance concerns reported    No   Warm-up and Cool-down Performed as group-led instruction   Resistance Training Performed Yes   VAD Patient? No     Pain Assessment   Currently in Pain? No/denies   Pain Score 0-No pain   Multiple Pain Sites No      Capillary Blood Glucose: No results found for this or any previous visit (from the past 24 hour(s)).   Goals Met:  Independence with exercise equipment Improved SOB with ADL's Using PLB without cueing & demonstrates good technique Exercise tolerated well No report of cardiac concerns or symptoms Strength training completed today  Goals Unmet:  Not Applicable  Comments: Check out 230   Dr. Sinda Du is Medical Director for [facility] Pulmonary Re[facility].

## 2016-10-28 ENCOUNTER — Ambulatory Visit (INDEPENDENT_AMBULATORY_CARE_PROVIDER_SITE_OTHER): Payer: Medicare Other | Admitting: Internal Medicine

## 2016-10-28 ENCOUNTER — Encounter: Payer: Self-pay | Admitting: Internal Medicine

## 2016-10-28 VITALS — BP 138/76 | HR 104 | Ht 66.0 in | Wt 216.0 lb

## 2016-10-28 DIAGNOSIS — J449 Chronic obstructive pulmonary disease, unspecified: Secondary | ICD-10-CM | POA: Diagnosis not present

## 2016-10-28 LAB — PULMONARY FUNCTION TEST
DL/VA % PRED: 93 %
DL/VA: 4.06 ml/min/mmHg/L
DLCO COR: 15.36 ml/min/mmHg
DLCO UNC % PRED: 55 %
DLCO UNC: 14.95 ml/min/mmHg
DLCO cor % pred: 56 %
FEF 25-75 PRE: 0.53 L/s
FEF 25-75 Post: 1.48 L/sec
FEF2575-%CHANGE-POST: 180 %
FEF2575-%PRED-POST: 74 %
FEF2575-%Pred-Pre: 26 %
FEV1-%CHANGE-POST: 30 %
FEV1-%PRED-POST: 55 %
FEV1-%PRED-PRE: 42 %
FEV1-PRE: 1.14 L
FEV1-Post: 1.48 L
FEV1FVC-%CHANGE-POST: 4 %
FEV1FVC-%Pred-Pre: 90 %
FEV6-%CHANGE-POST: 27 %
FEV6-%PRED-POST: 62 %
FEV6-%PRED-PRE: 48 %
FEV6-PRE: 1.68 L
FEV6-Post: 2.14 L
FEV6FVC-%CHANGE-POST: 2 %
FEV6FVC-%PRED-PRE: 104 %
FEV6FVC-%Pred-Post: 107 %
FVC-%Change-Post: 24 %
FVC-%Pred-Post: 58 %
FVC-%Pred-Pre: 46 %
FVC-Post: 2.15 L
FVC-Pre: 1.73 L
POST FEV1/FVC RATIO: 69 %
POST FEV6/FVC RATIO: 100 %
PRE FEV6/FVC RATIO: 97 %
Pre FEV1/FVC ratio: 66 %

## 2016-10-28 NOTE — Progress Notes (Signed)
pft  

## 2016-10-28 NOTE — Progress Notes (Signed)
Subjective:     Patient ID: Miguel Morales, male   DOB: 1946/07/04, 70 y.o.   MRN: [id number]  HPI   PCP Sallee Lange, MD      OV 07/21/2016  Chief Complaint  Patient presents with  . Follow-up    Pt had lobectomy and states he is more SOB and isnt able to rebound as quickly. Pt c/o cough with intermittent mucus production with clear mucus. Pt states he still has some tenderness under left breast.     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 He underwent resection in summer 2017. Now he presents for follow-up. His incision has healed well. His only issue is that he is more short of breath than preoperatively. He has to stop after walking one fourth of a mile. He has not lost any weight. Infectious visceral obesity. He is on triple inhaler therapy. He has not had Pulmicort function testing after surgical resection of his left upper lobe. His cancer surveillance is through Dr. Julien Nordmann. He is not a candidate for any chemotherapy because of stage I lung cancer. He is appreciative of the fact that his cancer was caught and now he is cured potentially. He will have a flu shot today there are no other issues.  OV 10/28/2016  Chief Complaint  Patient presents with  . Follow-up    Pt here after PFT. Pt states his breathing is unchanged since last OV. Pt states he is attending pulm rehab. Pt c/o intermittent cough with little mucus production - clear mucus, c/o PND and left sided chest discomfort. Pt denies f/c/s.     Follow-up COPD. He also has stage I left upper lobe adenocarcinoma status post resection summer 2017. He presents for follow-up. He has had his first postoperative lung function test today and he shows that he has Gold stage 2 COPD. His FEV1 is severely 2 L/74%. Note 1.5 L/55%. He still in the gold stage II COPD range with this postbronchodilator lung function. Overall he is stable without any complaints. He is followed with oncology Dr.  Julien Nordmann in January 2018. He is on triple inhaler therapy. There are no new issues. He says he is up-to-date with all his respiratory vaccines. He is known pulmonary habitation and is helping him    Results for LANTZ, HERMANN (MRN [id number]) as of 10/28/2016 14:23  Ref. Range 01/02/2015 11:40 10/28/2016 12:54  FEV1-Post Latest Units: L 2.04 1.48  FEV1-%Pred-Post Latest Units: % 74 55  FEV1-%Change-Post Latest Units: % 4 30  Post FEV1/FVC ratio Latest Units: % 70 69     Results for HESHAM, WOMAC (MRN [id number]) as of 10/28/2016 14:23  Ref. Range 10/28/2016 12:54  DLCO cor Latest Units: ml/min/mmHg 15.36  DLCO cor % pred Latest Units: % 56     EXAM: CHEST  2 VIEW  COMPARISON:  Chest x-ray of 04/28/2016  FINDINGS: Postoperative scarring and volume loss in the left mid lung and left lung base is stable. The right lung is clear. Mediastinal and hilar contours are unremarkable. The heart is within normal limits in size. There are degenerative changes throughout the mid to lower thoracic spine.  IMPRESSION: Stable postsurgical changes on the left.  No active lung disease.   Electronically Signed   By: Ivar Drape M.D.   On: 06/30/2016 10:04   has a past medical history of Arthritis; Asthma; Cancer ([place]); COPD (chronic obstructive pulmonary disease) ([place]); GERD (gastroesophageal reflux disease); History of  bronchitis; Hypertension; Mixed hyperlipidemia; Nocturia; Shortness of breath dyspnea; Sleep apnea; and Spinal stenosis.   reports that he quit smoking about 12 years ago. His smoking use included Cigarettes. He started smoking about 58 years ago. He has a 30.00 pack-year smoking history. He has never used smokeless tobacco.  Past Surgical History:  Procedure Laterality Date  . CIRCUMCISION    . COLONOSCOPY  03/04/2004   OZH:[id number]-[id number] diverticula.  The remainder of the colonic mucosa appeared normal/normal rectum  . COLONOSCOPY  05/22/2009   GEX:BMWUXLK mucosa  appeared normal/Left-sided diverticula/Normal rectum  . COLONOSCOPY N/A 08/02/2014   Procedure: COLONOSCOPY;  Surgeon: Daneil Dolin, MD;  [facility];  Service: Endoscopy;  Laterality: N/A;  2:00 PM  . KNEE ARTHROSCOPY Right 2004  . LYMPH NODE DISSECTION N/A 04/08/2016   Procedure: LYMPH NODE DISSECTION;  Surgeon: Melrose Nakayama, MD;  Location: [place];  Service: Thoracic;  Laterality: N/A;  . SEPTOPLASTY    . VIDEO ASSISTED THORACOSCOPY (VATS)/WEDGE RESECTION Left 04/08/2016   Procedure: VIDEO ASSISTED THORACOSCOPY (VATS)/WEDGE RESECTION;  Surgeon: Melrose Nakayama, MD;  Location: [place];  Service: Thoracic;  Laterality: Left;  Marland Kitchen VIDEO BRONCHOSCOPY WITH ENDOBRONCHIAL NAVIGATION N/A 02/26/2016   Procedure: VIDEO BRONCHOSCOPY WITH ENDOBRONCHIAL NAVIGATION LEFT UPPER LOBE LUNG NODULE;  Surgeon: [name], MD;  Location: [facility];  Service: Thoracic;  Laterality: N/A;    Allergies  Allergen Reactions  . Adhesive [Tape] Other (See Comments)    blisters  . Sulfa Antibiotics Hives    Immunization History  Administered Date(s) Administered  . Influenza, High Dose Seasonal PF 07/21/2016  . Influenza-Unspecified 07/25/2013, 08/07/2014, 07/23/2015  . Pneumococcal Conjugate-13 05/09/2014  . Pneumococcal Polysaccharide-23 11/10/2011  . Td 11/09/2010  . Zoster 12/14/2012    Family History  Problem Relation Age of Onset  . Cancer Mother      Current Outpatient Prescriptions:  .  acetaminophen (TYLENOL) 500 MG tablet, Take 1,000 mg by mouth every 6 (six) hours as needed. , Disp: , Rfl:  .  albuterol (PROVENTIL HFA;VENTOLIN HFA) 108 (90 Base) MCG/ACT inhaler, Inhale 2 puffs into the lungs every 6 (six) hours as needed for wheezing., Disp: 3 Inhaler, Rfl: 2 .  amLODipine (NORVASC) 10 MG tablet, TAKE 1 TABLET DAILY, Disp: 90 tablet, Rfl: 1 .  Ascorbic Acid (VITAMIN C) 1000 MG tablet, Take 1,000 mg by mouth daily., Disp: , Rfl:  .  aspirin 81 MG tablet, Take 81 mg by mouth  daily., Disp: , Rfl:  .  atorvastatin (LIPITOR) 40 MG tablet, TAKE 1 TABLET DAILY (NEW DOSE), Disp: 90 tablet, Rfl: 0 .  budesonide-formoterol (SYMBICORT) 160-4.5 MCG/ACT inhaler, Inhale 2 puffs into the lungs 2 (two) times daily., Disp: , Rfl:  .  Coenzyme Q10 (COQ10) 400 MG CAPS, Take 1 capsule by mouth daily. , Disp: , Rfl:  .  Flaxseed, Linseed, (FLAXSEED OIL) 1000 MG CAPS, Take 1 capsule by mouth daily., Disp: , Rfl:  .  fluticasone (FLONASE) 50 MCG/ACT nasal spray, USE 1 SPRAY IN EACH NOSTRIL DAILY AS NEEDED, Disp: 16 g, Rfl: 4 .  hydrOXYzine (ATARAX/VISTARIL) 10 MG tablet, Take 10 mg by mouth at bedtime as needed., Disp: , Rfl:  .  ibuprofen (ADVIL,MOTRIN) 200 MG tablet, Take 800 mg by mouth 2 (two) times daily as needed. , Disp: , Rfl:  .  ketotifen (ZADITOR) 0.025 % ophthalmic solution, Place 1 drop into both eyes 2 (two) times daily as needed., Disp: , Rfl:  .  lidocaine (XYLOCAINE) 5 %  ointment, Apply 1 application topically as needed for mild pain (For Back and hip). , Disp: , Rfl:  .  loratadine (CLARITIN) 10 MG tablet, Take 10 mg by mouth daily as needed for allergies., Disp: , Rfl:  .  Magnesium 250 MG TABS, Take 1 tablet by mouth daily., Disp: , Rfl:  .  mometasone (ELOCON) 0.1 % lotion, APPLY TOPICALLY DAILY, Disp: 60 mL, Rfl: 3 .  omeprazole (PRILOSEC) 20 MG capsule, TAKE 1 CAPSULE DAILY AS NEEDED, Disp: 90 capsule, Rfl: 1 .  SPIRIVA HANDIHALER 18 MCG inhalation capsule, INHALE THE CONTENTS OF 1 CAPSULE DAILY, Disp: 90 capsule, Rfl: 1 .  tamsulosin (FLOMAX) 0.4 MG CAPS capsule, Take 1 tablet by mouth at bedtime., Disp: 90 capsule, Rfl: 1 .  triamcinolone (KENALOG) 0.025 % cream, Apply 1 application topically 2 (two) times daily. Reported on 05/14/2016, Disp: , Rfl:  .  Turmeric Curcumin 500 MG CAPS, Take 1 capsule by mouth daily., Disp: , Rfl:    Review of Systems     Objective:   Physical Exam  Constitutional: He is oriented to person, place, and time. He appears  well-developed and well-nourished. No distress.  HENT:  Head: Normocephalic and atraumatic.  Right Ear: External ear normal.  Left Ear: External ear normal.  Mouth/Throat: Oropharynx is clear and moist. No oropharyngeal exudate.  Eyes: Conjunctivae and EOM are normal. Pupils are equal, round, and reactive to light. Right eye exhibits no discharge. Left eye exhibits no discharge. No scleral icterus.  Neck: Normal range of motion. Neck supple. No JVD present. No tracheal deviation present. No thyromegaly present.  Cardiovascular: Normal rate, regular rhythm and intact distal pulses.  Exam reveals no gallop and no friction rub.   No murmur heard. Pulmonary/Chest: Effort normal and breath sounds normal. No respiratory distress. He has no wheezes. He has no rales. He exhibits no tenderness.  Abdominal: Soft. Bowel sounds are normal. He exhibits no distension and no mass. There is no tenderness. There is no rebound and no guarding.  Visceral obesity  Musculoskeletal: Normal range of motion. He exhibits no edema or tenderness.  Lymphadenopathy:    He has no cervical adenopathy.  Neurological: He is alert and oriented to person, place, and time. He has normal reflexes. No cranial nerve deficit. Coordination normal.  Skin: Skin is warm and dry. No rash noted. He is not diaphoretic. No erythema. No pallor.  Psychiatric: He has a normal mood and affect. His behavior is normal. Judgment and thought content normal.  Nursing note and vitals reviewed.  Vitals:   10/28/16 1413  BP: 138/76  Pulse: (!) 104  SpO2: 97%  Weight: 216 lb (98 kg)  Height: '5\' 6"'$  (1.676 m)    Estimated body mass index is 34.86 kg/m as calculated from the following:   Height as of this encounter: '5\' 6"'$  (1.676 m).   Weight as of this encounter: 216 lb (98 kg).     Assessment:       ICD-9-CM ICD-10-CM   1. Moderate COPD (chronic obstructive pulmonary disease) ([place]) 496 J44.9        Plan:      COPD appears stable  disease Without flare up to have worsened lung function compared to preoperative phase. Glad  pulmonary rehabilitation is helping you.   PLAN -- Continue Spiriva and Symbicort as before - Continue pulmonary rehabilitation -Oncology followup Jan 2018 with Dr Julien Nordmann as planned  Follow-up - Return in 9 months or sooner if needed  Dr. Brand Males, M.D., [facility].C.P Pulmonary and Critical Care Medicine Staff Physician [place] [facility] Pager: [phone], If no answer or between  15:00h - 7:00h: call [phone]  10/28/2016 2:34 PM

## 2016-10-28 NOTE — Patient Instructions (Addendum)
ICD-9-CM ICD-10-CM   1. Moderate COPD (chronic obstructive pulmonary disease) ([facility]) 496 J44.9      COPD appears stable disease Without flare up to have worsened lung function compared to preoperative phase. Glad  pulmonary rehabilitation is helping you.   PLAN -- Continue Spiriva and Symbicort as before - Continue pulmonary rehabilitation -Oncology followup Jan 2018 with Dr Julien Nordmann as planned  Follow-up - Return in 9 months or sooner if needed

## 2016-10-29 ENCOUNTER — Encounter (HOSPITAL_COMMUNITY)
Admission: RE | Admit: 2016-10-29 | Discharge: 2016-10-29 | Disposition: A | Discharge status: Home / Self Care | Payer: Medicare Other | Source: Ambulatory Visit | Attending: Internal Medicine | Admitting: Internal Medicine

## 2016-10-29 DIAGNOSIS — R0602 Shortness of breath: Secondary | ICD-10-CM | POA: Diagnosis not present

## 2016-10-29 NOTE — Progress Notes (Signed)
Daily Session Note  Patient Details  Name: MILLIE SHORB MRN: [id number] Date of Birth: 1946-03-11 Referring Provider:   [place] from 09/01/2016 in [place]  Referring Provider  Dr. Chase Caller      Encounter Date: 10/29/2016  Check In:     Session Check In - 10/29/16 1330      Check-In   Location [facility]   Staff Present Aundra Dubin, RN, BSN;Vaanya Shambaugh Luther Parody, BS, EP, Exercise Physiologist   Supervising physician immediately available to respond to emergencies See telemetry face sheet for immediately available MD   Medication changes reported     No   Fall or balance concerns reported    No   Warm-up and Cool-down Performed as group-led instruction   Resistance Training Performed Yes   VAD Patient? No     Pain Assessment   Currently in Pain? No/denies   Pain Score 0-No pain   Multiple Pain Sites No      Capillary Blood Glucose: No results found for this or any previous visit (from the past 24 hour(s)).   Goals Met:  Independence with exercise equipment Improved SOB with ADL's Using PLB without cueing & demonstrates good technique Exercise tolerated well No report of cardiac concerns or symptoms Strength training completed today  Goals Unmet:  Not Applicable  Comments: Check out 230   Dr. Sinda Du is Medical Director for [facility] Pulmonary Rehab.

## 2016-11-03 ENCOUNTER — Encounter (HOSPITAL_COMMUNITY)
Admission: RE | Admit: 2016-11-03 | Discharge: 2016-11-03 | Disposition: A | Discharge status: Home / Self Care | Payer: Medicare Other | Source: Ambulatory Visit | Attending: Internal Medicine | Admitting: Internal Medicine

## 2016-11-03 DIAGNOSIS — R0602 Shortness of breath: Secondary | ICD-10-CM | POA: Diagnosis not present

## 2016-11-03 NOTE — Progress Notes (Signed)
Daily Session Note  Patient Details  Name: Miguel Morales MRN: [id number] Date of Birth: 12/06/1945 Referring Provider:   Flowsheet Row PULMONARY REHAB OTHER RESP ORIENTATION from 09/01/2016 in [place] C[facility]ATION  Referring Provider  Dr. Ramaswamy      Encounter Date: 11/03/2016  Check In:     Session Check In - 11/03/16 1330      Check-In   Location [facility]   Staff Present Debra Johnson, RN, BSN;Danise Dehne, BS, EP, Exercise Physiologist   Supervising physician immediately available to respond to emergencies See telemetry face sheet for immediately available MD   Medication changes reported     No   Fall or balance concerns reported    No   Warm-up and Cool-down Performed as group-led instruction   Resistance Training Performed Yes   VAD Patient? No     Pain Assessment   Currently in Pain? No/denies   Pain Score 0-No pain   Multiple Pain Sites No      Capillary Blood Glucose: No results found for this or any previous visit (from the past 24 hour(s)).   Goals Met:  Independence with exercise equipment Improved SOB with ADL's Using PLB without cueing & demonstrates good technique Exercise tolerated well No report of cardiac concerns or symptoms Strength training completed today  Goals Unmet:  Not Applicable  Comments: Check out 230   Dr. Edward Hawkins is Medical Director for [place] [facility]. 

## 2016-11-05 ENCOUNTER — Encounter (HOSPITAL_COMMUNITY)
Admission: RE | Admit: 2016-11-05 | Discharge: 2016-11-05 | Disposition: A | Discharge status: Home / Self Care | Payer: Medicare Other | Source: Ambulatory Visit | Attending: Internal Medicine | Admitting: Internal Medicine

## 2016-11-05 DIAGNOSIS — R0602 Shortness of breath: Secondary | ICD-10-CM

## 2016-11-05 NOTE — Progress Notes (Signed)
Daily Session Note  Patient Details  Name: Miguel Morales MRN: [id number] Date of Birth: June 09, 1946 Referring Provider:   [place] from 09/01/2016 in [place]  Referring Provider  Dr. Chase Caller      Encounter Date: 11/05/2016  Check In:     Session Check In - 11/05/16 1339      Check-In   Location [facility]   Staff Present Suzanne Boron, BS, EP, Exercise Physiologist;Diane Coad, MS, EP, [facility], Exercise Physiologist   Supervising physician immediately available to respond to emergencies See telemetry face sheet for immediately available MD   Medication changes reported     No   Fall or balance concerns reported    No   Warm-up and Cool-down Performed as group-led instruction   Resistance Training Performed Yes   VAD Patient? No     Pain Assessment   Currently in Pain? No/denies   Pain Score 0-No pain   Multiple Pain Sites No      Capillary Blood Glucose: No results found for this or any previous visit (from the past 24 hour(s)).   Goals Met:  Independence with exercise equipment Improved SOB with ADL's Using PLB without cueing & demonstrates good technique Exercise tolerated well No report of cardiac concerns or symptoms Strength training completed today  Goals Unmet:  Not Applicable  Comments: Check out 230   Dr. Sinda Du is Medical Director for [facility] Pulmonary Rehab.

## 2016-11-10 ENCOUNTER — Encounter (HOSPITAL_COMMUNITY)
Admission: RE | Admit: 2016-11-10 | Discharge: 2016-11-10 | Disposition: A | Discharge status: Home / Self Care | Payer: Medicare Other | Source: Ambulatory Visit | Attending: Internal Medicine | Admitting: Internal Medicine

## 2016-11-10 DIAGNOSIS — R0602 Shortness of breath: Secondary | ICD-10-CM | POA: Diagnosis not present

## 2016-11-10 NOTE — Progress Notes (Signed)
Daily Session Note  Patient Details  Name: Miguel Morales MRN: [id number] Date of Birth: 01/02/1946 Referring Provider:   Flowsheet Row PULMONARY REHAB OTHER RESP ORIENTATION from 09/01/2016 in [place] CARDIAC REHABILITATION  Referring Provider  Dr. Ramaswamy      Encounter Date: 11/10/2016  Check In:     Session Check In - 11/10/16 1330      Check-In   Location [facility]   Staff Present Diane Coad, MS, EP, [facility], Exercise Physiologist;Kelbi Renstrom, BS, EP, Exercise Physiologist   Supervising physician immediately available to respond to emergencies See telemetry face sheet for immediately available MD   Medication changes reported     No   Fall or balance concerns reported    No   Warm-up and Cool-down Performed as group-led instruction   Resistance Training Performed Yes   VAD Patient? No     Pain Assessment   Currently in Pain? No/denies   Pain Score 0-No pain   Multiple Pain Sites No      Capillary Blood Glucose: No results found for this or any previous visit (from the past 24 hour(s)).   Goals Met:  Independence with exercise equipment Improved SOB with ADL's Using PLB without cueing & demonstrates good technique Exercise tolerated well No report of cardiac concerns or symptoms Strength training completed today  Goals Unmet:  Not Applicable  Comments: Check out 230   Dr. Edward Hawkins is Medical Director for [place]. 

## 2016-11-11 ENCOUNTER — Encounter (HOSPITAL_COMMUNITY): Payer: Self-pay

## 2016-11-11 ENCOUNTER — Ambulatory Visit (HOSPITAL_COMMUNITY)
Admission: RE | Admit: 2016-11-11 | Discharge: 2016-11-11 | Disposition: A | Discharge status: Home / Self Care | Payer: Medicare Other | Source: Ambulatory Visit | Attending: Internal Medicine | Admitting: Internal Medicine

## 2016-11-11 ENCOUNTER — Other Ambulatory Visit (HOSPITAL_BASED_OUTPATIENT_CLINIC_OR_DEPARTMENT_OTHER): Payer: Medicare Other

## 2016-11-11 DIAGNOSIS — C3492 Malignant neoplasm of unspecified part of left bronchus or lung: Secondary | ICD-10-CM | POA: Insufficient documentation

## 2016-11-11 DIAGNOSIS — C3412 Malignant neoplasm of upper lobe, left bronchus or lung: Secondary | ICD-10-CM

## 2016-11-11 DIAGNOSIS — Z9889 Other specified postprocedural states: Secondary | ICD-10-CM | POA: Insufficient documentation

## 2016-11-11 DIAGNOSIS — C3491 Malignant neoplasm of unspecified part of right bronchus or lung: Secondary | ICD-10-CM | POA: Diagnosis not present

## 2016-11-11 LAB — CBC WITH DIFFERENTIAL/PLATELET
BASO%: 0.3 % (ref 0.0–2.0)
BASOS ABS: 0 10*3/uL (ref 0.0–0.1)
EOS%: 1.5 % (ref 0.0–7.0)
Eosinophils Absolute: 0.1 10*3/uL (ref 0.0–0.5)
HEMATOCRIT: 39.9 % (ref 38.4–49.9)
HGB: 13.5 g/dL (ref 13.0–17.1)
LYMPH#: 1.7 10*3/uL (ref 0.9–3.3)
LYMPH%: 23.7 % (ref 14.0–49.0)
MCH: 31.8 pg (ref 27.2–33.4)
MCHC: 33.8 g/dL (ref 32.0–36.0)
MCV: 93.9 fL (ref 79.3–98.0)
MONO#: 0.6 10*3/uL (ref 0.1–0.9)
MONO%: 8.7 % (ref 0.0–14.0)
NEUT#: 4.8 10*3/uL (ref 1.5–6.5)
NEUT%: 65.8 % (ref 39.0–75.0)
Platelets: 238 10*3/uL (ref 140–400)
RBC: 4.25 10*6/uL (ref 4.20–5.82)
RDW: 14.4 % (ref 11.0–14.6)
WBC: 7.3 10*3/uL (ref 4.0–10.3)

## 2016-11-11 LAB — COMPREHENSIVE METABOLIC PANEL
ALT: 22 U/L (ref 0–55)
ANION GAP: 9 meq/L (ref 3–11)
AST: 22 U/L (ref 5–34)
Albumin: 3.8 g/dL (ref 3.5–5.0)
Alkaline Phosphatase: 93 U/L (ref 40–150)
BUN: 19.6 mg/dL (ref 7.0–26.0)
CALCIUM: 9.3 mg/dL (ref 8.4–10.4)
CHLORIDE: 107 meq/L (ref 98–109)
CO2: 26 mEq/L (ref 22–29)
Creatinine: 1.1 mg/dL (ref 0.7–1.3)
EGFR: 66 mL/min/{1.73_m2} — ABNORMAL LOW (ref >90)
Glucose: 105 mg/dl (ref 70–140)
POTASSIUM: 3.9 meq/L (ref 3.5–5.1)
Sodium: 142 mEq/L (ref 136–145)
Total Bilirubin: 0.69 mg/dL (ref 0.20–1.20)
Total Protein: 6.9 g/dL (ref 6.4–8.3)

## 2016-11-11 MED ORDER — IOPAMIDOL (ISOVUE-300) INJECTION 61%
75.0000 mL | Freq: Once | INTRAVENOUS | Status: AC | PRN
  Administered 2016-11-11: 75 mL via INTRAVENOUS

## 2016-11-12 ENCOUNTER — Encounter (HOSPITAL_COMMUNITY)
Admission: RE | Admit: 2016-11-12 | Discharge: 2016-11-12 | Disposition: A | Discharge status: Home / Self Care | Payer: Medicare Other | Source: Ambulatory Visit | Attending: Internal Medicine | Admitting: Internal Medicine

## 2016-11-12 DIAGNOSIS — R0602 Shortness of breath: Secondary | ICD-10-CM | POA: Diagnosis not present

## 2016-11-12 NOTE — Progress Notes (Signed)
Daily Session Note  Patient Details  Name: Miguel Morales MRN: [id number] Date of Birth: 03/07/1946 Referring Provider:   Flowsheet Row PULMONARY REHAB OTHER RESP ORIENTATION from 09/01/2016 in [place]  Referring Provider  Dr. Chase Caller      Encounter Date: 11/12/2016  Check In:     Session Check In - 11/12/16 1330      Check-In   Location [facility]   Staff Present Diane Angelina Pih, MS, EP, [facility], Exercise Physiologist;[name], RN, BSN   Supervising physician immediately available to respond to emergencies See telemetry face sheet for immediately available MD   Medication changes reported     No   Fall or balance concerns reported    No   Warm-up and Cool-down Performed as group-led instruction   Resistance Training Performed Yes   VAD Patient? No     Pain Assessment   Currently in Pain? No/denies   Pain Score 0-No pain   Multiple Pain Sites No      Capillary Blood Glucose: No results found for this or any previous visit (from the past 24 hour(s)).   Goals Met:  Independence with exercise equipment Exercise tolerated well No report of cardiac concerns or symptoms Strength training completed today  Goals Unmet:  Not Applicable  Comments: Check out 1430.   Dr. Sinda Du is Medical Director for [facility] Pulmonary Rehab.

## 2016-11-17 ENCOUNTER — Encounter (HOSPITAL_COMMUNITY)
Admission: RE | Admit: 2016-11-17 | Discharge: 2016-11-17 | Disposition: A | Discharge status: Home / Self Care | Payer: Medicare Other | Source: Ambulatory Visit | Attending: Internal Medicine | Admitting: Internal Medicine

## 2016-11-17 VITALS — Ht 66.0 in | Wt 213.1 lb

## 2016-11-17 DIAGNOSIS — R0602 Shortness of breath: Secondary | ICD-10-CM

## 2016-11-17 NOTE — Progress Notes (Signed)
Daily Session Note  Patient Details  Name: Miguel Morales MRN: [id number] Date of Birth: 09/21/1946 Referring Provider:   Flowsheet Row PULMONARY REHAB OTHER RESP ORIENTATION from 09/01/2016 in [place] CARDIAC REHABILITATION  Referring Provider  Dr. Ramaswamy      Encounter Date: 11/17/2016  Check In:     Session Check In - 11/17/16 1330      Check-In   Location [facility]   Staff Present Diane Coad, MS, EP, [facility], Exercise Physiologist;Ravina Milner, BS, EP, Exercise Physiologist   Supervising physician immediately available to respond to emergencies See telemetry face sheet for immediately available MD   Medication changes reported     No   Fall or balance concerns reported    No   Warm-up and Cool-down Performed as group-led instruction   Resistance Training Performed Yes   VAD Patient? No     Pain Assessment   Currently in Pain? No/denies   Pain Score 0-No pain   Multiple Pain Sites No      Capillary Blood Glucose: No results found for this or any previous visit (from the past 24 hour(s)).   Goals Met:  Independence with exercise equipment Improved SOB with ADL's Using PLB without cueing & demonstrates good technique Exercise tolerated well No report of cardiac concerns or symptoms Strength training completed today  Goals Unmet:  O2 Sat  Comments: Check out 230   Dr. Edward Hawkins is Medical Director for [place] [facility]. 

## 2016-11-18 ENCOUNTER — Telehealth: Payer: Self-pay | Admitting: Internal Medicine

## 2016-11-18 ENCOUNTER — Encounter: Payer: Self-pay | Admitting: Internal Medicine

## 2016-11-18 ENCOUNTER — Ambulatory Visit (HOSPITAL_BASED_OUTPATIENT_CLINIC_OR_DEPARTMENT_OTHER): Payer: Medicare Other | Admitting: Internal Medicine

## 2016-11-18 VITALS — BP 121/73 | HR 81 | Temp 98.5°F | Resp 18 | Ht 66.0 in | Wt 217.5 lb

## 2016-11-18 DIAGNOSIS — M48062 Spinal stenosis, lumbar region with neurogenic claudication: Secondary | ICD-10-CM

## 2016-11-18 DIAGNOSIS — Z85118 Personal history of other malignant neoplasm of bronchus and lung: Secondary | ICD-10-CM | POA: Diagnosis not present

## 2016-11-18 DIAGNOSIS — M545 Low back pain: Secondary | ICD-10-CM

## 2016-11-18 DIAGNOSIS — C3492 Malignant neoplasm of unspecified part of left bronchus or lung: Secondary | ICD-10-CM

## 2016-11-18 DIAGNOSIS — R0602 Shortness of breath: Secondary | ICD-10-CM

## 2016-11-18 NOTE — Telephone Encounter (Signed)
Appointments scheduled per 1/10 LOS. Patient given AVS report and calendars with future scheduled appointments. °

## 2016-11-18 NOTE — Progress Notes (Signed)
Pulmonary Individual Treatment Plan  Patient Details  Name: Miguel Morales MRN: [id number] Date of Birth: 07/29/1946 Referring Provider:   [place] from 09/01/2016 in [place]  Referring Provider  Dr. Chase Caller      Initial Encounter Date:  Flowsheet Row PULMONARY REHAB OTHER RESP ORIENTATION from 09/01/2016 in [place]  Date  09/01/16  Referring Provider  Dr. Chase Caller      Visit Diagnosis: SOB (shortness of breath)  Patient's Home Medications on Admission:   Current Outpatient Prescriptions:  .  acetaminophen (TYLENOL) 500 MG tablet, Take 1,000 mg by mouth every 6 (six) hours as needed. , Disp: , Rfl:  .  albuterol (PROVENTIL HFA;VENTOLIN HFA) 108 (90 Base) MCG/ACT inhaler, Inhale 2 puffs into the lungs every 6 (six) hours as needed for wheezing., Disp: 3 Inhaler, Rfl: 2 .  amLODipine (NORVASC) 10 MG tablet, TAKE 1 TABLET DAILY, Disp: 90 tablet, Rfl: 1 .  Ascorbic Acid (VITAMIN C) 1000 MG tablet, Take 1,000 mg by mouth daily., Disp: , Rfl:  .  aspirin 81 MG tablet, Take 81 mg by mouth daily., Disp: , Rfl:  .  atorvastatin (LIPITOR) 40 MG tablet, TAKE 1 TABLET DAILY (NEW DOSE), Disp: 90 tablet, Rfl: 0 .  budesonide-formoterol (SYMBICORT) 160-4.5 MCG/ACT inhaler, Inhale 2 puffs into the lungs 2 (two) times daily., Disp: , Rfl:  .  Coenzyme Q10 (COQ10) 400 MG CAPS, Take 1 capsule by mouth daily. , Disp: , Rfl:  .  Flaxseed, Linseed, (FLAXSEED OIL) 1000 MG CAPS, Take 1 capsule by mouth daily., Disp: , Rfl:  .  fluticasone (FLONASE) 50 MCG/ACT nasal spray, USE 1 SPRAY IN EACH NOSTRIL DAILY AS NEEDED, Disp: 16 g, Rfl: 4 .  hydrOXYzine (ATARAX/VISTARIL) 10 MG tablet, Take 10 mg by mouth at bedtime as needed., Disp: , Rfl:  .  ibuprofen (ADVIL,MOTRIN) 200 MG tablet, Take 800 mg by mouth 2 (two) times daily as needed. , Disp: , Rfl:  .  ketotifen (ZADITOR) 0.025 % ophthalmic solution, Place 1 drop  into both eyes 2 (two) times daily as needed., Disp: , Rfl:  .  lidocaine (XYLOCAINE) 5 % ointment, Apply 1 application topically as needed for mild pain (For Back and hip). , Disp: , Rfl:  .  loratadine (CLARITIN) 10 MG tablet, Take 10 mg by mouth daily as needed for allergies., Disp: , Rfl:  .  Magnesium 250 MG TABS, Take 1 tablet by mouth daily., Disp: , Rfl:  .  mometasone (ELOCON) 0.1 % lotion, APPLY TOPICALLY DAILY, Disp: 60 mL, Rfl: 3 .  omeprazole (PRILOSEC) 20 MG capsule, TAKE 1 CAPSULE DAILY AS NEEDED, Disp: 90 capsule, Rfl: 1 .  SPIRIVA HANDIHALER 18 MCG inhalation capsule, INHALE THE CONTENTS OF 1 CAPSULE DAILY, Disp: 90 capsule, Rfl: 1 .  tamsulosin (FLOMAX) 0.4 MG CAPS capsule, Take 1 tablet by mouth at bedtime., Disp: 90 capsule, Rfl: 1 .  triamcinolone (KENALOG) 0.025 % cream, Apply 1 application topically 2 (two) times daily. Reported on 05/14/2016, Disp: , Rfl:  .  Turmeric Curcumin 500 MG CAPS, Take 1 capsule by mouth daily., Disp: , Rfl:   Past Medical History: Past Medical History:  Diagnosis Date  . Arthritis   . Asthma   . Cancer ([place])    skin  . COPD (chronic obstructive pulmonary disease) ([place])   . GERD (gastroesophageal reflux disease)   . History of bronchitis   . Hypertension   . Mixed hyperlipidemia   .  Nocturia   . Shortness of breath dyspnea    with exertion  . Sleep apnea    will sometimes wear a CPAP  . Spinal stenosis     Tobacco Use: History  Smoking Status  . Former Smoker  . Packs/day: 1.00  . Years: 30.00  . Types: Cigarettes  . Start date: 11/09/1958  . Quit date: 11/10/2003  Smokeless Tobacco  . Never Used    Labs: Recent Review Flowsheet Data    Labs for ITP Cardiac and Pulmonary Rehab Latest Ref Rng & Units 12/23/2015 04/03/2016 04/09/2016 06/16/2016 06/19/2016   Cholestrol 100 - 199 mg/dL - - - 166 -   LDLCALC 0 - 99 mg/dL - - - 101(H) -   HDL >39 mg/dL - - - 37(L) -   Trlycerides 0 - 149 mg/dL - - - 138 -   Hemoglobin A1c - 5.8 - - -  5.7   PHART 7.350 - 7.450 - 7.464(H) 7.428 - -   PCO2ART 35.0 - 45.0 mmHg - 34.8(L) 35.2 - -   HCO3 20.0 - 24.0 mEq/L - 24.7(H) 23.0 - -   TCO2 0 - 100 mmol/L - 25.7 24.1 - -   ACIDBASEDEF 0.0 - 2.0 mmol/L - - 0.9 - -   O2SAT % - 96.3 92.5 - -      Capillary Blood Glucose: Lab Results  Component Value Date   GLUCAP 137 (H) 04/10/2016   GLUCAP 231 (H) 04/09/2016   GLUCAP 150 (H) 04/09/2016   GLUCAP 168 (H) 04/09/2016   GLUCAP 113 (H) 04/09/2016     ADL UCSD:     Pulmonary Assessment Scores    Row Name 09/01/16 1108         ADL UCSD   ADL Phase Entry     SOB Score total 68     Rest 0     Walk 6     Stairs 4     Bath 3     Dress 3     Shop 3       CAT Score   CAT Score 21       mMRC Score   mMRC Score 3        Pulmonary Function Assessment:     Pulmonary Function Assessment - 09/01/16 1105      Pulmonary Function Tests   FVC% 2.93 %   FEV1% 1.96 %   FEV1/FVC Ratio 67   RV% 2.89 %   DLCO% 72 %     Initial Spirometry Results   FVC% 2.93 %   FEV1% 1.96 %   FEV1/FVC Ratio 67     Post Bronchodilator Spirometry Results   FVC% 2.9 %   FEV1% 2.04 %   FEV1/FVC Ratio 70     Breath   Shortness of Breath Limiting activity      Exercise Target Goals:    Exercise Program Goal: Individual exercise prescription set with THRR, safety & activity barriers. Participant demonstrates ability to understand and report RPE using BORG scale, to self-measure pulse accurately, and to acknowledge the importance of the exercise prescription.  Exercise Prescription Goal: Starting with aerobic activity 30 plus minutes a day, 3 days per week for initial exercise prescription. Provide home exercise prescription and guidelines that participant acknowledges understanding prior to discharge.  Activity Barriers & Risk Stratification:     Activity Barriers & Cardiac Risk Stratification - 09/01/16 1102      Activity Barriers & Cardiac Risk Stratification   Activity  Barriers Joint Problems  (L) hip joint   Cardiac Risk Stratification Low      6 Minute Walk:     6 Minute Walk    Row Name 09/01/16 1044         6 Minute Walk   Phase Initial     Distance 1400 feet     Walk Time 6 minutes     # of Rest Breaks 0     MPH 2.65     METS 3.03     RPE 12     Perceived Dyspnea  11     VO2 Peak 8.89     Symptoms No     Resting HR 85 bpm     Resting BP 120/62     Max Ex. HR 94 bpm     Max Ex. BP 136/70     2 Minute Post BP 118/62        Initial Exercise Prescription:     Initial Exercise Prescription - 09/01/16 1000      Date of Initial Exercise RX and Referring Provider   Date 09/01/16   Referring Provider Dr. Chase Caller     Treadmill   MPH 1.3   Grade 0   Minutes 15   METs 1.9     NuStep   Level 2   Watts 15   Minutes 20   METs 1.9     Prescription Details   Frequency (times per week) 3   Duration Progress to 30 minutes of continuous aerobic without signs/symptoms of physical distress     Intensity   THRR REST +  20   THRR 40-80% of Max Heartrate 110-123-135   Ratings of Perceived Exertion 11-13   Perceived Dyspnea 0-4     Progression   Progression Continue progressive overload as per policy without signs/symptoms or physical distress.     Resistance Training   Training Prescription Yes   Weight 1   Reps 10-12      Perform Capillary Blood Glucose checks as needed.  Exercise Prescription Changes:      Exercise Prescription Changes    Row Name 09/15/16 1200 10/15/16 1500 11/16/16 1200         Exercise Review   Progression  - Yes Yes       Response to Exercise   Blood Pressure (Admit) 142/68 120/70 110/62     Blood Pressure (Exercise) 150/70 136/72 130/70     Blood Pressure (Exit) 114/60 134/70 110/60     Heart Rate (Admit) 91 bpm 90 bpm 75 bpm     Heart Rate (Exercise) 95 bpm 97 bpm 84 bpm     Heart Rate (Exit) 86 bpm 89 bpm 87 bpm     Oxygen Saturation (Admit) 96 % 94 % 95 %     Oxygen Saturation  (Exercise) 96 % 95 % 95 %     Oxygen Saturation (Exit) 96 % 95 % 97 %     Rating of Perceived Exertion (Exercise) _0 Perceived Dyspnea (Exercise) _1 Duration Progress to 30 minutes of continuous aerobic without signs/symptoms of physical distress Progress to 30 minutes of continuous aerobic without signs/symptoms of physical distress Progress to 30 minutes of continuous aerobic without signs/symptoms of physical distress     Intensity Rest + 20 Rest + 20 Rest + 20       Progression   Progression Continue progressive overload as per policy without signs/symptoms  or physical distress. Continue progressive overload as per policy without signs/symptoms or physical distress. Continue progressive overload as per policy without signs/symptoms or physical distress.       Resistance Training   Training Prescription Yes Yes Yes     Weight _0 Reps 10-12 10-12 10-12       Treadmill   MPH 1.5 1.8 2     Grade 0 0 0     Minutes _1 METs 2.1 2.3 2.53       NuStep   Level _2 Watts _3 Minutes _4 METs 3.65 3.65 3.66       Home Exercise Plan   Plans to continue exercise at [facility]     Frequency Add 2 additional days to program exercise sessions. Add 2 additional days to program exercise sessions. Add 2 additional days to program exercise sessions.        Exercise Comments:      Exercise Comments    Row Name 09/15/16 1234 10/15/16 1516 11/16/16 1237       Exercise Comments Patient is proggressing well. Patient is progressing appropriately  Patient is progressing well        Discharge Exercise Prescription (Final Exercise Prescription Changes):     Exercise Prescription Changes - 11/16/16 1200      Exercise Review   Progression Yes     Response to Exercise   Blood Pressure (Admit) 110/62   Blood Pressure (Exercise) 130/70   Blood Pressure (Exit) 110/60   Heart Rate (Admit) 75 bpm   Heart Rate (Exercise) 84 bpm    Heart Rate (Exit) 87 bpm   Oxygen Saturation (Admit) 95 %   Oxygen Saturation (Exercise) 95 %   Oxygen Saturation (Exit) 97 %   Rating of Perceived Exertion (Exercise) 11   Perceived Dyspnea (Exercise) 11   Duration Progress to 30 minutes of continuous aerobic without signs/symptoms of physical distress   Intensity Rest + 20     Progression   Progression Continue progressive overload as per policy without signs/symptoms or physical distress.     Resistance Training   Training Prescription Yes   Weight 3   Reps 10-12     Treadmill   MPH 2   Grade 0   Minutes 15   METs 2.53     NuStep   Level 3   Watts 21   Minutes 20   METs 3.66     Home Exercise Plan   Plans to continue exercise at Home   Frequency Add 2 additional days to program exercise sessions.      Nutrition:  Target Goals: Understanding of nutrition guidelines, daily intake of sodium <1527m, cholesterol <2029m calories 30% from fat and 7% or less from saturated fats, daily to have 5 or more servings of fruits and vegetables.  Biometrics:     Pre Biometrics - 09/01/16 1047      Pre Biometrics   Height _5  (1.676 m)   Weight 212 lb 8.4 oz (96.4 kg)   Waist Circumference 45 inches   Hip Circumference 42 inches   Waist to Hip Ratio 1.07 %   BMI (Calculated) 34.4   Triceps Skinfold 16 mm   % Body Fat 33.1 %   Grip Strength 36.67 kg   Flexibility 11.67 in   Single Leg Stand  3 seconds       Nutrition Therapy Plan and Nutrition Goals:   Nutrition Discharge: Rate Your Plate Scores:     Nutrition Assessments - 09/01/16 1116      Rate Your Plate Scores   Pre Score 47      Psychosocial: Target Goals: Acknowledge presence or absence of depression, maximize coping skills, provide positive support system. Participant is able to verbalize types and ability to use techniques and skills needed for reducing stress and depression.  Initial Review & Psychosocial Screening:     Initial Psych Review &  Screening - 09/01/16 1120      Initial Review   Current issues with --  No real concerns     Family Dynamics   Good Support System? Yes     Barriers   Psychosocial barriers to participate in program There are no identifiable barriers or psychosocial needs.  Patient QOL scores are 24.98.      Screening Interventions   Interventions Encouraged to exercise      Quality of Life Scores:     Quality of Life - 09/01/16 1048      Quality of Life Scores   Health/Function Pre 22.68 %   Socioeconomic Pre 27.92 %   Psych/Spiritual Pre 26.07 %   Family Pre 26.4 %   GLOBAL Pre 24.98 %      PHQ-9: Recent Review Flowsheet Data    Depression screen [facility] 2/9 09/01/2016 08/16/2014   Decreased Interest 0 0   Down, Depressed, Hopeless 0 0   PHQ - 2 Score 0 0   Altered sleeping 2 -   Tired, decreased energy 2 -   Change in appetite 1 -   Feeling bad or failure about yourself  0 -   Trouble concentrating 0 -   Moving slowly or fidgety/restless 0 -   Suicidal thoughts 0 -   PHQ-9 Score 5 -   Difficult doing work/chores Somewhat difficult -      Psychosocial Evaluation and Intervention:     Psychosocial Evaluation - 09/01/16 1121      Psychosocial Evaluation & Interventions   Interventions Encouraged to exercise with the program and follow exercise prescription   Comments Counseling not needed. QOL scores are WNL 24.98   Continued Psychosocial Services Needed No      Psychosocial Re-Evaluation:     Psychosocial Re-Evaluation    [place] Name 09/23/16 1427 10/21/16 1415 11/18/16 1314         Psychosocial Re-Evaluation   Interventions Encouraged to attend Pulmonary Rehabilitation for the exercise Encouraged to attend Pulmonary Rehabilitation for the exercise Encouraged to attend Pulmonary Rehabilitation for the exercise     Comments Patient continues to have no psychosocial issues identified.  Patient's QOL score was 24.98. He continues to have no psychosocial issues identified.   Patient's QOL score was WNL at 24.98. He continues to have no psychosocial issues identified.      Continued Psychosocial Services Needed No No No        Education: Education Goals: Education classes will be provided on a weekly basis, covering required topics. Participant will state understanding/return demonstration of topics presented.  Learning Barriers/Preferences:     Learning Barriers/Preferences - 09/01/16 1104      Learning Barriers/Preferences   Learning Barriers None   Learning Preferences Written Material;Pictoral;Video      Education Topics: How Lungs Work and Diseases: - Discuss the anatomy of the lungs and diseases that can affect the lungs, such as COPD.  Flowsheet Row PULMONARY REHAB OTHER RESPIRATORY from 11/12/2016 in [place]  Date  09/10/16  Educator  [place]  Instruction Review Code  2- meets goals/outcomes      Exercise: -Discuss the importance of exercise, FITT principles of exercise, normal and abnormal responses to exercise, and how to exercise safely. Flowsheet Row PULMONARY REHAB OTHER RESPIRATORY from 11/12/2016 in [place]  Date  09/17/16  Educator  [place]  Instruction Review Code  2- meets goals/outcomes      Environmental Irritants: -Discuss types of environmental irritants and how to limit exposure to environmental irritants. Flowsheet Row PULMONARY REHAB OTHER RESPIRATORY from 11/12/2016 in [place]  Date  09/24/16  Educator  Russella Dar  Instruction Review Code  2- meets goals/outcomes      Meds/Inhalers and oxygen: - Discuss respiratory medications, definition of an inhaler and oxygen, and the proper way to use an inhaler and oxygen.   Energy Saving Techniques: - Discuss methods to conserve energy and decrease shortness of breath when performing activities of daily living.  Flowsheet Row PULMONARY REHAB OTHER RESPIRATORY from 11/12/2016 in [place]   Date  10/08/16  Educator  Nils Flack  Instruction Review Code  2- meets goals/outcomes      Bronchial Hygiene / Breathing Techniques: - Discuss breathing mechanics, pursed-lip breathing technique,  proper posture, effective ways to clear airways, and other functional breathing techniques Flowsheet Row PULMONARY REHAB OTHER RESPIRATORY from 11/12/2016 in [place]  Date  10/15/16  Educator  Nils Flack  Instruction Review Code  2- meets goals/outcomes      Cleaning Equipment: - Provides group verbal and written instruction about the health risks of elevated stress, cause of high stress, and healthy ways to reduce stress. Flowsheet Row PULMONARY REHAB OTHER RESPIRATORY from 11/12/2016 in [place]  Date  10/22/16  Educator  Diane coad  Instruction Review Code  2- meets goals/outcomes      Nutrition I: Fats: - Discuss the types of cholesterol, what cholesterol does to the body, and how cholesterol levels can be controlled. Flowsheet Row PULMONARY REHAB OTHER RESPIRATORY from 11/12/2016 in [place]  Date  10/29/16  Educator  Suzanne Boron  Instruction Review Code  2- meets goals/outcomes      Nutrition II: Labels: -Discuss the different components of food labels and how to read food labels. Flowsheet Row PULMONARY REHAB OTHER RESPIRATORY from 11/12/2016 in [place]  Date  11/05/16  Educator  Nils Flack  Instruction Review Code  2- meets goals/outcomes      Respiratory Infections: - Discuss the signs and symptoms of respiratory infections, ways to prevent respiratory infections, and the importance of seeking medical treatment when having a respiratory infection. Flowsheet Row PULMONARY REHAB OTHER RESPIRATORY from 11/12/2016 in [place]  Date  11/12/16  Educator  Lisabeth Register  Instruction Review Code  2- meets goals/outcomes      Stress I: Signs and  Symptoms: - Discuss the causes of stress, how stress may lead to anxiety and depression, and ways to limit stress.   Stress II: Relaxation: -Discuss relaxation techniques to limit stress.   Oxygen for Home/Travel: - Discuss how to prepare for travel when on oxygen and proper ways to transport and store oxygen to ensure safety. Flowsheet Row PULMONARY REHAB OTHER RESPIRATORY from 11/12/2016 in [place]  Date  09/03/16  Educator  Lisabeth Register  Instruction Review  Code  2- meets goals/outcomes      Knowledge Questionnaire Score:     Knowledge Questionnaire Score - 09/01/16 1104      Knowledge Questionnaire Score   Pre Score 9/14      Core Components/Risk Factors/Patient Goals at Admission:     Personal Goals and Risk Factors at Admission - 09/01/16 1117      Core Components/Risk Factors/Patient Goals on Admission    Weight Management Weight Loss   Increase Strength and Stamina Yes   Intervention Provide advice, education, support and counseling about physical activity/exercise needs.;Develop an individualized exercise prescription for aerobic and resistive training based on initial evaluation findings, risk stratification, comorbidities and participant's personal goals.   Expected Outcomes Achievement of increased cardiorespiratory fitness and enhanced flexibility, muscular endurance and strength shown through measurements of functional capacity and personal statement of participant.   Improve shortness of breath with ADL's Yes   Intervention Provide education, individualized exercise plan and daily activity instruction to help decrease symptoms of SOB with activities of daily living.   Expected Outcomes Short Term: Achieves a reduction of symptoms when performing activities of daily living.   Personal Goal Other Yes   Personal Goal Improve breathing, maintain good health   Intervention Attend PR 2 x week and supplement his exercise at home with 3 x week  activity.    Expected Outcomes Reach personal goal.       Core Components/Risk Factors/Patient Goals Review:      Goals and Risk Factor Review    Row Name 09/01/16 1119 09/23/16 1426 10/21/16 1413 11/18/16 1312       Core Components/Risk Factors/Patient Goals Review   Personal Goals Review Weight Management/Obesity;Increase Strength and Stamina;Improve shortness of breath with ADL's Weight Management/Obesity;Increase Strength and Stamina;Improve shortness of breath with ADL's Weight Management/Obesity;Increase Strength and Stamina;Improve shortness of breath with ADL's  Improve breathing; maintain good health. Weight Management/Obesity;Increase Strength and Stamina;Improve shortness of breath with ADL's  Improve breathing; maintain good health.    Review  - Patient new to program. He has attended 6 sessions. Will continue to monitor for progress.  Patient has attended 12 sessions gaining 5.3 lbs since starting the program. He has progressed some with increased strength and stamina and improved SOB.  Patient has attended 20 sessions with increased strength and stamina. He is progress well in the program. He has gained 3.3 lbs since starting the program. His has some improvement in SOB.     Expected Outcomes  - Patient will complete program meeting his personal goals.  Patient will continue to attend sessions meeting his personal goals.  Patient will continue to attend sessions and complete the program meeting her personal goals.        Core Components/Risk Factors/Patient Goals at Discharge (Final Review):      Goals and Risk Factor Review - 11/18/16 1312      Core Components/Risk Factors/Patient Goals Review   Personal Goals Review Weight Management/Obesity;Increase Strength and Stamina;Improve shortness of breath with ADL's  Improve breathing; maintain good health.   Review Patient has attended 20 sessions with increased strength and stamina. He is progress well in the program. He has  gained 3.3 lbs since starting the program. His has some improvement in SOB.    Expected Outcomes Patient will continue to attend sessions and complete the program meeting her personal goals.       ITP Comments:     ITP Comments    Row Name 10/15/16 1256  ITP Comments Patient met with Registered Dietitian to discuss nutrition topics including: Heart healthty eating, heart health cooking and make smart choices when shopping; Portion control; weight management; and hydration. Patient attended a group session with the hospital chaplian called [facility] to discuss and share how his recent pulmomary diagnosis has effected his life.          Comments: ITP REVIEW Pt is making expected progress toward pulmonary rehab goals after completing 20 sessions. Recommend continued exercise, life style modification, education, and utilization of breathing techniques to increase stamina and strength and decrease shortness of breath with exertion.

## 2016-11-18 NOTE — Progress Notes (Signed)
[facility] [phone]   [phone]  OFFICE PROGRESS NOTE  Lilyan Punt, MD [address] [place] [address]  DIAGNOSIS: Stage IA (T1a, N0, M0) non-small cell lung cancer, adenocarcinoma with negative EGFR, ALK, ROS 1 and BRAF mutations diagnosed in May 2017. PDL 1 expression is 10%  PRIOR THERAPY: Status post left upper lobe lingular segmentectomy with lymph node dissection under the care of Dr. Dorris Fetch on 04/08/2016.  CURRENT THERAPY: Observation.  INTERVAL HISTORY: Miguel Morales 71 y.o. male returns to the clinic today for follow-up visit. The patient is feeling fine today except for the baseline shortness breath increased with exertion. He is currently undergoing pulmonary rehabilitation and feeling better. He denied having any significant weight loss or night sweats. He has no fever or chills. He denied having any significant chest pain, cough or hemoptysis. He has no nausea or vomiting. He denied having any headache or visual changes. He had repeat CT scan of the chest performed recently and he is here for evaluation and discussion of his scan results.  MEDICAL HISTORY: Past Medical History:  Diagnosis Date  . Arthritis   . Asthma   . Cancer (HCC)    skin  . COPD (chronic obstructive pulmonary disease) (HCC)   . GERD (gastroesophageal reflux disease)   . History of bronchitis   . Hypertension   . Mixed hyperlipidemia   . Nocturia   . Shortness of breath dyspnea    with exertion  . Sleep apnea    will sometimes wear a CPAP  . Spinal stenosis     ALLERGIES:  is allergic to adhesive [tape] and sulfa antibiotics.  MEDICATIONS:  Current Outpatient Prescriptions  Medication Sig Dispense Refill  . acetaminophen (TYLENOL) 500 MG tablet Take 1,000 mg by mouth every 6 (six) hours as needed.     Marland Kitchen albuterol (PROVENTIL HFA;VENTOLIN HFA) 108 (90 Base) MCG/ACT inhaler Inhale 2 puffs into the lungs every 6 (six) hours as  needed for wheezing. 3 Inhaler 2  . amLODipine (NORVASC) 10 MG tablet TAKE 1 TABLET DAILY 90 tablet 1  . Ascorbic Acid (VITAMIN C) 1000 MG tablet Take 1,000 mg by mouth daily.    Marland Kitchen aspirin 81 MG tablet Take 81 mg by mouth daily.    Marland Kitchen atorvastatin (LIPITOR) 40 MG tablet TAKE 1 TABLET DAILY (NEW DOSE) 90 tablet 0  . budesonide-formoterol (SYMBICORT) 160-4.5 MCG/ACT inhaler Inhale 2 puffs into the lungs 2 (two) times daily.    . Coenzyme Q10 (COQ10) 400 MG CAPS Take 1 capsule by mouth daily.     . Flaxseed, Linseed, (FLAXSEED OIL) 1000 MG CAPS Take 1 capsule by mouth daily.    . fluticasone (FLONASE) 50 MCG/ACT nasal spray USE 1 SPRAY IN EACH NOSTRIL DAILY AS NEEDED 16 g 4  . hydrOXYzine (ATARAX/VISTARIL) 10 MG tablet Take 10 mg by mouth at bedtime as needed.    Marland Kitchen ibuprofen (ADVIL,MOTRIN) 200 MG tablet Take 800 mg by mouth 2 (two) times daily as needed.     Marland Kitchen ketotifen (ZADITOR) 0.025 % ophthalmic solution Place 1 drop into both eyes 2 (two) times daily as needed.    . lidocaine (XYLOCAINE) 5 % ointment Apply 1 application topically as needed for mild pain (For Back and hip).     . loratadine (CLARITIN) 10 MG tablet Take 10 mg by mouth daily as needed for allergies.    . Magnesium 250 MG TABS Take 1 tablet by mouth daily.    Marland Kitchen  mometasone (ELOCON) 0.1 % lotion APPLY TOPICALLY DAILY 60 mL 3  . omeprazole (PRILOSEC) 20 MG capsule TAKE 1 CAPSULE DAILY AS NEEDED 90 capsule 1  . SPIRIVA HANDIHALER 18 MCG inhalation capsule INHALE THE CONTENTS OF 1 CAPSULE DAILY 90 capsule 1  . tamsulosin (FLOMAX) 0.4 MG CAPS capsule Take 1 tablet by mouth at bedtime. 90 capsule 1  . triamcinolone (KENALOG) 0.025 % cream Apply 1 application topically 2 (two) times daily. Reported on 05/14/2016    . Turmeric Curcumin 500 MG CAPS Take 1 capsule by mouth daily.     No current facility-administered medications for this visit.     SURGICAL HISTORY:  Past Surgical History:  Procedure Laterality Date  . CIRCUMCISION    .  COLONOSCOPY  03/04/2004   WNP:IOPP-UGGPC diverticula.  The remainder of the colonic mucosa appeared normal/normal rectum  . COLONOSCOPY  05/22/2009   WTP:ELGKBOQ mucosa appeared normal/Left-sided diverticula/Normal rectum  . COLONOSCOPY N/A 08/02/2014   Procedure: COLONOSCOPY;  Surgeon: Corbin Ade, MD;  Location: [facility];  Service: Endoscopy;  Laterality: N/A;  2:00 PM  . KNEE ARTHROSCOPY Right 2004  . LYMPH NODE DISSECTION N/A 04/08/2016   Procedure: LYMPH NODE DISSECTION;  Surgeon: Loreli Slot, MD;  Location: [facility] OR;  Service: Thoracic;  Laterality: N/A;  . SEPTOPLASTY    . VIDEO ASSISTED THORACOSCOPY (VATS)/WEDGE RESECTION Left 04/08/2016   Procedure: VIDEO ASSISTED THORACOSCOPY (VATS)/WEDGE RESECTION;  Surgeon: Loreli Slot, MD;  Location: [facility] OR;  Service: Thoracic;  Laterality: Left;  Marland Kitchen VIDEO BRONCHOSCOPY WITH ENDOBRONCHIAL NAVIGATION N/A 02/26/2016   Procedure: VIDEO BRONCHOSCOPY WITH ENDOBRONCHIAL NAVIGATION LEFT UPPER LOBE LUNG NODULE;  Surgeon: Leslye Peer, MD;  Location: [facility] OR;  Service: Thoracic;  Laterality: N/A;    REVIEW OF SYSTEMS:  A comprehensive review of systems was negative except for: Respiratory: positive for dyspnea on exertion   PHYSICAL EXAMINATION: General appearance: alert, cooperative and no distress Head: Normocephalic, without obvious abnormality, atraumatic Neck: no adenopathy, no JVD, supple, symmetrical, trachea midline and thyroid not enlarged, symmetric, no tenderness/mass/nodules Lymph nodes: Cervical, supraclavicular, and axillary nodes normal. Resp: clear to auscultation bilaterally Back: symmetric, no curvature. ROM normal. No CVA tenderness. Cardio: regular rate and rhythm, S1, S2 normal, no murmur, click, rub or gallop GI: soft, non-tender; bowel sounds normal; no masses,  no organomegaly Extremities: extremities normal, atraumatic, no cyanosis or edema  ECOG PERFORMANCE STATUS: 1 - Symptomatic but completely  ambulatory  Blood pressure 121/73, pulse 81, temperature 98.5 F (36.9 C), temperature source Oral, resp. rate 18, height 5\' 6"  (1.676 m), weight 217 lb 8 oz (98.7 kg), SpO2 99 %.  LABORATORY DATA: Lab Results  Component Value Date   WBC 7.3 11/11/2016   HGB 13.5 11/11/2016   HCT 39.9 11/11/2016   MCV 93.9 11/11/2016   PLT 238 11/11/2016      Chemistry      Component Value Date/Time   NA 142 11/11/2016 1139   K 3.9 11/11/2016 1139   CL 103 06/16/2016 0822   CO2 26 11/11/2016 1139   BUN 19.6 11/11/2016 1139   CREATININE 1.1 11/11/2016 1139      Component Value Date/Time   CALCIUM 9.3 11/11/2016 1139   ALKPHOS 93 11/11/2016 1139   AST 22 11/11/2016 1139   ALT 22 11/11/2016 1139   BILITOT 0.69 11/11/2016 1139       RADIOGRAPHIC STUDIES: Ct Chest W Contrast  Result Date: 11/11/2016 CLINICAL DATA:  RIGHT lung carcinoma restaging. RIGHT lobectomy. Lobectomy  May 2017 EXAM: CT CHEST WITH CONTRAST TECHNIQUE: Multidetector CT imaging of the chest was performed during intravenous contrast administration. CONTRAST:  25m ISOVUE-300 IOPAMIDOL (ISOVUE-300) INJECTION 61% COMPARISON:  PET-CT scan 03/06/2016 FINDINGS: Cardiovascular: Coronary artery calcification and aortic atherosclerotic calcification. Mediastinum/Nodes: No axillary or supraclavicular adenopathy. Multiple small subcentimeter mediastinal nodes not changed. No Pericardial fluid. Esophagus normal. Lungs/Pleura: Interval resection with upper lobe nodule. There is band of pleural thickening along the LEFT oblique fissure measuring 18 mm (image 60, series 5). This thickening is best seen on sagittal image 92, series 7. No new nodularity otherwise. RIGHT lung is clear. Upper Abdomen: Limited view of the liver, kidneys, pancreas are unremarkable. Normal adrenal glands. Musculoskeletal: No aggressive IMPRESSION: 1. Interval resection of LEFT upper lobe nodule. 2. Thickening along the LEFT oblique fissure is favored benign postsurgical  change. 3. No mediastinal lymphadenopathy Electronically Signed   By: SSuzy BouchardM.D.   On: 11/11/2016 13:57    ASSESSMENT AND PLAN: This is a very pleasant 71years old white male with a stage IA non-small cell lung cancer status post left upper lobe lingular segmentectomy. The patient is currently on observation. His recent CT scan of the chest showed no evidence for disease recurrence. I discussed the scan results with the patient today. I recommended for him to continue on observation with repeat CT scan of the chest in 6 months. For the low back pain, the patient will continue his routine evaluation and follow-up visit by his primary care physician. He was advised to call immediately if he has any concerning symptoms in the interval. The patient voices understanding of current disease status and treatment options and is in agreement with the current care plan.  All questions were answered. The patient knows to call the clinic with any problems, questions or concerns. We can certainly see the patient much sooner if necessary.  I spent 10 minutes counseling the patient face to face. The total time spent in the appointment was 15 minutes.  Disclaimer: This note was dictated with voice recognition software. Similar sounding words can inadvertently be transcribed and may not be corrected upon review.

## 2016-11-19 ENCOUNTER — Encounter (HOSPITAL_COMMUNITY)
Admission: RE | Admit: 2016-11-19 | Discharge: 2016-11-19 | Disposition: A | Discharge status: Home / Self Care | Payer: Medicare Other | Source: Ambulatory Visit | Attending: Internal Medicine | Admitting: Internal Medicine

## 2016-11-19 DIAGNOSIS — R0602 Shortness of breath: Secondary | ICD-10-CM | POA: Diagnosis not present

## 2016-11-19 NOTE — Progress Notes (Signed)
Daily Session Note  Patient Details  Name: Miguel Morales MRN: [id number] Date of Birth: 1946/01/21 Referring Provider:   Flowsheet Row PULMONARY REHAB OTHER RESP ORIENTATION from 09/01/2016 in [place]  Referring Provider  Dr. Chase Caller      Encounter Date: 11/19/2016  Check In:     Session Check In - 11/19/16 1330      Check-In   Location [facility]   Staff Present Aundra Dubin, RN, BSN;Toniyah Dilmore Luther Parody, BS, EP, Exercise Physiologist   Supervising physician immediately available to respond to emergencies See telemetry face sheet for immediately available MD   Medication changes reported     No   Fall or balance concerns reported    No   Warm-up and Cool-down Performed as group-led instruction   Resistance Training Performed Yes   VAD Patient? No     Pain Assessment   Currently in Pain? No/denies   Pain Score 0-No pain   Multiple Pain Sites No      Capillary Blood Glucose: No results found for this or any previous visit (from the past 24 hour(s)).   Goals Met:  Independence with exercise equipment Improved SOB with ADL's Using PLB without cueing & demonstrates good technique Exercise tolerated well No report of cardiac concerns or symptoms Strength training completed today  Goals Unmet:  Not Applicable  Comments: Check out 230   Dr. Sinda Du is Medical Director for [facility] Pulmonary Rehab.

## 2016-11-24 ENCOUNTER — Encounter (HOSPITAL_COMMUNITY)
Admission: RE | Admit: 2016-11-24 | Discharge: 2016-11-24 | Disposition: A | Discharge status: Home / Self Care | Payer: Medicare Other | Source: Ambulatory Visit | Attending: Internal Medicine | Admitting: Internal Medicine

## 2016-11-24 DIAGNOSIS — R0602 Shortness of breath: Secondary | ICD-10-CM

## 2016-11-24 NOTE — Progress Notes (Signed)
Daily Session Note  Patient Details  Name: Miguel Morales MRN: [id number] Date of Birth: July 04, 1946 Referring Provider:   Flowsheet Row PULMONARY REHAB OTHER RESP ORIENTATION from 09/01/2016 in [place]  Referring Provider  Dr. Chase Caller      Encounter Date: 11/24/2016  Check In:     Session Check In - 11/24/16 1330      Check-In   Location [facility]   Staff Present Diane Angelina Pih, MS, EP, [facility], Exercise Physiologist;Anselma Herbel Luther Parody, BS, EP, Exercise Physiologist   Supervising physician immediately available to respond to emergencies See telemetry face sheet for immediately available MD   Medication changes reported     No   Fall or balance concerns reported    No   Warm-up and Cool-down Performed as group-led instruction   Resistance Training Performed Yes   VAD Patient? No     Pain Assessment   Currently in Pain? No/denies   Pain Score 0-No pain   Multiple Pain Sites No      Capillary Blood Glucose: No results found for this or any previous visit (from the past 24 hour(s)).   Goals Met:  Independence with exercise equipment Improved SOB with ADL's Using PLB without cueing & demonstrates good technique Exercise tolerated well No report of cardiac concerns or symptoms Strength training completed today  Goals Unmet:  Not Applicable  Comments: Check out 230   Dr. Sinda Du is Medical Director for [facility] Pulmonary Rehab.

## 2016-12-01 ENCOUNTER — Encounter (HOSPITAL_COMMUNITY)
Admission: RE | Admit: 2016-12-01 | Discharge: 2016-12-01 | Disposition: A | Discharge status: Home / Self Care | Payer: Medicare Other | Source: Ambulatory Visit | Attending: Internal Medicine | Admitting: Internal Medicine

## 2016-12-01 ENCOUNTER — Telehealth: Payer: Self-pay | Admitting: Family Medicine

## 2016-12-01 DIAGNOSIS — R0602 Shortness of breath: Secondary | ICD-10-CM

## 2016-12-01 DIAGNOSIS — R5383 Other fatigue: Secondary | ICD-10-CM

## 2016-12-01 DIAGNOSIS — R739 Hyperglycemia, unspecified: Secondary | ICD-10-CM

## 2016-12-01 DIAGNOSIS — I10 Essential (primary) hypertension: Secondary | ICD-10-CM

## 2016-12-01 DIAGNOSIS — E785 Hyperlipidemia, unspecified: Secondary | ICD-10-CM

## 2016-12-01 DIAGNOSIS — Z79899 Other long term (current) drug therapy: Secondary | ICD-10-CM

## 2016-12-01 DIAGNOSIS — Z0000 Encounter for general adult medical examination without abnormal findings: Secondary | ICD-10-CM

## 2016-12-01 DIAGNOSIS — Z125 Encounter for screening for malignant neoplasm of prostate: Secondary | ICD-10-CM

## 2016-12-01 NOTE — Progress Notes (Signed)
Daily Session Note  Patient Details  Name: Miguel Morales MRN: [id number] Date of Birth: 01/19/1946 Referring Provider:   Flowsheet Row PULMONARY REHAB OTHER RESP ORIENTATION from 09/01/2016 in [place] [facility]  Referring Provider  Dr. Ramaswamy      Encounter Date: 12/01/2016  Check In:     Session Check In - 12/01/16 1447      Check-In   Location [facility]   Staff Present Diane Coad, MS, EP, [facility], Exercise Physiologist;Baylie Drakes, BS, EP, Exercise Physiologist   Supervising physician immediately available to respond to emergencies See telemetry face sheet for immediately available MD   Medication changes reported     No   Fall or balance concerns reported    No   Warm-up and Cool-down Performed as group-led instruction   Resistance Training Performed Yes   VAD Patient? No     Pain Assessment   Currently in Pain? No/denies   Pain Score 0-No pain   Multiple Pain Sites No      Capillary Blood Glucose: No results found for this or any previous visit (from the past 24 hour(s)).   Goals Met:  Independence with exercise equipment Improved SOB with ADL's Using PLB without cueing & demonstrates good technique Exercise tolerated well No report of cardiac concerns or symptoms Strength training completed today  Goals Unmet:  Not Applicable  Comments: Check out 230   Dr. Edward Hawkins is Medical Director for  Pulmonary Rehab. 

## 2016-12-01 NOTE — Telephone Encounter (Signed)
Pt is scheduled for a PE on 12/28/16  Would like lab orders placed and please add testosterone as pt feels this is "out of whack"  Please call pt when done [phone]

## 2016-12-01 NOTE — Telephone Encounter (Signed)
Lipid, liver, metabolic 7, hemoglobin O8L, CBC, PSA-please let the patient know that we can check a testosterone if he would like but it is not recommended to be on any type of testosterone supplements because of increased risk of heart disease. But if patient still once testosterone level checked go ahead and do so

## 2016-12-02 NOTE — Telephone Encounter (Signed)
Discussed with pt. Pt wanted to test testosterone anyway even though no treatment would be given if low. Orders ready. Pt notified.

## 2016-12-03 ENCOUNTER — Encounter (HOSPITAL_COMMUNITY)
Admission: RE | Admit: 2016-12-03 | Discharge: 2016-12-03 | Disposition: A | Discharge status: Home / Self Care | Payer: Medicare Other | Source: Ambulatory Visit | Attending: Internal Medicine | Admitting: Internal Medicine

## 2016-12-03 DIAGNOSIS — R0602 Shortness of breath: Secondary | ICD-10-CM

## 2016-12-03 NOTE — Progress Notes (Signed)
Daily Session Note  Patient Details  Name: [name] MRN: [id number] Date of Birth: 08-22-1946 Referring Provider:   [place] from 09/01/2016 in [place]  Referring Provider  Dr. Chase Caller      Encounter Date: 12/03/2016  Check In:     Session Check In - 12/03/16 1330      Check-In   Location [facility]   Staff Present Aundra Dubin, RN, BSN;Patina Spanier Luther Parody, BS, EP, Exercise Physiologist   Supervising physician immediately available to respond to emergencies See telemetry face sheet for immediately available MD   Medication changes reported     No   Fall or balance concerns reported    No   Warm-up and Cool-down Performed as group-led instruction   Resistance Training Performed Yes   VAD Patient? No     Pain Assessment   Currently in Pain? No/denies   Pain Score 0-No pain   Multiple Pain Sites No      Capillary Blood Glucose: No results found for this or any previous visit (from the past 24 hour(s)).   Goals Met:  Independence with exercise equipment Improved SOB with ADL's Using PLB without cueing & demonstrates good technique Exercise tolerated well No report of cardiac concerns or symptoms Strength training completed today  Goals Unmet:  Not Applicable  Comments: Check out 230   Dr. Sinda Du is Medical Director for [facility] Pulmonary Rehab.

## 2016-12-04 DIAGNOSIS — Z79899 Other long term (current) drug therapy: Secondary | ICD-10-CM | POA: Diagnosis not present

## 2016-12-04 DIAGNOSIS — I10 Essential (primary) hypertension: Secondary | ICD-10-CM | POA: Diagnosis not present

## 2016-12-04 DIAGNOSIS — Z0000 Encounter for general adult medical examination without abnormal findings: Secondary | ICD-10-CM | POA: Diagnosis not present

## 2016-12-04 DIAGNOSIS — Z125 Encounter for screening for malignant neoplasm of prostate: Secondary | ICD-10-CM | POA: Diagnosis not present

## 2016-12-04 DIAGNOSIS — R5383 Other fatigue: Secondary | ICD-10-CM | POA: Diagnosis not present

## 2016-12-04 DIAGNOSIS — E785 Hyperlipidemia, unspecified: Secondary | ICD-10-CM | POA: Diagnosis not present

## 2016-12-04 DIAGNOSIS — R739 Hyperglycemia, unspecified: Secondary | ICD-10-CM | POA: Diagnosis not present

## 2016-12-05 LAB — CBC WITH DIFFERENTIAL/PLATELET
Basophils Absolute: 0 10*3/uL (ref 0.0–0.2)
Basos: 1 %
EOS (ABSOLUTE): 0.2 10*3/uL (ref 0.0–0.4)
EOS: 2 %
HEMATOCRIT: 42.5 % (ref 37.5–51.0)
HEMOGLOBIN: 14.1 g/dL (ref 13.0–17.7)
IMMATURE GRANS (ABS): 0.1 10*3/uL (ref 0.0–0.1)
Immature Granulocytes: 1 %
LYMPHS ABS: 1.7 10*3/uL (ref 0.7–3.1)
LYMPHS: 25 %
MCH: 31.1 pg (ref 26.6–33.0)
MCHC: 33.2 g/dL (ref 31.5–35.7)
MCV: 94 fL (ref 79–97)
Monocytes Absolute: 0.6 10*3/uL (ref 0.1–0.9)
Monocytes: 8 %
NEUTROS ABS: 4.5 10*3/uL (ref 1.4–7.0)
Neutrophils: 63 %
Platelets: 286 10*3/uL (ref 150–379)
RBC: 4.54 x10E6/uL (ref 4.14–5.80)
RDW: 14.2 % (ref 12.3–15.4)
WBC: 7 10*3/uL (ref 3.4–10.8)

## 2016-12-05 LAB — BASIC METABOLIC PANEL
BUN / CREAT RATIO: 14 (ref 10–24)
BUN: 15 mg/dL (ref 8–27)
CO2: 22 mmol/L (ref 18–29)
CREATININE: 1.04 mg/dL (ref 0.76–1.27)
Calcium: 9.3 mg/dL (ref 8.6–10.2)
Chloride: 103 mmol/L (ref 96–106)
GFR, EST AFRICAN AMERICAN: 83 mL/min/{1.73_m2} (ref >59)
GFR, EST NON AFRICAN AMERICAN: 71 mL/min/{1.73_m2} (ref >59)
Glucose: 116 mg/dL — ABNORMAL HIGH (ref 65–99)
POTASSIUM: 4 mmol/L (ref 3.5–5.2)
SODIUM: 143 mmol/L (ref 134–144)

## 2016-12-05 LAB — LIPID PANEL
CHOLESTEROL TOTAL: 164 mg/dL (ref 100–199)
Chol/HDL Ratio: 3.8 ratio units (ref 0.0–5.0)
HDL: 43 mg/dL (ref >39)
LDL Calculated: 103 mg/dL — ABNORMAL HIGH (ref 0–99)
Triglycerides: 90 mg/dL (ref 0–149)
VLDL CHOLESTEROL CAL: 18 mg/dL (ref 5–40)

## 2016-12-05 LAB — TESTOSTERONE: TESTOSTERONE: 353 ng/dL (ref 264–916)

## 2016-12-05 LAB — PSA: Prostate Specific Ag, Serum: 1.9 ng/mL (ref 0.0–4.0)

## 2016-12-05 LAB — HEPATIC FUNCTION PANEL
ALBUMIN: 4.3 g/dL (ref 3.5–4.8)
ALK PHOS: 89 IU/L (ref 39–117)
ALT: 20 IU/L (ref 0–44)
AST: 21 IU/L (ref 0–40)
BILIRUBIN TOTAL: 0.6 mg/dL (ref 0.0–1.2)
BILIRUBIN, DIRECT: 0.17 mg/dL (ref 0.00–0.40)
TOTAL PROTEIN: 6.6 g/dL (ref 6.0–8.5)

## 2016-12-05 LAB — HEMOGLOBIN A1C
Est. average glucose Bld gHb Est-mCnc: 134 mg/dL
Hgb A1c MFr Bld: 6.3 % — ABNORMAL HIGH (ref 4.8–5.6)

## 2016-12-07 NOTE — Addendum Note (Signed)
Encounter addended by: Suzanne Boron on: 12/07/2016  2:24 PM<BR>    Actions taken: Visit Navigator Flowsheet section accepted

## 2016-12-09 NOTE — Progress Notes (Signed)
Pulmonary Individual Treatment Plan  Patient Details  Name: Miguel Morales MRN: [id number] Date of Birth: 07/29/1946 Referring Provider:   [place] from 09/01/2016 in [place]  Referring Provider  Dr. Chase Caller      Initial Encounter Date:  Flowsheet Row PULMONARY REHAB OTHER RESP ORIENTATION from 09/01/2016 in [place]  Date  09/01/16  Referring Provider  Dr. Chase Caller      Visit Diagnosis: SOB (shortness of breath)  Patient's Home Medications on Admission:   Current Outpatient Prescriptions:  .  acetaminophen (TYLENOL) 500 MG tablet, Take 1,000 mg by mouth every 6 (six) hours as needed. , Disp: , Rfl:  .  albuterol (PROVENTIL HFA;VENTOLIN HFA) 108 (90 Base) MCG/ACT inhaler, Inhale 2 puffs into the lungs every 6 (six) hours as needed for wheezing., Disp: 3 Inhaler, Rfl: 2 .  amLODipine (NORVASC) 10 MG tablet, TAKE 1 TABLET DAILY, Disp: 90 tablet, Rfl: 1 .  Ascorbic Acid (VITAMIN C) 1000 MG tablet, Take 1,000 mg by mouth daily., Disp: , Rfl:  .  aspirin 81 MG tablet, Take 81 mg by mouth daily., Disp: , Rfl:  .  atorvastatin (LIPITOR) 40 MG tablet, TAKE 1 TABLET DAILY (NEW DOSE), Disp: 90 tablet, Rfl: 0 .  budesonide-formoterol (SYMBICORT) 160-4.5 MCG/ACT inhaler, Inhale 2 puffs into the lungs 2 (two) times daily., Disp: , Rfl:  .  Coenzyme Q10 (COQ10) 400 MG CAPS, Take 1 capsule by mouth daily. , Disp: , Rfl:  .  Flaxseed, Linseed, (FLAXSEED OIL) 1000 MG CAPS, Take 1 capsule by mouth daily., Disp: , Rfl:  .  fluticasone (FLONASE) 50 MCG/ACT nasal spray, USE 1 SPRAY IN EACH NOSTRIL DAILY AS NEEDED, Disp: 16 g, Rfl: 4 .  hydrOXYzine (ATARAX/VISTARIL) 10 MG tablet, Take 10 mg by mouth at bedtime as needed., Disp: , Rfl:  .  ibuprofen (ADVIL,MOTRIN) 200 MG tablet, Take 800 mg by mouth 2 (two) times daily as needed. , Disp: , Rfl:  .  ketotifen (ZADITOR) 0.025 % ophthalmic solution, Place 1 drop  into both eyes 2 (two) times daily as needed., Disp: , Rfl:  .  lidocaine (XYLOCAINE) 5 % ointment, Apply 1 application topically as needed for mild pain (For Back and hip). , Disp: , Rfl:  .  loratadine (CLARITIN) 10 MG tablet, Take 10 mg by mouth daily as needed for allergies., Disp: , Rfl:  .  Magnesium 250 MG TABS, Take 1 tablet by mouth daily., Disp: , Rfl:  .  mometasone (ELOCON) 0.1 % lotion, APPLY TOPICALLY DAILY, Disp: 60 mL, Rfl: 3 .  omeprazole (PRILOSEC) 20 MG capsule, TAKE 1 CAPSULE DAILY AS NEEDED, Disp: 90 capsule, Rfl: 1 .  SPIRIVA HANDIHALER 18 MCG inhalation capsule, INHALE THE CONTENTS OF 1 CAPSULE DAILY, Disp: 90 capsule, Rfl: 1 .  tamsulosin (FLOMAX) 0.4 MG CAPS capsule, Take 1 tablet by mouth at bedtime., Disp: 90 capsule, Rfl: 1 .  triamcinolone (KENALOG) 0.025 % cream, Apply 1 application topically 2 (two) times daily. Reported on 05/14/2016, Disp: , Rfl:  .  Turmeric Curcumin 500 MG CAPS, Take 1 capsule by mouth daily., Disp: , Rfl:   Past Medical History: Past Medical History:  Diagnosis Date  . Arthritis   . Asthma   . Cancer ([place])    skin  . COPD (chronic obstructive pulmonary disease) ([place])   . GERD (gastroesophageal reflux disease)   . History of bronchitis   . Hypertension   . Mixed hyperlipidemia   .  Nocturia   . Shortness of breath dyspnea    with exertion  . Sleep apnea    will sometimes wear a CPAP  . Spinal stenosis     Tobacco Use: History  Smoking Status  . Former Smoker  . Packs/day: 1.00  . Years: 30.00  . Types: Cigarettes  . Start date: 11/09/1958  . Quit date: 11/10/2003  Smokeless Tobacco  . Never Used    Labs: Recent Review Flowsheet Data    Labs for ITP Cardiac and Pulmonary Rehab Latest Ref Rng & Units 04/03/2016 04/09/2016 06/16/2016 06/19/2016 12/04/2016   Cholestrol 100 - 199 mg/dL - - 166 - 164   LDLCALC 0 - 99 mg/dL - - 101(H) - 103(H)   HDL >39 mg/dL - - 37(L) - 43   Trlycerides 0 - 149 mg/dL - - 138 - 90   Hemoglobin A1c 4.8  - 5.6 % - - - 5.7 6.3(H)   PHART 7.350 - 7.450 7.464(H) 7.428 - - -   PCO2ART 35.0 - 45.0 mmHg 34.8(L) 35.2 - - -   HCO3 20.0 - 24.0 mEq/L 24.7(H) 23.0 - - -   TCO2 0 - 100 mmol/L 25.7 24.1 - - -   ACIDBASEDEF 0.0 - 2.0 mmol/L - 0.9 - - -   O2SAT % 96.3 92.5 - - -      Capillary Blood Glucose: Lab Results  Component Value Date   GLUCAP 137 (H) 04/10/2016   GLUCAP 231 (H) 04/09/2016   GLUCAP 150 (H) 04/09/2016   GLUCAP 168 (H) 04/09/2016   GLUCAP 113 (H) 04/09/2016     ADL UCSD:     Pulmonary Assessment Scores    Row Name 09/01/16 1108 12/09/16 1314       ADL UCSD   ADL Phase Entry Exit    SOB Score total 68 55    Rest 0 0    Walk 6 7    Stairs 4 3    Bath 3 3    Dress 3 3    Shop 3 2      CAT Score   CAT Score 21 21      mMRC Score   mMRC Score 3 3       Pulmonary Function Assessment:     Pulmonary Function Assessment - 09/01/16 1105      Pulmonary Function Tests   FVC% 2.93 %   FEV1% 1.96 %   FEV1/FVC Ratio 67   RV% 2.89 %   DLCO% 72 %     Initial Spirometry Results   FVC% 2.93 %   FEV1% 1.96 %   FEV1/FVC Ratio 67     Post Bronchodilator Spirometry Results   FVC% 2.9 %   FEV1% 2.04 %   FEV1/FVC Ratio 70     Breath   Shortness of Breath Limiting activity      Exercise Target Goals:    Exercise Program Goal: Individual exercise prescription set with THRR, safety & activity barriers. Participant demonstrates ability to understand and report RPE using BORG scale, to self-measure pulse accurately, and to acknowledge the importance of the exercise prescription.  Exercise Prescription Goal: Starting with aerobic activity 30 plus minutes a day, 3 days per week for initial exercise prescription. Provide home exercise prescription and guidelines that participant acknowledges understanding prior to discharge.  Activity Barriers & Risk Stratification:     Activity Barriers & Cardiac Risk Stratification - 09/01/16 1102      Activity Barriers  & Cardiac Risk Stratification  Activity Barriers Joint Problems  (L) hip joint   Cardiac Risk Stratification Low      6 Minute Walk:     6 Minute Walk    Row Name 09/01/16 1044 12/07/16 1422       6 Minute Walk   Phase Initial Discharge    Distance 1400 feet 1500 feet    Distance % Change  - 7.14 %    Walk Time 6 minutes 6 minutes    # of Rest Breaks 0 0    MPH 2.65 2.84    METS 3.03 3.17    RPE 12 14    Perceived Dyspnea  11 15    VO2 Peak 8.89 11.43    Symptoms No No    Resting HR 85 bpm 97 bpm    Resting BP 120/62 126/66    Max Ex. HR 94 bpm 103 bpm    Max Ex. BP 136/70 154/66    2 Minute Post BP 118/62 128/66       Initial Exercise Prescription:     Initial Exercise Prescription - 09/01/16 1000      Date of Initial Exercise RX and Referring Provider   Date 09/01/16   Referring Provider Dr. Chase Caller     Treadmill   MPH 1.3   Grade 0   Minutes 15   METs 1.9     NuStep   Level 2   Watts 15   Minutes 20   METs 1.9     Prescription Details   Frequency (times per week) 3   Duration Progress to 30 minutes of continuous aerobic without signs/symptoms of physical distress     Intensity   THRR REST +  20   THRR 40-80% of Max Heartrate 110-123-135   Ratings of Perceived Exertion 11-13   Perceived Dyspnea 0-4     Progression   Progression Continue progressive overload as per policy without signs/symptoms or physical distress.     Resistance Training   Training Prescription Yes   Weight 1   Reps 10-12      Perform Capillary Blood Glucose checks as needed.  Exercise Prescription Changes:      Exercise Prescription Changes    Row Name 09/15/16 1200 10/15/16 1500 11/16/16 1200         Exercise Review   Progression  - Yes Yes       Response to Exercise   Blood Pressure (Admit) 142/68 120/70 110/62     Blood Pressure (Exercise) 150/70 136/72 130/70     Blood Pressure (Exit) 114/60 134/70 110/60     Heart Rate (Admit) 91 bpm 90 bpm 75  bpm     Heart Rate (Exercise) 95 bpm 97 bpm 84 bpm     Heart Rate (Exit) 86 bpm 89 bpm 87 bpm     Oxygen Saturation (Admit) 96 % 94 % 95 %     Oxygen Saturation (Exercise) 96 % 95 % 95 %     Oxygen Saturation (Exit) 96 % 95 % 97 %     Rating of Perceived Exertion (Exercise) '9 9 11     '$ Perceived Dyspnea (Exercise) '10 9 11     '$ Duration Progress to 30 minutes of continuous aerobic without signs/symptoms of physical distress Progress to 30 minutes of continuous aerobic without signs/symptoms of physical distress Progress to 30 minutes of continuous aerobic without signs/symptoms of physical distress     Intensity Rest + 20 Rest + 20 Rest + 20  Progression   Progression Continue progressive overload as per policy without signs/symptoms or physical distress. Continue progressive overload as per policy without signs/symptoms or physical distress. Continue progressive overload as per policy without signs/symptoms or physical distress.       Resistance Training   Training Prescription Yes Yes Yes     Weight '2 3 3     '$ Reps 10-12 10-12 10-12       Treadmill   MPH 1.5 1.8 2     Grade 0 0 0     Minutes '15 15 15     '$ METs 2.1 2.3 2.53       NuStep   Level '3 3 3     '$ Watts '13 20 21     '$ Minutes '20 20 20     '$ METs 3.65 3.65 3.66       Home Exercise Plan   Plans to continue exercise at [facility]     Frequency Add 2 additional days to program exercise sessions. Add 2 additional days to program exercise sessions. Add 2 additional days to program exercise sessions.        Exercise Comments:      Exercise Comments    Row Name 09/15/16 1234 10/15/16 1516 11/16/16 1237       Exercise Comments Patient is proggressing well. Patient is progressing appropriately  Patient is progressing well        Discharge Exercise Prescription (Final Exercise Prescription Changes):     Exercise Prescription Changes - 11/16/16 1200      Exercise Review   Progression Yes     Response to Exercise    Blood Pressure (Admit) 110/62   Blood Pressure (Exercise) 130/70   Blood Pressure (Exit) 110/60   Heart Rate (Admit) 75 bpm   Heart Rate (Exercise) 84 bpm   Heart Rate (Exit) 87 bpm   Oxygen Saturation (Admit) 95 %   Oxygen Saturation (Exercise) 95 %   Oxygen Saturation (Exit) 97 %   Rating of Perceived Exertion (Exercise) 11   Perceived Dyspnea (Exercise) 11   Duration Progress to 30 minutes of continuous aerobic without signs/symptoms of physical distress   Intensity Rest + 20     Progression   Progression Continue progressive overload as per policy without signs/symptoms or physical distress.     Resistance Training   Training Prescription Yes   Weight 3   Reps 10-12     Treadmill   MPH 2   Grade 0   Minutes 15   METs 2.53     NuStep   Level 3   Watts 21   Minutes 20   METs 3.66     Home Exercise Plan   Plans to continue exercise at Home   Frequency Add 2 additional days to program exercise sessions.      Nutrition:  Target Goals: Understanding of nutrition guidelines, daily intake of sodium '1500mg'$ , cholesterol '200mg'$ , calories 30% from fat and 7% or less from saturated fats, daily to have 5 or more servings of fruits and vegetables.  Biometrics:     Pre Biometrics - 09/01/16 1047      Pre Biometrics   Height '5\' 6"'$  (1.676 m)   Weight 212 lb 8.4 oz (96.4 kg)   Waist Circumference 45 inches   Hip Circumference 42 inches   Waist to Hip Ratio 1.07 %   BMI (Calculated) 34.4   Triceps Skinfold 16 mm   % Body Fat 33.1 %   Grip Strength  36.67 kg   Flexibility 11.67 in   Single Leg Stand 3 seconds         Post Biometrics - 12/07/16 1423       Post  Biometrics   Height '5\' 6"'$  (1.676 m)   Weight 213 lb 2.3 oz (96.7 kg)   Waist Circumference 47 inches   Hip Circumference 44.5 inches   Waist to Hip Ratio 1.06 %   BMI (Calculated) 34.5   Triceps Skinfold 16 mm   % Body Fat 34.1 %   Grip Strength 55 kg   Flexibility 10 in   Single Leg Stand 2  seconds      Nutrition Therapy Plan and Nutrition Goals:   Nutrition Discharge: Rate Your Plate Scores:     Nutrition Assessments - 12/09/16 1316      Rate Your Plate Scores   Pre Score 47   Pre Score % 47 %   Post Score 48   Post Score % 48 %   % Change 1 %      Psychosocial: Target Goals: Acknowledge presence or absence of depression, maximize coping skills, provide positive support system. Participant is able to verbalize types and ability to use techniques and skills needed for reducing stress and depression.  Initial Review & Psychosocial Screening:     Initial Psych Review & Screening - 09/01/16 1120      Initial Review   Current issues with --  No real concerns     Family Dynamics   Good Support System? Yes     Barriers   Psychosocial barriers to participate in program There are no identifiable barriers or psychosocial needs.  Patient QOL scores are 24.98.      Screening Interventions   Interventions Encouraged to exercise      Quality of Life Scores:     Quality of Life - 12/07/16 1424      Quality of Life Scores   Health/Function Pre 22.68 %   Health/Function Post 21.23 %   Health/Function % Change -6.39 %   Socioeconomic Pre 27.92 %   Socioeconomic Post 28.33 %   Socioeconomic % Change  1.47 %   Psych/Spiritual Pre 26.07 %   Psych/Spiritual Post 23.79 %   Psych/Spiritual % Change -8.75 %   Family Pre 26.4 %   Family Post 29.5 %   Family % Change 11.74 %   GLOBAL Pre 24.98 %   GLOBAL Post 24.32 %   GLOBAL % Change -2.64 %      PHQ-9: Recent Review Flowsheet Data    Depression screen [facility] 2/9 12/09/2016 09/01/2016 08/16/2014   Decreased Interest 0 0 0   Down, Depressed, Hopeless 0 0 0   PHQ - 2 Score 0 0 0   Altered sleeping 1 2 -   Tired, decreased energy 1 2 -   Change in appetite 1 1 -   Feeling bad or failure about yourself  0 0 -   Trouble concentrating 1 0 -   Moving slowly or fidgety/restless 0 0 -   Suicidal thoughts 0 0 -    PHQ-9 Score 4 5 -   Difficult doing work/chores Not difficult at all Somewhat difficult -      Psychosocial Evaluation and Intervention:     Psychosocial Evaluation - 12/09/16 1325      Discharge Psychosocial Assessment & Intervention   Comments Patient has no psychosocial barriers identified at discharge.       Psychosocial Re-Evaluation:  Psychosocial Re-Evaluation    Row Name 09/23/16 1427 10/21/16 1415 11/18/16 1314         Psychosocial Re-Evaluation   Interventions Encouraged to attend Pulmonary Rehabilitation for the exercise Encouraged to attend Pulmonary Rehabilitation for the exercise Encouraged to attend Pulmonary Rehabilitation for the exercise     Comments Patient continues to have no psychosocial issues identified.  Patient's QOL score was 24.98. He continues to have no psychosocial issues identified.  Patient's QOL score was WNL at 24.98. He continues to have no psychosocial issues identified.      Continued Psychosocial Services Needed No No No        Education: Education Goals: Education classes will be provided on a weekly basis, covering required topics. Participant will state understanding/return demonstration of topics presented.  Learning Barriers/Preferences:     Learning Barriers/Preferences - 09/01/16 1104      Learning Barriers/Preferences   Learning Barriers None   Learning Preferences Written Material;Pictoral;Video      Education Topics: How Lungs Work and Diseases: - Discuss the anatomy of the lungs and diseases that can affect the lungs, such as COPD. Flowsheet Row PULMONARY REHAB OTHER RESPIRATORY from 12/03/2016 in [place]  Date  09/10/16  Educator  [place]  Instruction Review Code  2- meets goals/outcomes      Exercise: -Discuss the importance of exercise, FITT principles of exercise, normal and abnormal responses to exercise, and how to exercise safely. Flowsheet Row PULMONARY REHAB OTHER RESPIRATORY  from 12/03/2016 in [place]  Date  09/17/16  Educator  [place]  Instruction Review Code  2- meets goals/outcomes      Environmental Irritants: -Discuss types of environmental irritants and how to limit exposure to environmental irritants. Flowsheet Row PULMONARY REHAB OTHER RESPIRATORY from 12/03/2016 in [place]  Date  09/24/16  Educator  Russella Dar  Instruction Review Code  2- meets goals/outcomes      Meds/Inhalers and oxygen: - Discuss respiratory medications, definition of an inhaler and oxygen, and the proper way to use an inhaler and oxygen.   Energy Saving Techniques: - Discuss methods to conserve energy and decrease shortness of breath when performing activities of daily living.  Flowsheet Row PULMONARY REHAB OTHER RESPIRATORY from 12/03/2016 in [place]  Date  10/08/16  Educator  Nils Flack  Instruction Review Code  2- meets goals/outcomes      Bronchial Hygiene / Breathing Techniques: - Discuss breathing mechanics, pursed-lip breathing technique,  proper posture, effective ways to clear airways, and other functional breathing techniques Flowsheet Row PULMONARY REHAB OTHER RESPIRATORY from 12/03/2016 in [place]  Date  10/15/16  Educator  Nils Flack  Instruction Review Code  2- meets goals/outcomes      Cleaning Equipment: - Provides group verbal and written instruction about the health risks of elevated stress, cause of high stress, and healthy ways to reduce stress. Flowsheet Row PULMONARY REHAB OTHER RESPIRATORY from 12/03/2016 in [place]  Date  10/22/16  Educator  Diane coad  Instruction Review Code  2- meets goals/outcomes      Nutrition I: Fats: - Discuss the types of cholesterol, what cholesterol does to the body, and how cholesterol levels can be controlled. Flowsheet Row PULMONARY REHAB OTHER RESPIRATORY from 12/03/2016 in [place]  Date  10/29/16  Educator  Suzanne Boron  Instruction Review Code  2- meets goals/outcomes      Nutrition II: Labels: -Discuss the different  components of food labels and how to read food labels. Flowsheet Row PULMONARY REHAB OTHER RESPIRATORY from 12/03/2016 in [place]  Date  11/05/16  Educator  Nils Flack  Instruction Review Code  2- meets goals/outcomes      Respiratory Infections: - Discuss the signs and symptoms of respiratory infections, ways to prevent respiratory infections, and the importance of seeking medical treatment when having a respiratory infection. Flowsheet Row PULMONARY REHAB OTHER RESPIRATORY from 12/03/2016 in [place]  Date  11/12/16  Educator  Lisabeth Register  Instruction Review Code  2- meets goals/outcomes      Stress I: Signs and Symptoms: - Discuss the causes of stress, how stress may lead to anxiety and depression, and ways to limit stress. Flowsheet Row PULMONARY REHAB OTHER RESPIRATORY from 12/03/2016 in [place]  Date  11/19/16  Educator  DJ  Instruction Review Code  2- meets goals/outcomes      Stress II: Relaxation: -Discuss relaxation techniques to limit stress.   Oxygen for Home/Travel: - Discuss how to prepare for travel when on oxygen and proper ways to transport and store oxygen to ensure safety. Flowsheet Row PULMONARY REHAB OTHER RESPIRATORY from 12/03/2016 in [place]  Date  09/03/16  Educator  Lisabeth Register  Instruction Review Code  2- meets goals/outcomes      Knowledge Questionnaire Score:     Knowledge Questionnaire Score - 12/09/16 1312      Knowledge Questionnaire Score   Pre Score 9/14   Post Score 7/14      Core Components/Risk Factors/Patient Goals at Admission:     Personal Goals and Risk Factors at Admission - 09/01/16 1117      Core Components/Risk Factors/Patient Goals on Admission     Weight Management Weight Loss   Increase Strength and Stamina Yes   Intervention Provide advice, education, support and counseling about physical activity/exercise needs.;Develop an individualized exercise prescription for aerobic and resistive training based on initial evaluation findings, risk stratification, comorbidities and participant's personal goals.   Expected Outcomes Achievement of increased cardiorespiratory fitness and enhanced flexibility, muscular endurance and strength shown through measurements of functional capacity and personal statement of participant.   Improve shortness of breath with ADL's Yes   Intervention Provide education, individualized exercise plan and daily activity instruction to help decrease symptoms of SOB with activities of daily living.   Expected Outcomes Short Term: Achieves a reduction of symptoms when performing activities of daily living.   Personal Goal Other Yes   Personal Goal Improve breathing, maintain good health   Intervention Attend PR 2 x week and supplement his exercise at home with 3 x week activity.    Expected Outcomes Reach personal goal.       Core Components/Risk Factors/Patient Goals Review:      Goals and Risk Factor Review    Row Name 09/01/16 1119 09/23/16 1426 10/21/16 1413 11/18/16 1312 12/09/16 1322     Core Components/Risk Factors/Patient Goals Review   Personal Goals Review Weight Management/Obesity;Increase Strength and Stamina;Improve shortness of breath with ADL's Weight Management/Obesity;Increase Strength and Stamina;Improve shortness of breath with ADL's Weight Management/Obesity;Increase Strength and Stamina;Improve shortness of breath with ADL's  Improve breathing; maintain good health. Weight Management/Obesity;Increase Strength and Stamina;Improve shortness of breath with ADL's  Improve breathing; maintain good health. Weight Management/Obesity;Improve shortness of breath with ADL's;Develop more efficient  breathing techniques such as purse lipped breathing and diaphragmatic breathing and practicing self-pacing with activity.;Increase Strength and Stamina  Breathe better.    Review  - Patient new to program. He has attended 6 sessions. Will continue to monitor for progress.  Patient has attended 12 sessions gaining 5.3 lbs since starting the program. He has progressed some with increased strength and stamina and improved SOB.  Patient has attended 20 sessions with increased strength and stamina. He is progress well in the program. He has gained 3.3 lbs since starting the program. His has some improvement in SOB.  Upon graduation, patient gained 5.3 lbs. His rate your plate score did not improve. Patient did increase his strength and stamina and his SOB did improve some. He says he has increased his activity level at home and plans to continue exercising.    Expected Outcomes  - Patient will complete program meeting his personal goals.  Patient will continue to attend sessions meeting his personal goals.  Patient will continue to attend sessions and complete the program meeting her personal goals.  Patient will continue to exercise with improved SOB and increased strength and stamina. He will work toward healthier eating.       Core Components/Risk Factors/Patient Goals at Discharge (Final Review):      Goals and Risk Factor Review - 12/09/16 1322      Core Components/Risk Factors/Patient Goals Review   Personal Goals Review Weight Management/Obesity;Improve shortness of breath with ADL's;Develop more efficient breathing techniques such as purse lipped breathing and diaphragmatic breathing and practicing self-pacing with activity.;Increase Strength and Stamina  Breathe better.    Review Upon graduation, patient gained 5.3 lbs. His rate your plate score did not improve. Patient did increase his strength and stamina and his SOB did improve some. He says he has increased his activity level at home and plans  to continue exercising.    Expected Outcomes Patient will continue to exercise with improved SOB and increased strength and stamina. He will work toward healthier eating.       ITP Comments:     ITP Comments    Row Name 10/15/16 1256           ITP Comments Patient met with Registered Dietitian to discuss nutrition topics including: Heart healthty eating, heart health cooking and make smart choices when shopping; Portion control; weight management; and hydration. Patient attended a group session with the hospital chaplian called [facility] to discuss and share how his recent pulmomary diagnosis has effected his life.          Comments: Patient graduated from Pulmonary Rehabilitation today on 12/03/2016 after completing 24 sessions. He achieved LTG of 30 minutes of aerobic exercise at Max Met level of 3.66. All patients vitals are WNL. Patient has met with dietician. Discharge instruction has been reviewed in detail and patient stated an understanding of material given. Patient plans to continue exercising at home. Cardiac Rehab staff will make f/u calls at 1 month, 6 months, and 1 year. Patient had no complaints of any abnormal S/S or pain on their exit visit.

## 2016-12-09 NOTE — Progress Notes (Signed)
Discharge Summary  Patient Details  Name: Miguel Morales MRN: [id number] Date of Birth: [birth year] Referring Provider:   [facility] from 09/01/2016 in [place]  Referring Provider  Dr. Chase Caller       Number of Visits: 24  Reason for Discharge:  Patient reached a stable level of exercise. Patient independent in their exercise.  Smoking History:  History  Smoking Status  . Former Smoker  . Packs/day: 1.00  . Years: 30.00  . Types: Cigarettes  . Start date: 11/09/1958  . Quit date: 11/10/2003  Smokeless Tobacco  . Never Used    Diagnosis:  SOB (shortness of breath)  ADL UCSD:     Pulmonary Assessment Scores    Row Name 09/01/16 1108 12/09/16 1314       ADL UCSD   ADL Phase Entry Exit    SOB Score total 68 55    Rest 0 0    Walk 6 7    Stairs 4 3    Bath 3 3    Dress 3 3    Shop 3 2      CAT Score   CAT Score 21 21      mMRC Score   mMRC Score 3 3       Initial Exercise Prescription:     Initial Exercise Prescription - 09/01/16 1000      Date of Initial Exercise RX and Referring Provider   Date 09/01/16   Referring Provider Dr. Chase Caller     Treadmill   MPH 1.3   Grade 0   Minutes 15   METs 1.9     NuStep   Level 2   Watts 15   Minutes 20   METs 1.9     Prescription Details   Frequency (times per week) 3   Duration Progress to 30 minutes of continuous aerobic without signs/symptoms of physical distress     Intensity   THRR REST +  20   THRR 40-80% of Max Heartrate 110-123-135   Ratings of Perceived Exertion 11-13   Perceived Dyspnea 0-4     Progression   Progression Continue progressive overload as per policy without signs/symptoms or physical distress.     Resistance Training   Training Prescription Yes   Weight 1   Reps 10-12      Discharge Exercise Prescription (Final Exercise Prescription Changes):     Exercise Prescription Changes - 11/16/16 1200       Exercise Review   Progression Yes     Response to Exercise   Blood Pressure (Admit) 110/62   Blood Pressure (Exercise) 130/70   Blood Pressure (Exit) 110/60   Heart Rate (Admit) 75 bpm   Heart Rate (Exercise) 84 bpm   Heart Rate (Exit) 87 bpm   Oxygen Saturation (Admit) 95 %   Oxygen Saturation (Exercise) 95 %   Oxygen Saturation (Exit) 97 %   Rating of Perceived Exertion (Exercise) 11   Perceived Dyspnea (Exercise) 11   Duration Progress to 30 minutes of continuous aerobic without signs/symptoms of physical distress   Intensity Rest + 20     Progression   Progression Continue progressive overload as per policy without signs/symptoms or physical distress.     Resistance Training   Training Prescription Yes   Weight 3   Reps 10-12     Treadmill   MPH 2   Grade 0   Minutes 15   METs 2.53  NuStep   Level 3   Watts 21   Minutes 20   METs 3.66     Home Exercise Plan   Plans to continue exercise at Home   Frequency Add 2 additional days to program exercise sessions.      Functional Capacity:     6 Minute Walk    Row Name 09/01/16 1044 12/07/16 1422       6 Minute Walk   Phase Initial Discharge    Distance 1400 feet 1500 feet    Distance % Change  - 7.14 %    Walk Time 6 minutes 6 minutes    # of Rest Breaks 0 0    MPH 2.65 2.84    METS 3.03 3.17    RPE 12 14    Perceived Dyspnea  11 15    VO2 Peak 8.89 11.43    Symptoms No No    Resting HR 85 bpm 97 bpm    Resting BP 120/62 126/66    Max Ex. HR 94 bpm 103 bpm    Max Ex. BP 136/70 154/66    2 Minute Post BP 118/62 128/66       Psychological, QOL, Others - Outcomes: PHQ 2/9: Depression screen [facility] 2/9 12/09/2016 09/01/2016 08/16/2014  Decreased Interest 0 0 0  Down, Depressed, Hopeless 0 0 0  PHQ - 2 Score 0 0 0  Altered sleeping 1 2 -  Tired, decreased energy 1 2 -  Change in appetite 1 1 -  Feeling bad or failure about yourself  0 0 -  Trouble concentrating 1 0 -  Moving slowly or  fidgety/restless 0 0 -  Suicidal thoughts 0 0 -  PHQ-9 Score 4 5 -  Difficult doing work/chores Not difficult at all Somewhat difficult -    Quality of Life:     Quality of Life - 12/07/16 1424      Quality of Life Scores   Health/Function Pre 22.68 %   Health/Function Post 21.23 %   Health/Function % Change -6.39 %   Socioeconomic Pre 27.92 %   Socioeconomic Post 28.33 %   Socioeconomic % Change  1.47 %   Psych/Spiritual Pre 26.07 %   Psych/Spiritual Post 23.79 %   Psych/Spiritual % Change -8.75 %   Family Pre 26.4 %   Family Post 29.5 %   Family % Change 11.74 %   GLOBAL Pre 24.98 %   GLOBAL Post 24.32 %   GLOBAL % Change -2.64 %      Personal Goals: Goals established at orientation with interventions provided to work toward goal.     Personal Goals and Risk Factors at Admission - 09/01/16 1117      Core Components/Risk Factors/Patient Goals on Admission    Weight Management Weight Loss   Increase Strength and Stamina Yes   Intervention Provide advice, education, support and counseling about physical activity/exercise needs.;Develop an individualized exercise prescription for aerobic and resistive training based on initial evaluation findings, risk stratification, comorbidities and participant's personal goals.   Expected Outcomes Achievement of increased cardiorespiratory fitness and enhanced flexibility, muscular endurance and strength shown through measurements of functional capacity and personal statement of participant.   Improve shortness of breath with ADL's Yes   Intervention Provide education, individualized exercise plan and daily activity instruction to help decrease symptoms of SOB with activities of daily living.   Expected Outcomes Short Term: Achieves a reduction of symptoms when performing activities of daily living.   Personal Goal  Other Yes   Personal Goal Improve breathing, maintain good health   Intervention Attend PR 2 x week and supplement his  exercise at home with 3 x week activity.    Expected Outcomes Reach personal goal.        Personal Goals Discharge:     Goals and Risk Factor Review    Row Name 09/01/16 1119 09/23/16 1426 10/21/16 1413 11/18/16 1312 12/09/16 1322     Core Components/Risk Factors/Patient Goals Review   Personal Goals Review Weight Management/Obesity;Increase Strength and Stamina;Improve shortness of breath with ADL's Weight Management/Obesity;Increase Strength and Stamina;Improve shortness of breath with ADL's Weight Management/Obesity;Increase Strength and Stamina;Improve shortness of breath with ADL's  Improve breathing; maintain good health. Weight Management/Obesity;Increase Strength and Stamina;Improve shortness of breath with ADL's  Improve breathing; maintain good health. Weight Management/Obesity;Improve shortness of breath with ADL's;Develop more efficient breathing techniques such as purse lipped breathing and diaphragmatic breathing and practicing self-pacing with activity.;Increase Strength and Stamina  Breathe better.    Review  - Patient new to program. He has attended 6 sessions. Will continue to monitor for progress.  Patient has attended 12 sessions gaining 5.3 lbs since starting the program. He has progressed some with increased strength and stamina and improved SOB.  Patient has attended 20 sessions with increased strength and stamina. He is progress well in the program. He has gained 3.3 lbs since starting the program. His has some improvement in SOB.  Upon graduation, patient gained 5.3 lbs. His rate your plate score did not improve. Patient did increase his strength and stamina and his SOB did improve some. He says he has increased his activity level at home and plans to continue exercising.    Expected Outcomes  - Patient will complete program meeting his personal goals.  Patient will continue to attend sessions meeting his personal goals.  Patient will continue to attend sessions and complete  the program meeting her personal goals.  Patient will continue to exercise with improved SOB and increased strength and stamina. He will work toward healthier eating.       Nutrition & Weight - Outcomes:     Pre Biometrics - 09/01/16 1047      Pre Biometrics   Height '5\' 6"'$  (1.676 m)   Weight 212 lb 8.4 oz (96.4 kg)   Waist Circumference 45 inches   Hip Circumference 42 inches   Waist to Hip Ratio 1.07 %   BMI (Calculated) 34.4   Triceps Skinfold 16 mm   % Body Fat 33.1 %   Grip Strength 36.67 kg   Flexibility 11.67 in   Single Leg Stand 3 seconds         Post Biometrics - 12/07/16 1423       Post  Biometrics   Height '5\' 6"'$  (1.676 m)   Weight 213 lb 2.3 oz (96.7 kg)   Waist Circumference 47 inches   Hip Circumference 44.5 inches   Waist to Hip Ratio 1.06 %   BMI (Calculated) 34.5   Triceps Skinfold 16 mm   % Body Fat 34.1 %   Grip Strength 55 kg   Flexibility 10 in   Single Leg Stand 2 seconds      Nutrition:   Nutrition Discharge:     Nutrition Assessments - 12/09/16 1316      Rate Your Plate Scores   Pre Score 47   Pre Score % 47 %   Post Score 48   Post Score % 48 %   %  Change 1 %      Education Questionnaire Score:     Knowledge Questionnaire Score - 12/09/16 1312      Knowledge Questionnaire Score   Pre Score 9/14   Post Score 7/14      Goals reviewed with patient; copy given to patient.

## 2016-12-28 ENCOUNTER — Encounter: Payer: Self-pay | Admitting: Family Medicine

## 2016-12-28 ENCOUNTER — Ambulatory Visit (INDEPENDENT_AMBULATORY_CARE_PROVIDER_SITE_OTHER): Payer: Medicare Other | Admitting: Family Medicine

## 2016-12-28 VITALS — BP 120/70 | Ht 66.0 in | Wt 222.0 lb

## 2016-12-28 DIAGNOSIS — I10 Essential (primary) hypertension: Secondary | ICD-10-CM

## 2016-12-28 DIAGNOSIS — Z0000 Encounter for general adult medical examination without abnormal findings: Secondary | ICD-10-CM | POA: Diagnosis not present

## 2016-12-28 DIAGNOSIS — R7303 Prediabetes: Secondary | ICD-10-CM

## 2016-12-28 DIAGNOSIS — E785 Hyperlipidemia, unspecified: Secondary | ICD-10-CM | POA: Diagnosis not present
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
## 2016-12-28 NOTE — Patient Instructions (Signed)
Diabetes Mellitus and Food It is important for you to manage your blood sugar (glucose) level. Your blood glucose level can be greatly affected by what you eat. Eating healthier foods in the appropriate amounts throughout the day at about the same time each day will help you control your blood glucose level. It can also help slow or prevent worsening of your diabetes mellitus. Healthy eating may even help you improve the level of your blood pressure and reach or maintain a healthy weight. General recommendations for healthful eating and cooking habits include:  Eating meals and snacks regularly. Avoid going long periods of time without eating to lose weight.  Eating a diet that consists mainly of plant-based foods, such as fruits, vegetables, nuts, legumes, and whole grains.  Using low-heat cooking methods, such as baking, instead of high-heat cooking methods, such as deep frying.  Work with your dietitian to make sure you understand how to use the Nutrition Facts information on food labels. How can food affect me? Carbohydrates Carbohydrates affect your blood glucose level more than any other type of food. Your dietitian will help you determine how many carbohydrates to eat at each meal and teach you how to count carbohydrates. Counting carbohydrates is important to keep your blood glucose at a healthy level, especially if you are using insulin or taking certain medicines for diabetes mellitus. Alcohol Alcohol can cause sudden decreases in blood glucose (hypoglycemia), especially if you use insulin or take certain medicines for diabetes mellitus. Hypoglycemia can be a life-threatening condition. Symptoms of hypoglycemia (sleepiness, dizziness, and disorientation) are similar to symptoms of having too much alcohol. If your health care provider has given you approval to drink alcohol, do so in moderation and use the following guidelines:  Women should not have more than one drink per day, and men  should not have more than two drinks per day. One drink is equal to: ? 12 oz of beer. ? 5 oz of wine. ? 1 oz of hard liquor.  Do not drink on an empty stomach.  Keep yourself hydrated. Have water, diet soda, or unsweetened iced tea.  Regular soda, juice, and other mixers might contain a lot of carbohydrates and should be counted.  What foods are not recommended? As you make food choices, it is important to remember that all foods are not the same. Some foods have fewer nutrients per serving than other foods, even though they might have the same number of calories or carbohydrates. It is difficult to get your body what it needs when you eat foods with fewer nutrients. Examples of foods that you should avoid that are high in calories and carbohydrates but low in nutrients include:  Trans fats (most processed foods list trans fats on the Nutrition Facts label).  Regular soda.  Juice.  Candy.  Sweets, such as cake, pie, doughnuts, and cookies.  Fried foods.  What foods can I eat? Eat nutrient-rich foods, which will nourish your body and keep you healthy. The food you should eat also will depend on several factors, including:  The calories you need.  The medicines you take.  Your weight.  Your blood glucose level.  Your blood pressure level.  Your cholesterol level.  You should eat a variety of foods, including:  Protein. ? Lean cuts of meat. ? Proteins low in saturated fats, such as fish, egg whites, and beans. Avoid processed meats.  Fruits and vegetables. ? Fruits and vegetables that may help control blood glucose levels, such as apples,   mangoes, and yams.  Dairy products. ? Choose fat-free or low-fat dairy products, such as milk, yogurt, and cheese.  Grains, bread, pasta, and rice. ? Choose whole grain products, such as multigrain bread, whole oats, and brown rice. These foods may help control blood pressure.  Fats. ? Foods containing healthful fats, such as  nuts, avocado, olive oil, canola oil, and fish.  Does everyone with diabetes mellitus have the same meal plan? Because every person with diabetes mellitus is different, there is not one meal plan that works for everyone. It is very important that you meet with a dietitian who will help you create a meal plan that is just right for you. This information is not intended to replace advice given to you by your health care provider. Make sure you discuss any questions you have with your health care provider. Document Released: 07/23/2005 Document Revised: 04/02/2016 Document Reviewed: 09/22/2013 Elsevier Interactive Patient Education  2017 [employer] Inc.  

## 2017-03-19 DIAGNOSIS — M545 Low back pain, unspecified: Secondary | ICD-10-CM | POA: Insufficient documentation

## 2017-03-19 DIAGNOSIS — G8929 Other chronic pain: Secondary | ICD-10-CM | POA: Insufficient documentation

## 2017-03-19 DIAGNOSIS — M542 Cervicalgia: Secondary | ICD-10-CM | POA: Insufficient documentation

## 2017-03-23 DIAGNOSIS — J441 Chronic obstructive pulmonary disease with (acute) exacerbation: Secondary | ICD-10-CM | POA: Diagnosis not present

## 2017-03-23 DIAGNOSIS — R05 Cough: Secondary | ICD-10-CM | POA: Diagnosis not present

## 2017-04-27 ENCOUNTER — Encounter: Payer: Medicare Other | Admitting: Thoracic Surgery (Cardiothoracic Vascular Surgery)

## 2017-05-10 ENCOUNTER — Telehealth: Payer: Self-pay | Admitting: Family Medicine

## 2017-05-10 NOTE — Telephone Encounter (Signed)
This was completed thank you 

## 2017-05-10 NOTE — Telephone Encounter (Signed)
Pt dropped off a handicap placard form to be filled out. Form Is in nurse box.

## 2017-05-11 ENCOUNTER — Ambulatory Visit (INDEPENDENT_AMBULATORY_CARE_PROVIDER_SITE_OTHER): Payer: Medicare Other | Admitting: Thoracic Surgery (Cardiothoracic Vascular Surgery)

## 2017-05-11 ENCOUNTER — Encounter: Payer: Self-pay | Admitting: Thoracic Surgery (Cardiothoracic Vascular Surgery)

## 2017-05-11 VITALS — BP 119/81 | HR 83 | Resp 16 | Ht 66.0 in | Wt 215.6 lb

## 2017-05-11 DIAGNOSIS — C3492 Malignant neoplasm of unspecified part of left bronchus or lung: Secondary | ICD-10-CM | POA: Diagnosis not present

## 2017-05-11 DIAGNOSIS — R911 Solitary pulmonary nodule: Secondary | ICD-10-CM | POA: Diagnosis not present

## 2017-05-11 NOTE — Progress Notes (Signed)
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 He did well postoperatively and did not have any significant complications. I last saw him in the office in August 2017. He was doing well at that time.  He is being followed by Dr. Julien Nordmann. He did not require any adjuvant therapy.  Past Medical History:  Diagnosis Date  . Arthritis   . Asthma   . Cancer ([place])    skin  . COPD (chronic obstructive pulmonary disease) ([place])   . GERD (gastroesophageal reflux disease)   . History of bronchitis   . Hypertension   . Mixed hyperlipidemia   . Nocturia   . Shortness of breath dyspnea    with exertion  . Sleep apnea    will sometimes wear a CPAP  . Spinal stenosis     Current Outpatient Prescriptions  Medication Sig Dispense Refill  . acetaminophen (TYLENOL) 500 MG tablet Take 1,000 mg by mouth every 6 (six) hours as needed.     Marland Kitchen albuterol (PROVENTIL HFA;VENTOLIN HFA) 108 (90 Base) MCG/ACT inhaler Inhale 2 puffs into the lungs every 6 (six) hours as needed for wheezing. 3 Inhaler 2  . amLODipine (NORVASC) 10 MG tablet TAKE 1 TABLET DAILY 90 tablet 1  . Ascorbic Acid (VITAMIN C) 1000 MG tablet Take 1,000 mg by mouth daily.    Marland Kitchen aspirin 81 MG tablet Take 81 mg by mouth daily.    Marland Kitchen atorvastatin (LIPITOR) 40 MG tablet TAKE 1 TABLET DAILY (NEW DOSE) 90 tablet 0  . budesonide-formoterol (SYMBICORT) 160-4.5 MCG/ACT inhaler Inhale 2 puffs into the lungs 2 (two) times daily.    . Coenzyme Q10 (COQ10) 400 MG CAPS Take 1 capsule by mouth daily.     . Flaxseed, Linseed, (FLAXSEED OIL) 1000 MG CAPS Take 1 capsule by mouth daily.    . fluticasone (FLONASE) 50 MCG/ACT nasal spray USE 1 SPRAY IN EACH NOSTRIL DAILY AS NEEDED 16 g 4  . hydrOXYzine (ATARAX/VISTARIL) 10  MG tablet Take 10 mg by mouth at bedtime as needed.    Marland Kitchen ibuprofen (ADVIL,MOTRIN) 200 MG tablet Take 800 mg by mouth 2 (two) times daily as needed.     Marland Kitchen ketotifen (ZADITOR) 0.025 % ophthalmic solution Place 1 drop into both eyes 2 (two) times daily as needed.    . lidocaine (XYLOCAINE) 5 % ointment Apply 1 application topically as needed for mild pain (For Back and hip).     . loratadine (CLARITIN) 10 MG tablet Take 10 mg by mouth daily as needed for allergies.    . Magnesium 250 MG TABS Take 2 tablets by mouth daily.     . mometasone (ELOCON) 0.1 % lotion APPLY TOPICALLY DAILY 60 mL 3  . omeprazole (PRILOSEC) 20 MG capsule TAKE 1 CAPSULE DAILY AS NEEDED 90 capsule 1  . SPIRIVA HANDIHALER 18 MCG inhalation capsule INHALE THE CONTENTS OF 1 CAPSULE DAILY 90 capsule 1  . tamsulosin (FLOMAX) 0.4 MG CAPS capsule Take 1 tablet by mouth at bedtime. 90 capsule 1  . triamcinolone (KENALOG) 0.025 % cream Apply 1 application topically 2 (two) times daily. Reported on 05/14/2016    . Turmeric Curcumin 500 MG CAPS Take  1 capsule by mouth daily.     No current facility-administered medications for this visit.     Physical Exam BP 119/81 (BP Location: Right Arm, Patient Position: Sitting, Cuff Size: Large)   Pulse 83   Resp 16   Ht 5\' 6"  (1.676 m)   Wt 215 lb 9.6 oz (97.8 kg)   SpO2 97% Comment: RA  BMI 34.39 kg/m  71 year old man in no acute distress Alert and oriented 3 with no focal deficits No cervical or supraclavicular adenopathy Cardiac regular rate and rhythm normal S1 and S2 Lungs clear with equal breath sounds bilaterally  Diagnostic Tests: No studies today  Impression: Miguel Morales is a 71 year old gentleman who is now little over a year out from a thoracoscopic lingular segmentectomy for a stage IA adenocarcinoma. He is doing well with no residual pain. He has not had any respiratory issues.  He is scheduled to have a CT and see Dr. Julien Nordmann next week.  At this point I will let him  to his follow-up with Dr. Julien Nordmann. I am always available if I can be of any assistance with his care.    Melrose Nakayama, MD [facility] [phone]

## 2017-05-18 ENCOUNTER — Ambulatory Visit (HOSPITAL_COMMUNITY)
Admission: RE | Admit: 2017-05-18 | Discharge: 2017-05-18 | Disposition: A | Discharge status: Home / Self Care | Payer: Medicare Other | Source: Ambulatory Visit | Attending: Internal Medicine | Admitting: Internal Medicine

## 2017-05-18 ENCOUNTER — Other Ambulatory Visit (HOSPITAL_BASED_OUTPATIENT_CLINIC_OR_DEPARTMENT_OTHER): Payer: Medicare Other

## 2017-05-18 ENCOUNTER — Encounter (HOSPITAL_COMMUNITY): Payer: Self-pay

## 2017-05-18 DIAGNOSIS — C3492 Malignant neoplasm of unspecified part of left bronchus or lung: Secondary | ICD-10-CM | POA: Diagnosis not present

## 2017-05-18 DIAGNOSIS — M48062 Spinal stenosis, lumbar region with neurogenic claudication: Secondary | ICD-10-CM

## 2017-05-18 DIAGNOSIS — R0602 Shortness of breath: Secondary | ICD-10-CM | POA: Diagnosis not present

## 2017-05-18 DIAGNOSIS — Z85118 Personal history of other malignant neoplasm of bronchus and lung: Secondary | ICD-10-CM

## 2017-05-18 DIAGNOSIS — C3412 Malignant neoplasm of upper lobe, left bronchus or lung: Secondary | ICD-10-CM | POA: Diagnosis not present

## 2017-05-18 LAB — CBC WITH DIFFERENTIAL/PLATELET
BASO%: 1.1 % (ref 0.0–2.0)
BASOS ABS: 0.1 10*3/uL (ref 0.0–0.1)
EOS%: 3.7 % (ref 0.0–7.0)
Eosinophils Absolute: 0.3 10*3/uL (ref 0.0–0.5)
HCT: 43.3 % (ref 38.4–49.9)
HEMOGLOBIN: 14.7 g/dL (ref 13.0–17.1)
LYMPH%: 19.4 % (ref 14.0–49.0)
MCH: 31.6 pg (ref 27.2–33.4)
MCHC: 33.9 g/dL (ref 32.0–36.0)
MCV: 93.2 fL (ref 79.3–98.0)
MONO#: 0.8 10*3/uL (ref 0.1–0.9)
MONO%: 10.1 % (ref 0.0–14.0)
NEUT%: 65.7 % (ref 39.0–75.0)
NEUTROS ABS: 5 10*3/uL (ref 1.5–6.5)
PLATELETS: 256 10*3/uL (ref 140–400)
RBC: 4.64 10*6/uL (ref 4.20–5.82)
RDW: 14.2 % (ref 11.0–14.6)
WBC: 7.6 10*3/uL (ref 4.0–10.3)
lymph#: 1.5 10*3/uL (ref 0.9–3.3)

## 2017-05-18 LAB — COMPREHENSIVE METABOLIC PANEL
ALBUMIN: 3.9 g/dL (ref 3.5–5.0)
ALK PHOS: 92 U/L (ref 40–150)
ALT: 20 U/L (ref 0–55)
ANION GAP: 8 meq/L (ref 3–11)
AST: 20 U/L (ref 5–34)
BILIRUBIN TOTAL: 0.72 mg/dL (ref 0.20–1.20)
BUN: 16.8 mg/dL (ref 7.0–26.0)
CALCIUM: 10.3 mg/dL (ref 8.4–10.4)
CO2: 27 mEq/L (ref 22–29)
Chloride: 105 mEq/L (ref 98–109)
Creatinine: 1.1 mg/dL (ref 0.7–1.3)
EGFR: 69 mL/min/{1.73_m2} — ABNORMAL LOW (ref >90)
GLUCOSE: 127 mg/dL (ref 70–140)
Potassium: 4 mEq/L (ref 3.5–5.1)
Sodium: 140 mEq/L (ref 136–145)
TOTAL PROTEIN: 7.2 g/dL (ref 6.4–8.3)

## 2017-05-18 MED ORDER — IOPAMIDOL (ISOVUE-300) INJECTION 61%
INTRAVENOUS | Status: AC
  Administered 2017-05-18: 75 mL via INTRAVENOUS
  Filled 2017-05-18: qty 75

## 2017-05-18 MED ORDER — IOPAMIDOL (ISOVUE-300) INJECTION 61%
75.0000 mL | Freq: Once | INTRAVENOUS | Status: AC | PRN
  Administered 2017-05-18: 75 mL via INTRAVENOUS

## 2017-05-25 ENCOUNTER — Ambulatory Visit: Payer: Medicare Other | Admitting: Internal Medicine

## 2017-06-01 ENCOUNTER — Telehealth: Payer: Self-pay | Admitting: Family Medicine

## 2017-06-01 ENCOUNTER — Telehealth: Payer: Self-pay | Admitting: Internal Medicine

## 2017-06-01 ENCOUNTER — Ambulatory Visit (HOSPITAL_BASED_OUTPATIENT_CLINIC_OR_DEPARTMENT_OTHER): Payer: Medicare Other | Admitting: Internal Medicine

## 2017-06-01 ENCOUNTER — Encounter: Payer: Self-pay | Admitting: Internal Medicine

## 2017-06-01 VITALS — BP 129/81 | HR 74 | Temp 98.0°F | Resp 18 | Ht 66.0 in | Wt 216.9 lb

## 2017-06-01 DIAGNOSIS — Z85118 Personal history of other malignant neoplasm of bronchus and lung: Secondary | ICD-10-CM

## 2017-06-01 DIAGNOSIS — E782 Mixed hyperlipidemia: Secondary | ICD-10-CM

## 2017-06-01 DIAGNOSIS — R739 Hyperglycemia, unspecified: Secondary | ICD-10-CM

## 2017-06-01 DIAGNOSIS — C3492 Malignant neoplasm of unspecified part of left bronchus or lung: Secondary | ICD-10-CM

## 2017-06-01 NOTE — Telephone Encounter (Signed)
Lipid profile, A1c-patient recently had lab work completed by his other specialists therefore these are the only test necessary at this time

## 2017-06-01 NOTE — Telephone Encounter (Signed)
Scheduled appt per 7/24 los - Gave patient AVS and calender per [facility] to contact patient with ct schedule.

## 2017-06-01 NOTE — Progress Notes (Signed)
[facility] Telephone:([phone]) [phone]   [phone]) [phone]  OFFICE PROGRESS NOTE  [name], MD [place] [address]  DIAGNOSIS: Stage IA (T1a, N0, M0) non-small cell lung cancer, adenocarcinoma with negative EGFR, ALK, ROS 1 and BRAF mutations diagnosed in May 2017. PDL 1 expression is 10%  PRIOR THERAPY: Status post left upper lobe lingular segmentectomy with lymph node dissection under the care of Dr. Roxan Hockey on 04/08/2016.  CURRENT THERAPY: Observation.  INTERVAL HISTORY: Miguel Morales 71 y.o. male returns to the clinic today for follow-up visit. The patient is feeling fine today with no specific complaints. He denied having any chest pain, shortness of breath, cough or hemoptysis. He denied having any fever or chills. He has no nausea, vomiting, diarrhea or constipation. The patient has no weight loss or night sweats. He is currently on observation and feeling well. He had repeat CT scan of the chest performed recently and he is here for evaluation and discussion of his scan results.   MEDICAL HISTORY: Past Medical History:  Diagnosis Date  . Arthritis   . Asthma   . Cancer ([place])    skin  . COPD (chronic obstructive pulmonary disease) ([place])   . GERD (gastroesophageal reflux disease)   . History of bronchitis   . Hypertension   . Mixed hyperlipidemia   . Nocturia   . Shortness of breath dyspnea    with exertion  . Sleep apnea    will sometimes wear a CPAP  . Spinal stenosis     ALLERGIES:  is allergic to adhesive [tape] and sulfa antibiotics.  MEDICATIONS:  Current Outpatient Prescriptions  Medication Sig Dispense Refill  . acetaminophen (TYLENOL) 500 MG tablet Take 1,000 mg by mouth every 6 (six) hours as needed.     Marland Kitchen albuterol (PROVENTIL HFA;VENTOLIN HFA) 108 (90 Base) MCG/ACT inhaler Inhale 2 puffs into the lungs every 6 (six) hours as needed for wheezing. 3 Inhaler 2  . amLODipine (NORVASC) 10 MG tablet TAKE  1 TABLET DAILY 90 tablet 1  . Ascorbic Acid (VITAMIN C) 1000 MG tablet Take 1,000 mg by mouth daily.    Marland Kitchen aspirin 81 MG tablet Take 81 mg by mouth daily.    Marland Kitchen atorvastatin (LIPITOR) 40 MG tablet TAKE 1 TABLET DAILY (NEW DOSE) 90 tablet 0  . budesonide-formoterol (SYMBICORT) 160-4.5 MCG/ACT inhaler Inhale 2 puffs into the lungs 2 (two) times daily.    . Coenzyme Q10 (COQ10) 400 MG CAPS Take 1 capsule by mouth daily.     . Flaxseed, Linseed, (FLAXSEED OIL) 1000 MG CAPS Take 1 capsule by mouth daily.    . fluticasone (FLONASE) 50 MCG/ACT nasal spray USE 1 SPRAY IN EACH NOSTRIL DAILY AS NEEDED 16 g 4  . hydrOXYzine (ATARAX/VISTARIL) 10 MG tablet Take 10 mg by mouth at bedtime as needed.    Marland Kitchen ibuprofen (ADVIL,MOTRIN) 200 MG tablet Take 800 mg by mouth 2 (two) times daily as needed.     Marland Kitchen ketotifen (ZADITOR) 0.025 % ophthalmic solution Place 1 drop into both eyes 2 (two) times daily as needed.    . lidocaine (XYLOCAINE) 5 % ointment Apply 1 application topically as needed for mild pain (For Back and hip).     . loratadine (CLARITIN) 10 MG tablet Take 10 mg by mouth daily as needed for allergies.    . Magnesium 250 MG TABS Take 2 tablets by mouth daily.     . mometasone (ELOCON) 0.1 %  lotion APPLY TOPICALLY DAILY 60 mL 3  . omeprazole (PRILOSEC) 20 MG capsule TAKE 1 CAPSULE DAILY AS NEEDED 90 capsule 1  . SPIRIVA HANDIHALER 18 MCG inhalation capsule INHALE THE CONTENTS OF 1 CAPSULE DAILY 90 capsule 1  . tamsulosin (FLOMAX) 0.4 MG CAPS capsule Take 1 tablet by mouth at bedtime. 90 capsule 1  . triamcinolone (KENALOG) 0.025 % cream Apply 1 application topically 2 (two) times daily. Reported on 05/14/2016    . Turmeric Curcumin 500 MG CAPS Take 1 capsule by mouth daily.     No current facility-administered medications for this visit.     SURGICAL HISTORY:  Past Surgical History:  Procedure Laterality Date  . CIRCUMCISION    . COLONOSCOPY  03/04/2004   [id number] diverticula.  The remainder of  the colonic mucosa appeared normal/normal rectum  . COLONOSCOPY  05/22/2009   [id number] mucosa appeared normal/Left-sided diverticula/Normal rectum  . COLONOSCOPY N/A 08/02/2014   Procedure: COLONOSCOPY;  Surgeon: Daneil Dolin, MD;  [facility];  Service: Endoscopy;  Laterality: N/A;  2:00 PM  . KNEE ARTHROSCOPY Right 2004  . LYMPH NODE DISSECTION N/A 04/08/2016   Procedure: LYMPH NODE DISSECTION;  Surgeon: Melrose Nakayama, MD;  Location: [facility];  Service: Thoracic;  Laterality: N/A;  . SEPTOPLASTY    . VIDEO ASSISTED THORACOSCOPY (VATS)/WEDGE RESECTION Left 04/08/2016   Procedure: VIDEO ASSISTED THORACOSCOPY (VATS)/WEDGE RESECTION;  Surgeon: Melrose Nakayama, MD;  Location: [facility];  Service: Thoracic;  Laterality: Left;  Marland Kitchen VIDEO BRONCHOSCOPY WITH ENDOBRONCHIAL NAVIGATION N/A 02/26/2016   Procedure: VIDEO BRONCHOSCOPY WITH ENDOBRONCHIAL NAVIGATION LEFT UPPER LOBE LUNG NODULE;  Surgeon: [name], MD;  Location: [facility];  Service: Thoracic;  Laterality: N/A;    REVIEW OF SYSTEMS:  A comprehensive review of systems was negative.   PHYSICAL EXAMINATION: General appearance: alert, cooperative and no distress Head: Normocephalic, without obvious abnormality, atraumatic Neck: no adenopathy, no JVD, supple, symmetrical, trachea midline and thyroid not enlarged, symmetric, no tenderness/mass/nodules Lymph nodes: Cervical, supraclavicular, and axillary nodes normal. Resp: clear to auscultation bilaterally Back: symmetric, no curvature. ROM normal. No CVA tenderness. Cardio: regular rate and rhythm, S1, S2 normal, no murmur, click, rub or gallop GI: soft, non-tender; bowel sounds normal; no masses,  no organomegaly Extremities: extremities normal, atraumatic, no cyanosis or edema  ECOG PERFORMANCE STATUS: 1 - Symptomatic but completely ambulatory  Blood pressure 129/81, pulse 74, temperature 98 F (36.7 C), temperature source Oral, resp. rate 18, height _0  (1.676 m),  weight 216 lb 14.4 oz (98.4 kg), SpO2 98 %.  LABORATORY DATA: Lab Results  Component Value Date   WBC 7.6 05/18/2017   HGB 14.7 05/18/2017   HCT 43.3 05/18/2017   MCV 93.2 05/18/2017   PLT 256 05/18/2017      Chemistry      Component Value Date/Time   NA 140 05/18/2017 1056   K 4.0 05/18/2017 1056   CL 103 12/04/2016 0927   CO2 27 05/18/2017 1056   BUN 16.8 05/18/2017 1056   CREATININE 1.1 05/18/2017 1056      Component Value Date/Time   CALCIUM 10.3 05/18/2017 1056   ALKPHOS 92 05/18/2017 1056   AST 20 05/18/2017 1056   ALT 20 05/18/2017 1056   BILITOT 0.72 05/18/2017 1056       RADIOGRAPHIC STUDIES: Ct Chest W Contrast  Result Date: 05/18/2017 CLINICAL DATA:  Lung cancer with LEFT upper lobectomy. Intermittent cough. EXAM: CT CHEST WITH CONTRAST TECHNIQUE: Multidetector CT imaging of the  chest was performed during intravenous contrast administration. CONTRAST:  75 mL Isovue-300 COMPARISON:  11/11/2016 FINDINGS: Cardiovascular: Coronary artery calcification and aortic atherosclerotic calcification. Mediastinum/Nodes: No axillary supraclavicular adenopathy. No mediastinal hilar adenopathy. No pericardial fluid. Esophagus Lungs/Pleura: Linear/nodular thickening along the resection margin and LEFT oblique fissure is unchanged measuring 16 mm (image 72, series 7) compared to 18 mm on prior. No new nodularity. Airways normal. Upper Abdomen: Limited view of the liver, kidneys, pancreas are unremarkable. Normal adrenal glands. Musculoskeletal: No aggressive osseous lesion. IMPRESSION: 1. Stable linear nodular thickening along the resection margin in the lingula. 2. No new pulmonary nodules. Electronically Signed   By: Suzy Bouchard M.D.   On: 05/18/2017 16:02    ASSESSMENT AND PLAN: This is a very pleasant 71 years old white male with a stage IA non-small cell lung cancer status post left upper lobe lingular segmentectomy in May 2017. The patient is currently on observation. His  recent CT scan of the chest showed no evidence for disease recurrence. I recommended for the patient to continue on observation and he would have repeat CT scan of the chest in 6 months. He was advised to call immediately if he has any concerning symptoms in the interval. The patient voices understanding of current disease status and treatment options and is in agreement with the current care plan. All questions were answered. The patient knows to call the clinic with any problems, questions or concerns. We can certainly see the patient much sooner if necessary. I spent 10 minutes counseling the patient face to face. The total time spent in the appointment was 15 minutes.  Disclaimer: This note was dictated with voice recognition software. Similar sounding words can inadvertently be transcribed and may not be corrected upon review.

## 2017-06-01 NOTE — Telephone Encounter (Signed)
Patient is wanting to know if he needs labs done for 6 month follow up 8/20.

## 2017-06-02 NOTE — Telephone Encounter (Signed)
Spoke with patient and informed him per Dr.Scott Luking- we ordered labs. Patient verbalized understanding.

## 2017-06-15 DIAGNOSIS — R739 Hyperglycemia, unspecified: Secondary | ICD-10-CM | POA: Diagnosis not present

## 2017-06-15 DIAGNOSIS — E782 Mixed hyperlipidemia: Secondary | ICD-10-CM | POA: Diagnosis not present

## 2017-06-16 LAB — LIPID PANEL
Chol/HDL Ratio: 4.2 ratio (ref 0.0–5.0)
Cholesterol, Total: 161 mg/dL (ref 100–199)
HDL: 38 mg/dL — ABNORMAL LOW (ref >39)
LDL CALC: 101 mg/dL — AB (ref 0–99)
Triglycerides: 108 mg/dL (ref 0–149)
VLDL CHOLESTEROL CAL: 22 mg/dL (ref 5–40)

## 2017-06-16 LAB — HEMOGLOBIN A1C
ESTIMATED AVERAGE GLUCOSE: 137 mg/dL
HEMOGLOBIN A1C: 6.4 % — AB (ref 4.8–5.6)

## 2017-06-24 ENCOUNTER — Ambulatory Visit (INDEPENDENT_AMBULATORY_CARE_PROVIDER_SITE_OTHER): Payer: Medicare Other | Admitting: Family Medicine

## 2017-06-24 ENCOUNTER — Encounter: Payer: Self-pay | Admitting: Family Medicine

## 2017-06-24 VITALS — BP 116/74 | Ht 66.0 in | Wt 216.0 lb

## 2017-06-24 DIAGNOSIS — I10 Essential (primary) hypertension: Secondary | ICD-10-CM | POA: Diagnosis not present

## 2017-06-24 DIAGNOSIS — E782 Mixed hyperlipidemia: Secondary | ICD-10-CM | POA: Diagnosis not present

## 2017-06-24 DIAGNOSIS — R7303 Prediabetes: Secondary | ICD-10-CM | POA: Diagnosis not present

## 2017-06-24 MED ORDER — ATORVASTATIN CALCIUM 80 MG PO TABS: 80.0000 mg | ORAL_TABLET | Freq: Every day | ORAL | 2 refills | Status: DC

## 2017-06-24 NOTE — Progress Notes (Signed)
   Subjective:    Patient ID: Miguel Morales, male    DOB: 04/16/1946, 71 y.o.   MRN: [id number]  Hypertension  This is a chronic problem. The current episode started more than 1 year ago. Pertinent negatives include no chest pain.  Patient relates he takes his meds watch his diet stays active denies chest tightness pressure pain shortness breath or complications He states he eats healthy and exercises regularly. Patient does not smoke He had lung cancer surgery doing well from that No reoccurrence COPD doing well  Review of Systems  Constitutional: Negative for activity change, appetite change and fatigue.  HENT: Negative for congestion.   Respiratory: Negative for cough.   Cardiovascular: Negative for chest pain.  Gastrointestinal: Negative for abdominal pain.  Endocrine: Negative for polydipsia and polyphagia.  Neurological: Negative for weakness.  Psychiatric/Behavioral: Negative for confusion.       Objective:   Physical Exam  Constitutional: He appears well-nourished. No distress.  Cardiovascular: Normal rate, regular rhythm and normal heart sounds.   No murmur heard. Pulmonary/Chest: Effort normal and breath sounds normal. No respiratory distress.  Musculoskeletal: He exhibits no edema.  Lymphadenopathy:    He has no cervical adenopathy.  Neurological: He is alert.  Psychiatric: His behavior is normal.  Vitals reviewed.         Assessment & Plan:  COPD continue Spiriva follow-up pulmonology Lung cancer seemingly cured follow-up with oncology HTN good control Hyperlipidemia subpar control labs reviewed increase medication check labs again in 6 months

## 2017-06-24 NOTE — Patient Instructions (Signed)
Diabetes Mellitus and Food It is important for you to manage your blood sugar (glucose) level. Your blood glucose level can be greatly affected by what you eat. Eating healthier foods in the appropriate amounts throughout the day at about the same time each day will help you control your blood glucose level. It can also help slow or prevent worsening of your diabetes mellitus. Healthy eating may even help you improve the level of your blood pressure and reach or maintain a healthy weight. General recommendations for healthful eating and cooking habits include:  Eating meals and snacks regularly. Avoid going long periods of time without eating to lose weight.  Eating a diet that consists mainly of plant-based foods, such as fruits, vegetables, nuts, legumes, and whole grains.  Using low-heat cooking methods, such as baking, instead of high-heat cooking methods, such as deep frying.  Work with your dietitian to make sure you understand how to use the Nutrition Facts information on food labels. How can food affect me? Carbohydrates Carbohydrates affect your blood glucose level more than any other type of food. Your dietitian will help you determine how many carbohydrates to eat at each meal and teach you how to count carbohydrates. Counting carbohydrates is important to keep your blood glucose at a healthy level, especially if you are using insulin or taking certain medicines for diabetes mellitus. Alcohol Alcohol can cause sudden decreases in blood glucose (hypoglycemia), especially if you use insulin or take certain medicines for diabetes mellitus. Hypoglycemia can be a life-threatening condition. Symptoms of hypoglycemia (sleepiness, dizziness, and disorientation) are similar to symptoms of having too much alcohol. If your health care provider has given you approval to drink alcohol, do so in moderation and use the following guidelines:  Women should not have more than one drink per day, and men  should not have more than two drinks per day. One drink is equal to: ? 12 oz of beer. ? 5 oz of wine. ? 1 oz of hard liquor.  Do not drink on an empty stomach.  Keep yourself hydrated. Have water, diet soda, or unsweetened iced tea.  Regular soda, juice, and other mixers might contain a lot of carbohydrates and should be counted.  What foods are not recommended? As you make food choices, it is important to remember that all foods are not the same. Some foods have fewer nutrients per serving than other foods, even though they might have the same number of calories or carbohydrates. It is difficult to get your body what it needs when you eat foods with fewer nutrients. Examples of foods that you should avoid that are high in calories and carbohydrates but low in nutrients include:  Trans fats (most processed foods list trans fats on the Nutrition Facts label).  Regular soda.  Juice.  Candy.  Sweets, such as cake, pie, doughnuts, and cookies.  Fried foods.  What foods can I eat? Eat nutrient-rich foods, which will nourish your body and keep you healthy. The food you should eat also will depend on several factors, including:  The calories you need.  The medicines you take.  Your weight.  Your blood glucose level.  Your blood pressure level.  Your cholesterol level.  You should eat a variety of foods, including:  Protein. ? Lean cuts of meat. ? Proteins low in saturated fats, such as fish, egg whites, and beans. Avoid processed meats.  Fruits and vegetables. ? Fruits and vegetables that may help control blood glucose levels, such as apples,   mangoes, and yams.  Dairy products. ? Choose fat-free or low-fat dairy products, such as milk, yogurt, and cheese.  Grains, bread, pasta, and rice. ? Choose whole grain products, such as multigrain bread, whole oats, and brown rice. These foods may help control blood pressure.  Fats. ? Foods containing healthful fats, such as  nuts, avocado, olive oil, canola oil, and fish.  Does everyone with diabetes mellitus have the same meal plan? Because every person with diabetes mellitus is different, there is not one meal plan that works for everyone. It is very important that you meet with a dietitian who will help you create a meal plan that is just right for you. This information is not intended to replace advice given to you by your health care provider. Make sure you discuss any questions you have with your health care provider. Document Released: 07/23/2005 Document Revised: 04/02/2016 Document Reviewed: 09/22/2013 Elsevier Interactive Patient Education  2017 [employer] Inc.  

## 2017-06-28 ENCOUNTER — Ambulatory Visit: Payer: Medicare Other | Admitting: Family Medicine

## 2017-07-14 ENCOUNTER — Ambulatory Visit (INDEPENDENT_AMBULATORY_CARE_PROVIDER_SITE_OTHER): Payer: Medicare Other | Admitting: Adult Health

## 2017-07-14 ENCOUNTER — Ambulatory Visit: Payer: Medicare Other | Admitting: Internal Medicine

## 2017-07-14 ENCOUNTER — Encounter: Payer: Self-pay | Admitting: Adult Health

## 2017-07-14 DIAGNOSIS — J449 Chronic obstructive pulmonary disease, unspecified: Secondary | ICD-10-CM

## 2017-07-14 DIAGNOSIS — C3492 Malignant neoplasm of unspecified part of left bronchus or lung: Secondary | ICD-10-CM

## 2017-07-14 DIAGNOSIS — Z23 Encounter for immunization: Secondary | ICD-10-CM

## 2017-07-14 NOTE — Patient Instructions (Addendum)
Flu shot today .  Continue on Symbicort and Spiriva . Rinse after use.  Follow up Dr. Chase Caller in 6 months and As needed

## 2017-07-14 NOTE — Progress Notes (Signed)
@Patient  ID: Miguel Morales, male    DOB: 11-09-1946, 71 y.o.   MRN: [id number]  Chief Complaint  Patient presents with  . Follow-up    COPD    Referring provider: [name], MD  HPI: 71 year old male former smoker followed for moderate COPD. Left upper lobe stage I adenocarcinoma status post resection in summer of 2017  TEST   10/2016 Gold stage 2 COPD. His FEV1 is severely 2 L/74%. Note 1.5 L/55% 05/18/2017 CT chest showed stable linear nodular thickening along the resection margin in the lingula, no new pulmonary nodules  07/14/2017 Follow up : COPD /Lung Cancer  Patient presents for follow-up of COPD. Patient was last seen in December 2017. Patient says overall he is doing okay. Feels that his breathing is doing well with no flare cough or wheezing or shortness of breath. He remains on Symbicort and Spiriva. Tries to stay active with yard work . He is active with [employer] (was in [employer])   Patient has a known history of lung cancer with a stage I adenocarcinoma in the left upper lobe. He is status post resection summer of 2017. Most recent CT chest July 2018 showed no evidence of recurrence.   Allergies  Allergen Reactions  . Adhesive [Tape] Other (See Comments)    blisters  . Sulfa Antibiotics Hives    Immunization History  Administered Date(s) Administered  . Influenza, High Dose Seasonal PF 07/21/2016  . Influenza-Unspecified 07/25/2013, 08/07/2014, 07/23/2015  . Pneumococcal Conjugate-13 05/09/2014  . Pneumococcal Polysaccharide-23 11/10/2011  . Td 11/09/2010  . Zoster 12/14/2012    Past Medical History:  Diagnosis Date  . Arthritis   . Asthma   . Cancer ([place])    skin  . COPD (chronic obstructive pulmonary disease) ([place])   . GERD (gastroesophageal reflux disease)   . History of bronchitis   . Hypertension   . Mixed hyperlipidemia   . Nocturia   . Shortness of breath dyspnea    with exertion  . Sleep apnea    will sometimes wear a CPAP    . Spinal stenosis     Tobacco History: History  Smoking Status  . Former Smoker  . Packs/day: 1.00  . Years: 30.00  . Types: Cigarettes  . Start date: 11/09/1958  . Quit date: 11/10/2003  Smokeless Tobacco  . Never Used   Counseling given: Not Answered   Outpatient Encounter Prescriptions as of 07/14/2017  Medication Sig  . acetaminophen (TYLENOL) 500 MG tablet Take 1,000 mg by mouth every 6 (six) hours as needed.   Marland Kitchen albuterol (PROVENTIL HFA;VENTOLIN HFA) 108 (90 Base) MCG/ACT inhaler Inhale 2 puffs into the lungs every 6 (six) hours as needed for wheezing.  Marland Kitchen amLODipine (NORVASC) 10 MG tablet TAKE 1 TABLET DAILY  . Ascorbic Acid (VITAMIN C) 1000 MG tablet Take 1,000 mg by mouth daily.  Marland Kitchen aspirin 81 MG tablet Take 81 mg by mouth daily.  Marland Kitchen atorvastatin (LIPITOR) 80 MG tablet Take 1 tablet (80 mg total) by mouth daily at 6 PM.  . budesonide-formoterol (SYMBICORT) 160-4.5 MCG/ACT inhaler Inhale 2 puffs into the lungs 2 (two) times daily.  . Coenzyme Q10 (COQ10) 400 MG CAPS Take 1 capsule by mouth daily.   . Flaxseed, Linseed, (FLAXSEED OIL) 1000 MG CAPS Take 1 capsule by mouth daily.  . fluticasone (FLONASE) 50 MCG/ACT nasal spray USE 1 SPRAY IN EACH NOSTRIL DAILY AS NEEDED  . hydrOXYzine (ATARAX/VISTARIL) 10 MG tablet Take 10 mg by mouth at  bedtime as needed.  Marland Kitchen ibuprofen (ADVIL,MOTRIN) 200 MG tablet Take 800 mg by mouth 2 (two) times daily as needed.   Marland Kitchen ketotifen (ZADITOR) 0.025 % ophthalmic solution Place 1 drop into both eyes 2 (two) times daily as needed.  . lidocaine (XYLOCAINE) 5 % ointment Apply 1 application topically as needed for mild pain (For Back and hip).   . loratadine (CLARITIN) 10 MG tablet Take 10 mg by mouth daily as needed for allergies.  . Magnesium 250 MG TABS Take 2 tablets by mouth daily.   . mometasone (ELOCON) 0.1 % lotion APPLY TOPICALLY DAILY  . omeprazole (PRILOSEC) 20 MG capsule TAKE 1 CAPSULE DAILY AS NEEDED  . SPIRIVA HANDIHALER 18 MCG inhalation  capsule INHALE THE CONTENTS OF 1 CAPSULE DAILY  . tamsulosin (FLOMAX) 0.4 MG CAPS capsule Take 1 tablet by mouth at bedtime.  . triamcinolone (KENALOG) 0.025 % cream Apply 1 application topically 2 (two) times daily. Reported on 05/14/2016  . Turmeric Curcumin 500 MG CAPS Take 1 capsule by mouth daily.   No facility-administered encounter medications on file as of 07/14/2017.      Review of Systems  Constitutional:   No  weight loss, night sweats,  Fevers, chills, fatigue, or  lassitude.  HEENT:   No headaches,  Difficulty swallowing,  Tooth/dental problems, or  Sore throat,                No sneezing, itching, ear ache, nasal congestion, post nasal drip,   CV:  No chest pain,  Orthopnea, PND, swelling in lower extremities, anasarca, dizziness, palpitations, syncope.   GI  No heartburn, indigestion, abdominal pain, nausea, vomiting, diarrhea, change in bowel habits, loss of appetite, bloody stools.   Resp:   No coughing up of blood.  No change in color of mucus.  No wheezing.  No chest wall deformity  Skin: no rash or lesions.  GU: no dysuria, change in color of urine, no urgency or frequency.  No flank pain, no hematuria   MS:  No joint pain or swelling.  No decreased range of motion.  No back pain.    Physical Exam  BP 122/74 (BP Location: Left Arm, Cuff Size: Normal)   Pulse 80   Ht 5\' 6"  (1.676 m)   Wt 218 lb 3.2 oz (99 kg)   SpO2 96%   BMI 35.22 kg/m   GEN: A/Ox3; pleasant , NAD, well nourished    HEENT:  [place]/AT,  EACs-clear, TMs-wnl, NOSE-clear, THROAT-clear, no lesions, no postnasal drip or exudate noted.   NECK:  Supple w/ fair ROM; no JVD; normal carotid impulses w/o bruits; no thyromegaly or nodules palpated; no lymphadenopathy.    RESP  Clear  P & A; w/o, wheezes/ rales/ or rhonchi. no accessory muscle use, no dullness to percussion  CARD:  RRR, no m/r/g, tr  peripheral edema, pulses intact, no cyanosis or clubbing.  GI:   Soft & nt; nml bowel sounds; no  organomegaly or masses detected.   Musco: Warm bil, no deformities or joint swelling noted.   Neuro: alert, no focal deficits noted.    Skin: Warm, no lesions or rashes    Lab Results:  CBC  BMET  BNP No results found for: BNP  ProBNP No results found for: PROBNP  Imaging: No results found.   Assessment & Plan:   Moderate COPD (chronic obstructive pulmonary disease) ([facility]) Well controlled on current regimen  Plan  Patient Instructions  Flu shot today .  Continue on  Symbicort and Spiriva . Rinse after use.  Follow up Dr. Chase Caller in 6 months and As needed       Non-small cell carcinoma of left lung, stage 1 ([place]) Appears stable with recent CT chest no evidence of recurrence  Cont follow up with Oncology      Rexene Edison, NP 07/14/2017

## 2017-07-14 NOTE — Assessment & Plan Note (Signed)
Well controlled on current regimen  Plan  Patient Instructions  Flu shot today .  Continue on Symbicort and Spiriva . Rinse after use.  Follow up Dr. Chase Caller in 6 months and As needed

## 2017-07-14 NOTE — Assessment & Plan Note (Signed)
Appears stable with recent CT chest no evidence of recurrence  Cont follow up with Oncology

## 2017-07-23 ENCOUNTER — Telehealth: Payer: Self-pay | Admitting: Family Medicine

## 2017-07-23 NOTE — Telephone Encounter (Signed)
Pt dropped off a copy of his recent lab results. Results are in red folder in yellow folder in office.

## 2017-07-25 ENCOUNTER — Encounter: Payer: Self-pay | Admitting: Family Medicine

## 2017-07-25 NOTE — Telephone Encounter (Signed)
Letter sent to the patient recheck A1c in the spring

## 2017-10-14 ENCOUNTER — Other Ambulatory Visit: Payer: Self-pay

## 2017-11-04 ENCOUNTER — Telehealth: Payer: Self-pay | Admitting: Family Medicine

## 2017-11-04 DIAGNOSIS — E782 Mixed hyperlipidemia: Secondary | ICD-10-CM

## 2017-11-04 DIAGNOSIS — Z125 Encounter for screening for malignant neoplasm of prostate: Secondary | ICD-10-CM

## 2017-11-04 DIAGNOSIS — I10 Essential (primary) hypertension: Secondary | ICD-10-CM

## 2017-11-04 DIAGNOSIS — R739 Hyperglycemia, unspecified: Secondary | ICD-10-CM

## 2017-11-04 NOTE — Telephone Encounter (Signed)
Pt is requesting lab orders to be sent over. Pt would like to get these labs done in the morning if possible. Pt states that his cholesterol medication was recently changed. Please advise.

## 2017-11-04 NOTE — Telephone Encounter (Signed)
Last labs at [facility] 2018 lipid and bmp

## 2017-11-05 NOTE — Telephone Encounter (Signed)
Blood work ordered in Epic. Patient notified. 

## 2017-11-05 NOTE — Telephone Encounter (Signed)
Lipid, liver, PSA

## 2017-11-06 DIAGNOSIS — Z125 Encounter for screening for malignant neoplasm of prostate: Secondary | ICD-10-CM | POA: Diagnosis not present

## 2017-11-06 DIAGNOSIS — E782 Mixed hyperlipidemia: Secondary | ICD-10-CM | POA: Diagnosis not present

## 2017-11-06 DIAGNOSIS — I10 Essential (primary) hypertension: Secondary | ICD-10-CM | POA: Diagnosis not present

## 2017-11-07 LAB — HEPATIC FUNCTION PANEL
ALT: 25 IU/L (ref 0–44)
AST: 27 IU/L (ref 0–40)
Albumin: 4.4 g/dL (ref 3.5–4.8)
Alkaline Phosphatase: 96 IU/L (ref 39–117)
BILIRUBIN TOTAL: 0.5 mg/dL (ref 0.0–1.2)
Bilirubin, Direct: 0.16 mg/dL (ref 0.00–0.40)
Total Protein: 7 g/dL (ref 6.0–8.5)

## 2017-11-07 LAB — LIPID PANEL
CHOL/HDL RATIO: 4 ratio (ref 0.0–5.0)
Cholesterol, Total: 145 mg/dL (ref 100–199)
HDL: 36 mg/dL — AB (ref >39)
LDL CALC: 80 mg/dL (ref 0–99)
TRIGLYCERIDES: 144 mg/dL (ref 0–149)
VLDL Cholesterol Cal: 29 mg/dL (ref 5–40)

## 2017-11-07 LAB — PSA: PROSTATE SPECIFIC AG, SERUM: 1.4 ng/mL (ref 0.0–4.0)

## 2017-11-16 DIAGNOSIS — G4733 Obstructive sleep apnea (adult) (pediatric): Secondary | ICD-10-CM | POA: Diagnosis not present

## 2017-11-16 DIAGNOSIS — I10 Essential (primary) hypertension: Secondary | ICD-10-CM | POA: Diagnosis not present

## 2017-11-16 DIAGNOSIS — E782 Mixed hyperlipidemia: Secondary | ICD-10-CM | POA: Diagnosis not present

## 2017-11-16 DIAGNOSIS — G4701 Insomnia due to medical condition: Secondary | ICD-10-CM | POA: Diagnosis not present

## 2017-11-29 ENCOUNTER — Inpatient Hospital Stay: Payer: Medicare Other | Attending: Internal Medicine

## 2017-11-29 ENCOUNTER — Ambulatory Visit (HOSPITAL_COMMUNITY)
Admission: RE | Admit: 2017-11-29 | Discharge: 2017-11-29 | Disposition: A | Discharge status: Home / Self Care | Payer: Medicare Other | Source: Ambulatory Visit | Attending: Internal Medicine | Admitting: Internal Medicine

## 2017-11-29 ENCOUNTER — Encounter (HOSPITAL_COMMUNITY): Payer: Self-pay

## 2017-11-29 DIAGNOSIS — K219 Gastro-esophageal reflux disease without esophagitis: Secondary | ICD-10-CM | POA: Diagnosis not present

## 2017-11-29 DIAGNOSIS — J449 Chronic obstructive pulmonary disease, unspecified: Secondary | ICD-10-CM | POA: Diagnosis not present

## 2017-11-29 DIAGNOSIS — Z79899 Other long term (current) drug therapy: Secondary | ICD-10-CM | POA: Insufficient documentation

## 2017-11-29 DIAGNOSIS — J432 Centrilobular emphysema: Secondary | ICD-10-CM | POA: Diagnosis not present

## 2017-11-29 DIAGNOSIS — G4730 Sleep apnea, unspecified: Secondary | ICD-10-CM | POA: Insufficient documentation

## 2017-11-29 DIAGNOSIS — Z902 Acquired absence of lung [part of]: Secondary | ICD-10-CM | POA: Insufficient documentation

## 2017-11-29 DIAGNOSIS — E782 Mixed hyperlipidemia: Secondary | ICD-10-CM | POA: Diagnosis not present

## 2017-11-29 DIAGNOSIS — R918 Other nonspecific abnormal finding of lung field: Secondary | ICD-10-CM | POA: Diagnosis not present

## 2017-11-29 DIAGNOSIS — I700 Atherosclerosis of aorta: Secondary | ICD-10-CM | POA: Diagnosis not present

## 2017-11-29 DIAGNOSIS — I10 Essential (primary) hypertension: Secondary | ICD-10-CM | POA: Insufficient documentation

## 2017-11-29 DIAGNOSIS — Z7982 Long term (current) use of aspirin: Secondary | ICD-10-CM | POA: Diagnosis not present

## 2017-11-29 DIAGNOSIS — Z85118 Personal history of other malignant neoplasm of bronchus and lung: Secondary | ICD-10-CM | POA: Diagnosis not present

## 2017-11-29 DIAGNOSIS — C3412 Malignant neoplasm of upper lobe, left bronchus or lung: Secondary | ICD-10-CM | POA: Diagnosis not present

## 2017-11-29 DIAGNOSIS — C3492 Malignant neoplasm of unspecified part of left bronchus or lung: Secondary | ICD-10-CM

## 2017-11-29 LAB — CBC WITH DIFFERENTIAL/PLATELET
Basophils Absolute: 0 10*3/uL (ref 0.0–0.1)
Basophils Relative: 0 %
Eosinophils Absolute: 0.3 10*3/uL (ref 0.0–0.5)
Eosinophils Relative: 3 %
HEMATOCRIT: 40 % (ref 38.4–49.9)
Hemoglobin: 13.4 g/dL (ref 13.0–17.1)
LYMPHS PCT: 16 %
Lymphs Abs: 1.3 10*3/uL (ref 0.9–3.3)
MCH: 31.3 pg (ref 27.2–33.4)
MCHC: 33.5 g/dL (ref 32.0–36.0)
MCV: 93.5 fL (ref 79.3–98.0)
MONO ABS: 0.5 10*3/uL (ref 0.1–0.9)
MONOS PCT: 6 %
NEUTROS ABS: 6.2 10*3/uL (ref 1.5–6.5)
Neutrophils Relative %: 75 %
Platelets: 236 10*3/uL (ref 140–400)
RBC: 4.28 MIL/uL (ref 4.20–5.82)
RDW: 13.6 % (ref 11.0–15.6)
WBC: 8.4 10*3/uL (ref 4.0–10.3)

## 2017-11-29 LAB — COMPREHENSIVE METABOLIC PANEL
ALBUMIN: 3.8 g/dL (ref 3.5–5.0)
ALK PHOS: 93 U/L (ref 40–150)
ALT: 25 U/L (ref 0–55)
ANION GAP: 10 (ref 3–11)
AST: 24 U/L (ref 5–34)
BUN: 16 mg/dL (ref 7–26)
CALCIUM: 9.3 mg/dL (ref 8.4–10.4)
CO2: 26 mmol/L (ref 22–29)
Chloride: 104 mmol/L (ref 98–109)
Creatinine, Ser: 1.22 mg/dL (ref 0.70–1.30)
GFR calc Af Amer: >60 mL/min (ref >60)
GFR calc non Af Amer: 57 mL/min — ABNORMAL LOW (ref >60)
GLUCOSE: 225 mg/dL — AB (ref 70–140)
POTASSIUM: 3.8 mmol/L (ref 3.5–5.1)
SODIUM: 140 mmol/L (ref 136–145)
Total Bilirubin: 0.8 mg/dL (ref 0.2–1.2)
Total Protein: 6.9 g/dL (ref 6.4–8.3)

## 2017-11-29 MED ORDER — IOPAMIDOL (ISOVUE-300) INJECTION 61%
75.0000 mL | Freq: Once | INTRAVENOUS | Status: AC | PRN
  Administered 2017-11-29: 75 mL via INTRAVENOUS

## 2017-11-29 MED ORDER — IOPAMIDOL (ISOVUE-300) INJECTION 61%
INTRAVENOUS | Status: AC
  Filled 2017-11-29: qty 75

## 2017-11-30 ENCOUNTER — Ambulatory Visit (INDEPENDENT_AMBULATORY_CARE_PROVIDER_SITE_OTHER): Payer: Medicare Other | Admitting: Family Medicine

## 2017-11-30 VITALS — BP 132/82 | Temp 98.5°F | Ht 66.0 in | Wt 221.2 lb

## 2017-11-30 DIAGNOSIS — R7301 Impaired fasting glucose: Secondary | ICD-10-CM

## 2017-11-30 DIAGNOSIS — J019 Acute sinusitis, unspecified: Secondary | ICD-10-CM | POA: Diagnosis not present

## 2017-11-30 MED ORDER — AMOXICILLIN-POT CLAVULANATE 875-125 MG PO TABS: 1.0000 | ORAL_TABLET | Freq: Two times a day (BID) | ORAL | 0 refills | Status: DC

## 2017-11-30 NOTE — Progress Notes (Signed)
   Subjective:    Patient ID: Miguel Morales, male    DOB: 14-Jul-1946, 72 y.o.   MRN: [id number]  Cough  This is a new problem. The current episode started in the past 7 days. Associated symptoms include nasal congestion and a sore throat.   Significant cough congestion denies high fever chills sweats denies wheezing difficulty breathing does relate sinus pressure and discomfort Sees lung doctor tomorrow- had CT yesterday Having sinus sx  Review of Systems  HENT: Positive for sore throat.   Respiratory: Positive for cough.   Denies hemoptysis denies weight loss denies chest pressure tightness pain    Objective:   Physical Exam Eardrums are normal neck no masses lungs are clear no crackles heart regular mild sinus tenderness to percussion sinusitis        Assessment & Plan:  Antibiotics prescribed Warning signs discussed Follow-up if progressive troubles or worse  Patient was encouraged to follow through with seeing his lung doctor.  Preliminary CAT scan report shared with the patient but the patient was advised to talk with his specialist for full details and guidance  Also patient's glucose on recent lab work looked high we will check A1c when he follows up for wellness checkup he needs to check his glucose readings intermittently in between now and then for our review

## 2017-12-01 ENCOUNTER — Ambulatory Visit (HOSPITAL_COMMUNITY): Payer: Medicare Other

## 2017-12-02 ENCOUNTER — Inpatient Hospital Stay (HOSPITAL_BASED_OUTPATIENT_CLINIC_OR_DEPARTMENT_OTHER): Payer: Medicare Other | Admitting: Internal Medicine

## 2017-12-02 ENCOUNTER — Telehealth: Payer: Self-pay | Admitting: Internal Medicine

## 2017-12-02 ENCOUNTER — Encounter: Payer: Self-pay | Admitting: Internal Medicine

## 2017-12-02 DIAGNOSIS — K219 Gastro-esophageal reflux disease without esophagitis: Secondary | ICD-10-CM | POA: Diagnosis not present

## 2017-12-02 DIAGNOSIS — I10 Essential (primary) hypertension: Secondary | ICD-10-CM | POA: Diagnosis not present

## 2017-12-02 DIAGNOSIS — C3490 Malignant neoplasm of unspecified part of unspecified bronchus or lung: Secondary | ICD-10-CM

## 2017-12-02 DIAGNOSIS — Z85118 Personal history of other malignant neoplasm of bronchus and lung: Secondary | ICD-10-CM | POA: Diagnosis not present

## 2017-12-02 DIAGNOSIS — E782 Mixed hyperlipidemia: Secondary | ICD-10-CM | POA: Diagnosis not present

## 2017-12-02 DIAGNOSIS — Z902 Acquired absence of lung [part of]: Secondary | ICD-10-CM

## 2017-12-02 DIAGNOSIS — J449 Chronic obstructive pulmonary disease, unspecified: Secondary | ICD-10-CM | POA: Diagnosis not present

## 2017-12-02 NOTE — Progress Notes (Signed)
[facility] [phone]   [phone]) [phone]  OFFICE PROGRESS NOTE  [name], MD [place] [address]  DIAGNOSIS: Stage IA (T1a, N0, M0) non-small cell lung cancer, adenocarcinoma with negative EGFR, ALK, ROS 1 and BRAF mutations diagnosed in May 2017. PDL 1 expression is 10%  PRIOR THERAPY: Status post left upper lobe lingular segmentectomy with lymph node dissection under the care of Dr. Roxan Hockey on 04/08/2016.  CURRENT THERAPY: Observation.  INTERVAL HISTORY: Miguel Morales 72 y.o. male returns to the clinic today for follow-up visit.  The patient is feeling fine today with no specific complaints except for recent sinusitis and he is currently undergoing treatment with Augmentin by his primary care physician.  The patient denied having any current chest pain, shortness breath, cough or hemoptysis.  He denied having any recent weight loss or night sweats.  He has no nausea, vomiting, diarrhea or constipation.  He had repeat CT scan of the chest performed recently and he is here for evaluation and discuss results.   MEDICAL HISTORY: Past Medical History:  Diagnosis Date  . Arthritis   . Asthma   . Cancer ([place])    skin  . COPD (chronic obstructive pulmonary disease) ([place])   . GERD (gastroesophageal reflux disease)   . History of bronchitis   . Hypertension   . Mixed hyperlipidemia   . Nocturia   . Shortness of breath dyspnea    with exertion  . Sleep apnea    will sometimes wear a CPAP  . Spinal stenosis     ALLERGIES:  is allergic to adhesive [tape] and sulfa antibiotics.  MEDICATIONS:  Current Outpatient Medications  Medication Sig Dispense Refill  . acetaminophen (TYLENOL) 500 MG tablet Take 1,000 mg by mouth every 6 (six) hours as needed.     Marland Kitchen albuterol (PROVENTIL HFA;VENTOLIN HFA) 108 (90 Base) MCG/ACT inhaler Inhale 2 puffs into the lungs every 6 (six) hours as needed for wheezing. 3 Inhaler 2  .  amLODipine (NORVASC) 10 MG tablet TAKE 1 TABLET DAILY 90 tablet 1  . amoxicillin-clavulanate (AUGMENTIN) 875-125 MG tablet Take 1 tablet by mouth 2 (two) times daily. 20 tablet 0  . Ascorbic Acid (VITAMIN C) 1000 MG tablet Take 1,000 mg by mouth daily.    Marland Kitchen aspirin 81 MG tablet Take 81 mg by mouth daily.    Marland Kitchen atorvastatin (LIPITOR) 80 MG tablet Take 1 tablet (80 mg total) by mouth daily at 6 PM. 90 tablet 2  . budesonide-formoterol (SYMBICORT) 160-4.5 MCG/ACT inhaler Inhale 2 puffs into the lungs 2 (two) times daily.    . Coenzyme Q10 (COQ10) 400 MG CAPS Take 1 capsule by mouth daily.     . Flaxseed, Linseed, (FLAXSEED OIL) 1000 MG CAPS Take 1 capsule by mouth daily.    . fluticasone (FLONASE) 50 MCG/ACT nasal spray USE 1 SPRAY IN EACH NOSTRIL DAILY AS NEEDED 16 g 4  . hydrOXYzine (ATARAX/VISTARIL) 10 MG tablet Take 10 mg by mouth at bedtime as needed.    Marland Kitchen ibuprofen (ADVIL,MOTRIN) 200 MG tablet Take 800 mg by mouth 2 (two) times daily as needed.     Marland Kitchen ketotifen (ZADITOR) 0.025 % ophthalmic solution Place 1 drop into both eyes 2 (two) times daily as needed.    . lidocaine (XYLOCAINE) 5 % ointment Apply 1 application topically as needed for mild pain (For Back and hip).     . loratadine (CLARITIN) 10 MG tablet Take  10 mg by mouth daily as needed for allergies.    . Magnesium 250 MG TABS Take 2 tablets by mouth daily.     . mometasone (ELOCON) 0.1 % lotion APPLY TOPICALLY DAILY 60 mL 3  . omeprazole (PRILOSEC) 20 MG capsule TAKE 1 CAPSULE DAILY AS NEEDED 90 capsule 1  . SPIRIVA HANDIHALER 18 MCG inhalation capsule INHALE THE CONTENTS OF 1 CAPSULE DAILY 90 capsule 1  . tamsulosin (FLOMAX) 0.4 MG CAPS capsule Take 1 tablet by mouth at bedtime. 90 capsule 1  . triamcinolone (KENALOG) 0.025 % cream Apply 1 application topically 2 (two) times daily. Reported on 05/14/2016    . Turmeric Curcumin 500 MG CAPS Take 1 capsule by mouth daily.     No current facility-administered medications for this visit.       SURGICAL HISTORY:  Past Surgical History:  Procedure Laterality Date  . CIRCUMCISION    . COLONOSCOPY  03/04/2004   PPJ:KDTO-IZTIW diverticula.  The remainder of the colonic mucosa appeared normal/normal rectum  . COLONOSCOPY  05/22/2009   PYK:DXIPJAS mucosa appeared normal/Left-sided diverticula/Normal rectum  . COLONOSCOPY N/A 08/02/2014   Procedure: COLONOSCOPY;  Surgeon: Daneil Dolin, MD;  [facility];  Service: Endoscopy;  Laterality: N/A;  2:00 PM  . KNEE ARTHROSCOPY Right 2004  . LYMPH NODE DISSECTION N/A 04/08/2016   Procedure: LYMPH NODE DISSECTION;  Surgeon: Melrose Nakayama, MD;  Location: [facility];  Service: Thoracic;  Laterality: N/A;  . SEPTOPLASTY    . VIDEO ASSISTED THORACOSCOPY (VATS)/WEDGE RESECTION Left 04/08/2016   Procedure: VIDEO ASSISTED THORACOSCOPY (VATS)/WEDGE RESECTION;  Surgeon: Melrose Nakayama, MD;  Location: [facility];  Service: Thoracic;  Laterality: Left;  Marland Kitchen VIDEO BRONCHOSCOPY WITH ENDOBRONCHIAL NAVIGATION N/A 02/26/2016   Procedure: VIDEO BRONCHOSCOPY WITH ENDOBRONCHIAL NAVIGATION LEFT UPPER LOBE LUNG NODULE;  Surgeon: [name], MD;  Location: [facility];  Service: Thoracic;  Laterality: N/A;    REVIEW OF SYSTEMS:  A comprehensive review of systems was negative except for: Ears, nose, mouth, throat, and face: positive for nasal congestion   PHYSICAL EXAMINATION: General appearance: alert, cooperative and no distress Head: Normocephalic, without obvious abnormality, atraumatic Neck: no adenopathy, no JVD, supple, symmetrical, trachea midline and thyroid not enlarged, symmetric, no tenderness/mass/nodules Lymph nodes: Cervical, supraclavicular, and axillary nodes normal. Resp: clear to auscultation bilaterally Back: symmetric, no curvature. ROM normal. No CVA tenderness. Cardio: regular rate and rhythm, S1, S2 normal, no murmur, click, rub or gallop GI: soft, non-tender; bowel sounds normal; no masses,  no organomegaly Extremities:  extremities normal, atraumatic, no cyanosis or edema  ECOG PERFORMANCE STATUS: 1 - Symptomatic but completely ambulatory  Blood pressure 132/76, pulse 95, temperature 99 F (37.2 C), temperature source Oral, resp. rate 18, height '5\' 6"'$  (1.676 m), weight 216 lb 1.6 oz (98 kg), SpO2 97 %.  LABORATORY DATA: Lab Results  Component Value Date   WBC 8.4 11/29/2017   HGB 13.4 11/29/2017   HCT 40.0 11/29/2017   MCV 93.5 11/29/2017   PLT 236 11/29/2017      Chemistry      Component Value Date/Time   NA 140 11/29/2017 0911   NA 140 05/18/2017 1056   K 3.8 11/29/2017 0911   K 4.0 05/18/2017 1056   CL 104 11/29/2017 0911   CO2 26 11/29/2017 0911   CO2 27 05/18/2017 1056   BUN 16 11/29/2017 0911   BUN 16.8 05/18/2017 1056   CREATININE 1.22 11/29/2017 0911   CREATININE 1.1 05/18/2017 1056  Component Value Date/Time   CALCIUM 9.3 11/29/2017 0911   CALCIUM 10.3 05/18/2017 1056   ALKPHOS 93 11/29/2017 0911   ALKPHOS 92 05/18/2017 1056   AST 24 11/29/2017 0911   AST 20 05/18/2017 1056   ALT 25 11/29/2017 0911   ALT 20 05/18/2017 1056   BILITOT 0.8 11/29/2017 0911   BILITOT 0.5 11/06/2017 1027   BILITOT 0.72 05/18/2017 1056       RADIOGRAPHIC STUDIES: Ct Chest W Contrast  Result Date: 11/29/2017 CLINICAL DATA:  Status post left upper lobe wedge resection for lung cancer. EXAM: CT CHEST WITH CONTRAST TECHNIQUE: Multidetector CT imaging of the chest was performed during intravenous contrast administration. CONTRAST:  6m ISOVUE-300 IOPAMIDOL (ISOVUE-300) INJECTION 61% COMPARISON:  05/18/2017 FINDINGS: Cardiovascular: The heart size is normal. No pericardial effusion. Coronary artery calcification is evident. Atherosclerotic calcification is noted in the wall of the thoracic aorta. Mediastinum/Nodes: Similar appearance of scattered normal sized mediastinal lymph nodes. 1 of the more dominant lymph nodes is in the subcarinal station measuring 9 mm and stable in the interval. No hilar  lymphadenopathy. The esophagus has normal imaging features. There is no axillary lymphadenopathy. Lungs/Pleura: Surgical scarring left lung compatible with reported history of wedge resection. 16 mm nodular opacity adjacent to the suture line measured on the prior study is stable at 16 mm on today's exam. Stable appearance of scarring in the lateral left lung base. Centrilobular emphysema noted. Stable 2 mm are perifissural nodule right lung on image 75. No focal airspace consolidation. No pulmonary edema or pleural effusion. Upper Abdomen: Unremarkable. Musculoskeletal: Bone windows reveal no worrisome lytic or sclerotic osseous lesions. IMPRESSION: 1. Stable exam. Nodular opacity adjacent to the left upper lobe suture line is unchanged in the interval. No new or progressive disease on today's study. 2.  Emphysema. (ICD10-J43.9) 3.  Aortic Atherosclerois (ICD10-170.0) Electronically Signed   By: EMisty StanleyM.D.   On: 11/29/2017 12:51    ASSESSMENT AND PLAN: This is a very pleasant 72years old white male with a stage IA non-small cell lung cancer status post left upper lobe lingular segmentectomy in May 2017. The patient has been in observation since that time. Repeat CT scan of the chest showed no evidence for disease progression or recurrence. I discussed the scan results with the patient and recommended for him to continue on observation with repeat CT scan of the chest in 6 months. He was advised to call immediately if he has any concerning symptoms in the interval. The patient voices understanding of current disease status and treatment options and is in agreement with the current care plan. All questions were answered. The patient knows to call the clinic with any problems, questions or concerns. We can certainly see the patient much sooner if necessary. I spent 10 minutes counseling the patient face to face. The total time spent in the appointment was 15 minutes.  Disclaimer: This note was  dictated with voice recognition software. Similar sounding words can inadvertently be transcribed and may not be corrected upon review.

## 2017-12-02 NOTE — Telephone Encounter (Signed)
Scheduled appt per 1/24 los - lab/ follow up / Ct in 6 months - sent reminder letter in the mail .

## 2017-12-29 ENCOUNTER — Encounter: Payer: Medicare Other | Admitting: Family Medicine

## 2018-01-10 ENCOUNTER — Ambulatory Visit (INDEPENDENT_AMBULATORY_CARE_PROVIDER_SITE_OTHER): Payer: Medicare Other | Admitting: Family Medicine

## 2018-01-10 ENCOUNTER — Encounter: Payer: Self-pay | Admitting: Family Medicine

## 2018-01-10 VITALS — BP 120/80 | Ht 66.0 in | Wt 216.0 lb

## 2018-01-10 DIAGNOSIS — R7301 Impaired fasting glucose: Secondary | ICD-10-CM | POA: Diagnosis not present

## 2018-01-10 DIAGNOSIS — Z0000 Encounter for general adult medical examination without abnormal findings: Secondary | ICD-10-CM | POA: Diagnosis not present

## 2018-01-10 LAB — POCT GLYCOSYLATED HEMOGLOBIN (HGB A1C): Hemoglobin A1C: 5.4

## 2018-01-10 MED ORDER — ZOSTER VAC RECOMB ADJUVANTED 50 MCG/0.5ML IM SUSR: 0.5000 mL | Freq: Once | INTRAMUSCULAR | 0 refills | Status: AC

## 2018-01-10 NOTE — Progress Notes (Signed)
   Subjective:    Patient ID: Miguel Morales, male    DOB: 09-18-1946, 72 y.o.   MRN: [id number]  HPI  The patient comes in today for a wellness visit.  Patient has good job keeping sugars under good control by dietary measures alone stays physically active.  A review of their health history was completed.  A review of medications was also completed.  Any needed refills;No he gets his medications through the [facility] hospital and sees a [place] doctor on a regular basis  Eating habits: Good  Falls/  MVA accidents in past few months: None  Regular exercise: Yes  [facility] Julien Nordmann [place] Dr. Dermatologist with the [facility]   Preventative health issues were discussed.   Additional concerns: No   Results for orders placed or performed in visit on 01/10/18  POCT glycosylated hemoglobin (Hb A1C)  Result Value Ref Range   Hemoglobin A1C 5.4      Review of Systems  Constitutional: Negative for activity change, appetite change and fever.  HENT: Negative for congestion and rhinorrhea.   Eyes: Negative for discharge.  Respiratory: Negative for cough and wheezing.   Cardiovascular: Negative for chest pain.  Gastrointestinal: Negative for abdominal pain, blood in stool and vomiting.  Genitourinary: Negative for difficulty urinating and frequency.  Musculoskeletal: Negative for neck pain.  Skin: Negative for rash.  Allergic/Immunologic: Negative for environmental allergies and food allergies.  Neurological: Negative for weakness and headaches.  Psychiatric/Behavioral: Negative for agitation.       Objective:   Physical Exam  Constitutional: He appears well-developed and well-nourished.  HENT:  Head: Normocephalic and atraumatic.  Right Ear: External ear normal.  Left Ear: External ear normal.  Nose: Nose normal.  Mouth/Throat: Oropharynx is clear and moist.  Eyes: Right eye exhibits no discharge. Left eye exhibits no discharge. No scleral icterus.  Neck:  Normal range of motion. Neck supple. No thyromegaly present.  Cardiovascular: Normal rate, regular rhythm and normal heart sounds.  No murmur heard. Pulmonary/Chest: Effort normal and breath sounds normal. No respiratory distress. He has no wheezes.  Abdominal: Soft. Bowel sounds are normal. He exhibits no distension and no mass. There is no tenderness.  Genitourinary: Penis normal.  Musculoskeletal: Normal range of motion. He exhibits no edema.  Lymphadenopathy:    He has no cervical adenopathy.  Neurological: He is alert. He exhibits normal muscle tone. Coordination normal.  Skin: Skin is warm and dry. No erythema.  Psychiatric: He has a normal mood and affect. His behavior is normal. Judgment normal.   No falls denies being depressed       Assessment & Plan:  Lung cancer followed by specialist Adult wellness-complete.wellness physical was conducted today. Importance of diet and exercise were discussed in detail. In addition to this a discussion regarding safety was also covered. We also reviewed over immunizations and gave recommendations regarding current immunization needed for age. In addition to this additional areas were also touched on including: Preventative health exams needed: Colonoscopy due in 2025 Prostate exam normal PSA looks good Lipid profile overall looks good Patient stable continue current medications  Patient was advised yearly wellness exam

## 2018-01-21 DIAGNOSIS — J09X2 Influenza due to identified novel influenza A virus with other respiratory manifestations: Secondary | ICD-10-CM | POA: Diagnosis not present

## 2018-01-21 DIAGNOSIS — R6889 Other general symptoms and signs: Secondary | ICD-10-CM | POA: Diagnosis not present

## 2018-01-25 ENCOUNTER — Encounter: Payer: Self-pay | Admitting: Family Medicine

## 2018-01-25 ENCOUNTER — Ambulatory Visit (INDEPENDENT_AMBULATORY_CARE_PROVIDER_SITE_OTHER): Payer: Medicare Other | Admitting: Family Medicine

## 2018-01-25 VITALS — BP 118/80 | Temp 98.0°F | Ht 66.0 in | Wt 214.0 lb

## 2018-01-25 DIAGNOSIS — J019 Acute sinusitis, unspecified: Secondary | ICD-10-CM | POA: Diagnosis not present

## 2018-01-25 MED ORDER — CEFPROZIL 500 MG PO TABS: ORAL_TABLET | ORAL | 0 refills | Status: DC

## 2018-01-25 MED ORDER — HYDROCODONE-HOMATROPINE 5-1.5 MG/5ML PO SYRP: ORAL_SOLUTION | ORAL | 0 refills | Status: DC

## 2018-01-25 MED ORDER — PREDNISONE 10 MG PO TABS: ORAL_TABLET | ORAL | 0 refills | Status: DC

## 2018-01-25 NOTE — Progress Notes (Signed)
   Subjective:    Patient ID: Miguel Morales, male    DOB: May 23, 1946, 72 y.o.   MRN: [id number]  HPI Pt is here today with headache,sinus drainage,cough,Wheezing,runny nose,sore throat,fevers,chest congestion which has been ongoing since Thursday. He has tried mucinexDm,Tylenol,Benzonatate,oseltamivir,Vit c. States he saw an Urgent care in [facility] Friday and they Dx'd with the flu.States he does not see that it has been improving.   Coughing and wheezing and aching   Got the tamilu generic and mucinex ptn  Got mucinex anfd benzonate  hasnt helped much   Cough productive and jgunky and muus and discharge and dranage now     Review of Systems No headache, no major weight loss or weight gain, no chest pain no back pain abdominal pain no change in bowel habits complete ROS otherwise negative     Objective:   Physical Exam Alert, mild malaise.  HEENT.  Moderate nasal congestion.  Frontal tenderness.  Lungs bilateral wheezes heart regular rate and rhythm.     Assessment & Plan:  Impression post flu rhinosinusitis/bronchitis with exacerbation reactive airways steroids prescribed antibiotics prescribed cough medicine prescribed symptom care warning signs discussed

## 2018-02-14 ENCOUNTER — Encounter: Payer: Self-pay | Admitting: Family Medicine

## 2018-02-14 ENCOUNTER — Ambulatory Visit (HOSPITAL_COMMUNITY)
Admission: RE | Admit: 2018-02-14 | Discharge: 2018-02-14 | Disposition: A | Discharge status: Home / Self Care | Payer: Medicare Other | Source: Ambulatory Visit | Attending: Family Medicine | Admitting: Family Medicine

## 2018-02-14 ENCOUNTER — Ambulatory Visit (INDEPENDENT_AMBULATORY_CARE_PROVIDER_SITE_OTHER): Payer: Medicare Other | Admitting: Family Medicine

## 2018-02-14 VITALS — BP 128/82 | Temp 98.8°F | Ht 66.0 in | Wt 216.2 lb

## 2018-02-14 DIAGNOSIS — R05 Cough: Secondary | ICD-10-CM | POA: Diagnosis not present

## 2018-02-14 DIAGNOSIS — Z9889 Other specified postprocedural states: Secondary | ICD-10-CM | POA: Diagnosis not present

## 2018-02-14 DIAGNOSIS — J441 Chronic obstructive pulmonary disease with (acute) exacerbation: Secondary | ICD-10-CM | POA: Diagnosis not present

## 2018-02-14 MED ORDER — PREDNISONE 20 MG PO TABS: ORAL_TABLET | ORAL | 0 refills | Status: DC

## 2018-02-14 MED ORDER — METHYLPREDNISOLONE ACETATE 40 MG/ML IJ SUSP
40.0000 mg | Freq: Once | INTRAMUSCULAR | Status: AC
  Administered 2018-02-14: 40 mg via INTRAMUSCULAR

## 2018-02-14 MED ORDER — AZITHROMYCIN 250 MG PO TABS: ORAL_TABLET | ORAL | 0 refills | Status: DC

## 2018-02-14 NOTE — Progress Notes (Signed)
   Subjective:    Patient ID: Miguel Morales, male    DOB: [birth year], 72 y.o.   MRN: [id number]  Cough  This is a new problem. The current episode started 1 to 4 weeks ago. Associated symptoms include headaches, nasal congestion, rhinorrhea, a sore throat and wheezing. Pertinent negatives include no chest pain, chills, ear pain or fever. Treatments tried: otc meds.  Patient with ongoing cough congestion wheezing denies severe shortness of breath.  Denies high fever chills sweats coughing up some phlegm no nausea vomiting or diarrhea was seen here earlier in March has history of COPD and history of lung cancer which is been treated    Review of Systems  Constitutional: Negative for activity change, chills and fever.  HENT: Positive for rhinorrhea and sore throat. Negative for congestion and ear pain.   Eyes: Negative for discharge.  Respiratory: Positive for cough and wheezing.   Cardiovascular: Negative for chest pain.  Gastrointestinal: Negative for nausea and vomiting.  Musculoskeletal: Negative for arthralgias.  Neurological: Positive for headaches.       Objective:   Physical Exam  Constitutional: He appears well-developed.  HENT:  Head: Normocephalic and atraumatic.  Mouth/Throat: Oropharynx is clear and moist. No oropharyngeal exudate.  Eyes: Right eye exhibits no discharge. Left eye exhibits no discharge.  Neck: Normal range of motion.  Cardiovascular: Normal rate, regular rhythm and normal heart sounds.  No murmur heard. Pulmonary/Chest: Effort normal. No respiratory distress. He has wheezes. He has no rales. He exhibits no tenderness.  Lymphadenopathy:    He has no cervical adenopathy.  Neurological: He exhibits normal muscle tone.  Skin: Skin is warm and dry.  Nursing note and vitals reviewed.         Assessment & Plan:  COPD exacerbation Bacterial secondary infection Antibiotics prednisone Depo-Medrol Chest x-ray to rule out pneumonia Warning signs  discussed follow-up if not improving over the next several days O2 saturation 97%

## 2018-03-18 ENCOUNTER — Ambulatory Visit (INDEPENDENT_AMBULATORY_CARE_PROVIDER_SITE_OTHER): Payer: Medicare Other | Admitting: Family Medicine

## 2018-03-18 VITALS — BP 122/82 | Temp 98.4°F | Ht 66.0 in | Wt 212.0 lb

## 2018-03-18 DIAGNOSIS — J019 Acute sinusitis, unspecified: Secondary | ICD-10-CM

## 2018-03-18 DIAGNOSIS — J449 Chronic obstructive pulmonary disease, unspecified: Secondary | ICD-10-CM

## 2018-03-18 MED ORDER — AZELASTINE HCL 0.1 % NA SOLN: 1.0000 | Freq: Two times a day (BID) | NASAL | 12 refills | Status: DC

## 2018-03-18 MED ORDER — AMOXICILLIN-POT CLAVULANATE 875-125 MG PO TABS: 1.0000 | ORAL_TABLET | Freq: Two times a day (BID) | ORAL | 0 refills | Status: DC

## 2018-03-18 MED ORDER — CEFTRIAXONE SODIUM 1 G IJ SOLR
500.0000 mg | Freq: Once | INTRAMUSCULAR | Status: AC
  Administered 2018-03-18: 500 mg via INTRAMUSCULAR

## 2018-03-18 NOTE — Progress Notes (Signed)
   Subjective:    Patient ID: Miguel Morales, male    DOB: 05/06/1946, 72 y.o.   MRN: [id number]  Sinus Problem  This is a new problem. The current episode started 1 to 4 weeks ago. Associated symptoms include congestion, coughing and a sore throat. Pertinent negatives include no chills or ear pain. Treatments tried: antibiotics, cough med.   Sinus congestion drainage been going on for several weeks.  Chest aspect doing better.  Does have underlying COPD.  Uses all his medicines.   Review of Systems  Constitutional: Negative for activity change, chills and fever.  HENT: Positive for congestion, rhinorrhea and sore throat. Negative for ear pain.   Eyes: Negative for discharge.  Respiratory: Positive for cough. Negative for wheezing.   Cardiovascular: Negative for chest pain.  Gastrointestinal: Negative for nausea and vomiting.  Musculoskeletal: Negative for arthralgias.       Objective:   Physical Exam  Constitutional: He appears well-developed.  HENT:  Head: Normocephalic.  Mouth/Throat: Oropharynx is clear and moist. No oropharyngeal exudate.  Neck: Normal range of motion.  Cardiovascular: Normal rate, regular rhythm and normal heart sounds.  No murmur heard. Pulmonary/Chest: Effort normal and breath sounds normal. He has no wheezes.  Lymphadenopathy:    He has no cervical adenopathy.  Neurological: He exhibits normal muscle tone.  Skin: Skin is warm and dry.  Nursing note and vitals reviewed.         Assessment & Plan:  Viral syndrome with secondary rhinosinusitis Antibiotics prescribed Additional sprayed prescribed Continue all current allergy medicines and COPD medicines Warning signs discussed in detail If ongoing trouble referral to ENT

## 2018-05-24 ENCOUNTER — Inpatient Hospital Stay: Payer: Medicare Other | Attending: Internal Medicine

## 2018-05-24 ENCOUNTER — Encounter (HOSPITAL_COMMUNITY): Payer: Self-pay

## 2018-05-24 ENCOUNTER — Ambulatory Visit (HOSPITAL_COMMUNITY)
Admission: RE | Admit: 2018-05-24 | Discharge: 2018-05-24 | Disposition: A | Discharge status: Home / Self Care | Payer: Medicare Other | Source: Ambulatory Visit | Attending: Internal Medicine | Admitting: Internal Medicine

## 2018-05-24 DIAGNOSIS — R911 Solitary pulmonary nodule: Secondary | ICD-10-CM | POA: Diagnosis not present

## 2018-05-24 DIAGNOSIS — E782 Mixed hyperlipidemia: Secondary | ICD-10-CM | POA: Diagnosis not present

## 2018-05-24 DIAGNOSIS — G4730 Sleep apnea, unspecified: Secondary | ICD-10-CM | POA: Insufficient documentation

## 2018-05-24 DIAGNOSIS — I10 Essential (primary) hypertension: Secondary | ICD-10-CM | POA: Insufficient documentation

## 2018-05-24 DIAGNOSIS — Z79899 Other long term (current) drug therapy: Secondary | ICD-10-CM | POA: Diagnosis not present

## 2018-05-24 DIAGNOSIS — I700 Atherosclerosis of aorta: Secondary | ICD-10-CM | POA: Diagnosis not present

## 2018-05-24 DIAGNOSIS — Z85118 Personal history of other malignant neoplasm of bronchus and lung: Secondary | ICD-10-CM | POA: Diagnosis not present

## 2018-05-24 DIAGNOSIS — J439 Emphysema, unspecified: Secondary | ICD-10-CM | POA: Insufficient documentation

## 2018-05-24 DIAGNOSIS — C3490 Malignant neoplasm of unspecified part of unspecified bronchus or lung: Secondary | ICD-10-CM

## 2018-05-24 DIAGNOSIS — J449 Chronic obstructive pulmonary disease, unspecified: Secondary | ICD-10-CM | POA: Insufficient documentation

## 2018-05-24 DIAGNOSIS — Z7982 Long term (current) use of aspirin: Secondary | ICD-10-CM | POA: Insufficient documentation

## 2018-05-24 DIAGNOSIS — K219 Gastro-esophageal reflux disease without esophagitis: Secondary | ICD-10-CM | POA: Diagnosis not present

## 2018-05-24 LAB — CBC WITH DIFFERENTIAL (CANCER CENTER ONLY)
Basophils Absolute: 0 K/uL (ref 0.0–0.1)
Basophils Relative: 1 %
Eosinophils Absolute: 0.2 K/uL (ref 0.0–0.5)
Eosinophils Relative: 3 %
HCT: 42.8 % (ref 38.4–49.9)
Hemoglobin: 14.2 g/dL (ref 13.0–17.1)
Lymphocytes Relative: 19 %
Lymphs Abs: 1.3 K/uL (ref 0.9–3.3)
MCH: 31.3 pg (ref 27.2–33.4)
MCHC: 33.3 g/dL (ref 32.0–36.0)
MCV: 94.1 fL (ref 79.3–98.0)
Monocytes Absolute: 0.6 K/uL (ref 0.1–0.9)
Monocytes Relative: 10 %
Neutro Abs: 4.5 K/uL (ref 1.5–6.5)
Neutrophils Relative %: 67 %
Platelet Count: 247 K/uL (ref 140–400)
RBC: 4.54 MIL/uL (ref 4.20–5.82)
RDW: 13.2 % (ref 11.0–14.6)
WBC Count: 6.7 K/uL (ref 4.0–10.3)

## 2018-05-24 LAB — CMP (CANCER CENTER ONLY)
ALT: 27 U/L (ref 0–44)
AST: 26 U/L (ref 15–41)
Albumin: 4.2 g/dL (ref 3.5–5.0)
Alkaline Phosphatase: 94 U/L (ref 38–126)
Anion gap: 10 (ref 5–15)
BUN: 16 mg/dL (ref 8–23)
CALCIUM: 9.6 mg/dL (ref 8.9–10.3)
CO2: 25 mmol/L (ref 22–32)
CREATININE: 1.14 mg/dL (ref 0.61–1.24)
Chloride: 104 mmol/L (ref 98–111)
GFR, Estimated: >60 mL/min (ref >60)
Glucose, Bld: 181 mg/dL — ABNORMAL HIGH (ref 70–99)
Potassium: 3.9 mmol/L (ref 3.5–5.1)
Sodium: 139 mmol/L (ref 135–145)
Total Bilirubin: 0.8 mg/dL (ref 0.3–1.2)
Total Protein: 7.3 g/dL (ref 6.5–8.1)

## 2018-05-24 MED ORDER — IOHEXOL 300 MG/ML  SOLN
75.0000 mL | Freq: Once | INTRAMUSCULAR | Status: AC | PRN
  Administered 2018-05-24: 75 mL via INTRAVENOUS

## 2018-05-26 ENCOUNTER — Encounter: Payer: Self-pay | Admitting: Internal Medicine

## 2018-05-26 ENCOUNTER — Telehealth: Payer: Self-pay | Admitting: Internal Medicine

## 2018-05-26 ENCOUNTER — Inpatient Hospital Stay (HOSPITAL_BASED_OUTPATIENT_CLINIC_OR_DEPARTMENT_OTHER): Payer: Medicare Other | Admitting: Internal Medicine

## 2018-05-26 VITALS — BP 132/72 | HR 80 | Temp 98.8°F | Resp 18 | Ht 66.0 in | Wt 213.7 lb

## 2018-05-26 DIAGNOSIS — I10 Essential (primary) hypertension: Secondary | ICD-10-CM | POA: Diagnosis not present

## 2018-05-26 DIAGNOSIS — C3490 Malignant neoplasm of unspecified part of unspecified bronchus or lung: Secondary | ICD-10-CM

## 2018-05-26 DIAGNOSIS — K219 Gastro-esophageal reflux disease without esophagitis: Secondary | ICD-10-CM | POA: Diagnosis not present

## 2018-05-26 DIAGNOSIS — Z85118 Personal history of other malignant neoplasm of bronchus and lung: Secondary | ICD-10-CM

## 2018-05-26 DIAGNOSIS — J449 Chronic obstructive pulmonary disease, unspecified: Secondary | ICD-10-CM | POA: Diagnosis not present

## 2018-05-26 DIAGNOSIS — E782 Mixed hyperlipidemia: Secondary | ICD-10-CM | POA: Diagnosis not present

## 2018-05-26 DIAGNOSIS — G4730 Sleep apnea, unspecified: Secondary | ICD-10-CM | POA: Diagnosis not present

## 2018-05-26 NOTE — Telephone Encounter (Signed)
Gave patient avs and calendar of upcoming July 2020 appts.

## 2018-05-26 NOTE — Progress Notes (Signed)
[facility] [phone]   [phone]  OFFICE PROGRESS NOTE  [name], MD [place] [address]  DIAGNOSIS: Stage IA (T1a, N0, M0) non-small cell lung cancer, adenocarcinoma with negative EGFR, ALK, ROS 1 and BRAF mutations diagnosed in May 2017. PDL 1 expression is 10%  PRIOR THERAPY: Status post left upper lobe lingular segmentectomy with lymph node dissection under the care of Dr. Roxan Hockey on 04/08/2016.  CURRENT THERAPY: Observation.  INTERVAL HISTORY: Miguel Morales 72 y.o. male returns to the clinic today for six-month follow-up visit.  The patient is feeling fine today with no concerning complaints except for shortness of breath with exertion.  He denied having any recent weight loss or night sweats.  He has no nausea, vomiting, diarrhea or constipation.  He has no fever or chills.  He had repeat CT scan of the chest performed recently and he is here for evaluation and discussion of his discuss results.   MEDICAL HISTORY: Past Medical History:  Diagnosis Date  . Arthritis   . Asthma   . Cancer ([place])    skin  . COPD (chronic obstructive pulmonary disease) ([place])   . GERD (gastroesophageal reflux disease)   . History of bronchitis   . Hypertension   . Mixed hyperlipidemia   . Nocturia   . Shortness of breath dyspnea    with exertion  . Sleep apnea    will sometimes wear a CPAP  . Spinal stenosis     ALLERGIES:  is allergic to adhesive [tape] and sulfa antibiotics.  MEDICATIONS:  Current Outpatient Medications  Medication Sig Dispense Refill  . acetaminophen (TYLENOL) 500 MG tablet Take 1,000 mg by mouth every 6 (six) hours as needed.     Marland Kitchen albuterol (PROVENTIL HFA;VENTOLIN HFA) 108 (90 Base) MCG/ACT inhaler Inhale 2 puffs into the lungs every 6 (six) hours as needed for wheezing. 3 Inhaler 2  . amLODipine (NORVASC) 10 MG tablet TAKE 1 TABLET DAILY 90 tablet 1  . amoxicillin-clavulanate  (AUGMENTIN) 875-125 MG tablet Take 1 tablet by mouth 2 (two) times daily. 28 tablet 0  . Ascorbic Acid (VITAMIN C) 1000 MG tablet Take 1,000 mg by mouth daily.    Marland Kitchen aspirin 81 MG tablet Take 81 mg by mouth daily.    Marland Kitchen atorvastatin (LIPITOR) 80 MG tablet Take 1 tablet (80 mg total) by mouth daily at 6 PM. 90 tablet 2  . azelastine (ASTELIN) 0.1 % nasal spray Place 1 spray into both nostrils 2 (two) times daily. Use in each nostril as directed 30 mL 12  . budesonide-formoterol (SYMBICORT) 160-4.5 MCG/ACT inhaler Inhale 2 puffs into the lungs 2 (two) times daily.    . Coenzyme Q10 (COQ10) 400 MG CAPS Take 2 capsules by mouth daily.     . Flaxseed, Linseed, (FLAXSEED OIL) 1000 MG CAPS Take 1 capsule by mouth daily.    . fluticasone (FLONASE) 50 MCG/ACT nasal spray USE 1 SPRAY IN EACH NOSTRIL DAILY AS NEEDED 16 g 4  . hydrOXYzine (ATARAX/VISTARIL) 10 MG tablet Take 10 mg by mouth at bedtime as needed.    Marland Kitchen ibuprofen (ADVIL,MOTRIN) 200 MG tablet Take 800 mg by mouth 2 (two) times daily as needed.     Marland Kitchen ketotifen (ZADITOR) 0.025 % ophthalmic solution Place 1 drop into both eyes 2 (two) times daily as needed.    . lidocaine (XYLOCAINE) 5 % ointment Apply 1 application topically as needed for mild pain (For  Back and hip).     . loratadine (CLARITIN) 10 MG tablet Take 10 mg by mouth daily as needed for allergies.    . Magnesium 250 MG TABS Take 2 tablets by mouth daily.     . mometasone (ELOCON) 0.1 % lotion APPLY TOPICALLY DAILY 60 mL 3  . omeprazole (PRILOSEC) 20 MG capsule TAKE 1 CAPSULE DAILY AS NEEDED 90 capsule 1  . predniSONE (DELTASONE) 20 MG tablet 3qd for 3d then 2qd for 3d then 1qd for 3d (Patient not taking: Reported on 03/18/2018) 18 tablet 0  . SPIRIVA HANDIHALER 18 MCG inhalation capsule INHALE THE CONTENTS OF 1 CAPSULE DAILY 90 capsule 1  . tamsulosin (FLOMAX) 0.4 MG CAPS capsule Take 1 tablet by mouth at bedtime. 90 capsule 1  . triamcinolone (KENALOG) 0.025 % cream Apply 1 application  topically 2 (two) times daily. Reported on 05/14/2016    . Turmeric Curcumin 500 MG CAPS Take 2 capsules by mouth daily.      No current facility-administered medications for this visit.     SURGICAL HISTORY:  Past Surgical History:  Procedure Laterality Date  . CIRCUMCISION    . COLONOSCOPY  03/04/2004   YNW:[id number] diverticula.  The remainder of the colonic mucosa appeared normal/normal rectum  . COLONOSCOPY  05/22/2009   MVH:QIONGEX mucosa appeared normal/Left-sided diverticula/Normal rectum  . COLONOSCOPY N/A 08/02/2014   Procedure: COLONOSCOPY;  Surgeon: Daneil Dolin, MD;  [facility];  Service: Endoscopy;  Laterality: N/A;  2:00 PM  . KNEE ARTHROSCOPY Right 2004  . LYMPH NODE DISSECTION N/A 04/08/2016   Procedure: LYMPH NODE DISSECTION;  Surgeon: Melrose Nakayama, MD;  Location: [facility];  Service: Thoracic;  Laterality: N/A;  . SEPTOPLASTY    . VIDEO ASSISTED THORACOSCOPY (VATS)/WEDGE RESECTION Left 04/08/2016   Procedure: VIDEO ASSISTED THORACOSCOPY (VATS)/WEDGE RESECTION;  Surgeon: Melrose Nakayama, MD;  Location: [facility];  Service: Thoracic;  Laterality: Left;  Marland Kitchen VIDEO BRONCHOSCOPY WITH ENDOBRONCHIAL NAVIGATION N/A 02/26/2016   Procedure: VIDEO BRONCHOSCOPY WITH ENDOBRONCHIAL NAVIGATION LEFT UPPER LOBE LUNG NODULE;  Surgeon: [name], MD;  Location: [facility];  Service: Thoracic;  Laterality: N/A;    REVIEW OF SYSTEMS:  A comprehensive review of systems was negative except for: Respiratory: positive for dyspnea on exertion   PHYSICAL EXAMINATION: General appearance: alert, cooperative and no distress Head: Normocephalic, without obvious abnormality, atraumatic Neck: no adenopathy, no JVD, supple, symmetrical, trachea midline and thyroid not enlarged, symmetric, no tenderness/mass/nodules Lymph nodes: Cervical, supraclavicular, and axillary nodes normal. Resp: clear to auscultation bilaterally Back: symmetric, no curvature. ROM normal. No CVA  tenderness. Cardio: regular rate and rhythm, S1, S2 normal, no murmur, click, rub or gallop GI: soft, non-tender; bowel sounds normal; no masses,  no organomegaly Extremities: extremities normal, atraumatic, no cyanosis or edema  ECOG PERFORMANCE STATUS: 1 - Symptomatic but completely ambulatory  Blood pressure 132/72, pulse 80, temperature 98.8 F (37.1 C), temperature source Oral, resp. rate 18, height _0  (1.676 m), weight 213 lb 11.2 oz (96.9 kg), SpO2 96 %.  LABORATORY DATA: Lab Results  Component Value Date   WBC 6.7 05/24/2018   HGB 14.2 05/24/2018   HCT 42.8 05/24/2018   MCV 94.1 05/24/2018   PLT 247 05/24/2018      Chemistry      Component Value Date/Time   NA 139 05/24/2018 0911   NA 140 05/18/2017 1056   K 3.9 05/24/2018 0911   K 4.0 05/18/2017 1056   CL 104 05/24/2018 0911  CO2 25 05/24/2018 0911   CO2 27 05/18/2017 1056   BUN 16 05/24/2018 0911   BUN 16.8 05/18/2017 1056   CREATININE 1.14 05/24/2018 0911   CREATININE 1.1 05/18/2017 1056      Component Value Date/Time   CALCIUM 9.6 05/24/2018 0911   CALCIUM 10.3 05/18/2017 1056   ALKPHOS 94 05/24/2018 0911   ALKPHOS 92 05/18/2017 1056   AST 26 05/24/2018 0911   AST 20 05/18/2017 1056   ALT 27 05/24/2018 0911   ALT 20 05/18/2017 1056   BILITOT 0.8 05/24/2018 0911   BILITOT 0.72 05/18/2017 1056       RADIOGRAPHIC STUDIES: Ct Chest W Contrast  Result Date: 05/24/2018 CLINICAL DATA:  Non-small-cell lung cancer diagnosed in March 2017. Left upper lobectomy. EXAM: CT CHEST WITH CONTRAST TECHNIQUE: Multidetector CT imaging of the chest was performed during intravenous contrast administration. CONTRAST:  24m OMNIPAQUE IOHEXOL 300 MG/ML  SOLN COMPARISON:  Chest CT 11/29/2017 and 05/18/2017. FINDINGS: Cardiovascular: There is atherosclerosis of the aorta, great vessels and coronary arteries. No evidence of acute vascular findings. The heart size is normal. There is no pericardial effusion. Mediastinum/Nodes:  There are no enlarged mediastinal, hilar or axillary lymph nodes.There are stable small right hilar and subcarinal lymph nodes. The thyroid gland, trachea and esophagus demonstrate no significant findings. Lungs/Pleura: There is no pleural effusion. Mild centrilobular emphysema again noted. Postsurgical changes in the left upper lobe consistent with wedge resection are again noted. There is stable thickening along the left major fissure with a nodular component adjacent to sutures, measuring 18 x 11 mm on image 71/5. This is not significantly changed. There is a stable tiny perifissural nodule along the right minor fissure (image 87/5). No new or enlarging nodules. Upper abdomen: The visualized upper abdomen appears stable without suspicious findings. Musculoskeletal/Chest wall: There is no chest wall mass or suspicious osseous finding. Stable degenerative changes throughout the spine. IMPRESSION: 1. Stable examination without evidence of local recurrence or metastatic disease. 2. Stable nodular thickening along the suture line and left major fissure. 3. Aortic Atherosclerosis (ICD10-I70.0) and Emphysema (ICD10-J43.9). Electronically Signed   By: WRichardean SaleM.D.   On: 05/24/2018 14:17    ASSESSMENT AND PLAN: This is a very pleasant 72years old white male with a stage IA non-small cell lung cancer status post left upper lobe lingular segmentectomy in May 2017. The patient is current on observation and he is feeling fine. Repeat CT scan of the chest showed no concerning findings for disease recurrence or metastasis. I discussed the scan results with the patient and recommended for him to continue on observation with repeat CT scan of the chest in 1 year. He was advised to call immediately if he has any concerning symptoms in the interval. The patient voices understanding of current disease status and treatment options and is in agreement with the current care plan. All questions were answered. The  patient knows to call the clinic with any problems, questions or concerns. We can certainly see the patient much sooner if necessary. I spent 10 minutes counseling the patient face to face. The total time spent in the appointment was 15 minutes.  Disclaimer: This note was dictated with voice recognition software. Similar sounding words can inadvertently be transcribed and may not be corrected upon review.

## 2018-05-30 ENCOUNTER — Other Ambulatory Visit: Payer: Medicare Other

## 2018-06-01 ENCOUNTER — Ambulatory Visit: Payer: Medicare Other | Admitting: Internal Medicine

## 2018-07-12 ENCOUNTER — Telehealth: Payer: Self-pay | Admitting: Family Medicine

## 2018-07-12 NOTE — Telephone Encounter (Signed)
Discussed with pt. Pt verbalized understanding.  °

## 2018-07-12 NOTE — Telephone Encounter (Signed)
Last labs ordered by dr scott lipid, liver, psa, had cbc, cmp on 05/24/18 ordered by specialist.

## 2018-07-12 NOTE — Telephone Encounter (Signed)
Pt would like to know if lab work is needed before appt tomorrow. Please let pt know if he needs to have labs done tomorrow and reschedule OV.

## 2018-07-12 NOTE — Telephone Encounter (Signed)
I reviewed over his lab work no additional lab work necessary at this time

## 2018-07-13 ENCOUNTER — Encounter: Payer: Self-pay | Admitting: Family Medicine

## 2018-07-13 ENCOUNTER — Ambulatory Visit (INDEPENDENT_AMBULATORY_CARE_PROVIDER_SITE_OTHER): Payer: Medicare Other | Admitting: Family Medicine

## 2018-07-13 VITALS — BP 124/68 | Ht 66.0 in | Wt 212.4 lb

## 2018-07-13 DIAGNOSIS — Z125 Encounter for screening for malignant neoplasm of prostate: Secondary | ICD-10-CM

## 2018-07-13 DIAGNOSIS — R7303 Prediabetes: Secondary | ICD-10-CM | POA: Diagnosis not present

## 2018-07-13 DIAGNOSIS — E782 Mixed hyperlipidemia: Secondary | ICD-10-CM | POA: Diagnosis not present

## 2018-07-13 DIAGNOSIS — J449 Chronic obstructive pulmonary disease, unspecified: Secondary | ICD-10-CM

## 2018-07-13 DIAGNOSIS — R739 Hyperglycemia, unspecified: Secondary | ICD-10-CM

## 2018-07-13 DIAGNOSIS — Z1159 Encounter for screening for other viral diseases: Secondary | ICD-10-CM

## 2018-07-13 DIAGNOSIS — I10 Essential (primary) hypertension: Secondary | ICD-10-CM | POA: Diagnosis not present

## 2018-07-13 DIAGNOSIS — Z79899 Other long term (current) drug therapy: Secondary | ICD-10-CM

## 2018-07-13 LAB — POCT GLYCOSYLATED HEMOGLOBIN (HGB A1C): Hemoglobin A1C: 5.7 % — AB (ref 4.0–5.6)

## 2018-07-13 NOTE — Patient Instructions (Signed)
Labs in Dec or early Jan  Wellness in March

## 2018-07-13 NOTE — Progress Notes (Signed)
   Subjective:    Patient ID: Miguel Morales, male    DOB: 12-28-1945, 72 y.o.   MRN: [id number]  HPI Pt here for 6 month follow up  Patient with prediabetes intermittently has some difficult time controlling his weight tries to watch starches as best he can  Does have blood pressure issues takes medicine watch his diet closely stays physically active  Has hyperlipidemia takes his medicine compliant with medicine no side effects tolerate medicine well continue current measures  COPD see specialist takes medication under good control currently not having any flareup recently. Results for orders placed or performed in visit on 07/13/18  POCT HgB A1C  Result Value Ref Range   Hemoglobin A1C 5.7 (A) 4.0 - 5.6 %   HbA1c POC (<> result, manual entry)     HbA1c, POC (prediabetic range)     HbA1c, POC (controlled diabetic range)      Review of Systems  Constitutional: Negative for diaphoresis and fatigue.  HENT: Negative for congestion and rhinorrhea.   Respiratory: Negative for cough and shortness of breath.   Cardiovascular: Negative for chest pain and leg swelling.  Gastrointestinal: Negative for abdominal pain and diarrhea.  Skin: Negative for color change and rash.  Neurological: Negative for dizziness and headaches.  Psychiatric/Behavioral: Negative for behavioral problems and confusion.       Objective:   Physical Exam  Constitutional: He appears well-nourished. No distress.  HENT:  Head: Normocephalic and atraumatic.  Eyes: Right eye exhibits no discharge. Left eye exhibits no discharge.  Neck: No tracheal deviation present.  Cardiovascular: Normal rate, regular rhythm and normal heart sounds.  No murmur heard. Pulmonary/Chest: Effort normal and breath sounds normal. No respiratory distress.  Musculoskeletal: He exhibits no edema.  Lymphadenopathy:    He has no cervical adenopathy.  Neurological: He is alert. Coordination normal.  Skin: Skin is warm and dry.    Psychiatric: He has a normal mood and affect. His behavior is normal.  Vitals reviewed.    15 minutes was spent with patient today discussing healthcare issues which they came.  More than 50% of this visit-total duration of visit-was spent in counseling and coordination of care.  Please see diagnosis regarding the focus of this coordination and care      Assessment & Plan:  Fasting hyperglycemia as well as prediabetes A1c overall looks good HTN decent control continue current medications Hyperlipidemia continue current medication tolerating well Screening lab work for a physical exam later in X months COPD under stable conditions Recommend follow-up for regular visits

## 2018-07-14 DIAGNOSIS — M25552 Pain in left hip: Secondary | ICD-10-CM | POA: Insufficient documentation

## 2018-08-22 ENCOUNTER — Other Ambulatory Visit: Payer: Self-pay | Admitting: Family Medicine

## 2018-08-22 ENCOUNTER — Telehealth: Payer: Self-pay | Admitting: Family Medicine

## 2018-08-22 MED ORDER — HYDROCODONE-ACETAMINOPHEN 5-325 MG PO TABS: 1.0000 | ORAL_TABLET | Freq: Four times a day (QID) | ORAL | 0 refills | Status: AC | PRN

## 2018-08-22 NOTE — Telephone Encounter (Signed)
Please advise 

## 2018-08-22 NOTE — Telephone Encounter (Signed)
Discussed with pt. Pt wants rx sent to [facility]. And he will pick up later this afternoon

## 2018-08-22 NOTE — Telephone Encounter (Signed)
  In this situation where he is already tried ibuprofen the next step would be hydrocodone when necessary I would recommend hydrocodone 5 mg/325, 1 every 4 hours if he is interested I can send him in some to local pharmacy Please talk with patient find out which pharmacy and I can send it in later today

## 2018-08-22 NOTE — Telephone Encounter (Signed)
1.-Please talk with the patient regarding what his concerns are what he is hoping to have done-I essentially need more information 2.-Has forgetting MRI literally within a 3-day span- not likely to be able to get approved through insurance company plus also have a open slot at the MRI machine for this 3.-  Nurses when talking with the patient this seems to me like it will be something that his specialist can talk to him evaluate him then order what tests they feel is most appropriate

## 2018-08-22 NOTE — Telephone Encounter (Signed)
Pt.notified

## 2018-08-22 NOTE — Telephone Encounter (Signed)
Left message to return call 

## 2018-08-22 NOTE — Telephone Encounter (Signed)
Pt states he has spinal stenosis and he is having to use ibuprofen 1200mg  every 4 hours and tylenol 1000 mg every 4 hours. He alternatives between the two every 2 hours. Also using heating pad and ice. Saw dr Ellene Route in January and has appt this Thursday. Dr Ellene Route did not say he would not see without MRI. Pt just thought it would be best. Explained to pt that unlikely to be able to get mri ordered, precerted and scheduled by Thursday because it could take up to 3 days to hear back from insurance company and mri are not usually scheduled the same week. Usually booked out a couple of weeks. Pt verbalized understanding. Would like to know if he could have something for pain.

## 2018-08-22 NOTE — Telephone Encounter (Signed)
The prescription was sent into the pharmacy for the patient to pick up no further action

## 2018-08-22 NOTE — Telephone Encounter (Signed)
Pt is going to see neurologist on Thursday and would like to have another MRI ordered and done before that appt so the doc will have that to go by.

## 2018-08-25 DIAGNOSIS — M25552 Pain in left hip: Secondary | ICD-10-CM | POA: Diagnosis not present

## 2018-08-25 DIAGNOSIS — M4316 Spondylolisthesis, lumbar region: Secondary | ICD-10-CM | POA: Diagnosis not present

## 2018-08-25 DIAGNOSIS — M48062 Spinal stenosis, lumbar region with neurogenic claudication: Secondary | ICD-10-CM | POA: Diagnosis not present

## 2018-08-25 DIAGNOSIS — M47816 Spondylosis without myelopathy or radiculopathy, lumbar region: Secondary | ICD-10-CM | POA: Diagnosis not present

## 2018-08-30 DIAGNOSIS — M4316 Spondylolisthesis, lumbar region: Secondary | ICD-10-CM | POA: Diagnosis not present

## 2018-08-30 DIAGNOSIS — M545 Low back pain: Secondary | ICD-10-CM | POA: Diagnosis not present

## 2018-09-05 DIAGNOSIS — M5116 Intervertebral disc disorders with radiculopathy, lumbar region: Secondary | ICD-10-CM | POA: Diagnosis not present

## 2018-09-05 DIAGNOSIS — M5416 Radiculopathy, lumbar region: Secondary | ICD-10-CM | POA: Diagnosis not present

## 2018-09-28 DIAGNOSIS — J40 Bronchitis, not specified as acute or chronic: Secondary | ICD-10-CM | POA: Diagnosis not present

## 2018-09-28 DIAGNOSIS — J441 Chronic obstructive pulmonary disease with (acute) exacerbation: Secondary | ICD-10-CM | POA: Diagnosis not present

## 2018-09-28 DIAGNOSIS — Z87891 Personal history of nicotine dependence: Secondary | ICD-10-CM | POA: Diagnosis not present

## 2018-09-28 DIAGNOSIS — J029 Acute pharyngitis, unspecified: Secondary | ICD-10-CM | POA: Diagnosis not present

## 2018-10-31 DIAGNOSIS — M5416 Radiculopathy, lumbar region: Secondary | ICD-10-CM | POA: Diagnosis not present

## 2018-10-31 DIAGNOSIS — M5116 Intervertebral disc disorders with radiculopathy, lumbar region: Secondary | ICD-10-CM | POA: Diagnosis not present

## 2018-11-07 ENCOUNTER — Telehealth: Payer: Self-pay | Admitting: Family Medicine

## 2018-11-07 NOTE — Telephone Encounter (Signed)
Patient last had lab drawn 07/13/2018 Bmet,lipid,hepatic,function panel,psa,Hep C, HgbA1c.Please advise.

## 2018-11-07 NOTE — Telephone Encounter (Signed)
Patient is requesting lab work has appointment on Friday and would like to get it done in the morning I explained to him it might not be ready today but the nurse will notify him when they are ready.

## 2018-11-08 DIAGNOSIS — Z1159 Encounter for screening for other viral diseases: Secondary | ICD-10-CM | POA: Diagnosis not present

## 2018-11-08 DIAGNOSIS — Z125 Encounter for screening for malignant neoplasm of prostate: Secondary | ICD-10-CM | POA: Diagnosis not present

## 2018-11-08 DIAGNOSIS — R739 Hyperglycemia, unspecified: Secondary | ICD-10-CM | POA: Diagnosis not present

## 2018-11-08 DIAGNOSIS — Z0000 Encounter for general adult medical examination without abnormal findings: Secondary | ICD-10-CM | POA: Diagnosis not present

## 2018-11-08 DIAGNOSIS — I10 Essential (primary) hypertension: Secondary | ICD-10-CM | POA: Diagnosis not present

## 2018-11-08 DIAGNOSIS — E782 Mixed hyperlipidemia: Secondary | ICD-10-CM | POA: Diagnosis not present

## 2018-11-08 NOTE — Telephone Encounter (Signed)
Nurses Please look at the lab area-next to the lab  still says that they are active orders His labs were ordered back in September he never got them completed Those orders are still valid I recommend the patient to do his lab work at his convenience Thank you

## 2018-11-08 NOTE — Telephone Encounter (Signed)
Pt notified. He states he went this morning and had labs done.

## 2018-11-09 LAB — BASIC METABOLIC PANEL
BUN/Creatinine Ratio: 24 (ref 10–24)
BUN: 25 mg/dL (ref 8–27)
CALCIUM: 9.8 mg/dL (ref 8.6–10.2)
CO2: 21 mmol/L (ref 20–29)
CREATININE: 1.05 mg/dL (ref 0.76–1.27)
Chloride: 101 mmol/L (ref 96–106)
GFR calc non Af Amer: 70 mL/min/{1.73_m2} (ref >59)
GFR, EST AFRICAN AMERICAN: 80 mL/min/{1.73_m2} (ref >59)
Glucose: 152 mg/dL — ABNORMAL HIGH (ref 65–99)
POTASSIUM: 4.4 mmol/L (ref 3.5–5.2)
Sodium: 138 mmol/L (ref 134–144)

## 2018-11-09 LAB — HEPATIC FUNCTION PANEL
ALBUMIN: 4.5 g/dL (ref 3.5–4.8)
ALT: 29 IU/L (ref 0–44)
AST: 20 IU/L (ref 0–40)
Alkaline Phosphatase: 86 IU/L (ref 39–117)
BILIRUBIN TOTAL: 0.8 mg/dL (ref 0.0–1.2)
BILIRUBIN, DIRECT: 0.26 mg/dL (ref 0.00–0.40)
Total Protein: 6.5 g/dL (ref 6.0–8.5)

## 2018-11-09 LAB — LIPID PANEL
CHOL/HDL RATIO: 3.3 ratio (ref 0.0–5.0)
Cholesterol, Total: 197 mg/dL (ref 100–199)
HDL: 59 mg/dL (ref >39)
LDL CALC: 111 mg/dL — AB (ref 0–99)
TRIGLYCERIDES: 137 mg/dL (ref 0–149)
VLDL CHOLESTEROL CAL: 27 mg/dL (ref 5–40)

## 2018-11-09 LAB — PSA: Prostate Specific Ag, Serum: 1 ng/mL (ref 0.0–4.0)

## 2018-11-09 LAB — HEPATITIS C ANTIBODY: Hep C Virus Ab: <0.1 s/co ratio (ref 0.0–0.9)

## 2018-11-11 ENCOUNTER — Encounter: Payer: Self-pay | Admitting: Family Medicine

## 2018-11-11 ENCOUNTER — Ambulatory Visit (INDEPENDENT_AMBULATORY_CARE_PROVIDER_SITE_OTHER): Payer: Medicare Other | Admitting: Family Medicine

## 2018-11-11 VITALS — BP 132/78 | Ht 66.0 in | Wt 210.0 lb

## 2018-11-11 DIAGNOSIS — E782 Mixed hyperlipidemia: Secondary | ICD-10-CM | POA: Diagnosis not present

## 2018-11-11 DIAGNOSIS — I10 Essential (primary) hypertension: Secondary | ICD-10-CM

## 2018-11-11 NOTE — Progress Notes (Signed)
Subjective:    Patient ID: Miguel Morales, male    DOB: [birth year], 73 y.o.   MRN: [id number]  HPI Patient is here today to follow up on his chronic health issues.He has a history of htn and is taking Amlodipine 10 mg once per day.   He also has a history of Hyperlipidemia and takes Lipitor 80 mg once Qhs. He states she eats,but not always healthy, he does not get much exercise due to back pain.  He sees Dr Ellene Route for this and has several epidurals in the past few months he has a follow up with him January 15.   [facility] [place] and has an appt with him in Feb.  He recently had some blood drawn on 11/08/2018 and wanted to go over these today.   Review of Systems  Constitutional: Negative for diaphoresis and fatigue.  HENT: Negative for congestion and rhinorrhea.   Respiratory: Negative for cough and shortness of breath.   Cardiovascular: Negative for chest pain and leg swelling.  Gastrointestinal: Negative for abdominal pain and diarrhea.  Skin: Negative for color change and rash.  Neurological: Negative for dizziness and headaches.  Psychiatric/Behavioral: Negative for behavioral problems and confusion.       Objective:   Physical Exam Vitals signs reviewed.  Constitutional:      General: He is not in acute distress. HENT:     Head: Normocephalic and atraumatic.  Eyes:     General:        Right eye: No discharge.        Left eye: No discharge.  Neck:     Trachea: No tracheal deviation.  Cardiovascular:     Rate and Rhythm: Normal rate and regular rhythm.     Heart sounds: Normal heart sounds. No murmur.  Pulmonary:     Effort: Pulmonary effort is normal. No respiratory distress.     Breath sounds: Normal breath sounds.  Lymphadenopathy:     Cervical: No cervical adenopathy.  Skin:    General: Skin is warm and dry.  Neurological:     Mental Status: He is alert.     Coordination: Coordination normal.  Psychiatric:        Behavior:  Behavior normal.           Assessment & Plan:  HTN- Patient was seen today as part of a visit regarding hypertension. The importance of healthy diet and regular physical activity was discussed. The importance of compliance with medications discussed.  Ideal goal is to keep blood pressure low elevated levels certainly below 937/16 when possible.  The patient was counseled that keeping blood pressure under control lessen his risk of complications.  The importance of regular follow-ups was discussed with the patient.  Low-salt diet such as DASH recommended.  Regular physical activity was recommended as well.  Patient was advised to keep regular follow-ups.  The patient was seen today as part of an evaluation regarding hyperlipidemia.  Recent lab work has been reviewed with the patient as well as the goals for good cholesterol care.  In addition to this medications have been discussed the importance of compliance with diet and medications discussed as well.  Finally the patient is aware that poor control of cholesterol, noncompliance can dramatically increase the risk of complications. The patient will keep regular office visits and the patient does agreed to periodic lab work.  Patient with prediabetes watching starches and sugars in the diet closely continue  current measures  Follow-up 6 months  He follows up with his specialist on a yearly basis for the previous lung cancer

## 2018-11-14 DIAGNOSIS — J209 Acute bronchitis, unspecified: Secondary | ICD-10-CM | POA: Diagnosis not present

## 2018-11-14 DIAGNOSIS — R05 Cough: Secondary | ICD-10-CM | POA: Diagnosis not present

## 2018-11-23 DIAGNOSIS — M48062 Spinal stenosis, lumbar region with neurogenic claudication: Secondary | ICD-10-CM | POA: Diagnosis not present

## 2018-11-23 DIAGNOSIS — M4316 Spondylolisthesis, lumbar region: Secondary | ICD-10-CM | POA: Diagnosis not present

## 2018-12-09 ENCOUNTER — Other Ambulatory Visit: Payer: Self-pay | Admitting: Neurological Surgery

## 2018-12-19 DIAGNOSIS — J0100 Acute maxillary sinusitis, unspecified: Secondary | ICD-10-CM | POA: Diagnosis not present

## 2018-12-19 DIAGNOSIS — J209 Acute bronchitis, unspecified: Secondary | ICD-10-CM | POA: Diagnosis not present

## 2018-12-30 NOTE — Pre-Procedure Instructions (Signed)
Miguel Morales  12/30/2018      [place], [address] [place] [address] Phone: [phone] Fax: [phone]  [place], [place], [place], [place] [address] [place] [address] Phone: [phone] Fax: [phone]  [facility] [place], [place] [address] [place] [address] Phone: [phone] Fax: [place], [place]. [address] [place] [address] Phone: [phone] Fax: [place], [place] [phone] [place] [address] Phone: [phone] Fax: [phone]    Your procedure is scheduled on Friday February 28th.  Report to [facility] Admitting at 5:30 A.M.  Call this number if you have problems the morning of surgery:  [phone]   Remember:  Do not eat or drink after midnight.     Take these medicines the morning of surgery with A SIP OF WATER  amLODipine (NORVASC) budesonide-formoterol (SYMBICORT) SPIRIVA HANDIHALER  acetaminophen (TYLENOL) if needed albuterol (PROVENTIL HFA;VENTOLIN HFA) if needed azelastine (ASTELIN) if needed Carboxymethylcellulose Sod PF (THERATEARS) if needed fluticasone (FLONASE) if needed HYDROcodone-acetaminophen (NORCO/VICODIN) if needed loratadine (CLARITIN) if needed omeprazole (PRILOSEC) if needed  Follow your surgeon's instructions on when to stop Asprin.  If no instructions were given by your surgeon then you will need to call the office to get those instructions.    7 days prior to surgery STOP taking any Aspirin(unless otherwise instructed by your surgeon), Aleve, Naproxen, Ibuprofen, Motrin, Advil, Goody's, BC's, all herbal medications, fish oil, and all vitamins     Do  not wear jewelry  Do not wear lotions, powders, or colognes, or deodorant.  Do not shave 48 hours prior to surgery.    Do not bring valuables to the hospital.  [facility] is not responsible for any belongings or valuables.  Contacts, dentures or bridgework may not be worn into surgery.  Leave your suitcase in the car.  After surgery it may be brought to your room.  For patients admitted to the hospital, discharge time will be determined by your treatment team.  Patients discharged the day of surgery will not be allowed to drive home.   [place]- Preparing For Surgery  Before surgery, you can play an important role. Because skin is not sterile, your skin needs to be as free of germs as possible. You can reduce the number of germs on your skin by washing with CHG (chlorahexidine gluconate) Soap before surgery.  CHG is an antiseptic cleaner which kills germs and bonds with the skin to continue killing germs even after washing.    Oral Hygiene is also important to reduce your risk of infection.  Remember - BRUSH YOUR TEETH THE MORNING OF SURGERY WITH YOUR REGULAR TOOTHPASTE  Please do not use if you have an allergy to CHG or antibacterial soaps. If your skin becomes reddened/irritated stop using the CHG.  Do not shave (including legs and underarms) for at least 48 hours prior to first CHG shower. It is OK to shave your face.  Please follow these instructions carefully.   1. Shower the NIGHT BEFORE SURGERY and the MORNING OF SURGERY with CHG.   2. If you chose to wash your hair, wash your hair first as  usual with your normal shampoo.  3. After you shampoo, rinse your hair and body thoroughly to remove the shampoo.  4. Use CHG as you would any other liquid soap. You can apply CHG directly to the skin and wash gently with a scrungie or a clean washcloth.   5. Apply the CHG Soap to your body ONLY FROM THE NECK DOWN.  Do not use on open wounds or open sores. Avoid contact with your eyes,  ears, mouth and genitals (private parts). Wash Face and genitals (private parts)  with your normal soap.  6. Wash thoroughly, paying special attention to the area where your surgery will be performed.  7. Thoroughly rinse your body with warm water from the neck down.  8. DO NOT shower/wash with your normal soap after using and rinsing off the CHG Soap.  9. Pat yourself dry with a CLEAN TOWEL.  10. Wear CLEAN PAJAMAS to bed the night before surgery, wear comfortable clothes the morning of surgery  11. Place CLEAN SHEETS on your bed the night of your first shower and DO NOT SLEEP WITH PETS.    Day of Surgery: Shower as stated above. Do not apply any deodorants/lotions.  Please wear clean clothes to the hospital/surgery center.   Remember to brush your teeth WITH YOUR REGULAR TOOTHPASTE.   Please read over the following fact sheets that you were given.

## 2019-01-02 ENCOUNTER — Telehealth: Payer: Self-pay | Admitting: Family Medicine

## 2019-01-02 ENCOUNTER — Encounter (HOSPITAL_COMMUNITY)
Admission: RE | Admit: 2019-01-02 | Discharge: 2019-01-02 | Disposition: A | Discharge status: Home / Self Care | Payer: Medicare Other | Source: Ambulatory Visit | Attending: Neurological Surgery | Admitting: Neurological Surgery

## 2019-01-02 ENCOUNTER — Encounter (HOSPITAL_COMMUNITY): Payer: Self-pay

## 2019-01-02 DIAGNOSIS — Z01818 Encounter for other preprocedural examination: Secondary | ICD-10-CM | POA: Diagnosis not present

## 2019-01-02 DIAGNOSIS — I10 Essential (primary) hypertension: Secondary | ICD-10-CM | POA: Diagnosis not present

## 2019-01-02 LAB — CBC
HCT: 41 % (ref 39.0–52.0)
Hemoglobin: 13.2 g/dL (ref 13.0–17.0)
MCH: 31.6 pg (ref 26.0–34.0)
MCHC: 32.2 g/dL (ref 30.0–36.0)
MCV: 98.1 fL (ref 80.0–100.0)
PLATELETS: 272 10*3/uL (ref 150–400)
RBC: 4.18 MIL/uL — ABNORMAL LOW (ref 4.22–5.81)
RDW: 13.2 % (ref 11.5–15.5)
WBC: 7.8 10*3/uL (ref 4.0–10.5)
nRBC: 0 % (ref 0.0–0.2)

## 2019-01-02 LAB — BASIC METABOLIC PANEL
Anion gap: 9 (ref 5–15)
BUN: 11 mg/dL (ref 8–23)
CO2: 25 mmol/L (ref 22–32)
Calcium: 9.7 mg/dL (ref 8.9–10.3)
Chloride: 105 mmol/L (ref 98–111)
Creatinine, Ser: 1.01 mg/dL (ref 0.61–1.24)
GFR calc Af Amer: >60 mL/min (ref >60)
Glucose, Bld: 94 mg/dL (ref 70–99)
Potassium: 3.5 mmol/L (ref 3.5–5.1)
Sodium: 139 mmol/L (ref 135–145)

## 2019-01-02 LAB — TYPE AND SCREEN
ABO/RH(D): O POS
Antibody Screen: NEGATIVE

## 2019-01-02 LAB — SURGICAL PCR SCREEN
MRSA, PCR: NEGATIVE
Staphylococcus aureus: NEGATIVE

## 2019-01-02 MED ORDER — CHLORHEXIDINE GLUCONATE CLOTH 2 % EX PADS: 6.0000 | MEDICATED_PAD | Freq: Once | CUTANEOUS | Status: DC

## 2019-01-02 NOTE — Telephone Encounter (Signed)
Pt calling to make Dr. Nicki Reaper aware he has back surgery set up for 2/28 at [facility]

## 2019-01-02 NOTE — Progress Notes (Signed)
PCP - Dr Pierre Bali Cardiologist - stated he saw DR Mcdowell approx 45yrs ago  Chest x-ray - 7/19 EKG - 01/02/19 Stress Test - 4/15 ECHO - 3/17 Cardiac Cath - na  Sleep Study - yes CPAP - yes  F   Blood Thinner Instructions:na Aspirin Instructions:stop x7days  Anesthesia review: pt does not have clearance.But he will call Dr Karin Golden to inform him  Patient denies shortness of breath, fever, cough and chest pain at PAT appointment   Patient verbalized understanding of instructions that were given to them at the PAT appointment. Patient was also instructed that they will need to review over the PAT instructions again at home before surgery.

## 2019-01-04 NOTE — Telephone Encounter (Signed)
I did discuss it with the patient he has surgery coming up I told him if there is any thing we can do to help out to let us know

## 2019-01-05 NOTE — Anesthesia Preprocedure Evaluation (Addendum)
Anesthesia Evaluation  Patient identified by MRN, date of birth, ID band Patient awake    Reviewed: Allergy & Precautions, NPO status , Patient's Chart, lab work & pertinent test results  History of Anesthesia Complications Negative for: history of anesthetic complications  Airway Mallampati: II  TM Distance: >3 FB Neck ROM: Full    Dental  (+) Teeth Intact, Dental Advisory Given, Caps   Pulmonary asthma , sleep apnea and Continuous Positive Airway Pressure Ventilation , COPD,  COPD inhaler, former smoker,   Hx lung cancer s/p wedge resection    Pulmonary exam normal breath sounds clear to auscultation       Cardiovascular hypertension, Pt. on medications (-) angina(-) CAD, (-) Past MI and (-) Cardiac Stents Normal cardiovascular exam Rhythm:Regular Rate:Normal   '17 TTE - Mild LVH. EF 65% to 70%. Grade 1 diastolic dysfunction. Trivial MR. Physiologic TR.     Neuro/Psych  Spinal stenosis  negative psych ROS   GI/Hepatic Neg liver ROS, GERD  Medicated and Controlled,  Endo/Other   Obesity   Renal/GU negative Renal ROS     Musculoskeletal  (+) Arthritis ,   Abdominal   Peds  Hematology negative hematology ROS (+)   Anesthesia Other Findings   Reproductive/Obstetrics                            Anesthesia Physical Anesthesia Plan  ASA: III  Anesthesia Plan: General   Post-op Pain Management:    Induction: Intravenous  PONV Risk Score and Plan: 3 and Treatment may vary due to age or medical condition, Ondansetron and Dexamethasone  Airway Management Planned: Oral ETT  Additional Equipment: None  Intra-op Plan:   Post-operative Plan: Extubation in OR  Informed Consent: I have reviewed the patients History and Physical, chart, labs and discussed the procedure including the risks, benefits and alternatives for the proposed anesthesia with the patient or authorized  representative who has indicated his/her understanding and acceptance.     Dental advisory given  Plan Discussed with: CRNA and Anesthesiologist  Anesthesia Plan Comments:        Anesthesia Quick Evaluation

## 2019-01-06 ENCOUNTER — Encounter (HOSPITAL_COMMUNITY): Payer: Self-pay | Admitting: *Deleted

## 2019-01-06 ENCOUNTER — Inpatient Hospital Stay (HOSPITAL_COMMUNITY)
Admission: RE | Admit: 2019-01-06 | Discharge: 2019-01-08 | DRG: 455 | Disposition: A | Discharge status: Home / Self Care | Payer: Medicare Other | Attending: Neurological Surgery | Admitting: Neurological Surgery

## 2019-01-06 ENCOUNTER — Inpatient Hospital Stay (HOSPITAL_COMMUNITY): Payer: Medicare Other

## 2019-01-06 ENCOUNTER — Encounter (HOSPITAL_COMMUNITY)
Admission: RE | Disposition: A | Discharge status: Home / Self Care | Payer: Self-pay | Source: Home / Self Care | Attending: Neurological Surgery

## 2019-01-06 ENCOUNTER — Inpatient Hospital Stay (HOSPITAL_COMMUNITY): Payer: Medicare Other | Admitting: Anesthesiology

## 2019-01-06 DIAGNOSIS — Z87891 Personal history of nicotine dependence: Secondary | ICD-10-CM

## 2019-01-06 DIAGNOSIS — J449 Chronic obstructive pulmonary disease, unspecified: Secondary | ICD-10-CM | POA: Diagnosis present

## 2019-01-06 DIAGNOSIS — M4326 Fusion of spine, lumbar region: Secondary | ICD-10-CM | POA: Diagnosis not present

## 2019-01-06 DIAGNOSIS — Z91048 Other nonmedicinal substance allergy status: Secondary | ICD-10-CM

## 2019-01-06 DIAGNOSIS — Z419 Encounter for procedure for purposes other than remedying health state, unspecified: Secondary | ICD-10-CM

## 2019-01-06 DIAGNOSIS — Z79899 Other long term (current) drug therapy: Secondary | ICD-10-CM

## 2019-01-06 DIAGNOSIS — K219 Gastro-esophageal reflux disease without esophagitis: Secondary | ICD-10-CM | POA: Diagnosis present

## 2019-01-06 DIAGNOSIS — M48061 Spinal stenosis, lumbar region without neurogenic claudication: Secondary | ICD-10-CM | POA: Diagnosis present

## 2019-01-06 DIAGNOSIS — Z882 Allergy status to sulfonamides status: Secondary | ICD-10-CM | POA: Diagnosis not present

## 2019-01-06 DIAGNOSIS — M5416 Radiculopathy, lumbar region: Secondary | ICD-10-CM | POA: Diagnosis present

## 2019-01-06 DIAGNOSIS — M4316 Spondylolisthesis, lumbar region: Secondary | ICD-10-CM | POA: Diagnosis present

## 2019-01-06 DIAGNOSIS — M4306 Spondylolysis, lumbar region: Secondary | ICD-10-CM | POA: Diagnosis not present

## 2019-01-06 DIAGNOSIS — M48062 Spinal stenosis, lumbar region with neurogenic claudication: Secondary | ICD-10-CM

## 2019-01-06 DIAGNOSIS — E782 Mixed hyperlipidemia: Secondary | ICD-10-CM | POA: Diagnosis present

## 2019-01-06 DIAGNOSIS — I10 Essential (primary) hypertension: Secondary | ICD-10-CM | POA: Diagnosis present

## 2019-01-06 DIAGNOSIS — G4730 Sleep apnea, unspecified: Secondary | ICD-10-CM | POA: Diagnosis present

## 2019-01-06 SURGERY — POSTERIOR LUMBAR FUSION 1 LEVEL
Anesthesia: General | Site: Spine Lumbar

## 2019-01-06 MED ORDER — FLUTICASONE PROPIONATE 50 MCG/ACT NA SUSP: 1.0000 | Freq: Every day | NASAL | Status: DC | PRN

## 2019-01-06 MED ORDER — METHOCARBAMOL 500 MG PO TABS: 500.0000 mg | ORAL_TABLET | Freq: Four times a day (QID) | ORAL | Status: DC | PRN

## 2019-01-06 MED ORDER — PHENYLEPHRINE 40 MCG/ML (10ML) SYRINGE FOR IV PUSH (FOR BLOOD PRESSURE SUPPORT)
PREFILLED_SYRINGE | INTRAVENOUS | Status: DC | PRN
  Administered 2019-01-06: 160 ug via INTRAVENOUS
  Administered 2019-01-06: 120 ug via INTRAVENOUS
  Administered 2019-01-06: 80 ug via INTRAVENOUS

## 2019-01-06 MED ORDER — PROPOFOL 10 MG/ML IV BOLUS
INTRAVENOUS | Status: AC
  Filled 2019-01-06: qty 40

## 2019-01-06 MED ORDER — FENTANYL CITRATE (PF) 250 MCG/5ML IJ SOLN
INTRAMUSCULAR | Status: AC
  Filled 2019-01-06: qty 5

## 2019-01-06 MED ORDER — FENTANYL CITRATE (PF) 100 MCG/2ML IJ SOLN: 25.0000 ug | INTRAMUSCULAR | Status: DC | PRN

## 2019-01-06 MED ORDER — TIOTROPIUM BROMIDE MONOHYDRATE 18 MCG IN CAPS: 18.0000 ug | ORAL_CAPSULE | Freq: Every day | RESPIRATORY_TRACT | Status: DC

## 2019-01-06 MED ORDER — PHENOL 1.4 % MT LIQD: 1.0000 | OROMUCOSAL | Status: DC | PRN

## 2019-01-06 MED ORDER — KETOROLAC TROMETHAMINE 15 MG/ML IJ SOLN
INTRAMUSCULAR | Status: AC
  Filled 2019-01-06: qty 1

## 2019-01-06 MED ORDER — THROMBIN 20000 UNITS EX SOLR
CUTANEOUS | Status: DC | PRN
  Administered 2019-01-06: 20 mL via TOPICAL

## 2019-01-06 MED ORDER — BUPIVACAINE HCL (PF) 0.5 % IJ SOLN
INTRAMUSCULAR | Status: AC
  Filled 2019-01-06: qty 30

## 2019-01-06 MED ORDER — MIDAZOLAM HCL 2 MG/2ML IJ SOLN
INTRAMUSCULAR | Status: AC
  Filled 2019-01-06: qty 2

## 2019-01-06 MED ORDER — HYDROXYZINE HCL 10 MG PO TABS
10.0000 mg | ORAL_TABLET | Freq: Every day | ORAL | Status: DC
  Administered 2019-01-06 – 2019-01-07 (×2): 10 mg via ORAL
  Filled 2019-01-06 (×3): qty 1

## 2019-01-06 MED ORDER — SODIUM CHLORIDE 0.9% FLUSH: 3.0000 mL | INTRAVENOUS | Status: DC | PRN

## 2019-01-06 MED ORDER — HYDROCODONE-ACETAMINOPHEN 5-325 MG PO TABS: 1.0000 | ORAL_TABLET | ORAL | Status: DC | PRN

## 2019-01-06 MED ORDER — UMECLIDINIUM BROMIDE 62.5 MCG/INH IN AEPB
1.0000 | INHALATION_SPRAY | Freq: Every day | RESPIRATORY_TRACT | Status: DC
  Administered 2019-01-07 – 2019-01-08 (×2): 1 via RESPIRATORY_TRACT
  Filled 2019-01-06: qty 7

## 2019-01-06 MED ORDER — ROCURONIUM BROMIDE 50 MG/5ML IV SOSY
PREFILLED_SYRINGE | INTRAVENOUS | Status: AC
  Filled 2019-01-06: qty 15

## 2019-01-06 MED ORDER — ACETAMINOPHEN 10 MG/ML IV SOLN
INTRAVENOUS | Status: DC | PRN
  Administered 2019-01-06: 1000 mg via INTRAVENOUS

## 2019-01-06 MED ORDER — OXYCODONE HCL 5 MG/5ML PO SOLN: 5.0000 mg | Freq: Once | ORAL | Status: DC | PRN

## 2019-01-06 MED ORDER — LIDOCAINE 2% (20 MG/ML) 5 ML SYRINGE
INTRAMUSCULAR | Status: AC
  Filled 2019-01-06: qty 5

## 2019-01-06 MED ORDER — MENTHOL 3 MG MT LOZG: 1.0000 | LOZENGE | OROMUCOSAL | Status: DC | PRN

## 2019-01-06 MED ORDER — THROMBIN 20000 UNITS EX SOLR
CUTANEOUS | Status: AC
  Filled 2019-01-06: qty 20000

## 2019-01-06 MED ORDER — DOCUSATE SODIUM 100 MG PO CAPS
100.0000 mg | ORAL_CAPSULE | Freq: Two times a day (BID) | ORAL | Status: DC
  Administered 2019-01-06 – 2019-01-08 (×3): 100 mg via ORAL
  Filled 2019-01-06 (×4): qty 1

## 2019-01-06 MED ORDER — ACETAMINOPHEN 325 MG PO TABS
650.0000 mg | ORAL_TABLET | ORAL | Status: DC | PRN
  Administered 2019-01-07 – 2019-01-08 (×2): 650 mg via ORAL
  Filled 2019-01-06 (×2): qty 2

## 2019-01-06 MED ORDER — KETOROLAC TROMETHAMINE 15 MG/ML IJ SOLN
7.5000 mg | Freq: Four times a day (QID) | INTRAMUSCULAR | Status: AC
  Administered 2019-01-06 – 2019-01-07 (×4): 7.5 mg via INTRAVENOUS
  Filled 2019-01-06 (×3): qty 1

## 2019-01-06 MED ORDER — DEXAMETHASONE SODIUM PHOSPHATE 10 MG/ML IJ SOLN
INTRAMUSCULAR | Status: DC | PRN
  Administered 2019-01-06: 10 mg via INTRAVENOUS

## 2019-01-06 MED ORDER — 0.9 % SODIUM CHLORIDE (POUR BTL) OPTIME
TOPICAL | Status: DC | PRN
  Administered 2019-01-06: 1000 mL

## 2019-01-06 MED ORDER — SODIUM CHLORIDE 0.9 % IV SOLN
INTRAVENOUS | Status: DC | PRN
  Administered 2019-01-06: 20 ug/min via INTRAVENOUS

## 2019-01-06 MED ORDER — OXYCODONE HCL 5 MG PO TABS: 5.0000 mg | ORAL_TABLET | Freq: Once | ORAL | Status: DC | PRN

## 2019-01-06 MED ORDER — POLYETHYLENE GLYCOL 3350 17 G PO PACK: 17.0000 g | PACK | Freq: Every day | ORAL | Status: DC | PRN

## 2019-01-06 MED ORDER — FLEET ENEMA 7-19 GM/118ML RE ENEM: 1.0000 | ENEMA | Freq: Once | RECTAL | Status: DC | PRN

## 2019-01-06 MED ORDER — CEFAZOLIN SODIUM-DEXTROSE 2-4 GM/100ML-% IV SOLN
2.0000 g | INTRAVENOUS | Status: AC
  Administered 2019-01-06: 2 g via INTRAVENOUS
  Filled 2019-01-06: qty 100

## 2019-01-06 MED ORDER — PROPOFOL 10 MG/ML IV BOLUS
INTRAVENOUS | Status: DC | PRN
  Administered 2019-01-06: 150 mg via INTRAVENOUS

## 2019-01-06 MED ORDER — LACTATED RINGERS IV SOLN: INTRAVENOUS | Status: DC

## 2019-01-06 MED ORDER — ONDANSETRON HCL 4 MG/2ML IJ SOLN: 4.0000 mg | Freq: Once | INTRAMUSCULAR | Status: DC | PRN

## 2019-01-06 MED ORDER — THROMBIN 5000 UNITS EX SOLR
CUTANEOUS | Status: AC
  Filled 2019-01-06: qty 5000

## 2019-01-06 MED ORDER — ONDANSETRON HCL 4 MG/2ML IJ SOLN
INTRAMUSCULAR | Status: AC
  Filled 2019-01-06: qty 2

## 2019-01-06 MED ORDER — PANTOPRAZOLE SODIUM 40 MG PO TBEC: 40.0000 mg | DELAYED_RELEASE_TABLET | Freq: Every day | ORAL | Status: DC

## 2019-01-06 MED ORDER — THROMBIN 5000 UNITS EX SOLR
OROMUCOSAL | Status: DC | PRN
  Administered 2019-01-06: 5 mL via TOPICAL

## 2019-01-06 MED ORDER — MAGNESIUM OXIDE 400 (241.3 MG) MG PO TABS
400.0000 mg | ORAL_TABLET | Freq: Every day | ORAL | Status: DC
  Administered 2019-01-07 – 2019-01-08 (×2): 400 mg via ORAL
  Filled 2019-01-06 (×2): qty 1

## 2019-01-06 MED ORDER — SENNA 8.6 MG PO TABS
1.0000 | ORAL_TABLET | Freq: Two times a day (BID) | ORAL | Status: DC
  Administered 2019-01-06 – 2019-01-08 (×3): 8.6 mg via ORAL
  Filled 2019-01-06 (×4): qty 1

## 2019-01-06 MED ORDER — LACTATED RINGERS IV SOLN
INTRAVENOUS | Status: DC | PRN
  Administered 2019-01-06 (×2): via INTRAVENOUS

## 2019-01-06 MED ORDER — ACETAMINOPHEN 10 MG/ML IV SOLN
INTRAVENOUS | Status: AC
  Filled 2019-01-06: qty 100

## 2019-01-06 MED ORDER — SUGAMMADEX SODIUM 200 MG/2ML IV SOLN
INTRAVENOUS | Status: DC | PRN
  Administered 2019-01-06: 200 mg via INTRAVENOUS

## 2019-01-06 MED ORDER — FENTANYL CITRATE (PF) 250 MCG/5ML IJ SOLN
INTRAMUSCULAR | Status: DC | PRN
  Administered 2019-01-06 (×6): 50 ug via INTRAVENOUS

## 2019-01-06 MED ORDER — GABAPENTIN 100 MG PO CAPS
200.0000 mg | ORAL_CAPSULE | Freq: Every day | ORAL | Status: DC
  Administered 2019-01-06: 300 mg via ORAL
  Administered 2019-01-07: 200 mg via ORAL
  Filled 2019-01-06 (×2): qty 2

## 2019-01-06 MED ORDER — ACETAMINOPHEN 650 MG RE SUPP: 650.0000 mg | RECTAL | Status: DC | PRN

## 2019-01-06 MED ORDER — AZELASTINE HCL 0.1 % NA SOLN: 1.0000 | Freq: Two times a day (BID) | NASAL | Status: DC | PRN

## 2019-01-06 MED ORDER — AMLODIPINE BESYLATE 10 MG PO TABS
10.0000 mg | ORAL_TABLET | Freq: Every day | ORAL | Status: DC
  Administered 2019-01-07 – 2019-01-08 (×2): 10 mg via ORAL
  Filled 2019-01-06 (×2): qty 1

## 2019-01-06 MED ORDER — MOMETASONE FURO-FORMOTEROL FUM 200-5 MCG/ACT IN AERO
2.0000 | INHALATION_SPRAY | Freq: Two times a day (BID) | RESPIRATORY_TRACT | Status: DC
  Administered 2019-01-06 – 2019-01-08 (×4): 2 via RESPIRATORY_TRACT
  Filled 2019-01-06: qty 8.8

## 2019-01-06 MED ORDER — BUPIVACAINE HCL (PF) 0.5 % IJ SOLN
INTRAMUSCULAR | Status: DC | PRN
  Administered 2019-01-06: 25 mL
  Administered 2019-01-06: 5 mL

## 2019-01-06 MED ORDER — DEXAMETHASONE SODIUM PHOSPHATE 10 MG/ML IJ SOLN
INTRAMUSCULAR | Status: AC
  Filled 2019-01-06: qty 1

## 2019-01-06 MED ORDER — MORPHINE SULFATE (PF) 2 MG/ML IV SOLN: 2.0000 mg | INTRAVENOUS | Status: DC | PRN

## 2019-01-06 MED ORDER — LIDOCAINE 2% (20 MG/ML) 5 ML SYRINGE
INTRAMUSCULAR | Status: DC | PRN
  Administered 2019-01-06: 80 mg via INTRAVENOUS

## 2019-01-06 MED ORDER — SODIUM CHLORIDE 0.9 % IV SOLN
INTRAVENOUS | Status: DC | PRN
  Administered 2019-01-06: 500 mL

## 2019-01-06 MED ORDER — LIDOCAINE-EPINEPHRINE 1 %-1:100000 IJ SOLN
INTRAMUSCULAR | Status: AC
  Filled 2019-01-06: qty 1

## 2019-01-06 MED ORDER — SODIUM CHLORIDE 0.9% FLUSH
3.0000 mL | Freq: Two times a day (BID) | INTRAVENOUS | Status: DC
  Administered 2019-01-06 – 2019-01-08 (×4): 3 mL via INTRAVENOUS

## 2019-01-06 MED ORDER — SODIUM CHLORIDE 0.9 % IV SOLN: 250.0000 mL | INTRAVENOUS | Status: DC

## 2019-01-06 MED ORDER — ALBUTEROL SULFATE (2.5 MG/3ML) 0.083% IN NEBU: 3.0000 mL | INHALATION_SOLUTION | Freq: Four times a day (QID) | RESPIRATORY_TRACT | Status: DC | PRN

## 2019-01-06 MED ORDER — ROCURONIUM BROMIDE 50 MG/5ML IV SOSY
PREFILLED_SYRINGE | INTRAVENOUS | Status: DC | PRN
  Administered 2019-01-06: 20 mg via INTRAVENOUS
  Administered 2019-01-06: 50 mg via INTRAVENOUS
  Administered 2019-01-06: 20 mg via INTRAVENOUS
  Administered 2019-01-06: 10 mg via INTRAVENOUS
  Administered 2019-01-06: 20 mg via INTRAVENOUS

## 2019-01-06 MED ORDER — ONDANSETRON HCL 4 MG/2ML IJ SOLN: 4.0000 mg | Freq: Four times a day (QID) | INTRAMUSCULAR | Status: DC | PRN

## 2019-01-06 MED ORDER — ATORVASTATIN CALCIUM 80 MG PO TABS
80.0000 mg | ORAL_TABLET | Freq: Every day | ORAL | Status: DC
  Administered 2019-01-06 – 2019-01-07 (×2): 80 mg via ORAL
  Filled 2019-01-06 (×2): qty 1

## 2019-01-06 MED ORDER — CARBOXYMETHYLCELLULOSE SOD PF 1 % OP GEL: 1.0000 [drp] | OPHTHALMIC | Status: DC | PRN

## 2019-01-06 MED ORDER — LIDOCAINE-EPINEPHRINE 1 %-1:100000 IJ SOLN
INTRAMUSCULAR | Status: DC | PRN
  Administered 2019-01-06: 5 mL

## 2019-01-06 MED ORDER — BISACODYL 10 MG RE SUPP: 10.0000 mg | Freq: Every day | RECTAL | Status: DC | PRN

## 2019-01-06 MED ORDER — ONDANSETRON HCL 4 MG PO TABS: 4.0000 mg | ORAL_TABLET | Freq: Four times a day (QID) | ORAL | Status: DC | PRN

## 2019-01-06 MED ORDER — ONDANSETRON HCL 4 MG/2ML IJ SOLN
INTRAMUSCULAR | Status: DC | PRN
  Administered 2019-01-06: 4 mg via INTRAVENOUS

## 2019-01-06 MED ORDER — METHOCARBAMOL 1000 MG/10ML IJ SOLN
500.0000 mg | Freq: Four times a day (QID) | INTRAVENOUS | Status: DC | PRN
  Filled 2019-01-06: qty 5

## 2019-01-06 MED ORDER — LORATADINE 10 MG PO TABS: 10.0000 mg | ORAL_TABLET | Freq: Every day | ORAL | Status: DC | PRN

## 2019-01-06 MED ORDER — ALBUMIN HUMAN 5 % IV SOLN
INTRAVENOUS | Status: DC | PRN
  Administered 2019-01-06 (×2): via INTRAVENOUS

## 2019-01-06 SURGICAL SUPPLY — 67 items
ADH SKN CLS APL DERMABOND .7 (GAUZE/BANDAGES/DRESSINGS) ×1
APL SRG 60D 8 XTD TIP BNDBL (TIP)
BAG DECANTER FOR FLEXI CONT (MISCELLANEOUS) ×3 IMPLANT
BASKET BONE COLLECTION (BASKET) ×3 IMPLANT
BLADE CLIPPER SURG (BLADE) ×2 IMPLANT
BONE CANC CHIPS 20CC PCAN1/4 (Bone Implant) ×3 IMPLANT
BUR MATCHSTICK NEURO 3.0 LAGG (BURR) ×3 IMPLANT
CAGE PLIF 8X9X23-12 LUMBAR (Cage) ×4 IMPLANT
CANISTER SUCT 3000ML PPV (MISCELLANEOUS) ×3 IMPLANT
CHIPS CANC BONE 20CC PCAN1/4 (Bone Implant) ×1 IMPLANT
CONT SPEC 4OZ CLIKSEAL STRL BL (MISCELLANEOUS) ×3 IMPLANT
COVER BACK TABLE 60X90IN (DRAPES) ×3 IMPLANT
COVER WAND RF STERILE (DRAPES) ×1 IMPLANT
DECANTER SPIKE VIAL GLASS SM (MISCELLANEOUS) ×1 IMPLANT
DERMABOND ADVANCED (GAUZE/BANDAGES/DRESSINGS) ×2
DERMABOND ADVANCED .7 DNX12 (GAUZE/BANDAGES/DRESSINGS) ×1 IMPLANT
DEVICE DISSECT PLASMABLAD 3.0S (MISCELLANEOUS) ×1 IMPLANT
DRAPE C-ARM 42X72 X-RAY (DRAPES) ×8 IMPLANT
DRAPE HALF SHEET 40X57 (DRAPES) IMPLANT
DRAPE LAPAROTOMY 100X72X124 (DRAPES) ×3 IMPLANT
DURAPREP 26ML APPLICATOR (WOUND CARE) ×3 IMPLANT
DURASEAL APPLICATOR TIP (TIP) IMPLANT
DURASEAL SPINE SEALANT 3ML (MISCELLANEOUS) IMPLANT
ELECT REM PT RETURN 9FT ADLT (ELECTROSURGICAL) ×3
ELECTRODE REM PT RTRN 9FT ADLT (ELECTROSURGICAL) ×1 IMPLANT
GAUZE 4X4 16PLY RFD (DISPOSABLE) IMPLANT
GAUZE SPONGE 4X4 12PLY STRL (GAUZE/BANDAGES/DRESSINGS) ×1 IMPLANT
GLOVE BIO SURGEON STRL SZ7 (GLOVE) ×2 IMPLANT
GLOVE BIO SURGEON STRL SZ8 (GLOVE) ×2 IMPLANT
GLOVE BIOGEL PI IND STRL 8 (GLOVE) IMPLANT
GLOVE BIOGEL PI IND STRL 8.5 (GLOVE) ×2 IMPLANT
GLOVE BIOGEL PI INDICATOR 8 (GLOVE) ×4
GLOVE BIOGEL PI INDICATOR 8.5 (GLOVE) ×6
GLOVE ECLIPSE 7.5 STRL STRAW (GLOVE) ×6 IMPLANT
GLOVE ECLIPSE 8.5 STRL (GLOVE) ×6 IMPLANT
GOWN STRL REUS W/ TWL LRG LVL3 (GOWN DISPOSABLE) IMPLANT
GOWN STRL REUS W/ TWL XL LVL3 (GOWN DISPOSABLE) IMPLANT
GOWN STRL REUS W/TWL 2XL LVL3 (GOWN DISPOSABLE) ×8 IMPLANT
GOWN STRL REUS W/TWL LRG LVL3 (GOWN DISPOSABLE)
GOWN STRL REUS W/TWL XL LVL3 (GOWN DISPOSABLE) ×3
GRAFT BNE CANC CHIPS 1-8 20CC (Bone Implant) IMPLANT
HEMOSTAT POWDER KIT SURGIFOAM (HEMOSTASIS) ×2 IMPLANT
KIT BASIN OR (CUSTOM PROCEDURE TRAY) ×3 IMPLANT
KIT INFUSE SMALL (Orthopedic Implant) ×2 IMPLANT
KIT TURNOVER KIT B (KITS) ×3 IMPLANT
MILL MEDIUM DISP (BLADE) ×3 IMPLANT
NEEDLE HYPO 22GX1.5 SAFETY (NEEDLE) ×3 IMPLANT
NS IRRIG 1000ML POUR BTL (IV SOLUTION) ×3 IMPLANT
PACK LAMINECTOMY NEURO (CUSTOM PROCEDURE TRAY) ×3 IMPLANT
PAD ARMBOARD 7.5X6 YLW CONV (MISCELLANEOUS) ×13 IMPLANT
PATTIES SURGICAL .5 X1 (DISPOSABLE) ×3 IMPLANT
PLASMABLADE 3.0S (MISCELLANEOUS) ×3
ROD RELINE-O LORD 5.5X35MM (Rod) ×4 IMPLANT
SCREW LOCK RELINE 5.5 TULIP (Screw) ×8 IMPLANT
SCREW RELINE-O POLY 6.5X45 (Screw) ×8 IMPLANT
SPONGE LAP 4X18 RFD (DISPOSABLE) IMPLANT
SPONGE SURGIFOAM ABS GEL 100 (HEMOSTASIS) ×3 IMPLANT
SUT PROLENE 6 0 BV (SUTURE) IMPLANT
SUT VIC AB 1 CT1 18XBRD ANBCTR (SUTURE) ×1 IMPLANT
SUT VIC AB 1 CT1 8-18 (SUTURE) ×6
SUT VIC AB 2-0 CP2 18 (SUTURE) ×5 IMPLANT
SUT VIC AB 3-0 SH 8-18 (SUTURE) ×3 IMPLANT
SYR 3ML LL SCALE MARK (SYRINGE) ×12 IMPLANT
TOWEL GREEN STERILE (TOWEL DISPOSABLE) ×3 IMPLANT
TOWEL GREEN STERILE FF (TOWEL DISPOSABLE) ×3 IMPLANT
TRAY FOLEY MTR SLVR 16FR STAT (SET/KITS/TRAYS/PACK) ×3 IMPLANT
WATER STERILE IRR 1000ML POUR (IV SOLUTION) ×3 IMPLANT

## 2019-01-06 NOTE — Anesthesia Procedure Notes (Signed)
Procedure Name: Intubation Date/Time: 01/06/2019 7:57 AM Performed by: Imagene Riches, CRNA Pre-anesthesia Checklist: Patient identified, Emergency Drugs available, Suction available and Patient being monitored Patient Re-evaluated:Patient Re-evaluated prior to induction Oxygen Delivery Method: Circle System Utilized Preoxygenation: Pre-oxygenation with 100% oxygen Induction Type: IV induction Ventilation: Mask ventilation without difficulty Laryngoscope Size: Miller and 3 Grade View: Grade I Tube type: Oral Tube size: 7.5 mm Number of attempts: 1 Airway Equipment and Method: Stylet and Oral airway Placement Confirmation: ETT inserted through vocal cords under direct vision,  positive ETCO2 and breath sounds checked- equal and bilateral Secured at: 23 cm Tube secured with: Tape Dental Injury: Teeth and Oropharynx as per pre-operative assessment

## 2019-01-06 NOTE — Transfer of Care (Signed)
Immediate Anesthesia Transfer of Care Note  Patient: Miguel Morales  Procedure(s) Performed: Lumbar Four-Five Posterior lumbar interbody fusion (N/A Spine Lumbar)  Patient Location: PACU  Anesthesia Type:General  Level of Consciousness: awake  Airway & Oxygen Therapy: Patient Spontanous Breathing and Patient connected to nasal cannula oxygen  Post-op Assessment: Report given to RN and Post -op Vital signs reviewed and stable  Post vital signs: Reviewed and stable  Last Vitals:  Vitals Value Taken Time  BP 116/62 01/06/2019 11:47 AM  Temp    Pulse 83 01/06/2019 11:48 AM  Resp 14 01/06/2019 11:48 AM  SpO2 98 % 01/06/2019 11:48 AM  Vitals shown include unvalidated device data.  Last Pain:  Vitals:   01/06/19 0612  TempSrc: Oral  PainSc:          Complications: No apparent anesthesia complications

## 2019-01-06 NOTE — Progress Notes (Signed)
Patient ID: Miguel Morales, male   DOB: 1946/08/06, 73 y.o.   MRN: [id number] Vital signs are stable and motor function appears intact after L4-5 decompression for spondylolisthesis with severe stenosis.  Patient notes his preoperative pain is much improved.  We will see how he does with ambulation in the next day or 2 and I discussed plan discharge on Monday unless he feels substantially improved sooner.

## 2019-01-06 NOTE — Op Note (Signed)
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  Posterior lumbar interbody arthrodesis using peek spacers local autograft and allograft and infuse with posterior lateral arthrodesis using local autograft allograft and infuse pedicle screw fixation L4-L5. Surgeon: Kristeen Miss First assistant: Kary Kos MD Anesthesia: General endotracheal Indications: Miguel Morales is a 73 year old individual whose had progressive leg pain and weakness in the lower extremities.  He has a degenerative spondylolisthesis at L4-5 which has been getting progressively worse.  He was advised regarding the need for surgical decompression and stabilization.  Procedure: Patient was brought to the operating room supine on the stretcher.  After the smooth induction of general endotracheal anesthesia, he was turned prone.  The back was prepped with alcohol DuraPrep and draped in a sterile fashion.  Midline incision was created in the lower lumbar spine and carried down to the lumbodorsal fascia.  First identifiable spinous processes in the interspace was that of L4-L5.  Then by creating a large laminectomy over L4 both laminar arches and the facet joints at L4-5 were removed.  This allowed exposure of the common dural tube.  There is no to be thickened and redundant yellow ligament in this region causing severe stenosis.  This was taken up and allowed for good decompression of the common dural tube and the takeoff of the L4 nerve root superiorly and the L5 nerve root inferiorly.  These were each individually decompressed using small Kerrison punches and a high-speed drill to remove additional bone.  On the left side there was noted to be substantial overgrowth from a chronically herniated  disc that was compressing the L4 nerve root superiorly.  This was carefully dissected free and scar tissue from the nerve itself was removed carefully.  In the end both L4 and L5 nerve roots were decompressed bilaterally.  The space was isolated.  The space was entered and a substantial quantity of severely degenerated and desiccated disc material was removed from that the space at L4-L5.  A series of spreaders were then used to dilate the interspace and this also helped to restore the fine low listhesis back into a neutral construct.  It was felt that an 8 mm tall 12 degree lordotic 23 mm long spacer would fit best into the interspace and this was filled with autograft allograft and infuse.  A total of 12 cc of autograft allograft and infuse was then packed into the interspace with the first cage being placed and then finally the second cage.  The lateral gutters were decorticated and these were packed with an additional 6 cc of autograft allograft and infuse.  Pedicle entry sites were then chosen at L4 and L5 and 6.5 x 45 mm screws were placed under fluoroscopic guidance into these regions 35 mm precontoured rods were then placed between the pedicle screws and tightened into a neutral construct.  Final radiographs were obtained in [facility] and lateral projection.  The retractors were removed soft tissue bleeding was controlled with bipolar cautery and monopolar cautery and when this was adequately established the lumbodorsal fascia was closed with a #1 Vicryl interrupted fashion 2-0 Vicryl was used in the subcutaneous tissues and 3-0 Vicryl subcuticularly.  25 cc of half percent Marcaine was injected into the paraspinous fascia at the close of the case and blood loss for the entire procedure was  estimated at 550 cc.  Patient tolerated to receive the procedure well was returned to recovery room in stable condition.

## 2019-01-06 NOTE — H&P (Signed)
Miguel Morales is an 73 y.o. male.   Chief Complaint: Back and bilateral lower extremity pain, spondylolisthesis L4-5 HPI: Miguel Morales. Miguel Morales is a 73 year old individual whose had significant problems with chronic back and bilateral lower extremity pain.  For a number of years of treated him conservatively with a number of epidural injections in addition to some suggestions for exercising stretching however he has had continued worsening back pain and alternating leg pain in either his left or the right lower extremities.  Currently it seems that his left side is involved more and a recent MRI few months ago demonstrates the advancement of significant spondylolisthesis at the level of L4-L5.  Patient also has some moderate disease at L2-3 and L3-4 however the symptomatic area appears to be related to L4-L5 and he is now to undergo surgical decompression and stabilization at that area.   Past Medical History:  Diagnosis Date  . Arthritis   . Asthma   . Cancer ([place])    skin,lung  . COPD (chronic obstructive pulmonary disease) ([place])   . GERD (gastroesophageal reflux disease)   . History of bronchitis   . Hypertension   . Mixed hyperlipidemia   . Nocturia   . Shortness of breath dyspnea    with exertion  . Sleep apnea    will sometimes wear a CPAP  . Spinal stenosis     Past Surgical History:  Procedure Laterality Date  . CIRCUMCISION    . COLONOSCOPY  03/04/2004   [id number] diverticula.  The remainder of the colonic mucosa appeared normal/normal rectum  . COLONOSCOPY  05/22/2009   [id number] mucosa appeared normal/Left-sided diverticula/Normal rectum  . COLONOSCOPY N/A 08/02/2014   Procedure: COLONOSCOPY;  Surgeon: Daneil Dolin, MD;  [facility];  Service: Endoscopy;  Laterality: N/A;  2:00 PM  . KNEE ARTHROSCOPY Right 2004  . LYMPH NODE DISSECTION N/A 04/08/2016   Procedure: LYMPH NODE DISSECTION;  Surgeon: Melrose Nakayama, MD;  Location: [facility];  Service:  Thoracic;  Laterality: N/A;  . SEPTOPLASTY    . VIDEO ASSISTED THORACOSCOPY (VATS)/WEDGE RESECTION Left 04/08/2016   Procedure: VIDEO ASSISTED THORACOSCOPY (VATS)/WEDGE RESECTION;  Surgeon: Melrose Nakayama, MD;  Location: [facility];  Service: Thoracic;  Laterality: Left;  Marland Kitchen VIDEO BRONCHOSCOPY WITH ENDOBRONCHIAL NAVIGATION N/A 02/26/2016   Procedure: VIDEO BRONCHOSCOPY WITH ENDOBRONCHIAL NAVIGATION LEFT UPPER LOBE LUNG NODULE;  Surgeon: [name], MD;  Location: [facility];  Service: Thoracic;  Laterality: N/A;    Family History  Problem Relation Age of Onset  . Cancer Mother    Social History:  reports that he quit smoking about 15 years ago. His smoking use included cigarettes. He started smoking about 60 years ago. He has a 30.00 pack-year smoking history. He has never used smokeless tobacco. He reports current alcohol use. He reports that he does not use drugs.  Allergies:  Allergies  Allergen Reactions  . Adhesive [Tape] Other (See Comments)    blisters  . Sulfa Antibiotics Hives    Medications Prior to Admission  Medication Sig Dispense Refill  . acetaminophen (TYLENOL) 650 MG CR tablet Take 1,300 mg by mouth every 8 (eight) hours as needed for pain.    Marland Kitchen albuterol (PROVENTIL HFA;VENTOLIN HFA) 108 (90 Base) MCG/ACT inhaler Inhale 2 puffs into the lungs every 6 (six) hours as needed for wheezing. 3 Inhaler 2  . amLODipine (NORVASC) 10 MG tablet TAKE 1 TABLET DAILY (Patient taking differently: Take 10 mg by mouth daily. ) 90 tablet  1  . Ascorbic Acid (VITAMIN C) 1000 MG tablet Take 1,000 mg by mouth daily.    Marland Kitchen aspirin 81 MG tablet Take 81 mg by mouth daily.    Marland Kitchen atorvastatin (LIPITOR) 80 MG tablet Take 1 tablet (80 mg total) by mouth daily at 6 PM. (Patient taking differently: Take 80 mg by mouth at bedtime. ) 90 tablet 2  . azelastine (ASTELIN) 0.1 % nasal spray Place 1 spray into both nostrils 2 (two) times daily. Use in each nostril as directed (Patient taking differently: Place 1  spray into both nostrils 2 (two) times daily as needed for rhinitis or allergies. Use in each nostril as directed) 30 mL 12  . budesonide-formoterol (SYMBICORT) 160-4.5 MCG/ACT inhaler Inhale 2 puffs into the lungs 2 (two) times daily.    . Carboxymethylcellulose Sod PF (THERATEARS) 1 % GEL Place 1 drop into both eyes as needed (dry eyes).    . Coenzyme Q10 (COQ10) 400 MG CAPS Take 2 capsules by mouth daily.     . Flaxseed, Linseed, (FLAXSEED OIL) 1000 MG CAPS Take 1 capsule by mouth daily.    . fluticasone (FLONASE) 50 MCG/ACT nasal spray USE 1 SPRAY IN EACH NOSTRIL DAILY AS NEEDED (Patient taking differently: Place 1 spray into both nostrils daily as needed for allergies. ) 16 g 4  . gabapentin (NEURONTIN) 100 MG capsule Take 200-300 mg by mouth at bedtime.     Marland Kitchen HYDROcodone-acetaminophen (NORCO/VICODIN) 5-325 MG tablet Take 1 tablet by mouth every 6 (six) hours as needed for moderate pain.    . hydrOXYzine (ATARAX/VISTARIL) 10 MG tablet Take 10 mg by mouth at bedtime.     Marland Kitchen ibuprofen (ADVIL,MOTRIN) 200 MG tablet Take 800 mg by mouth 2 (two) times daily as needed.     . lidocaine (XYLOCAINE) 5 % ointment Apply 1 application topically as needed for mild pain (For Back and hip).     . loratadine (CLARITIN) 10 MG tablet Take 10 mg by mouth daily as needed for allergies.    . Magnesium 250 MG TABS Take 2 tablets by mouth daily.     . meloxicam (MOBIC) 15 MG tablet Take 15 mg by mouth at bedtime.    Marland Kitchen omeprazole (PRILOSEC) 20 MG capsule TAKE 1 CAPSULE DAILY AS NEEDED (Patient taking differently: Take 20 mg by mouth daily as needed (heartburn). TAKE 1 CAPSULE DAILY AS NEEDED) 90 capsule 1  . SPIRIVA HANDIHALER 18 MCG inhalation capsule INHALE THE CONTENTS OF 1 CAPSULE DAILY (Patient taking differently: Place 18 mcg into inhaler and inhale daily. ) 90 capsule 1  . Turmeric Curcumin 500 MG CAPS Take 1,000 mg by mouth daily.       No results found for this or any previous visit (from the past 48  hour(s)). No results found.  Review of Systems  Constitutional: Negative.   HENT: Negative.   Eyes: Negative.   Respiratory: Positive for shortness of breath.        History of COPD  Cardiovascular: Negative.   Gastrointestinal: Negative.   Genitourinary: Negative.   Musculoskeletal: Positive for back pain.  Skin: Negative.   Neurological: Positive for sensory change and focal weakness.  Psychiatric/Behavioral: Negative.     Blood pressure 121/76, pulse 86, temperature 98.2 F (36.8 C), temperature source Oral, resp. rate 20, height 5\' 6"  (1.676 m), weight 95.3 kg, SpO2 97 %. Physical Exam  Constitutional: He is oriented to person, place, and time. He appears well-developed and well-nourished.  HENT:  Head: Normocephalic and  atraumatic.  Eyes: Pupils are equal, round, and reactive to light. Conjunctivae and EOM are normal.  Neck: Normal range of motion. Neck supple.  Cardiovascular: Normal rate and regular rhythm.  Respiratory: Effort normal and breath sounds normal.  GI: Soft. Bowel sounds are normal.  Neurological: He is alert and oriented to person, place, and time.  Mild weakness in the tibialis anterior graded at 4 out of 5 bilaterally positive straight leg raising at 30 degrees Patrick's maneuver is negative bilaterally.  Sensation is diminished over the dorsum of the feet on both sides.  Absent reflexes in the patella and the Achilles upper extremity reflexes are within limits of normal.  Skin: Skin is warm and dry.  Psychiatric: He has a normal mood and affect. His behavior is normal. Judgment and thought content normal.     Assessment/Plan Spondylolisthesis L4-L5 with stenosis, neurogenic claudication, lumbar radiculopathy.  Plan: Decompression L4-L5 with posterior lumbar interbody arthrodesis and pedicle screw fixation L4-L5.  Earleen Newport, MD 01/06/2019, 7:40 AM

## 2019-01-06 NOTE — Anesthesia Postprocedure Evaluation (Signed)
Anesthesia Post Note  Patient: Miguel Morales  Procedure(s) Performed: Lumbar Four-Five Posterior lumbar interbody fusion (N/A Spine Lumbar)     Patient location during evaluation: PACU Anesthesia Type: General Level of consciousness: awake and alert and awake Pain management: pain level controlled Vital Signs Assessment: post-procedure vital signs reviewed and stable Respiratory status: spontaneous breathing, nonlabored ventilation and respiratory function stable Cardiovascular status: blood pressure returned to baseline and stable Postop Assessment: no apparent nausea or vomiting Anesthetic complications: no    Last Vitals:  Vitals:   01/06/19 1430 01/06/19 1510  BP: 124/74 124/74  Pulse: 90 88  Resp: 16   Temp: 36.6 C 36.7 C  SpO2: 97% 97%    Last Pain:  Vitals:   01/06/19 1430  TempSrc:   PainSc: 0-No pain                 Catalina Gravel

## 2019-01-06 NOTE — Social Work (Signed)
CSW acknowledging consult for SNF placement. Will follow for therapy recommendations.   Younique Casad, MSW, LCSWA [facility] [phone]   

## 2019-01-07 LAB — CBC
HCT: 32.4 % — ABNORMAL LOW (ref 39.0–52.0)
Hemoglobin: 10.6 g/dL — ABNORMAL LOW (ref 13.0–17.0)
MCH: 31.7 pg (ref 26.0–34.0)
MCHC: 32.7 g/dL (ref 30.0–36.0)
MCV: 97 fL (ref 80.0–100.0)
Platelets: 244 10*3/uL (ref 150–400)
RBC: 3.34 MIL/uL — ABNORMAL LOW (ref 4.22–5.81)
RDW: 13.1 % (ref 11.5–15.5)
WBC: 12.6 10*3/uL — ABNORMAL HIGH (ref 4.0–10.5)
nRBC: 0 % (ref 0.0–0.2)

## 2019-01-07 LAB — BASIC METABOLIC PANEL
Anion gap: 9 (ref 5–15)
BUN: 12 mg/dL (ref 8–23)
CO2: 23 mmol/L (ref 22–32)
Calcium: 8.6 mg/dL — ABNORMAL LOW (ref 8.9–10.3)
Chloride: 107 mmol/L (ref 98–111)
Creatinine, Ser: 0.98 mg/dL (ref 0.61–1.24)
GFR calc Af Amer: >60 mL/min (ref >60)
GFR calc non Af Amer: >60 mL/min (ref >60)
Glucose, Bld: 141 mg/dL — ABNORMAL HIGH (ref 70–99)
Potassium: 4.3 mmol/L (ref 3.5–5.1)
SODIUM: 139 mmol/L (ref 135–145)

## 2019-01-07 NOTE — Evaluation (Signed)
Occupational Therapy Evaluation Patient Details Name: Miguel Morales MRN: [id number] DOB: [birth year] Today's Date: 01/07/2019    History of Present Illness 73 yo admitted for L4-5 PLIF. PMhx: ashtma, GERD, COPD, lung CA, HTN, stenosis   Clinical Impression   Patient is s/p L4-5 PLIF surgery resulting in the deficits listed below (see OT Problem List). The patient was independent prior to sx but reported slowing down. The patient was able to report 2/3 precautions. However, at the end os session was able to report. The patient was abel to don and doff brace. The patient was educated about need for shower chair versus built in shower seat due to space of built in. The patient also educated for need of long handle shoe horn, reacher, sock aid and long handle sponge. The patient requires 3 in 1 as has no grab bars at home to complete transfer. The patient also mentioned on how he does not have a RW at home and feels unsteady at times when ambulating without use. The patient at this time no longer needs acute occupational therapy needs.        Follow Up Recommendations  No OT follow up;Supervision/Assistance - 24 hour    Equipment Recommendations  3 in 1 bedside commode(rolling walker)    Recommendations for Other Services       Precautions / Restrictions Precautions Precautions: Back Precaution Booklet Issued: No Precaution Comments: able to report 2/3, PT issue booklet Required Braces or Orthoses: Spinal Brace Spinal Brace: Lumbar corset;Applied in sitting position Restrictions Weight Bearing Restrictions: No      Mobility Bed Mobility Overal bed mobility: (presented in chair) Bed Mobility: Rolling;Sidelying to Sit Rolling: Min guard Sidelying to sit: Min guard       General bed mobility comments: cues for sequence and precautions with bed flat and rail   Transfers Overall transfer level: Needs assistance Equipment used: Rolling walker (2 wheeled) Transfers: Sit to/from  Stand Sit to Stand: Supervision         General transfer comment: cues for hands     Balance Overall balance assessment: No apparent balance deficits (not formally assessed)(however reports occasionally unsteady )                                         ADL either performed or assessed with clinical judgement   ADL Overall ADL's : Needs assistance/impaired Eating/Feeding: Independent;Sitting   Grooming: Wash/dry hands;Oral care;Supervision/safety;Cueing for sequencing;Standing   Upper Body Bathing: Sitting;Supervision/ safety;Cueing for compensatory techniques   Lower Body Bathing: Set up;Cueing for sequencing;Cueing for compensatory techniques;Cueing for back precautions;Sit to/from stand   Upper Body Dressing : Modified independent;Standing;Sitting   Lower Body Dressing: Set up;Cueing for sequencing;Cueing for compensatory techniques;Cueing for back precautions   Toilet Transfer: Modified Independent;Grab bars;Comfort height toilet   Toileting- Clothing Manipulation and Hygiene: Modified independent;Sit to/from stand   Tub/ Shower Transfer: Walk-in shower;Min guard;Adhering to back precautions   Functional mobility during ADLs: Supervision/safety;Cueing for safety;Rolling walker       Vision Baseline Vision/History: Wears glasses Wears Glasses: At all times Patient Visual Report: No change from baseline Vision Assessment?: No apparent visual deficits     Perception Perception Perception Tested?: No   Praxis Praxis Praxis tested?: Not tested    Pertinent Vitals/Pain Pain Assessment: 0-10 Pain Score: 0-No pain Pain Intervention(s): Limited activity within patient's tolerance;Repositioned     Hand Dominance Right  Extremity/Trunk Assessment Upper Extremity Assessment Upper Extremity Assessment: Overall WFL for tasks assessed   Lower Extremity Assessment Lower Extremity Assessment: Defer to PT evaluation   Cervical / Trunk  Assessment Cervical / Trunk Assessment: Other exceptions Cervical / Trunk Exceptions: post surgical   Communication Communication Communication: No difficulties   Cognition Arousal/Alertness: Awake/alert Behavior During Therapy: WFL for tasks assessed/performed Overall Cognitive Status: Within Functional Limits for tasks assessed                                     General Comments       Exercises     Shoulder Instructions      Home Living Family/patient expects to be discharged to:: Private residence Living Arrangements: Spouse/significant other Available Help at Discharge: Family;Available 24 hours/day Type of Home: House Home Access: Stairs to enter [employer] of Steps: 1 Entrance Stairs-Rails: None Home Layout: Two level;Laundry or work area in basement;Able to live on main level with bedroom/bathroom     Bathroom Shower/Tub: Occupational psychologist: Handicapped height Bathroom Accessibility: Yes How Accessible: Accessible via walker Home Equipment: Kasandra Knudsen - single point;Shower seat - built in          Prior Functioning/Environment Level of Independence: Independent                 OT Problem List: Decreased strength;Decreased safety awareness;Decreased knowledge of use of DME or AE;Decreased knowledge of precautions;Pain      OT Treatment/Interventions:      OT Goals(Current goals can be found in the care plan section) Acute Rehab OT Goals Patient Stated Goal: go home OT Goal Formulation: With patient/family Time For Goal Achievement: 01/14/19 Potential to Achieve Goals: Good  OT Frequency:     Barriers to D/C:            Co-evaluation              AM-PAC OT "6 Clicks" Daily Activity     Outcome Measure Help from another person eating meals?: None Help from another person taking care of personal grooming?: None Help from another person toileting, which includes using toliet, bedpan, or urinal?:  None Help from another person bathing (including washing, rinsing, drying)?: A Little Help from another person to put on and taking off regular upper body clothing?: None Help from another person to put on and taking off regular lower body clothing?: A Little 6 Click Score: 22   End of Session Equipment Utilized During Treatment: Gait belt;Rolling walker;Back brace  Activity Tolerance: Patient tolerated treatment well;No increased pain Patient left: in chair;with call bell/phone within reach;with family/visitor present  OT Visit Diagnosis: Unsteadiness on feet (R26.81);Muscle weakness (generalized) (M62.81)                Time: 7282-0601 OT Time Calculation (min): 58 min Charges:  OT General Charges $OT Visit: 1 Visit OT Evaluation $OT Eval Low Complexity: 1 Low OT Treatments $Self Care/Home Management : 38-52 mins  Joeseph Amor OTR/L  Acute Rehab Services  [phone] office number [phone] pager number   Joeseph Amor 01/07/2019, 11:44 AM

## 2019-01-07 NOTE — Evaluation (Signed)
Physical Therapy Evaluation Patient Details Name: Miguel Morales MRN: [id number] DOB: 06-19-1946 Today's Date: 01/07/2019   History of Present Illness  73 yo admitted for L4-5 PLIF. PMhx: ashtma, GERD, COPD, lung CA, HTN, stenosis  Clinical Impression  Pt very pleasant and eager to get OOB. Pt educated for all precautions, brace wear, functional mobility and progression. PT assisted with dressing and donning brace EOB with cues for brace and assist to dress lower body. Pt moving well and does not require RW for gait. Pt with decreased transfers and gait who will benefit from acute therapy to maximize independence and adherence to precautions.      Follow Up Recommendations No PT follow up    Equipment Recommendations  None recommended by PT    Recommendations for Other Services       Precautions / Restrictions Precautions Precautions: Back Precaution Booklet Issued: Yes (comment) Required Braces or Orthoses: Spinal Brace Spinal Brace: Lumbar corset;Applied in sitting position Restrictions Weight Bearing Restrictions: No      Mobility  Bed Mobility Overal bed mobility: Needs Assistance Bed Mobility: Rolling;Sidelying to Sit Rolling: Min guard Sidelying to sit: Min guard       General bed mobility comments: cues for sequence and precautions with bed flat and rail   Transfers Overall transfer level: Needs assistance   Transfers: Sit to/from Stand Sit to Stand: Min guard         General transfer comment: cues for hand placement and posture  Ambulation/Gait Ambulation/Gait assistance: Supervision Gait Distance (Feet): 300 Feet Assistive device: Rolling walker (2 wheeled);None Gait Pattern/deviations: WFL(Within Functional Limits)   Gait velocity interpretation: >2.62 ft/sec, indicative of community ambulatory General Gait Details: pt walked 250' with RW without reliance on it then then last 32' without AD with good stability and speed  Stairs Stairs:  Yes Stairs assistance: Modified independent (Device/Increase time)   Number of Stairs: 1    Wheelchair Mobility    Modified Rankin (Stroke Patients Only)       Balance Overall balance assessment: No apparent balance deficits (not formally assessed)                                           Pertinent Vitals/Pain Pain Assessment: No/denies pain    Home Living Family/patient expects to be discharged to:: Private residence Living Arrangements: Spouse/significant other Available Help at Discharge: Family;Available 24 hours/day Type of Home: House Home Access: Stairs to enter   [employer] of Steps: 1 Home Layout: Two level;Laundry or work area in basement;Able to live on main level with bedroom/bathroom Home Equipment: Kasandra Knudsen - single point;Shower seat - built in      Prior Function Level of Independence: Independent               Journalist, newspaper        Extremity/Trunk Assessment   Upper Extremity Assessment Upper Extremity Assessment: Overall WFL for tasks assessed    Lower Extremity Assessment Lower Extremity Assessment: Overall WFL for tasks assessed    Cervical / Trunk Assessment Cervical / Trunk Assessment: Other exceptions Cervical / Trunk Exceptions: post surgical  Communication   Communication: No difficulties  Cognition Arousal/Alertness: Awake/alert Behavior During Therapy: WFL for tasks assessed/performed Overall Cognitive Status: Within Functional Limits for tasks assessed  General Comments      Exercises     Assessment/Plan    PT Assessment Patient needs continued PT services  PT Problem List Decreased activity tolerance;Decreased mobility;Decreased knowledge of use of DME;Decreased knowledge of precautions       PT Treatment Interventions Gait training;Therapeutic activities;Therapeutic exercise;Functional mobility training;Patient/family education     PT Goals (Current goals can be found in the Care Plan section)  Acute Rehab PT Goals Patient Stated Goal: return home and piddle PT Goal Formulation: With patient Time For Goal Achievement: 01/14/19 Potential to Achieve Goals: Good    Frequency Min 5X/week   Barriers to discharge        Co-evaluation               AM-PAC PT "6 Clicks" Mobility  Outcome Measure Help needed turning from your back to your side while in a flat bed without using bedrails?: A Little Help needed moving from lying on your back to sitting on the side of a flat bed without using bedrails?: A Little Help needed moving to and from a bed to a chair (including a wheelchair)?: A Little Help needed standing up from a chair using your arms (e.g., wheelchair or bedside chair)?: A Little Help needed to walk in hospital room?: None Help needed climbing 3-5 steps with a railing? : None 6 Click Score: 20    End of Session Equipment Utilized During Treatment: Back brace Activity Tolerance: Patient tolerated treatment well Patient left: in chair;with call bell/phone within reach Nurse Communication: Mobility status PT Visit Diagnosis: Other abnormalities of gait and mobility (R26.89)    Time: 6599-3570 PT Time Calculation (min) (ACUTE ONLY): 22 min   Charges:   PT Evaluation $PT Eval Moderate Complexity: 1 Mod          [place] Pager: [phone] Office: [place] 01/07/2019, 8:45 AM

## 2019-01-07 NOTE — Progress Notes (Signed)
NEUROSURGERY PROGRESS NOTE  Doing well. Complains of appropriate back soreness. No leg pain No numbness, tingling or weakness Ambulating and voiding well Good strength and sensation Incision CDI  Temp:  [97.8 F (36.6 C)-99.5 F (37.5 C)] 97.8 F (36.6 C) (02/29 0844) Pulse Rate:  [80-98] 98 (02/29 0856) Resp:  [13-19] 18 (02/29 0856) BP: (112-126)/(62-80) 124/80 (02/29 0844) SpO2:  [94 %-98 %] 96 % (02/29 0856)  Plan: Continue therapies. Dc home on Monday per Dr. Azalia Bilis, NP 01/07/2019 9:12 AM

## 2019-01-08 NOTE — Care Management Note (Signed)
Case Management Note RN CM weekend coverage for 4NP [phone]  Patient Details  Name: Miguel Morales MRN: [id number] Date of Birth: November 25, 1945  Subjective/Objective:   Pt admitted s/p  L4-5 decompression for spondylolisthesis with severe stenosis.                   Action/Plan: PTA pt lived at home, received call from pt's bedside RN that DME was needed for transition home- RW and 3n1- orders placed and call made to [facility] with [facility] for DME needs- RW and 3n1 to be delivered to room prior to discharge.   Expected Discharge Date:  01/08/19               Expected Discharge Plan:  Home/Self Care  In-House Referral:     Discharge planning Services  CM Consult  Post Acute Care Choice:  Durable Medical Equipment Choice offered to:     DME Arranged:  3-N-1, Walker rolling DME Agency:  [place]:  NA [place] Agency:  NA  Status of Service:  Completed, signed off  If discussed at [place] of Stay Meetings, dates discussed:    Discharge Disposition: home/self care   Additional Comments:  Dawayne Patricia, RN 01/08/2019, 11:59 AM

## 2019-01-08 NOTE — Progress Notes (Signed)
IV removed, discharge instructions reviewed with patient and wife.  Patient transported via wheelchair home with wife.

## 2019-01-08 NOTE — Discharge Summary (Signed)
Physician Discharge Summary  Patient ID: Miguel Morales MRN: [id number] DOB/AGE: 13-Dec-1945 73 y.o. Estimated body mass index is 33.89 kg/m as calculated from the following:   Height as of this encounter: 5\' 6"  (1.676 m).   Weight as of this encounter: 95.3 kg.   Admit date: 01/06/2019 Discharge date: 01/08/2019  Admission Diagnoses:lumbar spinal stenosis and grade 1 spondylolisthesis  Discharge Diagnoses: same Active Problems:   Spondylolisthesis at L4-L5 level   Discharged Condition: good  Hospital Course: patient admitted hospital underwent decompression stabilization procedure. Postoperatively patient did very well recovered in the floor on the floor was angling and voiding spontaneously tolerating regular diet stable for discharge home. Patient discharged her scheduled follow-up in one to 2 weeks.  Consults: Significant Diagnostic Studies: Treatments:decompression stabilization procedure lumbar spine Discharge Exam: Blood pressure 123/71, pulse 94, temperature 99.6 F (37.6 C), temperature source Oral, resp. rate 18, height 5\' 6"  (1.676 m), weight 95.3 kg, SpO2 96 %. Strength out of 5 wound clean dry and intact  Disposition: home  Discharge Instructions    DME Bedside commode   Complete by:  As directed    Patient needs a bedside commode to treat with the following condition:  Spinal stenosis of lumbar region   Incentive spirometry RT   Complete by:  As directed      Allergies as of 01/08/2019      Reactions   Adhesive [tape] Other (See Comments)   blisters   Sulfa Antibiotics Hives      Medication List    TAKE these medications   acetaminophen 650 MG CR tablet Commonly known as:  TYLENOL Take 1,300 mg by mouth every 8 (eight) hours as needed for pain.   albuterol 108 (90 Base) MCG/ACT inhaler Commonly known as:  PROVENTIL HFA;VENTOLIN HFA Inhale 2 puffs into the lungs every 6 (six) hours as needed for wheezing.   amLODipine 10 MG tablet Commonly known  as:  NORVASC TAKE 1 TABLET DAILY   aspirin 81 MG tablet Take 81 mg by mouth daily.   atorvastatin 80 MG tablet Commonly known as:  LIPITOR Take 1 tablet (80 mg total) by mouth daily at 6 PM. What changed:  when to take this   azelastine 0.1 % nasal spray Commonly known as:  ASTELIN Place 1 spray into both nostrils 2 (two) times daily. Use in each nostril as directed What changed:    when to take this  reasons to take this   budesonide-formoterol 160-4.5 MCG/ACT inhaler Commonly known as:  SYMBICORT Inhale 2 puffs into the lungs 2 (two) times daily.   CoQ10 400 MG Caps Take 2 capsules by mouth daily.   Flaxseed Oil 1000 MG Caps Take 1 capsule by mouth daily.   fluticasone 50 MCG/ACT nasal spray Commonly known as:  FLONASE USE 1 SPRAY IN EACH NOSTRIL DAILY AS NEEDED What changed:  See the new instructions.   gabapentin 100 MG capsule Commonly known as:  NEURONTIN Take 200-300 mg by mouth at bedtime.   HYDROcodone-acetaminophen 5-325 MG tablet Commonly known as:  NORCO/VICODIN Take 1 tablet by mouth every 6 (six) hours as needed for moderate pain.   hydrOXYzine 10 MG tablet Commonly known as:  ATARAX/VISTARIL Take 10 mg by mouth at bedtime.   ibuprofen 200 MG tablet Commonly known as:  ADVIL,MOTRIN Take 800 mg by mouth 2 (two) times daily as needed.   lidocaine 5 % ointment Commonly known as:  XYLOCAINE Apply 1 application topically as needed for mild pain (For Back  and hip).   loratadine 10 MG tablet Commonly known as:  CLARITIN Take 10 mg by mouth daily as needed for allergies.   Magnesium 250 MG Tabs Take 2 tablets by mouth daily.   meloxicam 15 MG tablet Commonly known as:  MOBIC Take 15 mg by mouth at bedtime.   omeprazole 20 MG capsule Commonly known as:  PRILOSEC TAKE 1 CAPSULE DAILY AS NEEDED What changed:  See the new instructions.   SPIRIVA HANDIHALER 18 MCG inhalation capsule Generic drug:  tiotropium INHALE THE CONTENTS OF 1 CAPSULE  DAILY What changed:  how much to take   THERATEARS 1 % Gel Generic drug:  Carboxymethylcellulose Sod PF Place 1 drop into both eyes as needed (dry eyes).   Turmeric Curcumin 500 MG Caps Take 1,000 mg by mouth daily.   vitamin C 1000 MG tablet Take 1,000 mg by mouth daily.            Durable Medical Equipment  (From admission, onward)         Start     Ordered   01/08/19 0000  DME Bedside commode    Question:  Patient needs a bedside commode to treat with the following condition  Answer:  Spinal stenosis of lumbar region   01/08/19 0756           Signed: Clever Geraldo P 01/08/2019, 7:57 AM

## 2019-01-08 NOTE — Progress Notes (Signed)
Physical Therapy Treatment Patient Details Name: Miguel Morales MRN: [id number] DOB: Jan 30, 1946 Today's Date: 01/08/2019    History of Present Illness 73 yo admitted for L4-5 PLIF. PMhx: ashtma, GERD, COPD, lung CA, HTN, stenosis    PT Comments    The pt continues to make progress with mobility with less cues/assistance needed today. Acute PT to continue during pt's hospital stay.    Follow Up Recommendations  No PT follow up     Equipment Recommendations  None recommended by PT    Precautions / Restrictions Precautions Precautions: Back Precaution Comments: pt able to recall 3/3 precautions and demo's with session today.  Required Braces or Orthoses: Spinal Brace Spinal Brace: Lumbar corset;Applied in sitting position(pt seated in chair with brace donned correctly )    Mobility  Bed Mobility               General bed mobility comments: not tested- pt in chair before and after session today   Transfers Overall transfer level: Modified independent   Transfers: Sit to/from Stand Sit to Stand: Modified independent (Device/Increase time)         General transfer comment: demo's safe technique with slight in incr in time to stand  Ambulation/Gait Ambulation/Gait assistance: Modified independent (Device/Increase time) Gait Distance (Feet): 300 Feet Assistive device: Rolling walker (2 wheeled) Gait Pattern/deviations: Step-through pattern;WFL(Within Functional Limits)   Gait velocity interpretation: 1.31 - 2.62 ft/sec, indicative of limited community ambulator General Gait Details: demo's safe use of walker with no balance issues.    Stairs         General stair comments: pt deferred further practice today, feels good with technique after practice at last session.         Cognition Arousal/Alertness: Awake/alert Behavior During Therapy: WFL for tasks assessed/performed Overall Cognitive Status: Within Functional Limits for tasks assessed              Pertinent Vitals/Pain Pain Assessment: 0-10 Pain Score: 0-No pain     PT Goals (current goals can now be found in the care plan section) Acute Rehab PT Goals Patient Stated Goal: go home PT Goal Formulation: With patient Time For Goal Achievement: 01/14/19 Potential to Achieve Goals: Good Progress towards PT goals: Progressing toward goals    Frequency    Min 5X/week      PT Plan Current plan remains appropriate    AM-PAC PT "6 Clicks" Mobility   Outcome Measure  Help needed turning from your back to your side while in a flat bed without using bedrails?: A Little Help needed moving from lying on your back to sitting on the side of a flat bed without using bedrails?: A Little Help needed moving to and from a bed to a chair (including a wheelchair)?: None Help needed standing up from a chair using your arms (e.g., wheelchair or bedside chair)?: None Help needed to walk in hospital room?: None Help needed climbing 3-5 steps with a railing? : None 6 Click Score: 22    End of Session Equipment Utilized During Treatment: Back brace Activity Tolerance: Patient tolerated treatment well;No increased pain Patient left: in chair;with call bell/phone within reach Nurse Communication: Mobility status PT Visit Diagnosis: Other abnormalities of gait and mobility (R26.89)     Time: 5638-7564 PT Time Calculation (min) (ACUTE ONLY): 10 min  Charges:  $Gait Training: 8-22 mins           Willow Ora, PTA, CLT Acute Rehab Services [phone] 01/08/19, 8:58 AM  Willow Ora 01/08/2019, 8:57 AM

## 2019-01-08 NOTE — Progress Notes (Signed)
Discovered patient to have temp of 100.2  After discharge orders given.  Called Margo Aye, PA to advise.  Okay per Maudie Mercury to proceed with discharge.

## 2019-01-10 NOTE — Consult Note (Signed)
            [facility] Primary Care Navigator  01/10/2019  DEAGEN KRASS December 10, 1945 [id number]   Attempt to see patientat the bedside to identify possible discharge needs but he was already discharged home over the weekend.  Per MD note,patientwas admitted for spondylolisthesis at L4-L5 underwent decompression stabilization procedure.  Primary care provider's office is listed as providing transition of care (TOC).   For additional questions please contact:  Edwena Felty A. Zoejane Gaulin, BSN, RN-BC [facility] PRIMARY CARE Navigator Cell: [phone]

## 2019-01-20 ENCOUNTER — Telehealth: Payer: Self-pay | Admitting: Internal Medicine

## 2019-01-20 DIAGNOSIS — M4316 Spondylolisthesis, lumbar region: Secondary | ICD-10-CM | POA: Diagnosis not present

## 2019-01-20 DIAGNOSIS — M48062 Spinal stenosis, lumbar region with neurogenic claudication: Secondary | ICD-10-CM | POA: Diagnosis not present

## 2019-01-20 NOTE — Telephone Encounter (Signed)
Per voicemail, pt needs to reschedule appt. Spoke to patient, will be out of town. Moved 7/21 to 7/27. Confirmed date and time with patient

## 2019-01-31 ENCOUNTER — Other Ambulatory Visit: Payer: Self-pay

## 2019-01-31 ENCOUNTER — Telehealth: Payer: Self-pay | Admitting: Family Medicine

## 2019-01-31 MED ORDER — TIOTROPIUM BROMIDE MONOHYDRATE 18 MCG IN CAPS: 18.0000 ug | ORAL_CAPSULE | Freq: Every day | RESPIRATORY_TRACT | 0 refills | Status: DC

## 2019-01-31 NOTE — Telephone Encounter (Signed)
Patient is requesting refill on spiriva handihaler 18 mcg due to [place] is out. He states just needs 30 day supply called into [address].

## 2019-01-31 NOTE — Telephone Encounter (Signed)
Please send in 30-day supply

## 2019-01-31 NOTE — Telephone Encounter (Signed)
Last seen 11/11/2018 htn. This last filled 2016.

## 2019-01-31 NOTE — Telephone Encounter (Signed)
Medication refill sent in to pharmacy and pt is aware

## 2019-02-28 DIAGNOSIS — M48062 Spinal stenosis, lumbar region with neurogenic claudication: Secondary | ICD-10-CM | POA: Diagnosis not present

## 2019-04-26 ENCOUNTER — Telehealth: Payer: Self-pay | Admitting: Family Medicine

## 2019-04-26 DIAGNOSIS — E785 Hyperlipidemia, unspecified: Secondary | ICD-10-CM

## 2019-04-26 DIAGNOSIS — D649 Anemia, unspecified: Secondary | ICD-10-CM

## 2019-04-26 NOTE — Telephone Encounter (Signed)
Pt called back. Informed pt labs have been ordered

## 2019-04-26 NOTE — Telephone Encounter (Signed)
Pt wants to know if lab work needs to be done before Monday's appt 6/22

## 2019-04-26 NOTE — Telephone Encounter (Signed)
Last labs 11/08/18 lipid, liver, psa, bmp, hep c antibody

## 2019-04-26 NOTE — Telephone Encounter (Signed)
Orders put in. Left message to return call to notify pt.  

## 2019-04-26 NOTE — Telephone Encounter (Signed)
Lipid, met 7, CBC-anemia, hyperlipidemia, hypocalcemia

## 2019-04-27 DIAGNOSIS — D649 Anemia, unspecified: Secondary | ICD-10-CM | POA: Diagnosis not present

## 2019-04-27 DIAGNOSIS — E785 Hyperlipidemia, unspecified: Secondary | ICD-10-CM | POA: Diagnosis not present

## 2019-04-28 LAB — CBC WITH DIFFERENTIAL/PLATELET
Basophils Absolute: 0.1 10*3/uL (ref 0.0–0.2)
Basos: 1 %
EOS (ABSOLUTE): 0.1 10*3/uL (ref 0.0–0.4)
Eos: 2 %
Hematocrit: 43.4 % (ref 37.5–51.0)
Hemoglobin: 14.5 g/dL (ref 13.0–17.7)
Immature Grans (Abs): 0 10*3/uL (ref 0.0–0.1)
Immature Granulocytes: 1 %
Lymphocytes Absolute: 1.5 10*3/uL (ref 0.7–3.1)
Lymphs: 24 %
MCH: 29.5 pg (ref 26.6–33.0)
MCHC: 33.4 g/dL (ref 31.5–35.7)
MCV: 88 fL (ref 79–97)
Monocytes Absolute: 0.6 10*3/uL (ref 0.1–0.9)
Monocytes: 11 %
Neutrophils Absolute: 3.7 10*3/uL (ref 1.4–7.0)
Neutrophils: 61 %
Platelets: 273 10*3/uL (ref 150–450)
RBC: 4.91 x10E6/uL (ref 4.14–5.80)
RDW: 13.2 % (ref 11.6–15.4)
WBC: 6 10*3/uL (ref 3.4–10.8)

## 2019-04-28 LAB — BASIC METABOLIC PANEL
BUN/Creatinine Ratio: 18 (ref 10–24)
BUN: 18 mg/dL (ref 8–27)
CO2: 23 mmol/L (ref 20–29)
Calcium: 10 mg/dL (ref 8.6–10.2)
Chloride: 103 mmol/L (ref 96–106)
Creatinine, Ser: 0.98 mg/dL (ref 0.76–1.27)
GFR calc Af Amer: 87 mL/min/{1.73_m2} (ref >59)
GFR calc non Af Amer: 75 mL/min/{1.73_m2} (ref >59)
Glucose: 123 mg/dL — ABNORMAL HIGH (ref 65–99)
Potassium: 3.9 mmol/L (ref 3.5–5.2)
Sodium: 141 mmol/L (ref 134–144)

## 2019-04-28 LAB — LIPID PANEL
Chol/HDL Ratio: 3.5 ratio (ref 0.0–5.0)
Cholesterol, Total: 123 mg/dL (ref 100–199)
HDL: 35 mg/dL — ABNORMAL LOW (ref >39)
LDL Calculated: 71 mg/dL (ref 0–99)
Triglycerides: 84 mg/dL (ref 0–149)
VLDL Cholesterol Cal: 17 mg/dL (ref 5–40)

## 2019-05-01 ENCOUNTER — Ambulatory Visit (INDEPENDENT_AMBULATORY_CARE_PROVIDER_SITE_OTHER): Payer: Medicare Other | Admitting: Family Medicine

## 2019-05-01 ENCOUNTER — Other Ambulatory Visit: Payer: Self-pay

## 2019-05-01 VITALS — Wt 199.0 lb

## 2019-05-01 DIAGNOSIS — R739 Hyperglycemia, unspecified: Secondary | ICD-10-CM | POA: Diagnosis not present

## 2019-05-01 DIAGNOSIS — I10 Essential (primary) hypertension: Secondary | ICD-10-CM

## 2019-05-01 DIAGNOSIS — R7301 Impaired fasting glucose: Secondary | ICD-10-CM | POA: Diagnosis not present

## 2019-05-01 NOTE — Progress Notes (Signed)
   Subjective:    Patient ID: Miguel Morales, male    DOB: 07-12-1946, 73 y.o.   MRN: [id number] Telephone visit video not possible Hypertension This is a chronic problem. The current episode started more than 1 year ago. Pertinent negatives include no chest pain, headaches or shortness of breath. There are no compliance problems (has been walking 1 -2 miles almost every day, eats healthy, takes meds every day).   Phone visit patient taking medication [facility] has him on medicine recent lab work overall looks good patient is trying to stay physically active and back surgery earlier this year denies any major setbacks Pt states no concerns today.   Virtual Visit via Telephone Note  I connected with Miguel Morales on 05/01/19 at 10:00 AM EDT by telephone and verified that I am speaking with the correct person using two identifiers.  Location: Patient: home Provider: office   I discussed the limitations, risks, security and privacy concerns of performing an evaluation and management service by telephone and the availability of in person appointments. I also discussed with the patient that there may be a patient responsible charge related to this service. The patient expressed understanding and agreed to proceed.   History of Present Illness:    Observations/Objective:   Assessment and Plan:   Follow Up Instructions:    I discussed the assessment and treatment plan with the patient. The patient was provided an opportunity to ask questions and all were answered. The patient agreed with the plan and demonstrated an understanding of the instructions.   The patient was advised to call back or seek an in-person evaluation if the symptoms worsen or if the condition fails to improve as anticipated.  I provided 15 minutes of non-face-to-face time during this encounter.     .   Review of Systems  Constitutional: Negative for diaphoresis and fatigue.  HENT: Negative for congestion and  rhinorrhea.   Respiratory: Negative for cough and shortness of breath.   Cardiovascular: Negative for chest pain and leg swelling.  Gastrointestinal: Negative for abdominal pain and diarrhea.  Skin: Negative for color change and rash.  Neurological: Negative for dizziness and headaches.  Psychiatric/Behavioral: Negative for behavioral problems and confusion.       Objective:   Physical Exam   Today's visit was via telephone Physical exam was not possible for this visit      Assessment & Plan:  I am concerned his recent blood work shows a fasting hyperglycemia of significant nature but this needs to be rechecked again later this summer along with healthy eating regular physical activity it is possible patient may need to be on medication  Back surgery he is gradually getting better from this  He will see his lung cancer specialist in the summer for follow-up CAT scan  Blood pressure overall doing well continue current medications  Follow-up late in the fall

## 2019-05-22 DIAGNOSIS — J3489 Other specified disorders of nose and nasal sinuses: Secondary | ICD-10-CM | POA: Diagnosis not present

## 2019-05-22 DIAGNOSIS — J029 Acute pharyngitis, unspecified: Secondary | ICD-10-CM | POA: Diagnosis not present

## 2019-05-22 DIAGNOSIS — R05 Cough: Secondary | ICD-10-CM | POA: Diagnosis not present

## 2019-05-22 DIAGNOSIS — Z87891 Personal history of nicotine dependence: Secondary | ICD-10-CM | POA: Diagnosis not present

## 2019-05-22 DIAGNOSIS — J988 Other specified respiratory disorders: Secondary | ICD-10-CM | POA: Diagnosis not present

## 2019-05-26 ENCOUNTER — Ambulatory Visit (HOSPITAL_COMMUNITY)
Admission: RE | Admit: 2019-05-26 | Discharge: 2019-05-26 | Disposition: A | Discharge status: Home / Self Care | Payer: Medicare Other | Source: Ambulatory Visit | Attending: Internal Medicine | Admitting: Internal Medicine

## 2019-05-26 ENCOUNTER — Other Ambulatory Visit: Payer: Self-pay

## 2019-05-26 ENCOUNTER — Inpatient Hospital Stay: Payer: Medicare Other | Attending: Internal Medicine

## 2019-05-26 DIAGNOSIS — Z7982 Long term (current) use of aspirin: Secondary | ICD-10-CM | POA: Insufficient documentation

## 2019-05-26 DIAGNOSIS — C3490 Malignant neoplasm of unspecified part of unspecified bronchus or lung: Secondary | ICD-10-CM | POA: Diagnosis not present

## 2019-05-26 DIAGNOSIS — E782 Mixed hyperlipidemia: Secondary | ICD-10-CM | POA: Diagnosis not present

## 2019-05-26 DIAGNOSIS — Z79899 Other long term (current) drug therapy: Secondary | ICD-10-CM | POA: Insufficient documentation

## 2019-05-26 DIAGNOSIS — I10 Essential (primary) hypertension: Secondary | ICD-10-CM | POA: Diagnosis not present

## 2019-05-26 DIAGNOSIS — J449 Chronic obstructive pulmonary disease, unspecified: Secondary | ICD-10-CM | POA: Diagnosis not present

## 2019-05-26 DIAGNOSIS — Z902 Acquired absence of lung [part of]: Secondary | ICD-10-CM | POA: Insufficient documentation

## 2019-05-26 DIAGNOSIS — G4730 Sleep apnea, unspecified: Secondary | ICD-10-CM | POA: Insufficient documentation

## 2019-05-26 DIAGNOSIS — R918 Other nonspecific abnormal finding of lung field: Secondary | ICD-10-CM | POA: Diagnosis not present

## 2019-05-26 DIAGNOSIS — Z7951 Long term (current) use of inhaled steroids: Secondary | ICD-10-CM | POA: Insufficient documentation

## 2019-05-26 DIAGNOSIS — Z85118 Personal history of other malignant neoplasm of bronchus and lung: Secondary | ICD-10-CM | POA: Diagnosis not present

## 2019-05-26 DIAGNOSIS — J439 Emphysema, unspecified: Secondary | ICD-10-CM | POA: Diagnosis not present

## 2019-05-26 DIAGNOSIS — C3412 Malignant neoplasm of upper lobe, left bronchus or lung: Secondary | ICD-10-CM | POA: Insufficient documentation

## 2019-05-26 LAB — CBC WITH DIFFERENTIAL (CANCER CENTER ONLY)
Abs Immature Granulocytes: 0.04 10*3/uL (ref 0.00–0.07)
Basophils Absolute: 0 10*3/uL (ref 0.0–0.1)
Basophils Relative: 1 %
Eosinophils Absolute: 0.1 10*3/uL (ref 0.0–0.5)
Eosinophils Relative: 2 %
HCT: 40.9 % (ref 39.0–52.0)
Hemoglobin: 13.3 g/dL (ref 13.0–17.0)
Immature Granulocytes: 1 %
Lymphocytes Relative: 21 %
Lymphs Abs: 1.2 10*3/uL (ref 0.7–4.0)
MCH: 29.5 pg (ref 26.0–34.0)
MCHC: 32.5 g/dL (ref 30.0–36.0)
MCV: 90.7 fL (ref 80.0–100.0)
Monocytes Absolute: 0.6 10*3/uL (ref 0.1–1.0)
Monocytes Relative: 11 %
Neutro Abs: 3.6 10*3/uL (ref 1.7–7.7)
Neutrophils Relative %: 64 %
Platelet Count: 235 10*3/uL (ref 150–400)
RBC: 4.51 MIL/uL (ref 4.22–5.81)
RDW: 15.5 % (ref 11.5–15.5)
WBC Count: 5.6 10*3/uL (ref 4.0–10.5)
nRBC: 0 % (ref 0.0–0.2)

## 2019-05-26 LAB — CMP (CANCER CENTER ONLY)
ALT: 17 U/L (ref 0–44)
AST: 22 U/L (ref 15–41)
Albumin: 3.8 g/dL (ref 3.5–5.0)
Alkaline Phosphatase: 100 U/L (ref 38–126)
Anion gap: 9 (ref 5–15)
BUN: 19 mg/dL (ref 8–23)
CO2: 27 mmol/L (ref 22–32)
Calcium: 8.7 mg/dL — ABNORMAL LOW (ref 8.9–10.3)
Chloride: 106 mmol/L (ref 98–111)
Creatinine: 1.14 mg/dL (ref 0.61–1.24)
GFR, Est AFR Am: >60 mL/min (ref >60)
GFR, Estimated: >60 mL/min (ref >60)
Glucose, Bld: 129 mg/dL — ABNORMAL HIGH (ref 70–99)
Potassium: 3.9 mmol/L (ref 3.5–5.1)
Sodium: 142 mmol/L (ref 135–145)
Total Bilirubin: 0.5 mg/dL (ref 0.3–1.2)
Total Protein: 6.9 g/dL (ref 6.5–8.1)

## 2019-05-26 MED ORDER — SODIUM CHLORIDE (PF) 0.9 % IJ SOLN
INTRAMUSCULAR | Status: AC
  Filled 2019-05-26: qty 50

## 2019-05-26 MED ORDER — IOHEXOL 300 MG/ML  SOLN
75.0000 mL | Freq: Once | INTRAMUSCULAR | Status: AC | PRN
  Administered 2019-05-26: 11:00:00 75 mL via INTRAVENOUS

## 2019-05-30 ENCOUNTER — Ambulatory Visit: Payer: Medicare Other | Admitting: Internal Medicine

## 2019-06-05 ENCOUNTER — Other Ambulatory Visit: Payer: Self-pay

## 2019-06-05 ENCOUNTER — Encounter: Payer: Self-pay | Admitting: Internal Medicine

## 2019-06-05 ENCOUNTER — Inpatient Hospital Stay (HOSPITAL_BASED_OUTPATIENT_CLINIC_OR_DEPARTMENT_OTHER): Payer: Medicare Other | Admitting: Internal Medicine

## 2019-06-05 ENCOUNTER — Telehealth: Payer: Self-pay | Admitting: Internal Medicine

## 2019-06-05 DIAGNOSIS — J449 Chronic obstructive pulmonary disease, unspecified: Secondary | ICD-10-CM | POA: Diagnosis not present

## 2019-06-05 DIAGNOSIS — Z7982 Long term (current) use of aspirin: Secondary | ICD-10-CM

## 2019-06-05 DIAGNOSIS — C3412 Malignant neoplasm of upper lobe, left bronchus or lung: Secondary | ICD-10-CM

## 2019-06-05 DIAGNOSIS — I10 Essential (primary) hypertension: Secondary | ICD-10-CM

## 2019-06-05 DIAGNOSIS — Z7951 Long term (current) use of inhaled steroids: Secondary | ICD-10-CM | POA: Diagnosis not present

## 2019-06-05 DIAGNOSIS — Z902 Acquired absence of lung [part of]: Secondary | ICD-10-CM

## 2019-06-05 DIAGNOSIS — G4730 Sleep apnea, unspecified: Secondary | ICD-10-CM | POA: Diagnosis not present

## 2019-06-05 DIAGNOSIS — Z79899 Other long term (current) drug therapy: Secondary | ICD-10-CM | POA: Diagnosis not present

## 2019-06-05 DIAGNOSIS — E782 Mixed hyperlipidemia: Secondary | ICD-10-CM | POA: Diagnosis not present

## 2019-06-05 DIAGNOSIS — C3490 Malignant neoplasm of unspecified part of unspecified bronchus or lung: Secondary | ICD-10-CM

## 2019-06-05 NOTE — Progress Notes (Signed)
[place] [phone]   Fax:([phone]) [phone]  OFFICE PROGRESS NOTE  [name], MD [place] [address]  DIAGNOSIS: Stage IA (T1a, N0, M0) non-small cell lung cancer, adenocarcinoma with negative EGFR, ALK, ROS 1 and BRAF mutations diagnosed in May 2017. PDL 1 expression is 10%  PRIOR THERAPY: Status post left upper lobe lingular segmentectomy with lymph node dissection under the care of Dr. Roxan Hockey on 04/08/2016.  CURRENT THERAPY: Observation.  INTERVAL HISTORY: Miguel Morales 73 y.o. male returns to the clinic today for annual follow-up visit.  The patient is feeling fine today with no concerning complaints.  He denied having any chest pain but has shortness of breath with exertion with no cough or hemoptysis.  He denied having any fever or chills.  He has no nausea, vomiting, diarrhea or constipation.  He has no headache or visual changes.  The patient had repeat CT scan of the chest performed recently and he is here for evaluation and discussion of his scan results.  MEDICAL HISTORY: Past Medical History:  Diagnosis Date  . Arthritis   . Asthma   . Cancer ([place])    skin,lung  . COPD (chronic obstructive pulmonary disease) ([place])   . GERD (gastroesophageal reflux disease)   . History of bronchitis   . Hypertension   . Mixed hyperlipidemia   . Nocturia   . Shortness of breath dyspnea    with exertion  . Sleep apnea    will sometimes wear a CPAP  . Spinal stenosis     ALLERGIES:  is allergic to adhesive [tape] and sulfa antibiotics.  MEDICATIONS:  Current Outpatient Medications  Medication Sig Dispense Refill  . acetaminophen (TYLENOL) 650 MG CR tablet Take 1,300 mg by mouth every 8 (eight) hours as needed for pain.    Marland Kitchen albuterol (PROVENTIL HFA;VENTOLIN HFA) 108 (90 Base) MCG/ACT inhaler Inhale 2 puffs into the lungs every 6 (six) hours as needed for wheezing. 3 Inhaler 2  . amLODipine (NORVASC) 10 MG  tablet TAKE 1 TABLET DAILY (Patient taking differently: Take 10 mg by mouth daily. ) 90 tablet 1  . Ascorbic Acid (VITAMIN C) 1000 MG tablet Take 1,000 mg by mouth daily.    Marland Kitchen aspirin 81 MG tablet Take 81 mg by mouth daily.    Marland Kitchen atorvastatin (LIPITOR) 80 MG tablet Take 1 tablet (80 mg total) by mouth daily at 6 PM. (Patient taking differently: Take 80 mg by mouth at bedtime. ) 90 tablet 2  . azelastine (ASTELIN) 0.1 % nasal spray Place 1 spray into both nostrils 2 (two) times daily. Use in each nostril as directed (Patient taking differently: Place 1 spray into both nostrils 2 (two) times daily as needed for rhinitis or allergies. Use in each nostril as directed) 30 mL 12  . budesonide-formoterol (SYMBICORT) 160-4.5 MCG/ACT inhaler Inhale 2 puffs into the lungs 2 (two) times daily.    . Carboxymethylcellulose Sod PF (THERATEARS) 1 % GEL Place 1 drop into both eyes as needed (dry eyes).    . Coenzyme Q10 (COQ10) 400 MG CAPS Take 2 capsules by mouth daily.     . Flaxseed, Linseed, (FLAXSEED OIL) 1000 MG CAPS Take 1 capsule by mouth daily.    . fluticasone (FLONASE) 50 MCG/ACT nasal spray USE 1 SPRAY IN EACH NOSTRIL DAILY AS NEEDED (Patient taking differently: Place 1 spray into both nostrils daily as needed for allergies. ) 16 g 4  . gabapentin (  NEURONTIN) 100 MG capsule Take 200-300 mg by mouth at bedtime.     Marland Kitchen HYDROcodone-acetaminophen (NORCO/VICODIN) 5-325 MG tablet Take 1 tablet by mouth every 6 (six) hours as needed for moderate pain.    . hydrOXYzine (ATARAX/VISTARIL) 10 MG tablet Take 10 mg by mouth at bedtime.     Marland Kitchen ibuprofen (ADVIL,MOTRIN) 200 MG tablet Take 800 mg by mouth 2 (two) times daily as needed.     . lidocaine (XYLOCAINE) 5 % ointment Apply 1 application topically as needed for mild pain (For Back and hip).     . loratadine (CLARITIN) 10 MG tablet Take 10 mg by mouth daily as needed for allergies.    . Magnesium 250 MG TABS Take 2 tablets by mouth daily.     . meloxicam (MOBIC) 15  MG tablet Take 15 mg by mouth at bedtime.    Marland Kitchen omeprazole (PRILOSEC) 20 MG capsule TAKE 1 CAPSULE DAILY AS NEEDED (Patient taking differently: Take 20 mg by mouth daily as needed (heartburn). TAKE 1 CAPSULE DAILY AS NEEDED) 90 capsule 1  . tiotropium (SPIRIVA HANDIHALER) 18 MCG inhalation capsule Place 1 capsule (18 mcg total) into inhaler and inhale daily. 30 capsule 0  . Turmeric Curcumin 500 MG CAPS Take 1,000 mg by mouth daily.      No current facility-administered medications for this visit.     SURGICAL HISTORY:  Past Surgical History:  Procedure Laterality Date  . CIRCUMCISION    . COLONOSCOPY  03/04/2004   [id number] diverticula.  The remainder of the colonic mucosa appeared normal/normal rectum  . COLONOSCOPY  05/22/2009   SPQ:[id number] mucosa appeared normal/Left-sided diverticula/Normal rectum  . COLONOSCOPY N/A 08/02/2014   Procedure: COLONOSCOPY;  Surgeon: Daneil Dolin, MD;  [facility];  Service: Endoscopy;  Laterality: N/A;  2:00 PM  . KNEE ARTHROSCOPY Right 2004  . LYMPH NODE DISSECTION N/A 04/08/2016   Procedure: LYMPH NODE DISSECTION;  Surgeon: Melrose Nakayama, MD;  Location: [facility];  Service: Thoracic;  Laterality: N/A;  . SEPTOPLASTY    . VIDEO ASSISTED THORACOSCOPY (VATS)/WEDGE RESECTION Left 04/08/2016   Procedure: VIDEO ASSISTED THORACOSCOPY (VATS)/WEDGE RESECTION;  Surgeon: Melrose Nakayama, MD;  Location: [facility];  Service: Thoracic;  Laterality: Left;  Marland Kitchen VIDEO BRONCHOSCOPY WITH ENDOBRONCHIAL NAVIGATION N/A 02/26/2016   Procedure: VIDEO BRONCHOSCOPY WITH ENDOBRONCHIAL NAVIGATION LEFT UPPER LOBE LUNG NODULE;  Surgeon: [name], MD;  Location: [facility];  Service: Thoracic;  Laterality: N/A;    REVIEW OF SYSTEMS:  A comprehensive review of systems was negative except for: Respiratory: positive for dyspnea on exertion   PHYSICAL EXAMINATION: General appearance: alert, cooperative and no distress Head: Normocephalic, without obvious abnormality,  atraumatic Neck: no adenopathy, no JVD, supple, symmetrical, trachea midline and thyroid not enlarged, symmetric, no tenderness/mass/nodules Lymph nodes: Cervical, supraclavicular, and axillary nodes normal. Resp: clear to auscultation bilaterally Back: symmetric, no curvature. ROM normal. No CVA tenderness. Cardio: regular rate and rhythm, S1, S2 normal, no murmur, click, rub or gallop GI: soft, non-tender; bowel sounds normal; no masses,  no organomegaly Extremities: extremities normal, atraumatic, no cyanosis or edema  ECOG PERFORMANCE STATUS: 1 - Symptomatic but completely ambulatory  Blood pressure 131/68, pulse 82, temperature 98.3 F (36.8 C), temperature source Oral, resp. rate 18, height '5\' 6"'$  (1.676 m), weight 203 lb 9.6 oz (92.4 kg), SpO2 97 %.  LABORATORY DATA: Lab Results  Component Value Date   WBC 5.6 05/26/2019   HGB 13.3 05/26/2019   HCT 40.9 05/26/2019  MCV 90.7 05/26/2019   PLT 235 05/26/2019      Chemistry      Component Value Date/Time   NA 142 05/26/2019 1007   NA 141 04/27/2019 0931   NA 140 05/18/2017 1056   K 3.9 05/26/2019 1007   K 4.0 05/18/2017 1056   CL 106 05/26/2019 1007   CO2 27 05/26/2019 1007   CO2 27 05/18/2017 1056   BUN 19 05/26/2019 1007   BUN 18 04/27/2019 0931   BUN 16.8 05/18/2017 1056   CREATININE 1.14 05/26/2019 1007   CREATININE 1.1 05/18/2017 1056      Component Value Date/Time   CALCIUM 8.7 (L) 05/26/2019 1007   CALCIUM 10.3 05/18/2017 1056   ALKPHOS 100 05/26/2019 1007   ALKPHOS 92 05/18/2017 1056   AST 22 05/26/2019 1007   AST 20 05/18/2017 1056   ALT 17 05/26/2019 1007   ALT 20 05/18/2017 1056   BILITOT 0.5 05/26/2019 1007   BILITOT 0.72 05/18/2017 1056       RADIOGRAPHIC STUDIES: Ct Chest W Contrast  Result Date: 05/26/2019 CLINICAL DATA:  Status post left upper lobectomy. History of lung cancer. Restaging. EXAM: CT CHEST WITH CONTRAST TECHNIQUE: Multidetector CT imaging of the chest was performed during  intravenous contrast administration. CONTRAST:  68m OMNIPAQUE IOHEXOL 300 MG/ML  SOLN COMPARISON:  CT chest 05/24/18 FINDINGS: Cardiovascular: Heart size normal. No pericardial effusion. Aortic atherosclerosis. Three vessel coronary artery calcifications. Mediastinum/Nodes: Normal appearance of the thyroid gland. The trachea appears patent and is midline. Normal appearance of the esophagus. No enlarged mediastinal lymph nodes. Stable appearance of 1.3 cm right hilar node, image 84/2. Lungs/Pleura: No pleural effusion. Moderate changes of emphysema. Postoperative changes within the posterior left upper lobe from prior wedge resection noted. Unchanged appearance of nodular thickening along the suture line and left major fissure. This measures 1.3 by 1.0 cm, image 57/7. Previously 1.1 x 1.8 cm stable tiny perifissural nodule along the right minor fissure measuring 3 mm, image 78/7. Posterior right lower lobe lung nodule is unchanged measuring 4 mm, image 86/7. Upper Abdomen: No acute abnormality. Musculoskeletal: Multi level spondylosis identified within the thoracic spine. No aggressive lytic or sclerotic bone lesions. IMPRESSION: 1. Stable exam without evidence of local recurrence or metastatic disease. 2. Unchanged nodular thickening along the suture line and left major fissure. 3. Aortic Atherosclerosis (ICD10-I70.0) and Emphysema (ICD10-J43.9). Coronary artery calcifications. Electronically Signed   By: TKerby MoorsM.D.   On: 05/26/2019 14:02    ASSESSMENT AND PLAN: This is a very pleasant 73years old white male with a stage IA non-small cell lung cancer status post left upper lobe lingular segmentectomy in May 2017. The patient is currently on observation and he is feeling fine with no concerning complaints. He had repeat CT scan of the chest performed recently.  I personally and independently reviewed the scans and discussed the results with the patient today. His a scan showed no concerning findings  for disease recurrence. I recommended for the patient to continue on observation with repeat CT scan of the chest in 1 year. He was advised to call immediately if he has any concerning symptoms in the interval. The patient voices understanding of current disease status and treatment options and is in agreement with the current care plan. All questions were answered. The patient knows to call the clinic with any problems, questions or concerns. We can certainly see the patient much sooner if necessary. I spent 10 minutes counseling the patient face to face.  The total time spent in the appointment was 15 minutes.  Disclaimer: This note was dictated with voice recognition software. Similar sounding words can inadvertently be transcribed and may not be corrected upon review.

## 2019-06-05 NOTE — Telephone Encounter (Signed)
Scheduled appt per 7/27 los - mailed letter with appt date and time - central radiology to contact pt with scan apt.

## 2019-10-02 ENCOUNTER — Ambulatory Visit: Payer: Medicare Other | Admitting: Family Medicine

## 2019-10-02 ENCOUNTER — Ambulatory Visit (INDEPENDENT_AMBULATORY_CARE_PROVIDER_SITE_OTHER): Payer: Medicare Other | Admitting: Family Medicine

## 2019-10-02 ENCOUNTER — Other Ambulatory Visit: Payer: Self-pay

## 2019-10-02 DIAGNOSIS — Z20822 Contact with and (suspected) exposure to covid-19: Secondary | ICD-10-CM

## 2019-10-02 DIAGNOSIS — Z20828 Contact with and (suspected) exposure to other viral communicable diseases: Secondary | ICD-10-CM

## 2019-10-02 MED ORDER — AZITHROMYCIN 250 MG PO TABS: ORAL_TABLET | ORAL | 0 refills | Status: DC

## 2019-10-02 NOTE — Progress Notes (Signed)
   Subjective:    Patient ID: Miguel Morales, male    DOB: 25-Jun-1946, 73 y.o.   MRN: [id number]  Sinus Problem This is a new problem. The current episode started in the past 7 days (started Thursday). Associated symptoms include congestion, coughing, sinus pressure and a sore throat. (Diarrhea, nausea, low grade fever and muscle aches) Past treatments include acetaminophen.   [facility] Significant sinus congestion a little bit of diarrhea no wheezing or difficulty breathing patient does have risk factors for Covid  Review of Systems  HENT: Positive for congestion, sinus pressure and sore throat.   Respiratory: Positive for cough.    Virtual Visit via Video Note  I connected with [name] on 10/02/19 at  3:00 PM EST by a video enabled telemedicine application and verified that I am speaking with the correct person using two identifiers.  Location: Patient: home Provider: office   I discussed the limitations of evaluation and management by telemedicine and the availability of in person appointments. The patient expressed understanding and agreed to proceed.  History of Present Illness:    Observations/Objective:   Assessment and Plan:   Follow Up Instructions:    I discussed the assessment and treatment plan with the patient. The patient was provided an opportunity to ask questions and all were answered. The patient agreed with the plan and demonstrated an understanding of the instructions.   The patient was advised to call back or seek an in-person evaluation if the symptoms worsen or if the condition fails to improve as anticipated.  I provided 16 minutes of non-face-to-face time during this encounter.        Objective:   Physical Exam Patient had virtual visit Appears to be in no distress Atraumatic Neuro able to relate and oriented No apparent resp distress Color normal        Assessment & Plan:  Covid  infection Very important to the patient do the best he can at [facility] how he is doing If he starts getting significant shortness of breath chest pains disorientation or worse go to ER immediately Z-Pak for COPD bronchitis congestion and coughing Patient requested hydroxychloroquine but is not felt that that is beneficial and this was deferred

## 2019-10-07 ENCOUNTER — Encounter: Payer: Self-pay | Admitting: Family Medicine

## 2019-10-09 ENCOUNTER — Encounter: Payer: Self-pay | Admitting: Family Medicine

## 2019-10-09 ENCOUNTER — Emergency Department (HOSPITAL_BASED_OUTPATIENT_CLINIC_OR_DEPARTMENT_OTHER)
Admission: EM | Admit: 2019-10-09 | Discharge: 2019-10-09 | Disposition: A | Discharge status: Home / Self Care | Payer: Medicare Other | Attending: Emergency Medicine | Admitting: Emergency Medicine

## 2019-10-09 ENCOUNTER — Other Ambulatory Visit: Payer: Self-pay

## 2019-10-09 ENCOUNTER — Encounter (HOSPITAL_BASED_OUTPATIENT_CLINIC_OR_DEPARTMENT_OTHER): Payer: Self-pay | Admitting: *Deleted

## 2019-10-09 ENCOUNTER — Ambulatory Visit: Payer: Medicare Other | Admitting: Family Medicine

## 2019-10-09 ENCOUNTER — Emergency Department (HOSPITAL_BASED_OUTPATIENT_CLINIC_OR_DEPARTMENT_OTHER): Payer: Medicare Other

## 2019-10-09 DIAGNOSIS — Z79899 Other long term (current) drug therapy: Secondary | ICD-10-CM | POA: Diagnosis not present

## 2019-10-09 DIAGNOSIS — R0602 Shortness of breath: Secondary | ICD-10-CM | POA: Diagnosis not present

## 2019-10-09 DIAGNOSIS — I10 Essential (primary) hypertension: Secondary | ICD-10-CM | POA: Insufficient documentation

## 2019-10-09 DIAGNOSIS — Z85118 Personal history of other malignant neoplasm of bronchus and lung: Secondary | ICD-10-CM | POA: Diagnosis not present

## 2019-10-09 DIAGNOSIS — J449 Chronic obstructive pulmonary disease, unspecified: Secondary | ICD-10-CM | POA: Insufficient documentation

## 2019-10-09 DIAGNOSIS — Z882 Allergy status to sulfonamides status: Secondary | ICD-10-CM | POA: Diagnosis not present

## 2019-10-09 DIAGNOSIS — Z7982 Long term (current) use of aspirin: Secondary | ICD-10-CM | POA: Diagnosis not present

## 2019-10-09 DIAGNOSIS — U071 COVID-19: Secondary | ICD-10-CM | POA: Insufficient documentation

## 2019-10-09 DIAGNOSIS — E876 Hypokalemia: Secondary | ICD-10-CM | POA: Diagnosis not present

## 2019-10-09 DIAGNOSIS — Z91048 Other nonmedicinal substance allergy status: Secondary | ICD-10-CM | POA: Diagnosis not present

## 2019-10-09 DIAGNOSIS — E782 Mixed hyperlipidemia: Secondary | ICD-10-CM | POA: Diagnosis not present

## 2019-10-09 DIAGNOSIS — Z87891 Personal history of nicotine dependence: Secondary | ICD-10-CM | POA: Diagnosis not present

## 2019-10-09 LAB — POCT I-STAT EG7
Bicarbonate: 23.8 mmol/L (ref 20.0–28.0)
Calcium, Ion: 1.23 mmol/L (ref 1.15–1.40)
HCT: 37 % — ABNORMAL LOW (ref 39.0–52.0)
Hemoglobin: 12.6 g/dL — ABNORMAL LOW (ref 13.0–17.0)
O2 Saturation: 79 %
Potassium: 3.2 mmol/L — ABNORMAL LOW (ref 3.5–5.1)
Sodium: 137 mmol/L (ref 135–145)
TCO2: 25 mmol/L (ref 22–32)
pCO2, Ven: 34 mmHg — ABNORMAL LOW (ref 44.0–60.0)
pH, Ven: 7.453 — ABNORMAL HIGH (ref 7.250–7.430)
pO2, Ven: 41 mmHg (ref 32.0–45.0)

## 2019-10-09 LAB — CBC WITH DIFFERENTIAL/PLATELET
Abs Immature Granulocytes: 0.04 10*3/uL (ref 0.00–0.07)
Basophils Absolute: 0 10*3/uL (ref 0.0–0.1)
Basophils Relative: 0 %
Eosinophils Absolute: 0 10*3/uL (ref 0.0–0.5)
Eosinophils Relative: 0 %
HCT: 39.4 % (ref 39.0–52.0)
Hemoglobin: 13.3 g/dL (ref 13.0–17.0)
Immature Granulocytes: 1 %
Lymphocytes Relative: 13 %
Lymphs Abs: 0.8 10*3/uL (ref 0.7–4.0)
MCH: 30.5 pg (ref 26.0–34.0)
MCHC: 33.8 g/dL (ref 30.0–36.0)
MCV: 90.4 fL (ref 80.0–100.0)
Monocytes Absolute: 0.5 10*3/uL (ref 0.1–1.0)
Monocytes Relative: 7 %
Neutro Abs: 4.9 10*3/uL (ref 1.7–7.7)
Neutrophils Relative %: 79 %
Platelets: 314 10*3/uL (ref 150–400)
RBC: 4.36 MIL/uL (ref 4.22–5.81)
RDW: 13 % (ref 11.5–15.5)
WBC: 6.2 10*3/uL (ref 4.0–10.5)
nRBC: 0 % (ref 0.0–0.2)

## 2019-10-09 LAB — COMPREHENSIVE METABOLIC PANEL
ALT: 22 U/L (ref 0–44)
AST: 33 U/L (ref 15–41)
Albumin: 3.3 g/dL — ABNORMAL LOW (ref 3.5–5.0)
Alkaline Phosphatase: 65 U/L (ref 38–126)
Anion gap: 10 (ref 5–15)
BUN: 14 mg/dL (ref 8–23)
CO2: 24 mmol/L (ref 22–32)
Calcium: 8.6 mg/dL — ABNORMAL LOW (ref 8.9–10.3)
Chloride: 104 mmol/L (ref 98–111)
Creatinine, Ser: 0.83 mg/dL (ref 0.61–1.24)
GFR calc Af Amer: >60 mL/min (ref >60)
GFR calc non Af Amer: >60 mL/min (ref >60)
Glucose, Bld: 152 mg/dL — ABNORMAL HIGH (ref 70–99)
Potassium: 3.1 mmol/L — ABNORMAL LOW (ref 3.5–5.1)
Sodium: 138 mmol/L (ref 135–145)
Total Bilirubin: 0.6 mg/dL (ref 0.3–1.2)
Total Protein: 7 g/dL (ref 6.5–8.1)

## 2019-10-09 MED ORDER — DOXYCYCLINE HYCLATE 100 MG PO CAPS: 100.0000 mg | ORAL_CAPSULE | Freq: Two times a day (BID) | ORAL | 0 refills | Status: AC

## 2019-10-09 MED ORDER — SODIUM CHLORIDE 0.9 % IV BOLUS
1000.0000 mL | Freq: Once | INTRAVENOUS | Status: AC
  Administered 2019-10-09: 1000 mL via INTRAVENOUS

## 2019-10-09 MED ORDER — POTASSIUM CHLORIDE CRYS ER 20 MEQ PO TBCR
20.0000 meq | EXTENDED_RELEASE_TABLET | Freq: Once | ORAL | Status: AC
  Administered 2019-10-09: 20 meq via ORAL
  Filled 2019-10-09: qty 1

## 2019-10-09 NOTE — ED Provider Notes (Signed)
[facility] EMERGENCY DEPARTMENT Provider Note   CSN: [id number] Arrival date & time: 10/09/19  1121     History   Chief Complaint Chief Complaint  Patient presents with  . Covid Positive    HPI Miguel Morales is a 73 y.o. 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  Patient reports that he developed symptoms of congestion and runny nose approximately 12 days ago.  8 days ago he tested positive for Covid.  At that time he began having some shortness of breath, generalized weakness and fatigue, diminished appetite.  He has been able to keep down food and fluid but not as much as he would like.  He states his primary care doctor started him on a 5-day course of azithromycin which he has completed yesterday.  He had 2 loose bowel movements today.  He denies any worsening shortness of breath at rest, states he does feel more short of breath than usual with ambulation.  He quit smoking 10 years ago.  He denies orthopnea or leg swelling.  He denies any history of blood clots.    HPI  Past Medical History:  Diagnosis Date  . Arthritis   . Asthma   . Cancer ([place])    skin,lung  . COPD (chronic obstructive pulmonary disease) ([place])   . GERD (gastroesophageal reflux disease)   . History of bronchitis   . Hypertension   . Mixed hyperlipidemia   . Nocturia   . Shortness of breath dyspnea    with exertion  . Sleep apnea    will sometimes wear a CPAP  . Spinal stenosis     Patient Active Problem List   Diagnosis Date Noted  . Spondylolisthesis at L4-L5 level 01/06/2019  . Need for prophylactic vaccination and inoculation against influenza 07/21/2016  . Nodule of left lung 04/08/2016  . Moderate COPD (chronic obstructive pulmonary disease) ([place]) 03/18/2016  .  Non-small cell carcinoma of left lung, stage 1 ([place]) 03/18/2016  . Dyspnea 07/04/2014  . Lung nodule 07/04/2014  . Hyperglycemia 04/12/2014  . Lung nodule 56mm Left Upper Lobe Ground Glass and 28mm Right Right Lower lobe - seen April 2015 and NEW problem 02/25/2014  . Cancer screening 01/13/2014  . Lumbar stenosis with neurogenic claudication 11/07/2013  . Sacroiliac joint dysfunction 11/07/2013  . HTN (hypertension), benign 10/24/2013  . Spinal stenosis, lumbar region, with neurogenic claudication 06/20/2013  . Shortness of breath 03/23/2012  . Mixed hyperlipidemia 03/23/2012    Past Surgical History:  Procedure Laterality Date  . CIRCUMCISION    . COLONOSCOPY  03/04/2004   BTD:[id number] diverticula.  The remainder of the colonic mucosa appeared normal/normal rectum  . COLONOSCOPY  05/22/2009   TGG:YIRSWNI mucosa appeared normal/Left-sided diverticula/Normal rectum  . COLONOSCOPY N/A 08/02/2014   Procedure: COLONOSCOPY;  Surgeon: Daneil Dolin, MD;  [facility];  Service: Endoscopy;  Laterality: N/A;  2:00 PM  . KNEE ARTHROSCOPY Right 2004  . LYMPH NODE DISSECTION N/A 04/08/2016   Procedure: LYMPH NODE DISSECTION;  Surgeon: Melrose Nakayama, MD;  Location: [facility];  Service: Thoracic;  Laterality: N/A;  . SEPTOPLASTY    . VIDEO ASSISTED THORACOSCOPY (VATS)/WEDGE RESECTION Left 04/08/2016   Procedure: VIDEO ASSISTED THORACOSCOPY (VATS)/WEDGE RESECTION;  Surgeon: Melrose Nakayama, MD;  Location: [facility];  Service:  Thoracic;  Laterality: Left;  Marland Kitchen VIDEO BRONCHOSCOPY WITH ENDOBRONCHIAL NAVIGATION N/A 02/26/2016   Procedure: VIDEO BRONCHOSCOPY WITH ENDOBRONCHIAL NAVIGATION LEFT UPPER LOBE LUNG NODULE;  Surgeon: [name], MD;  Location: [facility];  Service: Thoracic;  Laterality: N/A;        Home Medications    Prior to Admission medications   Medication Sig Start Date End Date Taking? Authorizing Provider  acetaminophen (TYLENOL) 650 MG CR tablet Take 1,300 mg by  mouth every 8 (eight) hours as needed for pain.    [provider]  albuterol (PROVENTIL HFA;VENTOLIN HFA) 108 (90 Base) MCG/ACT inhaler Inhale 2 puffs into the lungs every 6 (six) hours as needed for wheezing. 12/23/15   [name], MD  amLODipine (NORVASC) 10 MG tablet TAKE 1 TABLET DAILY Patient taking differently: Take 10 mg by mouth daily.  07/01/16   [name], MD  Ascorbic Acid (VITAMIN C) 1000 MG tablet Take 1,000 mg by mouth daily.    [provider]  aspirin 81 MG tablet Take 81 mg by mouth daily.    [provider]  atorvastatin (LIPITOR) 40 MG tablet Take 1 tablet by mouth daily. 05/24/19   [provider]  azelastine (ASTELIN) 0.1 % nasal spray Place 1 spray into both nostrils 2 (two) times daily. Use in each nostril as directed Patient taking differently: Place 1 spray into both nostrils 2 (two) times daily as needed for rhinitis or allergies. Use in each nostril as directed 03/18/18   [name], MD  azithromycin (ZITHROMAX Z-PAK) 250 MG tablet Take 2 tablets (500 mg) on  Day 1,  followed by 1 tablet (250 mg) once daily on Days 2 through 5. 10/02/19   Luking, Elayne Snare, MD  budesonide-formoterol (SYMBICORT) 160-4.5 MCG/ACT inhaler Inhale 2 puffs into the lungs 2 (two) times daily.    [provider]  Carboxymethylcellulose Sod PF (THERATEARS) 1 % GEL Place 1 drop into both eyes as needed (dry eyes).    [provider]  Coenzyme Q10 (COQ10) 400 MG CAPS Take 2 capsules by mouth daily.     [provider]  doxycycline (VIBRAMYCIN) 100 MG capsule Take 1 capsule (100 mg total) by mouth 2 (two) times daily for 5 days. 10/10/19 10/15/19  Wyvonnia Dusky, MD  Flaxseed, Linseed, (FLAXSEED OIL) 1000 MG CAPS Take 1 capsule by mouth daily.    [provider]  fluticasone (FLONASE) 50 MCG/ACT nasal spray USE 1 SPRAY IN EACH NOSTRIL DAILY AS NEEDED Patient taking differently: Place 1 spray into both nostrils daily as  needed for allergies.  04/27/16   [name], MD  gabapentin (NEURONTIN) 100 MG capsule Take 200-300 mg by mouth at bedtime.  11/18/18   [provider]  hydrOXYzine (ATARAX/VISTARIL) 10 MG tablet Take 10 mg by mouth at bedtime.     [provider]  ibuprofen (ADVIL,MOTRIN) 200 MG tablet Take 800 mg by mouth 2 (two) times daily as needed.     [provider]  lidocaine (XYLOCAINE) 5 % ointment Apply 1 application topically as needed for mild pain (For Back and hip).     [provider]  loratadine (CLARITIN) 10 MG tablet Take 10 mg by mouth daily as needed for allergies.    [provider]  Magnesium 250 MG TABS Take 2 tablets by mouth daily.     [provider]  meloxicam (MOBIC) 15 MG tablet Take 15 mg by mouth at bedtime.  [provider]  omeprazole (PRILOSEC) 20 MG capsule TAKE 1 CAPSULE DAILY AS NEEDED Patient taking differently: Take 20 mg by mouth daily as needed (heartburn). TAKE 1 CAPSULE DAILY AS NEEDED 09/21/16   [name], MD  tiotropium (SPIRIVA HANDIHALER) 18 MCG inhalation capsule Place 1 capsule (18 mcg total) into inhaler and inhale daily. 01/31/19   [name], MD  Turmeric Curcumin 500 MG CAPS Take 1,000 mg by mouth daily.     [provider]    Family History Family History  Problem Relation Age of Onset  . Cancer Mother     Social History Social History   Tobacco Use  . Smoking status: Former Smoker    Packs/day: 1.00    Years: 30.00    Pack years: 30.00    Types: Cigarettes    Start date: 11/09/1958    Quit date: 11/10/2003    Years since quitting: 15.9  . Smokeless tobacco: Never Used  Substance Use Topics  . Alcohol use: Yes    Alcohol/week: 0.0 standard drinks    Comment: Occasionally  . Drug use: No     Allergies   Adhesive [tape] and Sulfa antibiotics   Review of Systems Review of Systems  Constitutional: Positive for appetite change and fatigue. Negative for  chills and fever.  HENT: Positive for congestion and sore throat. Negative for ear pain.   Respiratory: Positive for shortness of breath. Negative for cough, choking, chest tightness and wheezing.   Cardiovascular: Negative for chest pain, palpitations and leg swelling.  Gastrointestinal: Positive for diarrhea and nausea. Negative for abdominal pain, constipation and vomiting.  Skin: Negative for color change and rash.  Neurological: Negative for syncope and headaches.  Psychiatric/Behavioral: Negative for agitation and confusion.  All other systems reviewed and are negative.    Physical Exam Updated Vital Signs BP 139/78   Pulse 90   Temp 98.5 F (36.9 C) (Oral)   Resp (!) 23   Ht 5\' 6"  (1.676 m)   Wt 90.7 kg   SpO2 96%   BMI 32.28 kg/m   Physical Exam Vitals signs and nursing note reviewed.  Constitutional:      Appearance: He is well-developed.  HENT:     Head: Normocephalic and atraumatic.  Eyes:     Conjunctiva/sclera: Conjunctivae normal.  Neck:     Musculoskeletal: Neck supple.  Cardiovascular:     Rate and Rhythm: Normal rate and regular rhythm.     Pulses: Normal pulses.  Pulmonary:     Effort: Pulmonary effort is normal. No respiratory distress.     Breath sounds: Normal breath sounds.     Comments: Crackles in right lower lung field Chest:     Chest wall: No tenderness.  Abdominal:     General: There is no distension.     Palpations: Abdomen is soft.     Tenderness: There is no abdominal tenderness.  Skin:    General: Skin is warm and dry.  Neurological:     General: No focal deficit present.     Mental Status: He is alert and oriented to person, place, and time.  Psychiatric:        Mood and Affect: Mood normal.        Behavior: Behavior normal.      ED Treatments / Results  Labs (all labs ordered are listed, but only abnormal results are displayed) Labs Reviewed  COMPREHENSIVE METABOLIC PANEL - Abnormal; Notable for the following  components:  Result Value   Potassium 3.1 (*)    Glucose, Bld 152 (*)    Calcium 8.6 (*)    Albumin 3.3 (*)    All other components within normal limits  POCT I-STAT EG7 - Abnormal; Notable for the following components:   pH, Ven 7.453 (*)    pCO2, Ven 34.0 (*)    Potassium 3.2 (*)    HCT 37.0 (*)    Hemoglobin 12.6 (*)    All other components within normal limits  CBC WITH DIFFERENTIAL/PLATELET  I-STAT VENOUS BLOOD GAS, ED    EKG None  Radiology Dg Chest Port 1 View  Result Date: 10/09/2019 CLINICAL DATA:  Coronavirus infection. COPD and lung cancer. Short of breath. EXAM: PORTABLE CHEST 1 VIEW COMPARISON:  02/14/2018 FINDINGS: Chronic cardiomegaly and aortic atherosclerosis. Previous lobectomy on the left. Mild patchy bilateral pulmonary densities but no dense consolidation, collapse or effusion. IMPRESSION: Cardiomegaly and aortic atherosclerosis. Previous left lobectomy. Patchy/hazy bilateral infiltrates without dense consolidation or collapse. Electronically Signed   By: Nelson Chimes M.D.   On: 10/09/2019 12:25    Procedures Procedures (including critical care time)  Medications Ordered in ED Medications  sodium chloride 0.9 % bolus 1,000 mL ( Intravenous Stopped 10/09/19 1407)  potassium chloride SA (KLOR-CON) CR tablet 20 mEq (20 mEq Oral Given 10/09/19 1512)     Initial Impression / Assessment and Plan / ED Course  I have reviewed the triage vital signs and the nursing notes.  Pertinent labs & imaging results that were available during my care of the patient were reviewed by me and considered in my medical decision making (see chart for details).  This is a 73 year old gentleman with a history as noted above presented to emergency department with fatigue, mild shortness of breath, diarrhea and myalgias, approximately 11 to 12 days since symptom onset of his COVID-19.  He has no evidence of acute respiratory distress or failure on my exam.  The patient appears  quite comfortable in the room.  He is satting 100% on room air.  He has no wheezing suggestive of COPD exacerbation.  He does feel like he may be having a hard time keeping up with his fluids and feels he may be dehydrated.  We will check his electrolyte levels here.  Also obtain a chest x-ray of the lungs.      * Patient updated regarding hypokalemia prior to discharge.  Advised food intake, bananas, and have this rechecked as an outpatient.  Advised continued fluids.    Discussed watch and wait antibiotics (doxycycline) for PNA, which may yet develop, although clinically he does not demonstrate signs of bacterial infection at this time.     Miguel Morales was evaluated in Emergency Department on 10/09/2019 for the symptoms described in the history of present illness. He was evaluated in the context of the global COVID-19 pandemic, which necessitated consideration that the patient might be at risk for infection with the SARS-CoV-2 virus that causes COVID-19. Institutional protocols and algorithms that pertain to the evaluation of patients at risk for COVID-19 are in a state of rapid change based on information released by regulatory bodies including the CDC and federal and state organizations. These policies and algorithms were followed during the patient's care in the ED.        Final Clinical Impressions(s) / ED Diagnoses   Final diagnoses:  COVID-19  Hypokalemia    ED Discharge Orders         Ordered  doxycycline (VIBRAMYCIN) 100 MG capsule  2 times daily     10/09/19 1432           Wyvonnia Dusky, MD 10/09/19 1724

## 2019-10-09 NOTE — Discharge Instructions (Signed)
Your potassium level was 3.1 today, which is a little bit low.  We gave you some potassium and I recommended that you eat 1 banana per day, and that your primary care provider recheck your potassium level in 1-2 weeks.  It would be a good idea to have your doctor recheck your overall symptoms in that time period too.  Covid symptoms can last up to 30 days in some people - keep drinking LOTS of water.  I also wrote you a prescription for an antibiotic called doxycycline.  I advised you that if you begin having fevers at home, or if your cough becomes thick and green, you can begin taking this antibiotic for bacterial pneumonia.  At this time in the ER it does not appear that you have a bacterial pneumonia, but that can change.

## 2019-10-09 NOTE — ED Triage Notes (Signed)
Covid positive. Hx of COPD and lung cancer. Partial eft lung removed. C/o SOB.

## 2019-10-10 ENCOUNTER — Telehealth: Payer: Self-pay | Admitting: Family Medicine

## 2019-10-10 NOTE — Telephone Encounter (Signed)
I did talk with patient regarding how he is doing we will follow up again tomorrow with follow-up phone call his O2 saturation 93% he states his breathing is doing okay

## 2019-10-10 NOTE — Telephone Encounter (Signed)
Patient has Covid  He was in the ER yesterday and received IV fluids  please call patient See how he is doing in regards to eating, drinking, breathing

## 2019-10-10 NOTE — Telephone Encounter (Signed)
Pt states he is doing better today. States he is eating and drinking better than he was but could be better. He ate a couple of pancakes for breaksfast and some soup for lunch. He is breathing ok as long as he doesn't exert himself. Pt has concerns about his cxr results and wanted to know what dr Nicki Reaper thought of the cxr. Pt concerned it showed pneumonia but dr at hospital told him he didn't. States he has a prescription for antibiotic but the dr told him not to fill it right now so he has not started it.

## 2019-10-12 NOTE — Telephone Encounter (Signed)
I did speak with patient today he is starting to do better O2 saturation 92, 93% no warning signs occurring currently

## 2019-12-15 ENCOUNTER — Telehealth: Payer: Self-pay | Admitting: Family Medicine

## 2019-12-15 DIAGNOSIS — R05 Cough: Secondary | ICD-10-CM

## 2019-12-15 DIAGNOSIS — C3490 Malignant neoplasm of unspecified part of unspecified bronchus or lung: Secondary | ICD-10-CM

## 2019-12-15 DIAGNOSIS — R053 Chronic cough: Secondary | ICD-10-CM

## 2019-12-15 DIAGNOSIS — R9389 Abnormal findings on diagnostic imaging of other specified body structures: Secondary | ICD-10-CM

## 2019-12-15 NOTE — Addendum Note (Signed)
Addended by: Vicente Males on: 12/15/2019 04:08 PM   Modules accepted: Orders

## 2019-12-15 NOTE — Telephone Encounter (Signed)
Please let the patient know that we did communicate with his oncologist His oncologist recommends setting him up for a CT scan We will need to do a virtual visit early next week in order to provide proper documentation to get his CT scan approved Go ahead and place order for CT scan of the chest with contrast to reason lung cancer history plus abnormal chest x-ray plus chronic cough Set up virtual visit with me for next week

## 2019-12-15 NOTE — Telephone Encounter (Signed)
Nurses Please let the patient know that I did receive the information that he sent to me regarding his [place] visit.  I also reviewed over his last CAT scan that he had in July plus also his last visit he had with the oncologist in July.  At that time they recommended a follow-up CAT scan this coming July.  I reviewed over what the [place] stated regarding the chest x-ray they did.  Let the patient know that what I would recommend at this point is we will connect with his oncologist on whether or not the oncologist would want [place] to do a CT scan currently or do a follow-up chest x-ray  I will connect with them and typically they give [place] feedback relatively soon as soon as I hear feedback I will make sure that P is aware of what their plan is  If [name] does not hear back from me within the next 10 days please have [name] connect with our office  Also as for his lab work that the [place] did-it looked good

## 2019-12-15 NOTE — Telephone Encounter (Signed)
CT scan of chest placed in Epic. Pt is aware and was transferred up front to set up appt for early next week. Pt verbalized understanding.

## 2019-12-15 NOTE — Telephone Encounter (Signed)
Pt contacted and verbalized understanding. Pt states he has been coughing a lot more in the past 2 months. He could not remember if he put that in the information he sent to you. Pt states this is an occasional cough with little to no phelgm, no color in phelgm.

## 2019-12-18 ENCOUNTER — Encounter: Payer: Self-pay | Admitting: Family Medicine

## 2019-12-18 ENCOUNTER — Telehealth: Payer: Self-pay | Admitting: Family Medicine

## 2019-12-18 DIAGNOSIS — Z79899 Other long term (current) drug therapy: Secondary | ICD-10-CM

## 2019-12-18 NOTE — Telephone Encounter (Signed)
Pt needs lab work due to upcoming CT of chest with contrast  Please call pt once orders are placed

## 2019-12-18 NOTE — Telephone Encounter (Signed)
Metabolic 7 to be done before procedure

## 2019-12-19 ENCOUNTER — Other Ambulatory Visit: Payer: Self-pay

## 2019-12-19 ENCOUNTER — Ambulatory Visit (INDEPENDENT_AMBULATORY_CARE_PROVIDER_SITE_OTHER): Payer: Medicare Other | Admitting: Family Medicine

## 2019-12-19 DIAGNOSIS — J449 Chronic obstructive pulmonary disease, unspecified: Secondary | ICD-10-CM

## 2019-12-19 NOTE — Telephone Encounter (Signed)
Blood work ordered in Epic. Patient notified. 

## 2019-12-19 NOTE — Progress Notes (Signed)
   Subjective:    Patient ID: Miguel Morales, male    DOB: 1946/05/03, 74 y.o.   MRN: [id number]  HPIhas been having more coughing in the last few weeks. Also having some hoarseness. Ct chest with contrast scheduled for 2/23.  Patient with intermittent hoarseness has seen specialist for this also having some increased coughing has a history of lung cancer with previous surgery has surveillance CAT scans on a regular basis but patient recently connected me with lab work as well as chest x-ray from the [place] these were reviewed and orders were initiated last week with our recommendation to do an office visit this week his lab work shows stable kidney function stable CBC and cholesterol acceptable Virtual Visit via Telephone Note  I connected with [name] on 12/19/19 at 10:30 AM EST by telephone and verified that I am speaking with the correct person using two identifiers.  Location: Patient: home Provider: office   I discussed the limitations, risks, security and privacy concerns of performing an evaluation and management service by telephone and the availability of in person appointments. I also discussed with the patient that there may be a patient responsible charge related to this service. The patient expressed understanding and agreed to proceed.   History of Present Illness:    Observations/Objective:   Assessment and Plan:   Follow Up Instructions:    I discussed the assessment and treatment plan with the patient. The patient was provided an opportunity to ask questions and all were answered. The patient agreed with the plan and demonstrated an understanding of the instructions.   The patient was advised to call back or seek an in-person evaluation if the symptoms worsen or if the condition fails to improve as anticipated.  I provided 30 minutes of non-face-to-face time during this encounter.     Review of Systems  Constitutional: Negative for activity change, fatigue  and fever.  HENT: Negative for congestion and rhinorrhea.   Respiratory: Positive for cough. Negative for shortness of breath.   Cardiovascular: Negative for chest pain and leg swelling.  Gastrointestinal: Negative for abdominal pain, diarrhea and nausea.  Genitourinary: Negative for dysuria and hematuria.  Neurological: Negative for weakness and headaches.  Psychiatric/Behavioral: Negative for agitation and behavioral problems.       Objective:   Physical Exam  Today's visit was via telephone Physical exam was not possible for this visit       Assessment & Plan:  COPD stable using medication on a regular basis continue these measures Persistent cough more so in the morning with a history of lung cancer this needs further looking into Chest x-ray from [place] showed some scarring or possibility of an infiltrate Needs further investigation I did speak with oncologist who recommended going forward with CAT scan Patient will do the CAT scan on the 23rd and will do lab work Await the results of this Need to rule out the possibility of reoccurrence of lung cancer patient aware and understands

## 2019-12-26 DIAGNOSIS — Z79899 Other long term (current) drug therapy: Secondary | ICD-10-CM | POA: Diagnosis not present

## 2019-12-27 LAB — BASIC METABOLIC PANEL
BUN/Creatinine Ratio: 14 (ref 10–24)
BUN: 14 mg/dL (ref 8–27)
CO2: 22 mmol/L (ref 20–29)
Calcium: 9.3 mg/dL (ref 8.6–10.2)
Chloride: 105 mmol/L (ref 96–106)
Creatinine, Ser: 1.02 mg/dL (ref 0.76–1.27)
GFR calc Af Amer: 83 mL/min/{1.73_m2} (ref >59)
GFR calc non Af Amer: 72 mL/min/{1.73_m2} (ref >59)
Glucose: 120 mg/dL — ABNORMAL HIGH (ref 65–99)
Potassium: 4 mmol/L (ref 3.5–5.2)
Sodium: 142 mmol/L (ref 134–144)

## 2019-12-30 ENCOUNTER — Ambulatory Visit
Admission: EM | Admit: 2019-12-30 | Discharge: 2019-12-30 | Disposition: A | Discharge status: Home / Self Care | Payer: Medicare Other | Attending: Emergency Medicine | Admitting: Emergency Medicine

## 2019-12-30 ENCOUNTER — Other Ambulatory Visit: Payer: Self-pay

## 2019-12-30 DIAGNOSIS — H02843 Edema of right eye, unspecified eyelid: Secondary | ICD-10-CM

## 2019-12-30 DIAGNOSIS — H00012 Hordeolum externum right lower eyelid: Secondary | ICD-10-CM | POA: Diagnosis not present

## 2019-12-30 MED ORDER — ERYTHROMYCIN 5 MG/GM OP OINT: TOPICAL_OINTMENT | OPHTHALMIC | 0 refills | Status: DC

## 2019-12-30 NOTE — Discharge Instructions (Signed)
Continue warm compresses at home.  Soak a wash cloth in warm (not scalding) water and place it over the eyes. As the wash cloth cools, it should be rewarmed and replaced for a total of 5 to 10 minutes of soaking time. Warm compresses should be applied two to four times a day as long as the patient has symptoms Perform lid washing: Either warm water or very dilute baby shampoo can be placed on a clean wash cloth, gauze pad, or cotton swab. Then be advised to gently clean along the lashes and lid margin to remove the accumulated material with care to avoid contacting the ocular surface. If shampoo is used, thorough rinsing is recommended. Vigorous washing should be avoided, as it may cause more irritation.  Prescribed erythromycin ointment.  Apply up to 6 times daily for 5-7 days, or until symptomatic improvement Follow up with ophthalmology for further evaluation and management if symptoms persists Return or go to ER if you have any new or worsening symptoms such as fever, chills, redness, swelling, eye pain, painful eye movements, vision changes, etc..Marland Kitchen

## 2019-12-30 NOTE — ED Provider Notes (Signed)
[place]   [id number] 12/30/19 Arrival Time: 1501  CC: RT eyelid swelling and pain  SUBJECTIVE:  Miguel Morales is a 74 y.o. male who presents with complaint of RT lower eyelid pain, swelling and redness that began 1 week ago.  Denies a precipitating event, trauma, or close contacts with similar symptoms.  Has tried OTC eye drops without relief.  Symptoms are made worse to the touch.  Denies similar symptoms in the past.  Complains of mild discharge and itching.  Denies fever, chills, nausea, vomiting, eye pain, painful eye movements, vision changes, double vision, FB sensation, periorbital erythema.    Denies contact lens use.    ROS: As per HPI.  All other pertinent ROS negative.     Past Medical History:  Diagnosis Date  . Arthritis   . Asthma   . Cancer ([place])    skin,lung  . COPD (chronic obstructive pulmonary disease) ([place])   . GERD (gastroesophageal reflux disease)   . History of bronchitis   . Hypertension   . Mixed hyperlipidemia   . Nocturia   . Shortness of breath dyspnea    with exertion  . Sleep apnea    will sometimes wear a CPAP  . Spinal stenosis    Past Surgical History:  Procedure Laterality Date  . CIRCUMCISION    . COLONOSCOPY  03/04/2004   IRC:VELF-YBOFB diverticula.  The remainder of the colonic mucosa appeared normal/normal rectum  . COLONOSCOPY  05/22/2009   PZW:CHENIDP mucosa appeared normal/Left-sided diverticula/Normal rectum  . COLONOSCOPY N/A 08/02/2014   Procedure: COLONOSCOPY;  Surgeon: Daneil Dolin, MD;  [facility];  Service: Endoscopy;  Laterality: N/A;  2:00 PM  . KNEE ARTHROSCOPY Right 2004  . LYMPH NODE DISSECTION N/A 04/08/2016   Procedure: LYMPH NODE DISSECTION;  Surgeon: Melrose Nakayama, MD;  Location: [facility];  Service: Thoracic;  Laterality: N/A;  . SEPTOPLASTY    . VIDEO ASSISTED THORACOSCOPY (VATS)/WEDGE RESECTION Left 04/08/2016   Procedure: VIDEO ASSISTED THORACOSCOPY (VATS)/WEDGE RESECTION;   Surgeon: Melrose Nakayama, MD;  Location: [facility];  Service: Thoracic;  Laterality: Left;  Marland Kitchen VIDEO BRONCHOSCOPY WITH ENDOBRONCHIAL NAVIGATION N/A 02/26/2016   Procedure: VIDEO BRONCHOSCOPY WITH ENDOBRONCHIAL NAVIGATION LEFT UPPER LOBE LUNG NODULE;  Surgeon: [name], MD;  Location: [facility];  Service: Thoracic;  Laterality: N/A;   Allergies  Allergen Reactions  . Adhesive [Tape] Other (See Comments)    blisters  . Sulfa Antibiotics Hives   No current facility-administered medications on file prior to encounter.   Current Outpatient Medications on File Prior to Encounter  Medication Sig Dispense Refill  . acetaminophen (TYLENOL) 650 MG CR tablet Take 1,300 mg by mouth every 8 (eight) hours as needed for pain.    Marland Kitchen albuterol (PROVENTIL HFA;VENTOLIN HFA) 108 (90 Base) MCG/ACT inhaler Inhale 2 puffs into the lungs every 6 (six) hours as needed for wheezing. 3 Inhaler 2  . amLODipine (NORVASC) 10 MG tablet TAKE 1 TABLET DAILY (Patient taking differently: Take 10 mg by mouth daily. ) 90 tablet 1  . Ascorbic Acid (VITAMIN C) 1000 MG tablet Take 1,000 mg by mouth daily.    Marland Kitchen aspirin 81 MG tablet Take 81 mg by mouth daily.    Marland Kitchen atorvastatin (LIPITOR) 40 MG tablet Take 1 tablet by mouth daily.    Marland Kitchen azelastine (ASTELIN) 0.1 % nasal spray Place 1 spray into both nostrils 2 (two) times daily. Use in each nostril as directed (Patient taking differently: Place 1  spray into both nostrils 2 (two) times daily as needed for rhinitis or allergies. Use in each nostril as directed) 30 mL 12  . budesonide-formoterol (SYMBICORT) 160-4.5 MCG/ACT inhaler Inhale 2 puffs into the lungs 2 (two) times daily.    . Carboxymethylcellulose Sod PF (THERATEARS) 1 % GEL Place 1 drop into both eyes as needed (dry eyes).    . fluticasone (FLONASE) 50 MCG/ACT nasal spray USE 1 SPRAY IN EACH NOSTRIL DAILY AS NEEDED (Patient taking differently: Place 1 spray into both nostrils daily as needed for allergies. ) 16 g 4  . gabapentin  (NEURONTIN) 100 MG capsule Take 200-300 mg by mouth at bedtime.     . hydrOXYzine (ATARAX/VISTARIL) 10 MG tablet Take 10 mg by mouth at bedtime.     Marland Kitchen ibuprofen (ADVIL,MOTRIN) 200 MG tablet Take 800 mg by mouth 2 (two) times daily as needed.     . lidocaine (XYLOCAINE) 5 % ointment Apply 1 application topically as needed for mild pain (For Back and hip).     . loratadine (CLARITIN) 10 MG tablet Take 10 mg by mouth daily as needed for allergies.    . meloxicam (MOBIC) 15 MG tablet Take 15 mg by mouth at bedtime.    Marland Kitchen omeprazole (PRILOSEC) 20 MG capsule TAKE 1 CAPSULE DAILY AS NEEDED (Patient taking differently: Take 20 mg by mouth daily as needed (heartburn). TAKE 1 CAPSULE DAILY AS NEEDED) 90 capsule 1  . OVER THE COUNTER MEDICATION Vit d one bid    . tiotropium (SPIRIVA HANDIHALER) 18 MCG inhalation capsule Place 1 capsule (18 mcg total) into inhaler and inhale daily. 30 capsule 0   Social History   Socioeconomic History  . Marital status: Married    Spouse name: Not on file  . Number of children: Not on file  . Years of education: Not on file  . Highest education level: Not on file  Occupational History  . Not on file  Tobacco Use  . Smoking status: Former Smoker    Packs/day: 1.00    Years: 30.00    Pack years: 30.00    Types: Cigarettes    Start date: 11/09/1958    Quit date: 11/10/2003    Years since quitting: 16.1  . Smokeless tobacco: Never Used  Substance and Sexual Activity  . Alcohol use: Yes    Alcohol/week: 0.0 standard drinks    Comment: Occasionally  . Drug use: No  . Sexual activity: Not on file  Other Topics Concern  . Not on file  Social History Narrative  . Not on file   Social Determinants of Health   Financial Resource Strain:   . Difficulty of Paying Living Expenses: Not on file  Food Insecurity:   . Worried About Charity fundraiser in the Last Year: Not on file  . Ran Out of Food in the Last Year: Not on file  Transportation Needs:   . Lack of  Transportation (Medical): Not on file  . Lack of Transportation (Non-Medical): Not on file  Physical Activity:   . Days of Exercise per Week: Not on file  . Minutes of Exercise per Session: Not on file  Stress:   . Feeling of Stress : Not on file  Social Connections:   . Frequency of Communication with Friends and Family: Not on file  . Frequency of Social Gatherings with Friends and Family: Not on file  . Attends Religious Services: Not on file  . Active Member of Clubs or Organizations: Not  on file  . Attends Archivist Meetings: Not on file  . Marital Status: Not on file  Intimate Partner Violence:   . Fear of Current or Ex-Partner: Not on file  . Emotionally Abused: Not on file  . Physically Abused: Not on file  . Sexually Abused: Not on file   Family History  Problem Relation Age of Onset  . Cancer Mother     OBJECTIVE:  Vitals:   12/30/19 1514  BP: 131/76  Pulse: 89  Resp: 18  Temp: 98.3 F (36.8 C)  SpO2: 95%    General appearance: alert; no distress Eyes: RT lower eyelid with swelling and erythema, stye present, mildly TTP over RT lower eyelid; trace conjunctival erythema. PERRL; EOMI without discomfort;  no obvious drainage HENT: hearing aids present; EACs clear, TMS pearly gray; nares patent; oropharynx clear Neck: supple Lungs: clear to auscultation bilaterally Heart: regular rate and rhythm Skin: warm and dry Psychological: alert and cooperative; normal mood and affect  ASSESSMENT & PLAN:  1. Hordeolum externum of right lower eyelid   2. Pain and swelling of eyelid of right eye     Meds ordered this encounter  Medications  . erythromycin ophthalmic ointment    Sig: Place a 1/2 inch ribbon of ointment into the RT lower eyelid.    Dispense:  3.5 g    Refill:  0    Order Specific Question:   Supervising Provider    Answer:   Raylene Everts [[id number]]   Continue warm compresses at home.  Soak a wash cloth in warm (not scalding) water  and place it over the eyes. As the wash cloth cools, it should be rewarmed and replaced for a total of 5 to 10 minutes of soaking time. Warm compresses should be applied two to four times a day as long as the patient has symptoms Perform lid washing: Either warm water or very dilute baby shampoo can be placed on a clean wash cloth, gauze pad, or cotton swab. Then be advised to gently clean along the lashes and lid margin to remove the accumulated material with care to avoid contacting the ocular surface. If shampoo is used, thorough rinsing is recommended. Vigorous washing should be avoided, as it may cause more irritation.  Prescribed erythromycin ointment.  Apply up to 6 times daily for 5-7 days, or until symptomatic improvement Follow up with ophthalmology for further evaluation and management if symptoms persists Return or go to ER if you have any new or worsening symptoms such as fever, chills, redness, swelling, eye pain, painful eye movements, vision changes, etc...  Reviewed expectations re: course of current medical issues. Questions answered. Outlined signs and symptoms indicating need for more acute intervention. Patient verbalized understanding. After Visit Summary given.   Lestine Box, PA-C 12/30/19 1533

## 2019-12-30 NOTE — ED Triage Notes (Signed)
Pt presents with right eye pain and swelling that began about a week ago, unknown if injury, lower right lid is red and swollen

## 2020-01-02 ENCOUNTER — Ambulatory Visit (HOSPITAL_COMMUNITY)
Admission: RE | Admit: 2020-01-02 | Discharge: 2020-01-02 | Disposition: A | Discharge status: Home / Self Care | Payer: Medicare Other | Source: Ambulatory Visit | Attending: Family Medicine | Admitting: Family Medicine

## 2020-01-02 ENCOUNTER — Encounter: Payer: Self-pay | Admitting: Family Medicine

## 2020-01-02 ENCOUNTER — Other Ambulatory Visit: Payer: Self-pay

## 2020-01-02 DIAGNOSIS — C3490 Malignant neoplasm of unspecified part of unspecified bronchus or lung: Secondary | ICD-10-CM | POA: Diagnosis not present

## 2020-01-02 DIAGNOSIS — J439 Emphysema, unspecified: Secondary | ICD-10-CM | POA: Diagnosis not present

## 2020-01-02 DIAGNOSIS — I700 Atherosclerosis of aorta: Secondary | ICD-10-CM

## 2020-01-02 DIAGNOSIS — R9389 Abnormal findings on diagnostic imaging of other specified body structures: Secondary | ICD-10-CM | POA: Insufficient documentation

## 2020-01-02 DIAGNOSIS — R05 Cough: Secondary | ICD-10-CM | POA: Diagnosis not present

## 2020-01-02 HISTORY — DX: Atherosclerosis of aorta: I70.0

## 2020-01-02 MED ORDER — IOHEXOL 300 MG/ML  SOLN
75.0000 mL | Freq: Once | INTRAMUSCULAR | Status: AC | PRN
  Administered 2020-01-02: 10:00:00 75 mL via INTRAVENOUS

## 2020-02-29 ENCOUNTER — Telehealth: Payer: Self-pay | Admitting: *Deleted

## 2020-02-29 ENCOUNTER — Other Ambulatory Visit: Payer: Self-pay

## 2020-02-29 ENCOUNTER — Encounter: Payer: Self-pay | Admitting: Family Medicine

## 2020-02-29 ENCOUNTER — Telehealth (INDEPENDENT_AMBULATORY_CARE_PROVIDER_SITE_OTHER): Payer: Medicare Other | Admitting: Family Medicine

## 2020-02-29 DIAGNOSIS — J019 Acute sinusitis, unspecified: Secondary | ICD-10-CM | POA: Diagnosis not present

## 2020-02-29 MED ORDER — AMOXICILLIN-POT CLAVULANATE 875-125 MG PO TABS: 1.0000 | ORAL_TABLET | Freq: Two times a day (BID) | ORAL | 0 refills | Status: DC

## 2020-02-29 NOTE — Telephone Encounter (Signed)
[name], Miguel Morales are scheduled for a virtual visit with your provider today.    Just as we do with appointments in the office, we must obtain your consent to participate.  Your consent will be active for this visit and any virtual visit you may have with one of our providers in the next 365 days.    If you have a MyChart account, I can also send a copy of this consent to you electronically.  All virtual visits are billed to your insurance company just like a traditional visit in the office.  As this is a virtual visit, video technology does not allow for your provider to perform a traditional examination.  This may limit your provider's ability to fully assess your condition.  If your provider identifies any concerns that need to be evaluated in person or the need to arrange testing such as labs, EKG, etc, we will make arrangements to do so.    Although advances in technology are sophisticated, we cannot ensure that it will always work on either your end or our end.  If the connection with a video visit is poor, we may have to switch to a telephone visit.  With either a video or telephone visit, we are not always able to ensure that we have a secure connection.   I need to obtain your verbal consent now.   Are you willing to proceed with your visit today?   Miguel Morales has provided verbal consent on 02/29/2020 for a virtual visit (video or telephone).   Dayton Bailiff, LPN 2/90/2111  5:52 AM

## 2020-02-29 NOTE — Progress Notes (Addendum)
   Subjective:    Patient ID: Miguel Morales, male    DOB: 1946/10/28, 74 y.o.   MRN: [id number]  Sinusitis This is a new problem. Episode onset: 5 days. Associated symptoms include congestion, coughing and headaches. Pertinent negatives include no chills or ear pain. Treatments tried: sinus rinse.  Head congestion drainage sinus pressure denies high fever chills sweats denies wheezing difficulty breathing patient has had Covid previously Virtual Visit via Telephone Note  I connected with Miguel Morales on 02/29/20 at  9:30 AM EDT by telephone and verified that I am speaking with the correct person using two identifiers.  Location: Patient: home Provider: office   I discussed the limitations, risks, security and privacy concerns of performing an evaluation and management service by telephone and the availability of in person appointments. I also discussed with the patient that there may be a patient responsible charge related to this service. The patient expressed understanding and agreed to proceed.  Fall Risk  02/29/2020 01/10/2018 10/14/2017 09/01/2016 07/10/2016  Falls in the past year? 0 No No No Yes  Comment - - [facility] Telephone Survey: data to providers prior to load - [facility]i Telephone Survey: data to providers prior to load  Number falls in past yr: - - - - 2 or more  Comment - - - - Emmi Telephone Survey Actual Response = 90  Injury with Fall? - - - - No  Risk for fall due to : - - - (No Data) -  Risk for fall due to: Comment - - - No history -    History of Present Illness:    Observations/Objective:   Assessment and Plan:   Follow Up Instructions:    I discussed the assessment and treatment plan with the patient. The patient was provided an opportunity to ask questions and all were answered. The patient agreed with the plan and demonstrated an understanding of the instructions.   The patient was advised to call back or seek an in-person evaluation if the symptoms worsen or if  the condition fails to improve as anticipated.  I provided 18 minutes of non-face-to-face time during this encounter.       Review of Systems  Constitutional: Negative for activity change, chills and fever.  HENT: Positive for congestion and rhinorrhea. Negative for ear pain.   Eyes: Negative for discharge.  Respiratory: Positive for cough. Negative for wheezing.   Cardiovascular: Negative for chest pain.  Gastrointestinal: Negative for nausea and vomiting.  Musculoskeletal: Negative for arthralgias.  Neurological: Positive for headaches.       Objective:   Physical Exam  Virtual unable to do physical exam      Assessment & Plan:  Acute rhinosinusitis antibiotics prescribed warning signs discussed follow-up if problems  Allergic rhinitis allergy medicines recommended

## 2020-04-24 ENCOUNTER — Telehealth: Payer: Self-pay | Admitting: Family Medicine

## 2020-04-24 DIAGNOSIS — R739 Hyperglycemia, unspecified: Secondary | ICD-10-CM

## 2020-04-24 DIAGNOSIS — Z79899 Other long term (current) drug therapy: Secondary | ICD-10-CM

## 2020-04-24 DIAGNOSIS — E785 Hyperlipidemia, unspecified: Secondary | ICD-10-CM

## 2020-04-24 DIAGNOSIS — I10 Essential (primary) hypertension: Secondary | ICD-10-CM

## 2020-04-24 NOTE — Telephone Encounter (Signed)
Lipid, liver, metabolic 7, V3X Hyperlipidemia, hypertension, prediabetes

## 2020-04-24 NOTE — Telephone Encounter (Signed)
Pt returned call and verbalized understanding  

## 2020-04-24 NOTE — Telephone Encounter (Signed)
Patient has physical on 6/23 and needing labs done

## 2020-04-24 NOTE — Telephone Encounter (Signed)
Labs ordered- lmtc.

## 2020-04-24 NOTE — Telephone Encounter (Signed)
Last completed labs on 04/27/2019 CBC BMET and Lipid. Please advise. Thank you

## 2020-04-25 DIAGNOSIS — I10 Essential (primary) hypertension: Secondary | ICD-10-CM | POA: Diagnosis not present

## 2020-04-25 DIAGNOSIS — Z79899 Other long term (current) drug therapy: Secondary | ICD-10-CM | POA: Diagnosis not present

## 2020-04-25 DIAGNOSIS — E785 Hyperlipidemia, unspecified: Secondary | ICD-10-CM | POA: Diagnosis not present

## 2020-04-25 DIAGNOSIS — R739 Hyperglycemia, unspecified: Secondary | ICD-10-CM | POA: Diagnosis not present

## 2020-04-26 LAB — LIPID PANEL
Chol/HDL Ratio: 4.2 ratio (ref 0.0–5.0)
Cholesterol, Total: 157 mg/dL (ref 100–199)
HDL: 37 mg/dL — ABNORMAL LOW (ref >39)
LDL Chol Calc (NIH): 94 mg/dL (ref 0–99)
Triglycerides: 149 mg/dL (ref 0–149)
VLDL Cholesterol Cal: 26 mg/dL (ref 5–40)

## 2020-04-26 LAB — BASIC METABOLIC PANEL
BUN/Creatinine Ratio: 14 (ref 10–24)
BUN: 14 mg/dL (ref 8–27)
CO2: 22 mmol/L (ref 20–29)
Calcium: 10.1 mg/dL (ref 8.6–10.2)
Chloride: 103 mmol/L (ref 96–106)
Creatinine, Ser: 1.02 mg/dL (ref 0.76–1.27)
GFR calc Af Amer: 82 mL/min/{1.73_m2} (ref >59)
GFR calc non Af Amer: 71 mL/min/{1.73_m2} (ref >59)
Glucose: 136 mg/dL — ABNORMAL HIGH (ref 65–99)
Potassium: 4.3 mmol/L (ref 3.5–5.2)
Sodium: 141 mmol/L (ref 134–144)

## 2020-04-26 LAB — HEPATIC FUNCTION PANEL
ALT: 17 IU/L (ref 0–44)
AST: 20 IU/L (ref 0–40)
Albumin: 4.6 g/dL (ref 3.7–4.7)
Alkaline Phosphatase: 92 IU/L (ref 48–121)
Bilirubin Total: 0.6 mg/dL (ref 0.0–1.2)
Bilirubin, Direct: 0.17 mg/dL (ref 0.00–0.40)
Total Protein: 6.9 g/dL (ref 6.0–8.5)

## 2020-04-26 LAB — HEMOGLOBIN A1C
Est. average glucose Bld gHb Est-mCnc: 140 mg/dL
Hgb A1c MFr Bld: 6.5 % — ABNORMAL HIGH (ref 4.8–5.6)

## 2020-05-01 ENCOUNTER — Other Ambulatory Visit: Payer: Self-pay

## 2020-05-01 ENCOUNTER — Ambulatory Visit (INDEPENDENT_AMBULATORY_CARE_PROVIDER_SITE_OTHER): Payer: Medicare Other | Admitting: Family Medicine

## 2020-05-01 ENCOUNTER — Encounter: Payer: Self-pay | Admitting: Family Medicine

## 2020-05-01 VITALS — BP 130/86 | Temp 97.5°F | Ht 64.5 in | Wt 204.2 lb

## 2020-05-01 DIAGNOSIS — Z125 Encounter for screening for malignant neoplasm of prostate: Secondary | ICD-10-CM | POA: Diagnosis not present

## 2020-05-01 DIAGNOSIS — Z0000 Encounter for general adult medical examination without abnormal findings: Secondary | ICD-10-CM | POA: Diagnosis not present

## 2020-05-01 DIAGNOSIS — E1169 Type 2 diabetes mellitus with other specified complication: Secondary | ICD-10-CM

## 2020-05-01 DIAGNOSIS — E785 Hyperlipidemia, unspecified: Secondary | ICD-10-CM

## 2020-05-01 MED ORDER — ATORVASTATIN CALCIUM 80 MG PO TABS
80.0000 mg | ORAL_TABLET | Freq: Every day | ORAL | 5 refills | Status: AC
Start: 2020-05-01 — End: ?

## 2020-05-01 NOTE — Progress Notes (Signed)
Subjective:    Patient ID: Miguel Morales, male    DOB: [birth year], 74 y.o.   MRN: [id number]  HPI AWV- Annual Wellness Visit  The patient was seen for their annual wellness visit. The patient's past medical history, surgical history, and family history were reviewed. Pertinent vaccines were reviewed ( tetanus, pneumonia, shingles, flu) The patient's medication list was reviewed and updated.  The height and weight were entered.  BMI recorded in electronic record elsewhere  Cognitive screening was completed. Outcome of Mini - Cog: pass   Falls /depression screening electronically recorded within record elsewhere  Current tobacco usage: none (All patients who use tobacco were given written and verbal information on quitting)  Recent listing of emergency department/hospitalizations over the past year were reviewed.  current specialist the patient sees on a regular basis: dermatology through [facility] annual wellness visit patient questionnaire was reviewed.  A written screening schedule for the patient for the next 5-10 years was given. Appropriate discussion of followup regarding next visit was discussed.  Results for orders placed or performed in visit on 01/75/10  Basic metabolic panel  Result Value Ref Range   Glucose 136 (H) 65 - 99 mg/dL   BUN 14 8 - 27 mg/dL   Creatinine, Ser 1.02 0.76 - 1.27 mg/dL   GFR calc non Af Amer 71 >59 mL/min/1.73   GFR calc Af Amer 82 >59 mL/min/1.73   BUN/Creatinine Ratio 14 10 - 24   Sodium 141 134 - 144 mmol/L   Potassium 4.3 3.5 - 5.2 mmol/L   Chloride 103 96 - 106 mmol/L   CO2 22 20 - 29 mmol/L   Calcium 10.1 8.6 - 10.2 mg/dL  Hepatic function panel  Result Value Ref Range   Total Protein 6.9 6.0 - 8.5 g/dL   Albumin 4.6 3.7 - 4.7 g/dL   Bilirubin Total 0.6 0.0 - 1.2 mg/dL   Bilirubin, Direct 0.17 0.00 - 0.40 mg/dL   Alkaline Phosphatase 92 48 - 121 IU/L   AST 20 0 - 40 IU/L   ALT 17 0 - 44 IU/L  Lipid panel  Result  Value Ref Range   Cholesterol, Total 157 100 - 199 mg/dL   Triglycerides 149 0 - 149 mg/dL   HDL 37 (L) >39 mg/dL   VLDL Cholesterol Cal 26 5 - 40 mg/dL   LDL Chol Calc (NIH) 94 0 - 99 mg/dL   Chol/HDL Ratio 4.2 0.0 - 5.0 ratio  Hemoglobin A1c  Result Value Ref Range   Hgb A1c MFr Bld 6.5 (H) 4.8 - 5.6 %   Est. average glucose Bld gHb Est-mCnc 140 mg/dL       Review of Systems  Constitutional: Negative for activity change, appetite change and fever.  HENT: Negative for congestion and rhinorrhea.   Eyes: Negative for discharge.  Respiratory: Negative for cough and wheezing.   Cardiovascular: Negative for chest pain.  Gastrointestinal: Negative for abdominal pain, blood in stool and vomiting.  Genitourinary: Negative for difficulty urinating and frequency.  Musculoskeletal: Negative for neck pain.  Skin: Negative for rash.  Allergic/Immunologic: Negative for environmental allergies and food allergies.  Neurological: Negative for weakness and headaches.  Psychiatric/Behavioral: Negative for agitation.       Objective:   Physical Exam Constitutional:      Appearance: He is well-developed.  HENT:     Head: Normocephalic and atraumatic.     Right Ear: External ear normal.     Left Ear: External ear normal.  Nose: Nose normal.  Eyes:     Pupils: Pupils are equal, round, and reactive to light.  Neck:     Thyroid: No thyromegaly.  Cardiovascular:     Rate and Rhythm: Normal rate and regular rhythm.     Heart sounds: Normal heart sounds. No murmur heard.   Pulmonary:     Effort: Pulmonary effort is normal. No respiratory distress.     Breath sounds: Normal breath sounds. No wheezing.  Abdominal:     General: Bowel sounds are normal. There is no distension.     Palpations: Abdomen is soft. There is no mass.     Tenderness: There is no abdominal tenderness.  Genitourinary:    Penis: Normal.   Musculoskeletal:        General: Normal range of motion.     Cervical  back: Normal range of motion and neck supple.  Lymphadenopathy:     Cervical: No cervical adenopathy.  Skin:    General: Skin is warm and dry.     Findings: No erythema.  Neurological:     Mental Status: He is alert.     Motor: No abnormal muscle tone.  Psychiatric:        Behavior: Behavior normal.        Judgment: Judgment normal.           Assessment & Plan:  1. Encounter for subsequent annual wellness visit (AWV) in Medicare patient This young patient was seen today for a wellness exam. Significant time was spent discussing the following items: -Developmental status for age was reviewed.  -Safety measures appropriate for age were discussed. -Review of immunizations was completed. The appropriate immunizations were discussed and ordered. -Dietary recommendations and physical activity recommendations were made. -Gen. health recommendations were reviewed -Discussion of growth parameters were also made with the family. -Questions regarding general health of the patient asked by the family were answered.  Discussion today regarding good health.  Staying physically active healthy diet. - Lipid Profile - Hemoglobin A1c - PSA  2. Hyperlipidemia, unspecified hyperlipidemia type LDL higher than what we would like to see to help prevent heart disease bump up the dose of the cholesterol medicine recheck lab work in the fall time goal is LDL below 70 - Lipid Profile - Hemoglobin A1c - PSA  3. Hyperlipidemia associated with type 2 diabetes mellitus ([place]) His A1c is up he prefers to work hard on dietary choices and healthy eating to see if that can get the A1c better then he would be willing to be on medication if the A1c does not improve - Lipid Profile - Hemoglobin A1c - PSA  4. Screening PSA (prostate specific antigen) PSA has been normal in the past patient voices desire to continue to monitor it we will check a PSA - Lipid Profile - Hemoglobin A1c - PSA

## 2020-06-03 ENCOUNTER — Inpatient Hospital Stay: Payer: Medicare Other | Attending: Internal Medicine

## 2020-06-03 ENCOUNTER — Other Ambulatory Visit: Payer: Self-pay

## 2020-06-03 ENCOUNTER — Ambulatory Visit (HOSPITAL_COMMUNITY)
Admission: RE | Admit: 2020-06-03 | Discharge: 2020-06-03 | Disposition: A | Discharge status: Home / Self Care | Payer: Medicare Other | Source: Ambulatory Visit | Attending: Internal Medicine | Admitting: Internal Medicine

## 2020-06-03 DIAGNOSIS — Z85118 Personal history of other malignant neoplasm of bronchus and lung: Secondary | ICD-10-CM | POA: Insufficient documentation

## 2020-06-03 DIAGNOSIS — C3490 Malignant neoplasm of unspecified part of unspecified bronchus or lung: Secondary | ICD-10-CM

## 2020-06-03 DIAGNOSIS — I2510 Atherosclerotic heart disease of native coronary artery without angina pectoris: Secondary | ICD-10-CM | POA: Diagnosis not present

## 2020-06-03 DIAGNOSIS — J439 Emphysema, unspecified: Secondary | ICD-10-CM | POA: Diagnosis not present

## 2020-06-03 DIAGNOSIS — I700 Atherosclerosis of aorta: Secondary | ICD-10-CM | POA: Diagnosis not present

## 2020-06-03 LAB — CBC WITH DIFFERENTIAL (CANCER CENTER ONLY)
Abs Immature Granulocytes: 0.04 10*3/uL (ref 0.00–0.07)
Basophils Absolute: 0 10*3/uL (ref 0.0–0.1)
Basophils Relative: 1 %
Eosinophils Absolute: 0.3 10*3/uL (ref 0.0–0.5)
Eosinophils Relative: 3 %
HCT: 42.7 % (ref 39.0–52.0)
Hemoglobin: 14.3 g/dL (ref 13.0–17.0)
Immature Granulocytes: 1 %
Lymphocytes Relative: 21 %
Lymphs Abs: 1.5 10*3/uL (ref 0.7–4.0)
MCH: 31.2 pg (ref 26.0–34.0)
MCHC: 33.5 g/dL (ref 30.0–36.0)
MCV: 93 fL (ref 80.0–100.0)
Monocytes Absolute: 0.7 10*3/uL (ref 0.1–1.0)
Monocytes Relative: 9 %
Neutro Abs: 4.8 10*3/uL (ref 1.7–7.7)
Neutrophils Relative %: 65 %
Platelet Count: 261 10*3/uL (ref 150–400)
RBC: 4.59 MIL/uL (ref 4.22–5.81)
RDW: 13.1 % (ref 11.5–15.5)
WBC Count: 7.4 10*3/uL (ref 4.0–10.5)
nRBC: 0 % (ref 0.0–0.2)

## 2020-06-03 LAB — CMP (CANCER CENTER ONLY)
ALT: 21 U/L (ref 0–44)
AST: 21 U/L (ref 15–41)
Albumin: 3.9 g/dL (ref 3.5–5.0)
Alkaline Phosphatase: 95 U/L (ref 38–126)
Anion gap: 11 (ref 5–15)
BUN: 14 mg/dL (ref 8–23)
CO2: 21 mmol/L — ABNORMAL LOW (ref 22–32)
Calcium: 9.9 mg/dL (ref 8.9–10.3)
Chloride: 108 mmol/L (ref 98–111)
Creatinine: 1.08 mg/dL (ref 0.61–1.24)
GFR, Est AFR Am: >60 mL/min (ref >60)
GFR, Estimated: >60 mL/min (ref >60)
Glucose, Bld: 119 mg/dL — ABNORMAL HIGH (ref 70–99)
Potassium: 4 mmol/L (ref 3.5–5.1)
Sodium: 140 mmol/L (ref 135–145)
Total Bilirubin: 0.8 mg/dL (ref 0.3–1.2)
Total Protein: 7.2 g/dL (ref 6.5–8.1)

## 2020-06-03 MED ORDER — IOHEXOL 300 MG/ML  SOLN
75.0000 mL | Freq: Once | INTRAMUSCULAR | Status: AC | PRN
  Administered 2020-06-03: 75 mL via INTRAVENOUS

## 2020-06-03 MED ORDER — SODIUM CHLORIDE (PF) 0.9 % IJ SOLN
INTRAMUSCULAR | Status: AC
  Filled 2020-06-03: qty 50

## 2020-06-04 ENCOUNTER — Ambulatory Visit (HOSPITAL_COMMUNITY): Payer: Medicare Other

## 2020-06-05 ENCOUNTER — Telehealth: Payer: Self-pay | Admitting: Internal Medicine

## 2020-06-05 ENCOUNTER — Other Ambulatory Visit: Payer: Self-pay

## 2020-06-05 ENCOUNTER — Inpatient Hospital Stay (HOSPITAL_BASED_OUTPATIENT_CLINIC_OR_DEPARTMENT_OTHER): Payer: Medicare Other | Admitting: Internal Medicine

## 2020-06-05 ENCOUNTER — Encounter: Payer: Self-pay | Admitting: Internal Medicine

## 2020-06-05 VITALS — BP 130/72 | HR 78 | Temp 97.7°F | Resp 18 | Ht 64.5 in | Wt 207.3 lb

## 2020-06-05 DIAGNOSIS — C3492 Malignant neoplasm of unspecified part of left bronchus or lung: Secondary | ICD-10-CM | POA: Diagnosis not present

## 2020-06-05 DIAGNOSIS — Z85118 Personal history of other malignant neoplasm of bronchus and lung: Secondary | ICD-10-CM | POA: Diagnosis not present

## 2020-06-05 DIAGNOSIS — I10 Essential (primary) hypertension: Secondary | ICD-10-CM | POA: Diagnosis not present

## 2020-06-05 DIAGNOSIS — C3490 Malignant neoplasm of unspecified part of unspecified bronchus or lung: Secondary | ICD-10-CM

## 2020-06-05 NOTE — Telephone Encounter (Signed)
Scheduled per 7/28 los. Printed avs and calendar for pt.

## 2020-06-05 NOTE — Progress Notes (Signed)
[place] [phone]   [phone]  OFFICE PROGRESS NOTE  Miguel Drown, MD [place] [address]  DIAGNOSIS: Stage IA (T1a, N0, M0) non-small cell lung cancer, adenocarcinoma with negative EGFR, ALK, ROS 1 and BRAF mutations diagnosed in May 2017. PDL 1 expression is 10%  PRIOR THERAPY: Status post left upper lobe lingular segmentectomy with lymph node dissection under the care of Dr. Roxan Hockey on 04/08/2016.  CURRENT THERAPY: Observation.  INTERVAL HISTORY: Miguel Morales 74 y.o. male returns to the clinic today for follow-up visit.  The patient is feeling fine today with no concerning complaints.  He denied having any chest pain, shortness of breath, cough or hemoptysis.  He has no recent weight loss or night sweats.  He has no nausea, vomiting, diarrhea or constipation.  He denied having any headache or visual changes.  The patient is here today for evaluation and repeat CT scan of the chest for restaging of his disease.  MEDICAL HISTORY: Past Medical History:  Diagnosis Date  . Aortic atherosclerosis ([place]) 01/02/2020   Seen on CT exam February 2021  . Arthritis   . Asthma   . Cancer ([place])    skin,lung  . COPD (chronic obstructive pulmonary disease) ([place])   . GERD (gastroesophageal reflux disease)   . History of bronchitis   . Hypertension   . Mixed hyperlipidemia   . Nocturia   . Shortness of breath dyspnea    with exertion  . Sleep apnea    will sometimes wear a CPAP  . Spinal stenosis     ALLERGIES:  is allergic to adhesive [tape] and sulfa antibiotics.  MEDICATIONS:  Current Outpatient Medications  Medication Sig Dispense Refill  . acetaminophen (TYLENOL) 650 MG CR tablet Take 1,300 mg by mouth every 8 (eight) hours as needed for pain.    Marland Kitchen albuterol (PROVENTIL HFA;VENTOLIN HFA) 108 (90 Base) MCG/ACT inhaler Inhale 2 puffs into the lungs every 6 (six) hours as needed for wheezing. 3 Inhaler 2   . amLODipine (NORVASC) 10 MG tablet TAKE 1 TABLET DAILY (Patient taking differently: Take 10 mg by mouth daily. ) 90 tablet 1  . Ascorbic Acid (VITAMIN C) 1000 MG tablet Take 1,000 mg by mouth daily.    Marland Kitchen aspirin 81 MG tablet Take 81 mg by mouth daily.    Marland Kitchen atorvastatin (LIPITOR) 80 MG tablet Take 1 tablet (80 mg total) by mouth daily. 30 tablet 5  . azelastine (ASTELIN) 0.1 % nasal spray Place 1 spray into both nostrils 2 (two) times daily. Use in each nostril as directed (Patient taking differently: Place 1 spray into both nostrils 2 (two) times daily as needed for rhinitis or allergies. Use in each nostril as directed) 30 mL 12  . budesonide-formoterol (SYMBICORT) 160-4.5 MCG/ACT inhaler Inhale 2 puffs into the lungs 2 (two) times daily.    . Carboxymethylcellulose Sod PF (THERATEARS) 1 % GEL Place 1 drop into both eyes as needed (dry eyes).    Marland Kitchen erythromycin ophthalmic ointment Place a 1/2 inch ribbon of ointment into the RT lower eyelid. 3.5 g 0  . fluticasone (FLONASE) 50 MCG/ACT nasal spray USE 1 SPRAY IN EACH NOSTRIL DAILY AS NEEDED (Patient taking differently: Place 1 spray into both nostrils daily as needed for allergies. ) 16 g 4  . gabapentin (NEURONTIN) 100 MG capsule Take 200-300 mg by mouth at bedtime.     . hydrOXYzine (ATARAX/VISTARIL) 10 MG tablet Take  10 mg by mouth at bedtime.     Marland Kitchen ibuprofen (ADVIL,MOTRIN) 200 MG tablet Take 800 mg by mouth 2 (two) times daily as needed.     . lidocaine (XYLOCAINE) 5 % ointment Apply 1 application topically as needed for mild pain (For Back and hip).     . loratadine (CLARITIN) 10 MG tablet Take 10 mg by mouth daily as needed for allergies.    . meloxicam (MOBIC) 15 MG tablet Take 15 mg by mouth at bedtime.    Marland Kitchen omeprazole (PRILOSEC) 20 MG capsule TAKE 1 CAPSULE DAILY AS NEEDED (Patient taking differently: Take 20 mg by mouth daily as needed (heartburn). TAKE 1 CAPSULE DAILY AS NEEDED) 90 capsule 1  . OVER THE COUNTER MEDICATION Vit d one bid     . tiotropium (SPIRIVA HANDIHALER) 18 MCG inhalation capsule Place 1 capsule (18 mcg total) into inhaler and inhale daily. 30 capsule 0   No current facility-administered medications for this visit.    SURGICAL HISTORY:  Past Surgical History:  Procedure Laterality Date  . CIRCUMCISION    . COLONOSCOPY  03/04/2004   [id number] diverticula.  The remainder of the colonic mucosa appeared normal/normal rectum  . COLONOSCOPY  05/22/2009   AQT:[id number] mucosa appeared normal/Left-sided diverticula/Normal rectum  . COLONOSCOPY N/A 08/02/2014   Procedure: COLONOSCOPY;  Surgeon: Daneil Dolin, MD;  [facility];  Service: Endoscopy;  Laterality: N/A;  2:00 PM  . KNEE ARTHROSCOPY Right 2004  . LYMPH NODE DISSECTION N/A 04/08/2016   Procedure: LYMPH NODE DISSECTION;  Surgeon: Melrose Nakayama, MD;  Location: [facility];  Service: Thoracic;  Laterality: N/A;  . SEPTOPLASTY    . VIDEO ASSISTED THORACOSCOPY (VATS)/WEDGE RESECTION Left 04/08/2016   Procedure: VIDEO ASSISTED THORACOSCOPY (VATS)/WEDGE RESECTION;  Surgeon: Melrose Nakayama, MD;  Location: [facility];  Service: Thoracic;  Laterality: Left;  Marland Kitchen VIDEO BRONCHOSCOPY WITH ENDOBRONCHIAL NAVIGATION N/A 02/26/2016   Procedure: VIDEO BRONCHOSCOPY WITH ENDOBRONCHIAL NAVIGATION LEFT UPPER LOBE LUNG NODULE;  Surgeon: [name], MD;  Location: [facility];  Service: Thoracic;  Laterality: N/A;    REVIEW OF SYSTEMS:  A comprehensive review of systems was negative.   PHYSICAL EXAMINATION: General appearance: alert, cooperative and no distress Head: Normocephalic, without obvious abnormality, atraumatic Neck: no adenopathy, no JVD, supple, symmetrical, trachea midline and thyroid not enlarged, symmetric, no tenderness/mass/nodules Lymph nodes: Cervical, supraclavicular, and axillary nodes normal. Resp: clear to auscultation bilaterally Back: symmetric, no curvature. ROM normal. No CVA tenderness. Cardio: regular rate and rhythm, S1, S2 normal,  no murmur, click, rub or gallop GI: soft, non-tender; bowel sounds normal; no masses,  no organomegaly Extremities: extremities normal, atraumatic, no cyanosis or edema  ECOG PERFORMANCE STATUS: 1 - Symptomatic but completely ambulatory  Blood pressure (!) 130/72, pulse 78, temperature 97.7 F (36.5 C), resp. rate 18, height 5' 4.5" (1.638 m), weight (!) 207 lb 4.8 oz (94 kg), SpO2 95 %.  LABORATORY DATA: Lab Results  Component Value Date   WBC 7.4 06/03/2020   HGB 14.3 06/03/2020   HCT 42.7 06/03/2020   MCV 93.0 06/03/2020   PLT 261 06/03/2020      Chemistry      Component Value Date/Time   NA 140 06/03/2020 0912   NA 141 04/25/2020 0919   NA 140 05/18/2017 1056   K 4.0 06/03/2020 0912   K 4.0 05/18/2017 1056   CL 108 06/03/2020 0912   CO2 21 (L) 06/03/2020 0912   CO2 27 05/18/2017 1056  BUN 14 06/03/2020 0912   BUN 14 04/25/2020 0919   BUN 16.8 05/18/2017 1056   CREATININE 1.08 06/03/2020 0912   CREATININE 1.1 05/18/2017 1056      Component Value Date/Time   CALCIUM 9.9 06/03/2020 0912   CALCIUM 10.3 05/18/2017 1056   ALKPHOS 95 06/03/2020 0912   ALKPHOS 92 05/18/2017 1056   AST 21 06/03/2020 0912   AST 20 05/18/2017 1056   ALT 21 06/03/2020 0912   ALT 20 05/18/2017 1056   BILITOT 0.8 06/03/2020 0912   BILITOT 0.72 05/18/2017 1056       RADIOGRAPHIC STUDIES: CT Chest W Contrast  Result Date: 06/03/2020 CLINICAL DATA:  Non-small-cell lung cancer.  Restaging. EXAM: CT CHEST WITH CONTRAST TECHNIQUE: Multidetector CT imaging of the chest was performed during intravenous contrast administration. CONTRAST:  32m OMNIPAQUE IOHEXOL 300 MG/ML  SOLN COMPARISON:  01/02/2020 FINDINGS: Cardiovascular: The heart size is normal. No substantial pericardial effusion. Coronary artery calcification is evident. Atherosclerotic calcification is noted in the wall of the thoracic aorta. Mediastinum/Nodes: No mediastinal lymphadenopathy. No evidence for gross hilar lymphadenopathy  although assessment is limited by the lack of intravenous contrast on today's study. The esophagus has normal imaging features. There is no axillary lymphadenopathy. Lungs/Pleura: Centrilobular emphsyema noted. No suspicious pulmonary nodule or mass. Surgical scarring again noted posterior left upper lobe. No focal airspace consolidation. No pleural effusion. Upper Abdomen: Stable tiny hypodensity medial segment left liver, likely benign. Musculoskeletal: No worrisome lytic or sclerotic osseous abnormality. IMPRESSION: 1. Stable exam. No new or progressive findings to suggest recurrent or metastatic disease. 2. Aortic Atherosclerosis (ICD10-I70.0) and Emphysema (ICD10-J43.9). Electronically Signed   By: EMisty StanleyM.D.   On: 06/03/2020 11:56    ASSESSMENT AND PLAN: This is a very pleasant 74years old white male with a stage IA non-small cell lung cancer status post left upper lobe lingular segmentectomy in May 2017. The patient is currently on observation and he is feeling fine with no concerning complaints. He had repeat CT scan of the chest performed recently.  I personally and independently reviewed the scans and discussed the results with the patient today. His scan showed no concerning findings for disease recurrence or metastasis. I recommended for him to continue on observation with repeat CT scan of the chest in 1 year. He was advised to call immediately if he has any concerning symptoms in the interval. The patient voices understanding of current disease status and treatment options and is in agreement with the current care plan. All questions were answered. The patient knows to call the clinic with any problems, questions or concerns. We can certainly see the patient much sooner if necessary.  Disclaimer: This note was dictated with voice recognition software. Similar sounding words can inadvertently be transcribed and may not be corrected upon review.

## 2020-09-03 DIAGNOSIS — E1169 Type 2 diabetes mellitus with other specified complication: Secondary | ICD-10-CM | POA: Diagnosis not present

## 2020-09-03 DIAGNOSIS — E785 Hyperlipidemia, unspecified: Secondary | ICD-10-CM | POA: Diagnosis not present

## 2020-09-03 DIAGNOSIS — Z0000 Encounter for general adult medical examination without abnormal findings: Secondary | ICD-10-CM | POA: Diagnosis not present

## 2020-09-03 DIAGNOSIS — Z125 Encounter for screening for malignant neoplasm of prostate: Secondary | ICD-10-CM | POA: Diagnosis not present

## 2020-09-04 LAB — LIPID PANEL
Chol/HDL Ratio: 3.5 ratio (ref 0.0–5.0)
Cholesterol, Total: 141 mg/dL (ref 100–199)
HDL: 40 mg/dL (ref >39)
LDL Chol Calc (NIH): 82 mg/dL (ref 0–99)
Triglycerides: 105 mg/dL (ref 0–149)
VLDL Cholesterol Cal: 19 mg/dL (ref 5–40)

## 2020-09-04 LAB — HEMOGLOBIN A1C
Est. average glucose Bld gHb Est-mCnc: 146 mg/dL
Hgb A1c MFr Bld: 6.7 % — ABNORMAL HIGH (ref 4.8–5.6)

## 2020-09-04 LAB — PSA: Prostate Specific Ag, Serum: 1.4 ng/mL (ref 0.0–4.0)

## 2020-10-01 ENCOUNTER — Other Ambulatory Visit: Payer: Self-pay

## 2020-10-01 ENCOUNTER — Encounter: Payer: Self-pay | Admitting: Family Medicine

## 2020-10-01 ENCOUNTER — Other Ambulatory Visit: Payer: Self-pay | Admitting: *Deleted

## 2020-10-01 ENCOUNTER — Ambulatory Visit (INDEPENDENT_AMBULATORY_CARE_PROVIDER_SITE_OTHER): Payer: Medicare Other | Admitting: Family Medicine

## 2020-10-01 VITALS — BP 138/86 | HR 96 | Temp 97.6°F | Wt 205.6 lb

## 2020-10-01 DIAGNOSIS — I10 Essential (primary) hypertension: Secondary | ICD-10-CM | POA: Diagnosis not present

## 2020-10-01 DIAGNOSIS — J019 Acute sinusitis, unspecified: Secondary | ICD-10-CM | POA: Diagnosis not present

## 2020-10-01 DIAGNOSIS — E1169 Type 2 diabetes mellitus with other specified complication: Secondary | ICD-10-CM

## 2020-10-01 DIAGNOSIS — J449 Chronic obstructive pulmonary disease, unspecified: Secondary | ICD-10-CM | POA: Diagnosis not present

## 2020-10-01 DIAGNOSIS — Z23 Encounter for immunization: Secondary | ICD-10-CM | POA: Diagnosis not present

## 2020-10-01 DIAGNOSIS — I700 Atherosclerosis of aorta: Secondary | ICD-10-CM | POA: Diagnosis not present

## 2020-10-01 DIAGNOSIS — E785 Hyperlipidemia, unspecified: Secondary | ICD-10-CM

## 2020-10-01 MED ORDER — CEFPROZIL 500 MG PO TABS: 500.0000 mg | ORAL_TABLET | Freq: Two times a day (BID) | ORAL | 0 refills | Status: DC

## 2020-10-01 NOTE — Progress Notes (Signed)
Subjective:    Patient ID: Miguel Morales, male    DOB: [birth year], 74 y.o.   MRN: [id number] Very nice patient Here today for follow-up regarding chronic health issues He is also having significant head congestion drainage over the past week and a half.  Denies wheezing denies severe shortness of breath no high fever.  States he has not been around anyone with Covid We did discuss doing a Covid test but he has declined this.  Patient is aware that we are unable to 100% tell him if this is related to Covid.  He has had Covid in the past.  We did discuss doing a booster on his vaccine but he declines doing this currently because of several articles that he had read as well as his firm belief with natural immunity.  It is certainly true that he has some level of a natural immunity but at this point time we cannot state with 782% certainty that this necessarily protects him against future infections, I did recommend Moderna booster Hypertension This is a chronic problem. Pertinent negatives include no chest pain, headaches or shortness of breath. Treatments tried: Amlodipine.  Hyperlipidemia This is a chronic problem. Pertinent negatives include no chest pain or shortness of breath. Treatments tried: Atorvastatin.  COPD There is no cough or shortness of breath. Pertinent negatives include no chest pain, headaches or rhinorrhea. His past medical history is significant for COPD.   Need for vaccination - Plan: Flu Vaccine QUAD High Dose(Fluad)  Acute rhinosinusitis  Aortic atherosclerosis (HCC), Chronic  HTN (hypertension), benign  Moderate COPD (chronic obstructive pulmonary disease) ([place])  Hyperlipidemia associated with type 2 diabetes mellitus ([place])  Patient gets his medications through the [place]    Review of Systems  Constitutional: Negative for diaphoresis and fatigue.  HENT: Negative for congestion and rhinorrhea.   Respiratory: Negative for cough and shortness of breath.     Cardiovascular: Negative for chest pain and leg swelling.  Gastrointestinal: Negative for abdominal pain and diarrhea.  Skin: Negative for color change and rash.  Neurological: Negative for dizziness and headaches.  Psychiatric/Behavioral: Negative for behavioral problems and confusion.       Objective:   Physical Exam Vitals reviewed.  Constitutional:      General: He is not in acute distress. HENT:     Head: Normocephalic and atraumatic.  Eyes:     General:        Right eye: No discharge.        Left eye: No discharge.  Neck:     Trachea: No tracheal deviation.  Cardiovascular:     Rate and Rhythm: Normal rate and regular rhythm.     Heart sounds: Normal heart sounds. No murmur heard.   Pulmonary:     Effort: Pulmonary effort is normal. No respiratory distress.     Breath sounds: Normal breath sounds.  Lymphadenopathy:     Cervical: No cervical adenopathy.  Skin:    General: Skin is warm and dry.  Neurological:     Mental Status: He is alert.     Coordination: Coordination normal.  Psychiatric:        Behavior: Behavior normal.   No sign of pneumonia on exam  Results for orders placed or performed in visit on 06/03/20  CBC with Differential (Cancer Center Only)  Result Value Ref Range   WBC Count 7.4 4.0 - 10.5 K/uL   RBC 4.59 4.22 - 5.81 MIL/uL   Hemoglobin 14.3 13.0 - 17.0 g/dL  HCT 42.7 39 - 52 %   MCV 93.0 80.0 - 100.0 fL   MCH 31.2 26.0 - 34.0 pg   MCHC 33.5 30.0 - 36.0 g/dL   RDW 13.1 11.5 - 15.5 %   Platelet Count 261 150 - 400 K/uL   nRBC 0.0 0.0 - 0.2 %   Neutrophils Relative % 65 %   Neutro Abs 4.8 1.7 - 7.7 K/uL   Lymphocytes Relative 21 %   Lymphs Abs 1.5 0.7 - 4.0 K/uL   Monocytes Relative 9 %   Monocytes Absolute 0.7 0.1 - 1.0 K/uL   Eosinophils Relative 3 %   Eosinophils Absolute 0.3 0.0 - 0.5 K/uL   Basophils Relative 1 %   Basophils Absolute 0.0 0.0 - 0.1 K/uL   Immature Granulocytes 1 %   Abs Immature Granulocytes 0.04 0.00 - 0.07  K/uL  CMP (Cancer Center only)  Result Value Ref Range   Sodium 140 135 - 145 mmol/L   Potassium 4.0 3.5 - 5.1 mmol/L   Chloride 108 98 - 111 mmol/L   CO2 21 (L) 22 - 32 mmol/L   Glucose, Bld 119 (H) 70 - 99 mg/dL   BUN 14 8 - 23 mg/dL   Creatinine 1.08 0.61 - 1.24 mg/dL   Calcium 9.9 8.9 - 10.3 mg/dL   Total Protein 7.2 6.5 - 8.1 g/dL   Albumin 3.9 3.5 - 5.0 g/dL   AST 21 15 - 41 U/L   ALT 21 0 - 44 U/L   Alkaline Phosphatase 95 38 - 126 U/L   Total Bilirubin 0.8 0.3 - 1.2 mg/dL   GFR, Estimated >60 >60 mL/min   GFR, Est AFR Am >60 >60 mL/min   Anion gap 11 5 - 15         Assessment & Plan:  1. Need for vaccination Flu shot today - Flu Vaccine QUAD High Dose(Fluad)  2. Acute rhinosinusitis Antibiotic prescribed please see discussion above  3. Aortic atherosclerosis (HCC) Has aortic atherosclerosis on previous scan importance of taking statin on Lipitor currently watch diet  4. HTN (hypertension), benign Continue blood pressure medicine.  Watch diet minimize salt  5. Moderate COPD (chronic obstructive pulmonary disease) (HCC) Continue inhalers as directed  6. Hyperlipidemia associated with type 2 diabetes mellitus ([place]) Continue statin Currently diabetes under decent control with diet and exercise We will recheck this again in approximately 4 to 5 months and at that point time do lab work

## 2020-10-01 NOTE — Patient Instructions (Signed)

## 2020-10-02 ENCOUNTER — Other Ambulatory Visit: Payer: Self-pay | Admitting: *Deleted

## 2020-10-02 DIAGNOSIS — E1169 Type 2 diabetes mellitus with other specified complication: Secondary | ICD-10-CM | POA: Diagnosis not present

## 2020-10-02 DIAGNOSIS — J449 Chronic obstructive pulmonary disease, unspecified: Secondary | ICD-10-CM | POA: Diagnosis not present

## 2020-10-02 DIAGNOSIS — J019 Acute sinusitis, unspecified: Secondary | ICD-10-CM | POA: Diagnosis not present

## 2020-10-02 DIAGNOSIS — I700 Atherosclerosis of aorta: Secondary | ICD-10-CM | POA: Diagnosis not present

## 2020-10-02 DIAGNOSIS — I10 Essential (primary) hypertension: Secondary | ICD-10-CM | POA: Diagnosis not present

## 2020-10-02 DIAGNOSIS — Z23 Encounter for immunization: Secondary | ICD-10-CM | POA: Diagnosis not present

## 2020-10-02 DIAGNOSIS — E785 Hyperlipidemia, unspecified: Secondary | ICD-10-CM | POA: Diagnosis not present

## 2020-11-21 ENCOUNTER — Telehealth: Payer: Self-pay

## 2020-11-21 DIAGNOSIS — U071 COVID-19: Secondary | ICD-10-CM

## 2020-11-21 NOTE — Telephone Encounter (Signed)
Pt is wanting to get blood work done antibodies test and wants Dr Nicki Reaper nurse to call him  Pt call back [phone]

## 2020-11-21 NOTE — Telephone Encounter (Signed)
Please advise. Thank you

## 2020-11-21 NOTE — Telephone Encounter (Signed)
It would be fine to order the antibodies.

## 2020-11-21 NOTE — Telephone Encounter (Signed)
Lab order placed and pt is aware

## 2020-12-10 DIAGNOSIS — U071 COVID-19: Secondary | ICD-10-CM | POA: Diagnosis not present

## 2020-12-11 LAB — SARS-COV-2 ANTIBODIES: SARS-CoV-2 Antibodies: POSITIVE

## 2020-12-26 LAB — SPECIMEN STATUS REPORT

## 2020-12-27 LAB — SARS-COV-2 SEMI-QUANTITATIVE TOTAL ANTIBODY, SPIKE
SARS-CoV-2 Semi-Quant Total Ab: 1794 U/mL (ref <0.8)
SARS-CoV-2 Spike Ab Interp: POSITIVE

## 2020-12-27 LAB — SPECIMEN STATUS REPORT

## 2021-02-24 ENCOUNTER — Telehealth: Payer: Self-pay

## 2021-02-24 DIAGNOSIS — I10 Essential (primary) hypertension: Secondary | ICD-10-CM

## 2021-02-24 DIAGNOSIS — E785 Hyperlipidemia, unspecified: Secondary | ICD-10-CM

## 2021-02-24 DIAGNOSIS — E1169 Type 2 diabetes mellitus with other specified complication: Secondary | ICD-10-CM

## 2021-02-24 NOTE — Telephone Encounter (Signed)
Lab orders placed and pt is aware 

## 2021-02-24 NOTE — Telephone Encounter (Signed)
A1c, CMP, lipid-hyperlipidemia diabetes

## 2021-02-24 NOTE — Telephone Encounter (Signed)
Last labs completed 09/03/20 PSA, A1C, Lipid. Please advise. Thank you

## 2021-02-24 NOTE — Telephone Encounter (Signed)
Pt called wanted to know if he needs blood work before he is seen on the 21st of April   Pt call back [phone]

## 2021-02-25 DIAGNOSIS — E785 Hyperlipidemia, unspecified: Secondary | ICD-10-CM | POA: Diagnosis not present

## 2021-02-25 DIAGNOSIS — E1169 Type 2 diabetes mellitus with other specified complication: Secondary | ICD-10-CM | POA: Diagnosis not present

## 2021-02-25 DIAGNOSIS — I10 Essential (primary) hypertension: Secondary | ICD-10-CM | POA: Diagnosis not present

## 2021-02-26 LAB — COMPREHENSIVE METABOLIC PANEL
ALT: 18 IU/L (ref 0–44)
AST: 17 IU/L (ref 0–40)
Albumin/Globulin Ratio: 1.7 (ref 1.2–2.2)
Albumin: 4.3 g/dL (ref 3.7–4.7)
Alkaline Phosphatase: 106 IU/L (ref 44–121)
BUN/Creatinine Ratio: 14 (ref 10–24)
BUN: 14 mg/dL (ref 8–27)
Bilirubin Total: 0.8 mg/dL (ref 0.0–1.2)
CO2: 20 mmol/L (ref 20–29)
Calcium: 9.4 mg/dL (ref 8.6–10.2)
Chloride: 104 mmol/L (ref 96–106)
Creatinine, Ser: 0.99 mg/dL (ref 0.76–1.27)
Globulin, Total: 2.5 g/dL (ref 1.5–4.5)
Glucose: 137 mg/dL — ABNORMAL HIGH (ref 65–99)
Potassium: 4 mmol/L (ref 3.5–5.2)
Sodium: 141 mmol/L (ref 134–144)
Total Protein: 6.8 g/dL (ref 6.0–8.5)
eGFR: 79 mL/min/{1.73_m2} (ref >59)

## 2021-02-26 LAB — LIPID PANEL
Chol/HDL Ratio: 4.2 ratio (ref 0.0–5.0)
Cholesterol, Total: 142 mg/dL (ref 100–199)
HDL: 34 mg/dL — ABNORMAL LOW (ref >39)
LDL Chol Calc (NIH): 83 mg/dL (ref 0–99)
Triglycerides: 139 mg/dL (ref 0–149)
VLDL Cholesterol Cal: 25 mg/dL (ref 5–40)

## 2021-02-26 LAB — HEMOGLOBIN A1C
Est. average glucose Bld gHb Est-mCnc: 148 mg/dL
Hgb A1c MFr Bld: 6.8 % — ABNORMAL HIGH (ref 4.8–5.6)

## 2021-02-27 ENCOUNTER — Other Ambulatory Visit: Payer: Self-pay

## 2021-02-27 ENCOUNTER — Encounter: Payer: Self-pay | Admitting: Family Medicine

## 2021-02-27 ENCOUNTER — Ambulatory Visit (INDEPENDENT_AMBULATORY_CARE_PROVIDER_SITE_OTHER): Payer: Medicare Other | Admitting: Family Medicine

## 2021-02-27 VITALS — BP 134/82 | HR 70 | Temp 98.2°F | Ht 64.5 in | Wt 203.0 lb

## 2021-02-27 DIAGNOSIS — I700 Atherosclerosis of aorta: Secondary | ICD-10-CM

## 2021-02-27 DIAGNOSIS — I10 Essential (primary) hypertension: Secondary | ICD-10-CM | POA: Diagnosis not present

## 2021-02-27 DIAGNOSIS — E1169 Type 2 diabetes mellitus with other specified complication: Secondary | ICD-10-CM

## 2021-02-27 DIAGNOSIS — E785 Hyperlipidemia, unspecified: Secondary | ICD-10-CM | POA: Diagnosis not present

## 2021-02-27 DIAGNOSIS — J449 Chronic obstructive pulmonary disease, unspecified: Secondary | ICD-10-CM | POA: Diagnosis not present

## 2021-02-27 MED ORDER — EZETIMIBE 10 MG PO TABS
10.0000 mg | ORAL_TABLET | Freq: Every day | ORAL | 3 refills | Status: DC
Start: 2021-02-27 — End: 2021-06-04

## 2021-02-27 MED ORDER — EZETIMIBE 10 MG PO TABS: 10.0000 mg | ORAL_TABLET | Freq: Every day | ORAL | 3 refills | Status: DC

## 2021-02-27 MED ORDER — HYDROCODONE-ACETAMINOPHEN 10-325 MG PO TABS
1.0000 | ORAL_TABLET | ORAL | 0 refills | Status: AC | PRN
Start: 2021-02-27 — End: 2021-03-04

## 2021-02-27 NOTE — Progress Notes (Signed)
 Subjective:    Patient ID: Miguel Morales, male    DOB: 06/28/1946, 75 y.o.   MRN: [id number]  HPI Diabetes and hypertension follow up  C/O Back pain Hx of surgery in 2020, pain returned 6 weeks ago , app with neurosurgeon on 5/18/ per patient- requesting something for pain  Patient relates that his back is bothering him.  Pain down the right leg.  Feels like he would benefit from hydrocodone.  States that previous 5 mg was not quite enough but did not cause any drowsiness. Patient with history of aortic atherosclerosis and is on cholesterol medicine keeps it under control watch his diet Patient has COPD has been seen by pulmonary does no longer smoke uses inhalers some, takes Symbicort on a regular basis [facility] but follows here for his regular checkups Blood pressures been under good control denies any chest tightness pressure pain States his sugars been doing good he is trying to watch his diet is not on any medicines tries to control it by diet alone denies excessive thirst or other issues Does take his cholesterol medicine on a regular basis. Patient is followed by pulmonary they do annual CT scan had previous lung issues but doing fairly well currently not smoking  Review of Systems  Constitutional: Negative for diaphoresis and fatigue.  HENT: Negative for congestion and rhinorrhea.   Respiratory: Negative for cough and shortness of breath.   Cardiovascular: Negative for chest pain and leg swelling.  Gastrointestinal: Negative for abdominal pain and diarrhea.  Skin: Negative for color change and rash.  Neurological: Negative for dizziness and headaches.  Psychiatric/Behavioral: Negative for behavioral problems and confusion.       Objective:   Physical Exam Vitals reviewed.  Constitutional:      General: He is not in acute distress. HENT:     Head: Normocephalic and atraumatic.  Eyes:     General:        Right eye: No discharge.         Left eye: No discharge.  Neck:     Trachea: No tracheal deviation.  Cardiovascular:     Rate and Rhythm: Normal rate and regular rhythm.     Heart sounds: Normal heart sounds. No murmur heard.   Pulmonary:     Effort: Pulmonary effort is normal. No respiratory distress.     Breath sounds: Normal breath sounds.  Lymphadenopathy:     Cervical: No cervical adenopathy.  Skin:    General: Skin is warm and dry.  Neurological:     Mental Status: He is alert.     Coordination: Coordination normal.  Psychiatric:        Behavior: Behavior normal.    Results for orders placed or performed in visit on 02/24/21  Hemoglobin A1c  Result Value Ref Range   Hgb A1c MFr Bld 6.8 (H) 4.8 - 5.6 %   Est. average glucose Bld gHb Est-mCnc 148 mg/dL  Comprehensive Metabolic Panel (CMET)  Result Value Ref Range   Glucose 137 (H) 65 - 99 mg/dL   BUN 14 8 - 27 mg/dL   Creatinine, Ser 0.99 0.76 - 1.27 mg/dL   eGFR 79 >59 mL/min/1.73   BUN/Creatinine Ratio 14 10 - 24   Sodium 141 134 - 144 mmol/L   Potassium 4.0 3.5 - 5.2 mmol/L   Chloride 104 96 - 106 mmol/L   CO2 20 20 - 29 mmol/L   Calcium 9.4 8.6 - 10.2   mg/dL   Total Protein 6.8 6.0 - 8.5 g/dL   Albumin 4.3 3.7 - 4.7 g/dL   Globulin, Total 2.5 1.5 - 4.5 g/dL   Albumin/Globulin Ratio 1.7 1.2 - 2.2   Bilirubin Total 0.8 0.0 - 1.2 mg/dL   Alkaline Phosphatase 106 44 - 121 IU/L   AST 17 0 - 40 IU/L   ALT 18 0 - 44 IU/L  Lipid Profile  Result Value Ref Range   Cholesterol, Total 142 100 - 199 mg/dL   Triglycerides 139 0 - 149 mg/dL   HDL 34 (L) >39 mg/dL   VLDL Cholesterol Cal 25 5 - 40 mg/dL   LDL Chol Calc (NIH) 83 0 - 99 mg/dL   Chol/HDL Ratio 4.2 0.0 - 5.0 ratio   Patient will keep [place] updated regarding his eye exams       Assessment & Plan:  1. HTN (hypertension), benign Blood pressure under good control continue current measures watch diet try to stay active  2. Hyperlipidemia associated with type 2 diabetes mellitus  (HCC) Cholesterol LDL not quite at goal so therefore add Zetia this should help continue current measures  3. Moderate COPD (chronic obstructive pulmonary disease) (HCC) Continue inhalers stay away from smoking no flareups currently  4. Aortic atherosclerosis (HCC) On CT scan has shown previous aortic atherosclerosis but under good control by taking cholesterol medicine Has diabetes but this is being controlled by diet alone hold off on medications recheck A1c again later this fall

## 2021-03-14 DIAGNOSIS — M4316 Spondylolisthesis, lumbar region: Secondary | ICD-10-CM | POA: Diagnosis not present

## 2021-03-14 DIAGNOSIS — M48062 Spinal stenosis, lumbar region with neurogenic claudication: Secondary | ICD-10-CM | POA: Diagnosis not present

## 2021-03-24 DIAGNOSIS — M5416 Radiculopathy, lumbar region: Secondary | ICD-10-CM | POA: Diagnosis not present

## 2021-03-24 DIAGNOSIS — M5116 Intervertebral disc disorders with radiculopathy, lumbar region: Secondary | ICD-10-CM | POA: Diagnosis not present

## 2021-03-27 DIAGNOSIS — M48061 Spinal stenosis, lumbar region without neurogenic claudication: Secondary | ICD-10-CM | POA: Diagnosis not present

## 2021-03-27 DIAGNOSIS — M5126 Other intervertebral disc displacement, lumbar region: Secondary | ICD-10-CM | POA: Diagnosis not present

## 2021-03-27 DIAGNOSIS — M48062 Spinal stenosis, lumbar region with neurogenic claudication: Secondary | ICD-10-CM | POA: Diagnosis not present

## 2021-04-09 DIAGNOSIS — M48062 Spinal stenosis, lumbar region with neurogenic claudication: Secondary | ICD-10-CM | POA: Diagnosis not present

## 2021-04-29 ENCOUNTER — Ambulatory Visit (INDEPENDENT_AMBULATORY_CARE_PROVIDER_SITE_OTHER): Payer: Medicare Other | Admitting: Internal Medicine

## 2021-04-29 ENCOUNTER — Other Ambulatory Visit: Payer: Self-pay

## 2021-04-29 ENCOUNTER — Telehealth: Payer: Self-pay | Admitting: *Deleted

## 2021-04-29 ENCOUNTER — Encounter: Payer: Self-pay | Admitting: Internal Medicine

## 2021-04-29 VITALS — BP 130/81 | HR 101 | Temp 97.7°F | Ht 65.0 in | Wt 198.2 lb

## 2021-04-29 DIAGNOSIS — K921 Melena: Secondary | ICD-10-CM

## 2021-04-29 MED ORDER — PEG 3350-KCL-NA BICARB-NACL 420 G PO SOLR
ORAL | 0 refills | Status: DC
Start: 2021-04-29 — End: 2022-04-27

## 2021-04-29 NOTE — Addendum Note (Signed)
Addended by: Cheron Every on: 04/29/2021 03:52 PM   Modules accepted: Orders

## 2021-04-29 NOTE — Patient Instructions (Signed)
We will schedule a diagnostic colonoscopy (hematochezia history of colonic polyps).  ASA 3/propofol.  Take omeprazole 20 mg daily as long as you take ibuprofen for muscular skeletal symptoms.  Further recommendations to follow after colonoscopy is been performed.

## 2021-04-29 NOTE — Progress Notes (Signed)
Primary Care Physician:  [name], MD Primary Gastroenterologist:  Dr. Gala Romney  Pre-Procedure History & Physical: HPI:  Miguel Morales is a 75 y.o. male here for further evaluation of an episode of rectal bleeding.  Several weeks ago,  patient noted some fresh blood per rectum.  He noted it time of wiping over a couple of weeks.  It subsequently subsided.  No associated abdominal pain.  Denies constipation diarrhea.  Has experienced symptoms previous to this episode or since this time.  History of small colonic adenomas removed over time  -  last colonoscopy 2015-diverticulosis and small rectal adenoma.  Since his last colonoscopy, he has been have progressing COPD and under went VATS for removal of a limited stage non-small cell lung cancer.  Also,had surgery for spinal stenosis. He has occasional reflux symptoms for which he takes omeprazole.  No dysphagia.  He does take ibuprofen 400 mg daily only sporadic use of omeprazole.  Past Medical History:  Diagnosis Date   Aortic atherosclerosis ([place]) 01/02/2020   Seen on CT exam February 2021   Arthritis    Asthma    Cancer ([place])    skin,lung   COPD (chronic obstructive pulmonary disease) (HCC)    GERD (gastroesophageal reflux disease)    History of bronchitis    Hypertension    Mixed hyperlipidemia    Nocturia    Shortness of breath dyspnea    with exertion   Sleep apnea    will sometimes wear a CPAP   Spinal stenosis     Past Surgical History:  Procedure Laterality Date   CIRCUMCISION     COLONOSCOPY  03/04/2004   [id number] diverticula.  The remainder of the colonic mucosa appeared normal/normal rectum   COLONOSCOPY  05/22/2009   [id number] mucosa appeared normal/Left-sided diverticula/Normal rectum   COLONOSCOPY N/A 08/02/2014   Procedure: COLONOSCOPY;  Surgeon: Daneil Dolin, MD;  [facility];  Service: Endoscopy;  Laterality: N/A;  2:00 PM   KNEE ARTHROSCOPY Right 2004   LYMPH NODE DISSECTION N/A  04/08/2016   Procedure: LYMPH NODE DISSECTION;  Surgeon: Melrose Nakayama, MD;  Location: [facility];  Service: Thoracic;  Laterality: N/A;   SEPTOPLASTY     VIDEO ASSISTED THORACOSCOPY (VATS)/WEDGE RESECTION Left 04/08/2016   Procedure: VIDEO ASSISTED THORACOSCOPY (VATS)/WEDGE RESECTION;  Surgeon: Melrose Nakayama, MD;  Location: [facility];  Service: Thoracic;  Laterality: Left;   VIDEO BRONCHOSCOPY WITH ENDOBRONCHIAL NAVIGATION N/A 02/26/2016   Procedure: VIDEO BRONCHOSCOPY WITH ENDOBRONCHIAL NAVIGATION LEFT UPPER LOBE LUNG NODULE;  Surgeon: [name], MD;  Location: [facility];  Service: Thoracic;  Laterality: N/A;    Prior to Admission medications   Medication Sig Start Date End Date Taking? Authorizing Provider  acetaminophen (TYLENOL) 650 MG CR tablet Take 1,300 mg by mouth every 8 (eight) hours as needed for pain.   Yes [provider]  albuterol (PROVENTIL HFA;VENTOLIN HFA) 108 (90 Base) MCG/ACT inhaler Inhale 2 puffs into the lungs every 6 (six) hours as needed for wheezing. 12/23/15  Yes Luking, Elayne Snare, MD  amLODipine (NORVASC) 10 MG tablet TAKE 1 TABLET DAILY Patient taking differently: Take 10 mg by mouth daily. 07/01/16  Yes [name], MD  Ascorbic Acid (VITAMIN C) 1000 MG tablet Take 1,000 mg by mouth daily.   Yes [provider]  aspirin 81 MG tablet Take 81 mg by mouth daily.   Yes [provider]  atorvastatin (LIPITOR) 80 MG tablet Take 1 tablet (  80 mg total) by mouth daily. 05/01/20  Yes Luking, Elayne Snare, MD  azelastine (ASTELIN) 0.1 % nasal spray Place 1 spray into both nostrils 2 (two) times daily. Use in each nostril as directed Patient taking differently: Place 1 spray into both nostrils 2 (two) times daily as needed for rhinitis or allergies. Use in each nostril as directed 03/18/18  Yes Luking, Elayne Snare, MD  budesonide-formoterol (SYMBICORT) 160-4.5 MCG/ACT inhaler Inhale 2 puffs into the lungs 2 (two) times daily.   Yes [provider]   Carboxymethylcellulose Sod PF 1 % GEL Place 1 drop into both eyes as needed (dry eyes).   Yes [provider]  cholecalciferol (VITAMIN D3) 25 MCG (1000 UNIT) tablet Take 2,000 Units by mouth daily. 2,000 daily   Yes [provider]  ezetimibe (ZETIA) 10 MG tablet Take 1 tablet (10 mg total) by mouth daily. 02/27/21  Yes Luking, Elayne Snare, MD  fluticasone (FLONASE) 50 MCG/ACT nasal spray USE 1 SPRAY IN EACH NOSTRIL DAILY AS NEEDED Patient taking differently: Place 1 spray into both nostrils daily as needed for allergies. 04/27/16  Yes Luking, Elayne Snare, MD  gabapentin (NEURONTIN) 100 MG capsule Take 200-300 mg by mouth at bedtime.  11/18/18  Yes [provider]  hydrOXYzine (ATARAX/VISTARIL) 10 MG tablet Take 10 mg by mouth at bedtime.    Yes [provider]  ibuprofen (ADVIL,MOTRIN) 200 MG tablet Take 800 mg by mouth 2 (two) times daily as needed.    Yes [provider]  lidocaine (XYLOCAINE) 5 % ointment Apply 1 application topically as needed for mild pain (For Back and hip).    Yes [provider]  omeprazole (PRILOSEC) 20 MG capsule TAKE 1 CAPSULE DAILY AS NEEDED Patient taking differently: Take 20 mg by mouth daily as needed (heartburn). TAKE 1 CAPSULE DAILY AS NEEDED 09/21/16  Yes Luking, Scott A, MD  tiotropium (SPIRIVA HANDIHALER) 18 MCG inhalation capsule Place 1 capsule (18 mcg total) into inhaler and inhale daily. 01/31/19  Yes [name], MD  zinc gluconate 50 MG tablet Take 50 mg by mouth daily.   Yes [provider]    Allergies as of 04/29/2021 - Review Complete 04/29/2021  Allergen Reaction Noted   Adhesive [tape] Other (See Comments) 04/09/2016   Sulfa antibiotics Hives 12/28/2011    Family History  Problem Relation Age of Onset   Cancer Mother     Social History   Socioeconomic History   Marital status: Married    Spouse name: Not on file   Number of children: Not on file   Years of education: Not on file    Highest education level: Not on file  Occupational History   Not on file  Tobacco Use   Smoking status: Former    Packs/day: 1.00    Years: 30.00    Pack years: 30.00    Types: Cigarettes    Start date: 11/09/1958    Quit date: 11/10/2003    Years since quitting: 17.4   Smokeless tobacco: Never  Substance and Sexual Activity   Alcohol use: Yes    Alcohol/week: 0.0 standard drinks    Comment: Occasionally   Drug use: No   Sexual activity: Not on file  Other Topics Concern   Not on file  Social History Narrative   Not on file   Social Determinants of Health   Financial Resource Strain: Not on file  Food Insecurity: Not on file  Transportation Needs: Not on file  Physical  Activity: Not on file  Stress: Not on file  Social Connections: Not on file  Intimate Partner Violence: Not on file    Review of Systems: See HPI, otherwise negative ROS  Physical Exam: BP 130/81   Pulse (!) 101   Temp 97.7 F (36.5 C)   Ht 5\' 5"  (1.651 m)   Wt 198 lb 3.2 oz (89.9 kg)   BMI 32.98 kg/m  General:   Alert,  pleasant and cooperative in NAD SNeck:  Supple; no masses or thyromegaly. No significant cervical adenopathy. Lungs:  Clear throughout to auscultation.   Distant breath sounds.  No wheezes, crackles, or rhonchi. No acute distress. Heart:  Regular rate and rhythm; no murmurs, clicks, rubs,  or gallops. Abdomen: mildly obese.  Soft and nontender. Pulses:  Normal pulses noted. Extremities:  Without clubbing or edema.  Impression/Plan: Pleasant 75 year old gentleman with self-limiting painless hematochezia in the setting of history of colonic polyps removed previously. We talked about the pros and cons of pursuing a diagnostic colonoscopy. Pulmonary comorbidities could impact on performing another colonoscopy.  However, I do not feel they are prohibitive. He needs to have a diagnostic colonoscopy.  He does take ibuprofen daily and is not on cyto-protection; he needs to take PPI  concomitantly.  Recommendations:  I have offered  the patient a diagnostic colonoscopy. The risks, benefits, limitations, alternatives and imponderables have been reviewed with the patient. Questions have been answered. All parties are agreeable.   ASA 3/propofol.  Have also recommended he take omeprazole 20 mg daily concomitantly with daily ibuprofen usage.  Further recommendations to follow.      Notice: This dictation was prepared with Dragon dictation along with smaller phrase technology. Any transcriptional errors that result from this process are unintentional and may not be corrected upon review.

## 2021-04-29 NOTE — Telephone Encounter (Signed)
Patient returned call. He has been scheduled for 8/4 at 11:00am. Aware will mail prep instructions with pre-op appt. Will send Rx to pharmacy.

## 2021-04-29 NOTE — Telephone Encounter (Signed)
LMOVM for pt to call back to schedule TCS with propofol, ASA 3, Dr. Gala Romney

## 2021-04-30 ENCOUNTER — Encounter: Payer: Self-pay | Admitting: *Deleted

## 2021-06-02 ENCOUNTER — Inpatient Hospital Stay: Payer: Medicare Other | Attending: Internal Medicine

## 2021-06-02 ENCOUNTER — Other Ambulatory Visit: Payer: Self-pay

## 2021-06-02 ENCOUNTER — Ambulatory Visit (HOSPITAL_COMMUNITY)
Admission: RE | Admit: 2021-06-02 | Discharge: 2021-06-02 | Disposition: A | Discharge status: Home / Self Care | Payer: Medicare Other | Source: Ambulatory Visit | Attending: Internal Medicine | Admitting: Internal Medicine

## 2021-06-02 DIAGNOSIS — C3490 Malignant neoplasm of unspecified part of unspecified bronchus or lung: Secondary | ICD-10-CM

## 2021-06-02 DIAGNOSIS — J439 Emphysema, unspecified: Secondary | ICD-10-CM | POA: Diagnosis not present

## 2021-06-02 DIAGNOSIS — I700 Atherosclerosis of aorta: Secondary | ICD-10-CM | POA: Diagnosis not present

## 2021-06-02 DIAGNOSIS — Z85118 Personal history of other malignant neoplasm of bronchus and lung: Secondary | ICD-10-CM | POA: Diagnosis not present

## 2021-06-02 LAB — CBC WITH DIFFERENTIAL (CANCER CENTER ONLY)
Abs Immature Granulocytes: 0.04 10*3/uL (ref 0.00–0.07)
Basophils Absolute: 0.1 10*3/uL (ref 0.0–0.1)
Basophils Relative: 1 %
Eosinophils Absolute: 0.2 10*3/uL (ref 0.0–0.5)
Eosinophils Relative: 3 %
HCT: 39.6 % (ref 39.0–52.0)
Hemoglobin: 13.2 g/dL (ref 13.0–17.0)
Immature Granulocytes: 1 %
Lymphocytes Relative: 20 %
Lymphs Abs: 1.2 10*3/uL (ref 0.7–4.0)
MCH: 30.9 pg (ref 26.0–34.0)
MCHC: 33.3 g/dL (ref 30.0–36.0)
MCV: 92.7 fL (ref 80.0–100.0)
Monocytes Absolute: 0.6 10*3/uL (ref 0.1–1.0)
Monocytes Relative: 9 %
Neutro Abs: 4.2 10*3/uL (ref 1.7–7.7)
Neutrophils Relative %: 66 %
Platelet Count: 279 10*3/uL (ref 150–400)
RBC: 4.27 MIL/uL (ref 4.22–5.81)
RDW: 13.8 % (ref 11.5–15.5)
WBC Count: 6.3 10*3/uL (ref 4.0–10.5)
nRBC: 0 % (ref 0.0–0.2)

## 2021-06-02 LAB — CMP (CANCER CENTER ONLY)
ALT: 20 U/L (ref 0–44)
AST: 21 U/L (ref 15–41)
Albumin: 3.8 g/dL (ref 3.5–5.0)
Alkaline Phosphatase: 103 U/L (ref 38–126)
Anion gap: 11 (ref 5–15)
BUN: 17 mg/dL (ref 8–23)
CO2: 24 mmol/L (ref 22–32)
Calcium: 9.7 mg/dL (ref 8.9–10.3)
Chloride: 107 mmol/L (ref 98–111)
Creatinine: 1.04 mg/dL (ref 0.61–1.24)
GFR, Estimated: >60 mL/min (ref >60)
Glucose, Bld: 204 mg/dL — ABNORMAL HIGH (ref 70–99)
Potassium: 3.8 mmol/L (ref 3.5–5.1)
Sodium: 142 mmol/L (ref 135–145)
Total Bilirubin: 0.7 mg/dL (ref 0.3–1.2)
Total Protein: 6.9 g/dL (ref 6.5–8.1)

## 2021-06-02 MED ORDER — IOHEXOL 350 MG/ML SOLN
100.0000 mL | Freq: Once | INTRAVENOUS | Status: AC | PRN
  Administered 2021-06-02: 60 mL via INTRAVENOUS

## 2021-06-04 ENCOUNTER — Inpatient Hospital Stay (HOSPITAL_BASED_OUTPATIENT_CLINIC_OR_DEPARTMENT_OTHER): Payer: Medicare Other | Admitting: Internal Medicine

## 2021-06-04 ENCOUNTER — Other Ambulatory Visit: Payer: Self-pay

## 2021-06-04 VITALS — BP 127/68 | HR 92 | Temp 98.2°F | Resp 20 | Ht 65.0 in | Wt 202.8 lb

## 2021-06-04 DIAGNOSIS — Z85118 Personal history of other malignant neoplasm of bronchus and lung: Secondary | ICD-10-CM | POA: Diagnosis not present

## 2021-06-04 DIAGNOSIS — C3490 Malignant neoplasm of unspecified part of unspecified bronchus or lung: Secondary | ICD-10-CM

## 2021-06-04 NOTE — Progress Notes (Signed)
[facility] [phone]   [phone]  OFFICE PROGRESS NOTE  [name], MD [place] [address]  DIAGNOSIS: Stage IA (T1a, N0, M0) non-small cell lung cancer, adenocarcinoma with negative EGFR, ALK, ROS 1 and BRAF mutations diagnosed in May 2017. PDL 1 expression is 10%  PRIOR THERAPY: Status post left upper lobe lingular segmentectomy with lymph node dissection under the care of Dr. Roxan Hockey on 04/08/2016.  CURRENT THERAPY: Observation.  INTERVAL HISTORY: Miguel Morales 75 y.o. male returns to the clinic today for follow-up visit.  The patient is feeling fine today with no concerning complaints.  He denied having any current chest pain but has shortness of breath with exertion with mild cough and no hemoptysis.  He denied having any recent weight loss or night sweats.  He has no nausea, vomiting, diarrhea or constipation.  He has no headache or visual changes.  He has no fever or chills.  The patient had repeat CT scan of the chest performed recently and he is here for evaluation and discussion of his scan results.  MEDICAL HISTORY: Past Medical History:  Diagnosis Date   Aortic atherosclerosis ([place]) 01/02/2020   Seen on CT exam February 2021   Arthritis    Asthma    Cancer ([place])    skin,lung   COPD (chronic obstructive pulmonary disease) (HCC)    GERD (gastroesophageal reflux disease)    History of bronchitis    Hypertension    Mixed hyperlipidemia    Nocturia    Shortness of breath dyspnea    with exertion   Sleep apnea    will sometimes wear a CPAP   Spinal stenosis     ALLERGIES:  is allergic to adhesive [tape] and sulfa antibiotics.  MEDICATIONS:  Current Outpatient Medications  Medication Sig Dispense Refill   acetaminophen (TYLENOL) 500 MG tablet Take 500-1,000 mg by mouth daily.     albuterol (PROVENTIL HFA;VENTOLIN HFA) 108 (90 Base) MCG/ACT inhaler Inhale 2 puffs into the lungs every 6  (six) hours as needed for wheezing. 3 Inhaler 2   amLODipine (NORVASC) 10 MG tablet TAKE 1 TABLET DAILY (Patient taking differently: Take 10 mg by mouth daily.) 90 tablet 1   Ascorbic Acid (VITAMIN C) 1000 MG tablet Take 1,000 mg by mouth daily.     aspirin 81 MG tablet Take 81 mg by mouth daily.     atorvastatin (LIPITOR) 80 MG tablet Take 1 tablet (80 mg total) by mouth daily. 30 tablet 5   Carboxymethylcellulose Sodium (THERATEARS) 0.25 % SOLN Place 1 drop into both eyes daily as needed (dry eyes).     Cholecalciferol (VITAMIN D) 50 MCG (2000 UT) tablet Take 2,000 Units by mouth daily.     clobetasol (TEMOVATE) 0.05 % external solution Apply 1 application topically every other day.     diclofenac Sodium (VOLTAREN) 1 % GEL Apply 1 application topically daily as needed for pain.     fluticasone (FLONASE) 50 MCG/ACT nasal spray USE 1 SPRAY IN EACH NOSTRIL DAILY AS NEEDED (Patient taking differently: Place 1 spray into both nostrils daily as needed for allergies.) 16 g 4   fluticasone-salmeterol (ADVAIR) 250-50 MCG/ACT AEPB Inhale 1 puff into the lungs in the morning and at bedtime.     gabapentin (NEURONTIN) 100 MG capsule Take 300 mg by mouth at bedtime.     hydrOXYzine (ATARAX/VISTARIL) 10 MG tablet Take 10 mg by mouth at bedtime.  ibuprofen (ADVIL,MOTRIN) 200 MG tablet Take 600 mg by mouth daily.     lidocaine (XYLOCAINE) 5 % ointment Apply 1 application topically daily as needed for mild pain (For Back and hip).     magnesium 30 MG tablet Take 30 mg by mouth daily at 6 (six) AM. Over the counter dose     omeprazole (PRILOSEC) 20 MG capsule TAKE 1 CAPSULE DAILY AS NEEDED (Patient taking differently: Take 20 mg by mouth daily.) 90 capsule 1   tiotropium (SPIRIVA HANDIHALER) 18 MCG inhalation capsule Place 1 capsule (18 mcg total) into inhaler and inhale daily. 30 capsule 0   zinc gluconate 50 MG tablet Take 50 mg by mouth daily.     azelastine (ASTELIN) 0.1 % nasal spray Place 1 spray into  both nostrils 2 (two) times daily. Use in each nostril as directed (Patient not taking: Reported on 06/04/2021) 30 mL 12   polyethylene glycol-electrolytes (NULYTELY) 420 g solution As directed (Patient not taking: Reported on 06/04/2021) 4000 mL 0   No current facility-administered medications for this visit.    SURGICAL HISTORY:  Past Surgical History:  Procedure Laterality Date   CIRCUMCISION     COLONOSCOPY  03/04/2004   [id number] diverticula.  The remainder of the colonic mucosa appeared normal/normal rectum   COLONOSCOPY  05/22/2009   [id number] mucosa appeared normal/Left-sided diverticula/Normal rectum   COLONOSCOPY N/A 08/02/2014   Procedure: COLONOSCOPY;  Surgeon: Corbin Ade, MD;  [facility];  Service: Endoscopy;  Laterality: N/A;  2:00 PM   KNEE ARTHROSCOPY Right 2004   LYMPH NODE DISSECTION N/A 04/08/2016   Procedure: LYMPH NODE DISSECTION;  Surgeon: Loreli Slot, MD;  [facility];  Service: Thoracic;  Laterality: N/A;   SEPTOPLASTY     VIDEO ASSISTED THORACOSCOPY (VATS)/WEDGE RESECTION Left 04/08/2016   Procedure: VIDEO ASSISTED THORACOSCOPY (VATS)/WEDGE RESECTION;  Surgeon: Loreli Slot, MD;  [facility];  Service: Thoracic;  Laterality: Left;   VIDEO BRONCHOSCOPY WITH ENDOBRONCHIAL NAVIGATION N/A 02/26/2016   Procedure: VIDEO BRONCHOSCOPY WITH ENDOBRONCHIAL NAVIGATION LEFT UPPER LOBE LUNG NODULE;  Surgeon: Leslye Peer, MD;  [facility];  Service: Thoracic;  Laterality: N/A;    REVIEW OF SYSTEMS:  A comprehensive review of systems was negative except for: Respiratory: positive for dyspnea on exertion   PHYSICAL EXAMINATION: General appearance: alert, cooperative, and no distress Head: Normocephalic, without obvious abnormality, atraumatic Neck: no adenopathy, no JVD, supple, symmetrical, trachea midline, and thyroid not enlarged, symmetric, no tenderness/mass/nodules Lymph nodes: Cervical, supraclavicular, and axillary nodes  normal. Resp: clear to auscultation bilaterally Back: symmetric, no curvature. ROM normal. No CVA tenderness. Cardio: regular rate and rhythm, S1, S2 normal, no murmur, click, rub or gallop GI: soft, non-tender; bowel sounds normal; no masses,  no organomegaly Extremities: extremities normal, atraumatic, no cyanosis or edema  ECOG PERFORMANCE STATUS: 1 - Symptomatic but completely ambulatory  Blood pressure 127/68, pulse 92, temperature 98.2 F (36.8 C), temperature source Oral, resp. rate 20, height 5\' 5"  (1.651 m), weight 202 lb 12.8 oz (92 kg), SpO2 99 %.  LABORATORY DATA: Lab Results  Component Value Date   WBC 6.3 06/02/2021   HGB 13.2 06/02/2021   HCT 39.6 06/02/2021   MCV 92.7 06/02/2021   PLT 279 06/02/2021      Chemistry      Component Value Date/Time   NA 142 06/02/2021 1000   NA 141 02/25/2021 1031   NA 140 05/18/2017 1056   K 3.8 06/02/2021 1000  K 4.0 05/18/2017 1056   CL 107 06/02/2021 1000   CO2 24 06/02/2021 1000   CO2 27 05/18/2017 1056   BUN 17 06/02/2021 1000   BUN 14 02/25/2021 1031   BUN 16.8 05/18/2017 1056   CREATININE 1.04 06/02/2021 1000   CREATININE 1.1 05/18/2017 1056      Component Value Date/Time   CALCIUM 9.7 06/02/2021 1000   CALCIUM 10.3 05/18/2017 1056   ALKPHOS 103 06/02/2021 1000   ALKPHOS 92 05/18/2017 1056   AST 21 06/02/2021 1000   AST 20 05/18/2017 1056   ALT 20 06/02/2021 1000   ALT 20 05/18/2017 1056   BILITOT 0.7 06/02/2021 1000   BILITOT 0.72 05/18/2017 1056       RADIOGRAPHIC STUDIES: CT Chest W Contrast  Result Date: 06/03/2021 CLINICAL DATA:  Non-small cell lung cancer restaging, status post resection EXAM: CT CHEST WITH CONTRAST TECHNIQUE: Multidetector CT imaging of the chest was performed during intravenous contrast administration. CONTRAST:  83mL OMNIPAQUE IOHEXOL 350 MG/ML SOLN COMPARISON:  06/03/2020 FINDINGS: Cardiovascular: Aortic atherosclerosis. Normal heart size. Three-vessel coronary artery  calcifications and/or stents. No pericardial effusion. Mediastinum/Nodes: Prominent subcentimeter mediastinal lymph nodes are unchanged (series 2, image 59, 28). No overtly enlarged mediastinal, hilar, or axillary lymph nodes. Thyroid gland, trachea, and esophagus demonstrate no significant findings. Lungs/Pleura: Mild-to-moderate centrilobular emphysema. Redemonstrated postoperative findings of wedge resection of the posterior lingula (series 5, image 74). No pleural effusion or pneumothorax. Upper Abdomen: No acute abnormality. Musculoskeletal: No chest wall mass or suspicious bone lesions identified. IMPRESSION: 1. Stable postoperative findings of wedge resection of the posterior lingula. No evidence of recurrent or metastatic disease in the chest. 2. Emphysema. 3. Coronary artery disease. Aortic Atherosclerosis (ICD10-I70.0) and Emphysema (ICD10-J43.9). Electronically Signed   By: Eddie Candle M.D.   On: 06/03/2021 08:31     ASSESSMENT AND PLAN: This is a very pleasant 75 years old white male with a stage IA non-small cell lung cancer status post left upper lobe lingular segmentectomy in May 2017. The patient has been on observation for the last 5 years with no concerning findings or complaints except for mild shortness of breath. He had repeat CT scan of the chest performed recently.  I personally and independently reviewed the scan and discussed the results with the patient today. His scan showed no concerning findings for disease recurrence or metastasis. I recommended for him to continue on observation with repeat CT scan of the chest in 1 year. The patient was advised to call immediately if he has any concerning symptoms in the interval. The patient voices understanding of current disease status and treatment options and is in agreement with the current care plan. All questions were answered. The patient knows to call the clinic with any problems, questions or concerns. We can certainly see the  patient much sooner if necessary.  Disclaimer: This note was dictated with voice recognition software. Similar sounding words can inadvertently be transcribed and may not be corrected upon review.

## 2021-06-06 ENCOUNTER — Telehealth: Payer: Self-pay | Admitting: Internal Medicine

## 2021-06-06 NOTE — Telephone Encounter (Signed)
Scheduled per los. Mailed printout  °

## 2021-06-06 NOTE — Patient Instructions (Signed)
Miguel Morales  06/06/2021     @PREFPERIOPPHARMACY @   Your procedure is scheduled on  06/12/2021.   Report to [facility] at  0930  A.M.   Call this number if you have problems the morning of surgery:  [phone]   Remember:  Follow the diet and prep instructions given to you by the office.    Take these medicines the morning of surgery with A SIP OF WATER                            amlodipine, prilosec.     Do not wear jewelry, make-up or nail polish.  Do not wear lotions, powders, or perfumes, or deodorant.  Do not shave 48 hours prior to surgery.  Men may shave face and neck.  Do not bring valuables to the hospital.  [facility] is not responsible for any belongings or valuables.  Contacts, dentures or bridgework may not be worn into surgery.  Leave your suitcase in the car.  After surgery it may be brought to your room.  For patients admitted to the hospital, discharge time will be determined by your treatment team.  Patients discharged the day of surgery will not be allowed to drive home and must have someone with them for 24 hours.    Special instructions:   DO NOT smoke tobacco or vape for 24 hours before your procedure.  Please read over the following fact sheets that you were given. Anesthesia Post-op Instructions and Care and Recovery After Surgery      Colonoscopy, Adult, Care After This sheet gives you information about how to care for yourself after your procedure. Your health care provider may also give you more specific instructions. If you have problems or questions, contact your health careprovider. What can I expect after the procedure? After the procedure, it is common to have: A small amount of blood in your stool for 24 hours after the procedure. Some gas. Mild cramping or bloating of your abdomen. Follow these instructions at home: Eating and drinking  Drink enough fluid to keep your urine pale yellow. Follow instructions from your  health care provider about eating or drinking restrictions. Resume your normal diet as instructed by your health care provider. Avoid heavy or fried foods that are hard to digest.  Activity Rest as told by your health care provider. Avoid sitting for a long time without moving. Get up to take short walks every 1-2 hours. This is important to improve blood flow and breathing. Ask for help if you feel weak or unsteady. Return to your normal activities as told by your health care provider. Ask your health care provider what activities are safe for you. Managing cramping and bloating  Try walking around when you have cramps or feel bloated. Apply heat to your abdomen as told by your health care provider. Use the heat source that your health care provider recommends, such as a moist heat pack or a heating pad. Place a towel between your skin and the heat source. Leave the heat on for 20-30 minutes. Remove the heat if your skin turns bright red. This is especially important if you are unable to feel pain, heat, or cold. You may have a greater risk of getting burned.  General instructions If you were given a sedative during the procedure, it can affect you for several hours. Do not drive or operate machinery until your  health care provider says that it is safe. For the first 24 hours after the procedure: Do not sign important documents. Do not drink alcohol. Do your regular daily activities at a slower pace than normal. Eat soft foods that are easy to digest. Take over-the-counter and prescription medicines only as told by your health care provider. Keep all follow-up visits as told by your health care provider. This is important. Contact a health care provider if: You have blood in your stool 2-3 days after the procedure. Get help right away if you have: More than a small spotting of blood in your stool. Large blood clots in your stool. Swelling of your abdomen. Nausea or vomiting. A  fever. Increasing pain in your abdomen that is not relieved with medicine. Summary After the procedure, it is common to have a small amount of blood in your stool. You may also have mild cramping and bloating of your abdomen. If you were given a sedative during the procedure, it can affect you for several hours. Do not drive or operate machinery until your health care provider says that it is safe. Get help right away if you have a lot of blood in your stool, nausea or vomiting, a fever, or increased pain in your abdomen. This information is not intended to replace advice given to you by your health care provider. Make sure you discuss any questions you have with your healthcare provider. Document Revised: 10/20/2019 Document Reviewed: 05/22/2019 Elsevier Patient Education  [place] After This sheet gives you information about how to care for yourself after your procedure. Your health care provider may also give you more specific instructions. If you have problems or questions, contact your health careprovider. What can I expect after the procedure? After the procedure, it is common to have: Tiredness. Forgetfulness about what happened after the procedure. Impaired judgment for important decisions. Nausea or vomiting. Some difficulty with balance. Follow these instructions at home: For the time period you were told by your health care provider:     Rest as needed. Do not participate in activities where you could fall or become injured. Do not drive or use machinery. Do not drink alcohol. Do not take sleeping pills or medicines that cause drowsiness. Do not make important decisions or sign legal documents. Do not take care of children on your own. Eating and drinking Follow the diet that is recommended by your health care provider. Drink enough fluid to keep your urine pale yellow. If you vomit: Drink water, juice, or soup when you can drink  without vomiting. Make sure you have little or no nausea before eating solid foods. General instructions Have a responsible adult stay with you for the time you are told. It is important to have someone help care for you until you are awake and alert. Take over-the-counter and prescription medicines only as told by your health care provider. If you have sleep apnea, surgery and certain medicines can increase your risk for breathing problems. Follow instructions from your health care provider about wearing your sleep device: Anytime you are sleeping, including during daytime naps. While taking prescription pain medicines, sleeping medicines, or medicines that make you drowsy. Avoid smoking. Keep all follow-up visits as told by your health care provider. This is important. Contact a health care provider if: You keep feeling nauseous or you keep vomiting. You feel light-headed. You are still sleepy or having trouble with balance after 24 hours. You develop a rash. You  have a fever. You have redness or swelling around the IV site. Get help right away if: You have trouble breathing. You have new-onset confusion at home. Summary For several hours after your procedure, you may feel tired. You may also be forgetful and have poor judgment. Have a responsible adult stay with you for the time you are told. It is important to have someone help care for you until you are awake and alert. Rest as told. Do not drive or operate machinery. Do not drink alcohol or take sleeping pills. Get help right away if you have trouble breathing, or if you suddenly become confused. This information is not intended to replace advice given to you by your health care provider. Make sure you discuss any questions you have with your healthcare provider. Document Revised: 07/11/2020 Document Reviewed: 09/28/2019 Elsevier Patient Education  2022 [employer].

## 2021-06-10 ENCOUNTER — Other Ambulatory Visit: Payer: Self-pay

## 2021-06-10 ENCOUNTER — Encounter (HOSPITAL_COMMUNITY)
Admission: RE | Admit: 2021-06-10 | Discharge: 2021-06-10 | Disposition: A | Discharge status: Home / Self Care | Payer: Medicare Other | Source: Ambulatory Visit | Attending: Internal Medicine | Admitting: Internal Medicine

## 2021-06-10 ENCOUNTER — Encounter (HOSPITAL_COMMUNITY): Payer: Self-pay

## 2021-06-10 DIAGNOSIS — Z01810 Encounter for preprocedural cardiovascular examination: Secondary | ICD-10-CM | POA: Insufficient documentation

## 2021-06-12 ENCOUNTER — Encounter (HOSPITAL_COMMUNITY): Payer: Self-pay | Admitting: Internal Medicine

## 2021-06-12 ENCOUNTER — Ambulatory Visit (HOSPITAL_COMMUNITY): Payer: Medicare Other | Admitting: Anesthesiology

## 2021-06-12 ENCOUNTER — Other Ambulatory Visit: Payer: Self-pay

## 2021-06-12 ENCOUNTER — Ambulatory Visit (HOSPITAL_COMMUNITY)
Admission: RE | Admit: 2021-06-12 | Discharge: 2021-06-12 | Disposition: A | Discharge status: Home / Self Care | Payer: Medicare Other | Attending: Internal Medicine | Admitting: Internal Medicine

## 2021-06-12 ENCOUNTER — Encounter (HOSPITAL_COMMUNITY)
Admission: RE | Disposition: A | Discharge status: Home / Self Care | Payer: Self-pay | Source: Home / Self Care | Attending: Internal Medicine

## 2021-06-12 DIAGNOSIS — K5730 Diverticulosis of large intestine without perforation or abscess without bleeding: Secondary | ICD-10-CM | POA: Diagnosis not present

## 2021-06-12 DIAGNOSIS — K921 Melena: Secondary | ICD-10-CM | POA: Insufficient documentation

## 2021-06-12 DIAGNOSIS — K640 First degree hemorrhoids: Secondary | ICD-10-CM | POA: Diagnosis not present

## 2021-06-12 DIAGNOSIS — Z7982 Long term (current) use of aspirin: Secondary | ICD-10-CM | POA: Diagnosis not present

## 2021-06-12 DIAGNOSIS — Z85118 Personal history of other malignant neoplasm of bronchus and lung: Secondary | ICD-10-CM | POA: Insufficient documentation

## 2021-06-12 DIAGNOSIS — K635 Polyp of colon: Secondary | ICD-10-CM

## 2021-06-12 DIAGNOSIS — Z888 Allergy status to other drugs, medicaments and biological substances status: Secondary | ICD-10-CM | POA: Insufficient documentation

## 2021-06-12 DIAGNOSIS — Z7951 Long term (current) use of inhaled steroids: Secondary | ICD-10-CM | POA: Insufficient documentation

## 2021-06-12 DIAGNOSIS — D124 Benign neoplasm of descending colon: Secondary | ICD-10-CM | POA: Insufficient documentation

## 2021-06-12 DIAGNOSIS — Z882 Allergy status to sulfonamides status: Secondary | ICD-10-CM | POA: Insufficient documentation

## 2021-06-12 DIAGNOSIS — Z86010 Personal history of colonic polyps: Secondary | ICD-10-CM | POA: Diagnosis not present

## 2021-06-12 DIAGNOSIS — Z79899 Other long term (current) drug therapy: Secondary | ICD-10-CM | POA: Diagnosis not present

## 2021-06-12 DIAGNOSIS — Z87891 Personal history of nicotine dependence: Secondary | ICD-10-CM | POA: Diagnosis not present

## 2021-06-12 DIAGNOSIS — J449 Chronic obstructive pulmonary disease, unspecified: Secondary | ICD-10-CM | POA: Diagnosis not present

## 2021-06-12 HISTORY — PX: COLONOSCOPY WITH PROPOFOL: SHX5780

## 2021-06-12 SURGERY — COLONOSCOPY WITH PROPOFOL
Anesthesia: General

## 2021-06-12 MED ORDER — LIDOCAINE HCL (CARDIAC) PF 100 MG/5ML IV SOSY
PREFILLED_SYRINGE | INTRAVENOUS | Status: DC | PRN
  Administered 2021-06-12: 50 mg via INTRAVENOUS

## 2021-06-12 MED ORDER — PROPOFOL 500 MG/50ML IV EMUL
INTRAVENOUS | Status: DC | PRN
  Administered 2021-06-12: 150 ug/kg/min via INTRAVENOUS

## 2021-06-12 MED ORDER — STERILE WATER FOR IRRIGATION IR SOLN
Status: DC | PRN
  Administered 2021-06-12: 200 mL

## 2021-06-12 MED ORDER — LACTATED RINGERS IV SOLN: INTRAVENOUS | Status: DC

## 2021-06-12 MED ORDER — PROPOFOL 10 MG/ML IV BOLUS
INTRAVENOUS | Status: DC | PRN
  Administered 2021-06-12: 100 mg via INTRAVENOUS
  Administered 2021-06-12: 40 mg via INTRAVENOUS

## 2021-06-12 NOTE — Discharge Instructions (Signed)
  Colonoscopy Discharge Instructions  Read the instructions outlined below and refer to this sheet in the next few weeks. These discharge instructions provide you with general information on caring for yourself after you leave the hospital. Your doctor may also give you specific instructions. While your treatment has been planned according to the most current medical practices available, unavoidable complications occasionally occur. If you have any problems or questions after discharge, call Dr. Gala Romney at [phone]. ACTIVITY You may resume your regular activity, but move at a slower pace for the next 24 hours.  Take frequent rest periods for the next 24 hours.  Walking will help get rid of the air and reduce the bloated feeling in your belly (abdomen).  No driving for 24 hours (because of the medicine (anesthesia) used during the test).   Do not sign any important legal documents or operate any machinery for 24 hours (because of the anesthesia used during the test).  NUTRITION Drink plenty of fluids.  You may resume your normal diet as instructed by your doctor.  Begin with a light meal and progress to your normal diet. Heavy or fried foods are harder to digest and may make you feel sick to your stomach (nauseated).  Avoid alcoholic beverages for 24 hours or as instructed.  MEDICATIONS You may resume your normal medications unless your doctor tells you otherwise.  WHAT YOU CAN EXPECT TODAY Some feelings of bloating in the abdomen.  Passage of more gas than usual.  Spotting of blood in your stool or on the toilet paper.  IF YOU HAD POLYPS REMOVED DURING THE COLONOSCOPY: No aspirin products for 7 days or as instructed.  No alcohol for 7 days or as instructed.  Eat a soft diet for the next 24 hours.  FINDING OUT THE RESULTS OF YOUR TEST Not all test results are available during your visit. If your test results are not back during the visit, make an appointment with your caregiver to find out the  results. Do not assume everything is normal if you have not heard from your caregiver or the medical facility. It is important for you to follow up on all of your test results.  SEEK IMMEDIATE MEDICAL ATTENTION IF: You have more than a spotting of blood in your stool.  Your belly is swollen (abdominal distention).  You are nauseated or vomiting.  You have a temperature over 101.  You have abdominal pain or discomfort that is severe or gets worse throughout the day.    2 small polyps removed from your colon today  Diverticulosis present throughout your colon  Further recommendations to follow pending review of pathology report  At patient request, I spoke to Providence Lanius at [phone] regarding findings and recommendations

## 2021-06-12 NOTE — Anesthesia Procedure Notes (Signed)
Date/Time: 06/12/2021 11:00 AM Performed by: Orlie Dakin, CRNA Pre-anesthesia Checklist: Patient identified, Emergency Drugs available, Suction available and Patient being monitored Patient Re-evaluated:Patient Re-evaluated prior to induction Oxygen Delivery Method: Nasal cannula Induction Type: IV induction Placement Confirmation: positive ETCO2

## 2021-06-12 NOTE — H&P (Signed)
@LOGO @   Primary Care Physician:  [name], MD Primary Gastroenterologist:  Dr. Gala Romney  Pre-Procedure History & Physical: HPI:  Miguel Morales is a 75 y.o. male here for further evaluation of his episode of hematochezia this past spring.  History colonic adenomas removed over time.  Last colonoscopy 2015.  Only a single episode of paper hematochezia to this point in time.  He has no other GI symptoms.  Past Medical History:  Diagnosis Date   Aortic atherosclerosis ([place]) 01/02/2020   Seen on CT exam February 2021   Arthritis    Asthma    Cancer ([place])    skin,lung   COPD (chronic obstructive pulmonary disease) (HCC)    GERD (gastroesophageal reflux disease)    History of bronchitis    Hypertension    Mixed hyperlipidemia    Nocturia    Shortness of breath dyspnea    with exertion   Sleep apnea    will sometimes wear a CPAP   Spinal stenosis     Past Surgical History:  Procedure Laterality Date   CIRCUMCISION     COLONOSCOPY  03/04/2004   FUX:NATF-TDDUK diverticula.  The remainder of the colonic mucosa appeared normal/normal rectum   COLONOSCOPY  05/22/2009   GUR:KYHCWCB mucosa appeared normal/Left-sided diverticula/Normal rectum   COLONOSCOPY N/A 08/02/2014   Procedure: COLONOSCOPY;  Surgeon: Daneil Dolin, MD;  [facility];  Service: Endoscopy;  Laterality: N/A;  2:00 PM   KNEE ARTHROSCOPY Right 2004   LYMPH NODE DISSECTION N/A 04/08/2016   Procedure: LYMPH NODE DISSECTION;  Surgeon: Melrose Nakayama, MD;  Location: [place];  Service: Thoracic;  Laterality: N/A;   SEPTOPLASTY     VIDEO ASSISTED THORACOSCOPY (VATS)/WEDGE RESECTION Left 04/08/2016   Procedure: VIDEO ASSISTED THORACOSCOPY (VATS)/WEDGE RESECTION;  Surgeon: Melrose Nakayama, MD;  Location: [place];  Service: Thoracic;  Laterality: Left;   VIDEO BRONCHOSCOPY WITH ENDOBRONCHIAL NAVIGATION N/A 02/26/2016   Procedure: VIDEO BRONCHOSCOPY WITH ENDOBRONCHIAL NAVIGATION LEFT UPPER LOBE LUNG NODULE;   Surgeon: [name], MD;  Location: [place];  Service: Thoracic;  Laterality: N/A;    Prior to Admission medications   Medication Sig Start Date End Date Taking? Authorizing Provider  acetaminophen (TYLENOL) 500 MG tablet Take 500-1,000 mg by mouth daily.   Yes [provider]  albuterol (PROVENTIL HFA;VENTOLIN HFA) 108 (90 Base) MCG/ACT inhaler Inhale 2 puffs into the lungs every 6 (six) hours as needed for wheezing. 12/23/15  Yes Luking, Elayne Snare, MD  amLODipine (NORVASC) 10 MG tablet TAKE 1 TABLET DAILY Patient taking differently: Take 10 mg by mouth daily. 07/01/16  Yes [name], MD  Ascorbic Acid (VITAMIN C) 1000 MG tablet Take 1,000 mg by mouth daily.   Yes [provider]  aspirin 81 MG tablet Take 81 mg by mouth daily.   Yes [provider]  atorvastatin (LIPITOR) 80 MG tablet Take 1 tablet (80 mg total) by mouth daily. 05/01/20  Yes Luking, Elayne Snare, MD  azelastine (ASTELIN) 0.1 % nasal spray Place 1 spray into both nostrils 2 (two) times daily. Use in each nostril as directed 03/18/18  Yes Luking, Elayne Snare, MD  Carboxymethylcellulose Sodium (THERATEARS) 0.25 % SOLN Place 1 drop into both eyes daily as needed (dry eyes).   Yes [provider]  Cholecalciferol (VITAMIN D) 50 MCG (2000 UT) tablet Take 2,000 Units by mouth daily.   Yes [provider]  diclofenac Sodium (VOLTAREN) 1 % GEL Apply 1 application topically  daily as needed for pain. 05/31/21  Yes [provider]  fluticasone (FLONASE) 50 MCG/ACT nasal spray USE 1 SPRAY IN EACH NOSTRIL DAILY AS NEEDED Patient taking differently: Place 1 spray into both nostrils daily as needed for allergies. 04/27/16  Yes Luking, Elayne Snare, MD  fluticasone-salmeterol (ADVAIR) 250-50 MCG/ACT AEPB Inhale 1 puff into the lungs in the morning and at bedtime.   Yes [provider]  gabapentin (NEURONTIN) 100 MG capsule Take 300 mg by mouth at bedtime. 11/18/18  Yes [provider]   hydrOXYzine (ATARAX/VISTARIL) 10 MG tablet Take 10 mg by mouth at bedtime.    Yes [provider]  ibuprofen (ADVIL,MOTRIN) 200 MG tablet Take 600 mg by mouth daily.   Yes [provider]  lidocaine (XYLOCAINE) 5 % ointment Apply 1 application topically daily as needed for mild pain (For Back and hip).   Yes [provider]  magnesium 30 MG tablet Take 30 mg by mouth daily at 6 (six) AM. Over the counter dose   Yes [provider]  omeprazole (PRILOSEC) 20 MG capsule TAKE 1 CAPSULE DAILY AS NEEDED Patient taking differently: Take 20 mg by mouth daily. 09/21/16  Yes [name], MD  tiotropium (SPIRIVA HANDIHALER) 18 MCG inhalation capsule Place 1 capsule (18 mcg total) into inhaler and inhale daily. 01/31/19  Yes [name], MD  zinc gluconate 50 MG tablet Take 50 mg by mouth daily.   Yes [provider]  clobetasol (TEMOVATE) 0.05 % external solution Apply 1 application topically every other day. 05/31/21   [provider]  polyethylene glycol-electrolytes (NULYTELY) 420 g solution As directed Patient not taking: Reported on 06/04/2021 04/29/21   Daneil Dolin, MD    Allergies as of 04/29/2021 - Review Complete 04/29/2021  Allergen Reaction Noted   Adhesive [tape] Other (See Comments) 04/09/2016   Sulfa antibiotics Hives 12/28/2011    Family History  Problem Relation Age of Onset   Cancer Mother     Social History   Socioeconomic History   Marital status: Married    Spouse name: Not on file   Number of children: Not on file   Years of education: Not on file   Highest education level: Not on file  Occupational History   Not on file  Tobacco Use   Smoking status: Former    Packs/day: 1.00    Years: 30.00    Pack years: 30.00    Types: Cigarettes    Start date: 11/09/1958    Quit date: 11/10/2003    Years since quitting: 17.6   Smokeless tobacco: Never  Vaping Use   Vaping Use: Never used  Substance and Sexual  Activity   Alcohol use: Yes    Alcohol/week: 3.0 standard drinks    Types: 3 Glasses of wine per week    Comment: Occasionally   Drug use: No   Sexual activity: Yes  Other Topics Concern   Not on file  Social History Narrative   Not on file   Social Determinants of Health   Financial Resource Strain: Not on file  Food Insecurity: Not on file  Transportation Needs: Not on file  Physical Activity: Not on file  Stress: Not on file  Social Connections: Not on file  Intimate Partner Violence: Not on file    Review of Systems: See HPI, otherwise negative ROS  Physical Exam: BP 130/79   Temp 98.3 F (36.8 C) (Oral)   Resp 17   Ht 5\' 6"  (  1.676 m)   Wt 90.7 kg   SpO2 94%   BMI 32.28 kg/m  General:   Alert,  Well-developed, well-nourished, pleasant and cooperative in NAD Neck:  Supple; no masses or thyromegaly. No significant cervical adenopathy. Lungs:  Clear throughout to auscultation.   No wheezes, crackles, or rhonchi. No acute distress. Heart:  Regular rate and rhythm; no murmurs, clicks, rubs,  or gallops. Abdomen: Non-distended, normal bowel sounds.  Soft and nontender without appreciable mass or hepatosplenomegaly.  Pulses:  Normal pulses noted. Extremities:  Without clubbing or edema.  Impression/Plan: 75 year old gent with history colonic adenomas; single episode of painless paper hematochezia earlier this year.  Here for diagnostic colonoscopy per plan. The risks, benefits, limitations, alternatives and imponderables have been reviewed with the patient. Questions have been answered. All parties are agreeable.      Notice: This dictation was prepared with Dragon dictation along with smaller phrase technology. Any transcriptional errors that result from this process are unintentional and may not be corrected upon review.

## 2021-06-12 NOTE — Transfer of Care (Signed)
Immediate Anesthesia Transfer of Care Note  Patient: Miguel Morales  Procedure(s) Performed: COLONOSCOPY WITH PROPOFOL  Patient Location: Short Stay  Anesthesia Type:General  Level of Consciousness: awake, alert  and oriented  Airway & Oxygen Therapy: Patient Spontanous Breathing  Post-op Assessment: Report given to RN, Post -op Vital signs reviewed and stable and Patient moving all extremities X 4  Post vital signs: Reviewed and stable  Last Vitals:  Vitals Value Taken Time  BP    Temp    Pulse    Resp    SpO2      Last Pain:  Vitals:   06/12/21 0952  TempSrc: Oral  PainSc: 0-No pain      Patients Stated Pain Goal: 5 (33/61/22 4497)  Complications: No notable events documented.

## 2021-06-12 NOTE — Anesthesia Postprocedure Evaluation (Signed)
Anesthesia Post Note  Patient: Miguel Morales  Procedure(s) Performed: COLONOSCOPY WITH PROPOFOL  Patient location during evaluation: Phase II Anesthesia Type: General Level of consciousness: awake and alert and oriented Pain management: pain level controlled Vital Signs Assessment: post-procedure vital signs reviewed and stable Respiratory status: spontaneous breathing and respiratory function stable Cardiovascular status: blood pressure returned to baseline and stable Postop Assessment: no apparent nausea or vomiting Anesthetic complications: no   No notable events documented.   Last Vitals:  Vitals:   06/12/21 0952 06/12/21 1117  BP: 130/79 104/67  Pulse:  79  Resp: 17 17  Temp: 36.8 C 36.7 C  SpO2: 94% 98%    Last Pain:  Vitals:   06/12/21 1117  TempSrc: Oral  PainSc: 0-No pain                 Geni Skorupski C Krisha Beegle

## 2021-06-12 NOTE — Op Note (Signed)
[facility] Patient Name: Miguel Morales Procedure Date: 06/12/2021 10:35 AM MRN: [id number] Date of Birth: 01/29/1946 Attending MD: Norvel Richards , MD CSN: [id number] Age: 75 Admit Type: Outpatient Procedure:                Colonoscopy Indications:              Hematochezia Providers:                Norvel Richards, MD, Lurline Del, RN Referring MD:              Medicines:                Propofol per Anesthesia Complications:            No immediate complications. Estimated Blood Loss:     Estimated blood loss was minimal. Procedure:                Pre-Anesthesia Assessment:                           - Prior to the procedure, a History and Physical                            was performed, and patient medications and                            allergies were reviewed. The patient's tolerance of                            previous anesthesia was also reviewed. The risks                            and benefits of the procedure and the sedation                            options and risks were discussed with the patient.                            All questions were answered, and informed consent                            was obtained. Prior Anticoagulants: The patient has                            taken no previous anticoagulant or antiplatelet                            agents. ASA Grade Assessment: III - A patient with                            severe systemic disease. After reviewing the risks                            and benefits, the patient was deemed in  satisfactory condition to undergo the procedure.                           After obtaining informed consent, the colonoscope                            was passed under direct vision. Throughout the                            procedure, the patient's blood pressure, pulse, and                            oxygen saturations were monitored continuously. The                             [id number]) scope was introduced through the                            anus and advanced to the the cecum, identified by                            appendiceal orifice and ileocecal valve. The                            colonoscopy was performed without difficulty. The                            patient tolerated the procedure well. The quality                            of the bowel preparation was adequate. Scope In: 10:54:46 AM Scope Out: 11:12:44 AM Scope Withdrawal Time: 0 hours 11 minutes 42 seconds  Total Procedure Duration: 0 hours 17 minutes 58 seconds  Findings:      The perianal and digital rectal examinations were normal.      Multiple small and large-mouthed diverticula were found in the entire       colon.      Two semi-pedunculated polyps were found in the sigmoid colon and       descending colon. The polyps were 3 to 5 mm in size. These polyps were       removed with a cold snare. Resection and retrieval were complete.       Estimated blood loss was minimal.      Non-bleeding internal hemorrhoids were found during retroflexion. The       hemorrhoids were mild, small and Grade I (internal hemorrhoids that do       not prolapse).      The exam was otherwise without abnormality on direct and retroflexion       views. Impression:               - Diverticulosis in the entire examined colon.                           - Two 3 to 5 mm polyps in the sigmoid colon and in  the descending colon, removed with a cold snare.                            Resected and retrieved.                           - Non-bleeding internal hemorrhoids.                           - The examination was otherwise normal on direct                            and retroflexion views. I suspect trivial benign                            ano-rectal bleeding. Moderate Sedation:      Moderate (conscious) sedation was personally administered by an       anesthesia professional.  The following parameters were monitored: oxygen       saturation, heart rate, blood pressure, respiratory rate, EKG, adequacy       of pulmonary ventilation, and response to care. Recommendation:           - Patient has a contact number available for                            emergencies. The signs and symptoms of potential                            delayed complications were discussed with the                            patient. Return to normal activities tomorrow.                            Written discharge instructions were provided to the                            patient.                           - Resume previous diet.                           - Continue present medications.                           - Repeat colonoscopy date to be determined after                            pending pathology results are reviewed for                            surveillance based on pathology results.                           - Return to GI office (date not yet determined). Procedure Code(s):        ---  Professional ---                           [phone], Colonoscopy, flexible; with removal of                            tumor(s), polyp(s), or other lesion(s) by snare                            technique Diagnosis Code(s):        --- Professional ---                           K64.0, First degree hemorrhoids                           K63.5, Polyp of colon                           K92.1, Melena (includes Hematochezia)                           K57.30, Diverticulosis of large intestine without                            perforation or abscess without bleeding CPT copyright 2019 American Medical Association. All rights reserved. The codes documented in this report are preliminary and upon coder review may  be revised to meet current compliance requirements. Cristopher Estimable. Heather Mckendree, MD Norvel Richards, MD 06/12/2021 11:30:17 AM This report has been signed electronically. Number of Addenda: 0

## 2021-06-12 NOTE — Anesthesia Preprocedure Evaluation (Signed)
Anesthesia Evaluation  Patient identified by MRN, date of birth, ID band Patient awake    Reviewed: Allergy & Precautions, NPO status , Patient's Chart, lab work & pertinent test results  History of Anesthesia Complications Negative for: history of anesthetic complications  Airway Mallampati: II  TM Distance: >3 FB Neck ROM: Full    Dental  (+) Dental Advisory Given   Pulmonary shortness of breath and with exertion, asthma , sleep apnea and Continuous Positive Airway Pressure Ventilation , COPD, former smoker,  Left lung cancer   Pulmonary exam normal breath sounds clear to auscultation       Cardiovascular Exercise Tolerance: Good hypertension, Pt. on medications Normal cardiovascular exam Rhythm:Regular Rate:Normal     Neuro/Psych negative neurological ROS  negative psych ROS   GI/Hepatic Neg liver ROS, GERD  Medicated and Controlled,  Endo/Other  diabetes, Well Controlled, Type 2, Oral Hypoglycemic Agents  Renal/GU negative Renal ROS     Musculoskeletal  (+) Arthritis  (spinal stenosis, back sx),   Abdominal   Peds  Hematology   Anesthesia Other Findings Left lung cancer  Reproductive/Obstetrics                             Anesthesia Physical Anesthesia Plan  ASA: 3  Anesthesia Plan: General   Post-op Pain Management:    Induction: Intravenous  PONV Risk Score and Plan: Propofol infusion  Airway Management Planned: Nasal Cannula and Natural Airway  Additional Equipment:   Intra-op Plan:   Post-operative Plan:   Informed Consent: I have reviewed the patients History and Physical, chart, labs and discussed the procedure including the risks, benefits and alternatives for the proposed anesthesia with the patient or authorized representative who has indicated his/her understanding and acceptance.     Dental advisory given  Plan Discussed with: CRNA and  Surgeon  Anesthesia Plan Comments:         Anesthesia Quick Evaluation

## 2021-06-16 LAB — SURGICAL PATHOLOGY

## 2021-06-17 ENCOUNTER — Encounter: Payer: Self-pay | Admitting: Emergency Medicine

## 2021-06-17 ENCOUNTER — Ambulatory Visit
Admission: EM | Admit: 2021-06-17 | Discharge: 2021-06-17 | Disposition: A | Discharge status: Home / Self Care | Payer: Medicare Other | Attending: Family Medicine | Admitting: Family Medicine

## 2021-06-17 ENCOUNTER — Ambulatory Visit (INDEPENDENT_AMBULATORY_CARE_PROVIDER_SITE_OTHER): Payer: Medicare Other

## 2021-06-17 DIAGNOSIS — R0602 Shortness of breath: Secondary | ICD-10-CM

## 2021-06-17 DIAGNOSIS — J439 Emphysema, unspecified: Secondary | ICD-10-CM | POA: Diagnosis not present

## 2021-06-17 DIAGNOSIS — R059 Cough, unspecified: Secondary | ICD-10-CM | POA: Diagnosis not present

## 2021-06-17 DIAGNOSIS — J209 Acute bronchitis, unspecified: Secondary | ICD-10-CM | POA: Diagnosis not present

## 2021-06-17 MED ORDER — DEXAMETHASONE SODIUM PHOSPHATE 10 MG/ML IJ SOLN
10.0000 mg | Freq: Once | INTRAMUSCULAR | Status: AC
  Administered 2021-06-17: 10 mg via INTRAMUSCULAR

## 2021-06-17 MED ORDER — DOXYCYCLINE HYCLATE 100 MG PO CAPS: 100.0000 mg | ORAL_CAPSULE | Freq: Two times a day (BID) | ORAL | 0 refills | Status: DC

## 2021-06-17 MED ORDER — PREDNISONE 20 MG PO TABS: 40.0000 mg | ORAL_TABLET | Freq: Every day | ORAL | 0 refills | Status: DC

## 2021-06-17 MED ORDER — PROMETHAZINE-DM 6.25-15 MG/5ML PO SYRP: 5.0000 mL | ORAL_SOLUTION | Freq: Four times a day (QID) | ORAL | 0 refills | Status: DC | PRN

## 2021-06-17 NOTE — ED Triage Notes (Signed)
Pt presents today with c/o productive cough with chest/nasal congestion x 4-5 days. +low grade fever

## 2021-06-17 NOTE — ED Provider Notes (Signed)
[facility]    CSN: [id number] Arrival date & time: 06/17/21  1255      History   Chief Complaint Chief Complaint  Patient presents with   Cough   Fever   Nasal Congestion    HPI Miguel Morales is a 75 y.o. male.   HPI Patient with a known history of COPD, hypertension and hyperlipidemia and non-small cell lung cancer presents today with cough, congestion sinus pressure x4 days.  Patient has a low-grade fever on arrival report he has had fever over the course of the last week.  He has shortness of breath and wheezing at baseline however endorses shortness of breath has increased with upper respiratory symptoms.  He denies any known exposure anyone has been sick.  He does not feel this COVID Alice Reichert is related to his regular upper respiratory illness.  Past Medical History:  Diagnosis Date   Aortic atherosclerosis ([place]) 01/02/2020   Seen on CT exam February 2021   Arthritis    Asthma    Cancer ([place])    skin,lung   COPD (chronic obstructive pulmonary disease) (HCC)    GERD (gastroesophageal reflux disease)    History of bronchitis    Hypertension    Mixed hyperlipidemia    Nocturia    Shortness of breath dyspnea    with exertion   Sleep apnea    will sometimes wear a CPAP   Spinal stenosis     Patient Active Problem List   Diagnosis Date Noted   Hyperlipidemia associated with type 2 diabetes mellitus ([place]) 05/01/2020   Aortic atherosclerosis ([place]) 01/02/2020   Spondylolisthesis at L4-L5 level 01/06/2019   Need for prophylactic vaccination and inoculation against influenza 07/21/2016   Nodule of left lung 04/08/2016   Moderate COPD (chronic obstructive pulmonary disease) ([place]) 03/18/2016   Non-small cell carcinoma of left lung, stage 1 ([place]) 03/18/2016   Dyspnea 07/04/2014   Lung nodule 07/04/2014   Hyperglycemia 04/12/2014   Lung nodule 53mm Left Upper Lobe Ground Glass and 20mm Right Right Lower lobe - seen April 2015 and NEW problem 02/25/2014   Cancer  screening 01/13/2014   Lumbar stenosis with neurogenic claudication 11/07/2013   Sacroiliac joint dysfunction 11/07/2013   HTN (hypertension), benign 10/24/2013   Spinal stenosis, lumbar region, with neurogenic claudication 06/20/2013   Shortness of breath 03/23/2012   Mixed hyperlipidemia 03/23/2012    Past Surgical History:  Procedure Laterality Date   CIRCUMCISION     COLONOSCOPY  03/04/2004   JKK:[id number]-[id number] diverticula.  The remainder of the colonic mucosa appeared normal/normal rectum   COLONOSCOPY  05/22/2009   ZJI:[id number] mucosa appeared normal/Left-sided diverticula/Normal rectum   COLONOSCOPY N/A 08/02/2014   Procedure: COLONOSCOPY;  Surgeon: Daneil Dolin, MD;  [facility];  Service: Endoscopy;  Laterality: N/A;  2:00 PM   KNEE ARTHROSCOPY Right 2004   LYMPH NODE DISSECTION N/A 04/08/2016   Procedure: LYMPH NODE DISSECTION;  Surgeon: Melrose Nakayama, MD;  Location: [address];  Service: Thoracic;  Laterality: N/A;   SEPTOPLASTY     VIDEO ASSISTED THORACOSCOPY (VATS)/WEDGE RESECTION Left 04/08/2016   Procedure: VIDEO ASSISTED THORACOSCOPY (VATS)/WEDGE RESECTION;  Surgeon: Melrose Nakayama, MD;  Location: [place];  Service: Thoracic;  Laterality: Left;   VIDEO BRONCHOSCOPY WITH ENDOBRONCHIAL NAVIGATION N/A 02/26/2016   Procedure: VIDEO BRONCHOSCOPY WITH ENDOBRONCHIAL NAVIGATION LEFT UPPER LOBE LUNG NODULE;  Surgeon: [name], MD;  Location: [place];  Service: Thoracic;  Laterality: N/A;  Home Medications    Prior to Admission medications   Medication Sig Start Date End Date Taking? Authorizing Provider  doxycycline (VIBRAMYCIN) 100 MG capsule Take 1 capsule (100 mg total) by mouth 2 (two) times daily. 06/17/21  Yes Scot Jun, FNP  predniSONE (DELTASONE) 20 MG tablet Take 2 tablets (40 mg total) by mouth daily with breakfast. 06/17/21  Yes Scot Jun, FNP  promethazine-dextromethorphan (PROMETHAZINE-DM) 6.25-15 MG/5ML syrup Take 5 mLs by  mouth 4 (four) times daily as needed for cough. 06/17/21  Yes Scot Jun, FNP  acetaminophen (TYLENOL) 500 MG tablet Take 500-1,000 mg by mouth daily.    [provider]  albuterol (PROVENTIL HFA;VENTOLIN HFA) 108 (90 Base) MCG/ACT inhaler Inhale 2 puffs into the lungs every 6 (six) hours as needed for wheezing. 12/23/15   [name], MD  amLODipine (NORVASC) 10 MG tablet TAKE 1 TABLET DAILY Patient taking differently: Take 10 mg by mouth daily. 07/01/16   [name], MD  Ascorbic Acid (VITAMIN C) 1000 MG tablet Take 1,000 mg by mouth daily.    [provider]  aspirin 81 MG tablet Take 81 mg by mouth daily.    [provider]  atorvastatin (LIPITOR) 80 MG tablet Take 1 tablet (80 mg total) by mouth daily. 05/01/20   [name], MD  azelastine (ASTELIN) 0.1 % nasal spray Place 1 spray into both nostrils 2 (two) times daily. Use in each nostril as directed 03/18/18   [name], MD  Carboxymethylcellulose Sodium (THERATEARS) 0.25 % SOLN Place 1 drop into both eyes daily as needed (dry eyes).    [provider]  Cholecalciferol (VITAMIN D) 50 MCG (2000 UT) tablet Take 2,000 Units by mouth daily.    [provider]  clobetasol (TEMOVATE) 0.05 % external solution Apply 1 application topically every other day. 05/31/21   [provider]  diclofenac Sodium (VOLTAREN) 1 % GEL Apply 1 application topically daily as needed for pain. 05/31/21   [provider]  fluticasone (FLONASE) 50 MCG/ACT nasal spray USE 1 SPRAY IN EACH NOSTRIL DAILY AS NEEDED Patient taking differently: Place 1 spray into both nostrils daily as needed for allergies. 04/27/16   [name], MD  fluticasone-salmeterol (ADVAIR) 250-50 MCG/ACT AEPB Inhale 1 puff into the lungs in the morning and at bedtime.    [provider]  gabapentin (NEURONTIN) 100 MG capsule Take 300 mg by mouth at bedtime. 11/18/18   [provider]  hydrOXYzine  (ATARAX/VISTARIL) 10 MG tablet Take 10 mg by mouth at bedtime.     [provider]  ibuprofen (ADVIL,MOTRIN) 200 MG tablet Take 600 mg by mouth daily.    [provider]  lidocaine (XYLOCAINE) 5 % ointment Apply 1 application topically daily as needed for mild pain (For Back and hip).    [provider]  magnesium 30 MG tablet Take 30 mg by mouth daily at 6 (six) AM. Over the counter dose    [provider]  omeprazole (PRILOSEC) 20 MG capsule TAKE 1 CAPSULE DAILY AS NEEDED Patient taking differently: Take 20 mg by mouth daily. 09/21/16   [name], MD  polyethylene glycol-electrolytes (NULYTELY) 420 g solution As directed Patient not taking: Reported on 06/04/2021 04/29/21   Daneil Dolin, MD  tiotropium (SPIRIVA HANDIHALER) 18 MCG inhalation capsule Place 1 capsule (18 mcg total) into inhaler and inhale daily. 01/31/19   [name], MD  zinc gluconate 50 MG tablet Take  50 mg by mouth daily.    [provider]    Family History Family History  Problem Relation Age of Onset   Cancer Mother     Social History Social History   Tobacco Use   Smoking status: Former    Packs/day: 1.00    Years: 30.00    Pack years: 30.00    Types: Cigarettes    Start date: 11/09/1958    Quit date: 11/10/2003    Years since quitting: 17.6   Smokeless tobacco: Never  Vaping Use   Vaping Use: Never used  Substance Use Topics   Alcohol use: Yes    Alcohol/week: 3.0 standard drinks    Types: 3 Glasses of wine per week    Comment: Occasionally   Drug use: No     Allergies   Adhesive [tape] and Sulfa antibiotics   Review of Systems Review of Systems Pertinent negatives listed in HPI   Physical Exam Triage Vital Signs ED Triage Vitals  Enc Vitals Group     BP 06/17/21 1541 131/73     Pulse Rate 06/17/21 1541 92     Resp 06/17/21 1541 (!) 22     Temp 06/17/21 1541 99.6 F (37.6 C)     Temp src --      SpO2 06/17/21 1541 94 %     Weight  --      Height --      Head Circumference --      Peak Flow --      Pain Score 06/17/21 1542 3     Pain Loc --      Pain Edu? --      Excl. in [place]? --    No data found.  Updated Vital Signs BP 131/73 (BP Location: Right Arm)   Pulse 92   Temp 99.6 F (37.6 C)   Resp (!) 22   SpO2 94%   Visual Acuity Right Eye Distance:   Left Eye Distance:   Bilateral Distance:    Right Eye Near:   Left Eye Near:    Bilateral Near:     Physical Exam General appearance: alert, Ill-appearing, no distress Head: Normocephalic, without obvious abnormality, atraumatic ENT: Ears normal, nares with mucosal edema, congestion, oropharynx clear Respiratory: Respirations even , unlabored, coarse lung sound, expiratory wheeze Heart: Rate and rhythm normal. No gallop or murmurs noted on exam  Abdomen: BS +, no distention, no rebound tenderness, or no mass Extremities: No gross deformities Skin: Skin color, texture, turgor normal. No rashes seen  Psych: Appropriate mood and affect. Neurologic: GCS 15, normal coordination normal gait UC Treatments / Results  Labs (all labs ordered are listed, but only abnormal results are displayed) Labs Reviewed - No data to display  EKG   Radiology DG Chest 2 View  Result Date: 06/17/2021 CLINICAL DATA:  Shortness of breath and cough EXAM: CHEST - 2 VIEW COMPARISON:  10/09/2019, CT 06/02/2021 FINDINGS: Emphysema. Postsurgical changes and scarring at the lingula. No acute consolidation, pleural effusion or pneumothorax. Normal cardiomediastinal silhouette with aortic atherosclerosis. IMPRESSION: No active cardiopulmonary disease. Postsurgical changes at the lingula. Electronically Signed   By: Donavan Foil M.D.   On: 06/17/2021 16:14    Procedures Procedures (including critical care time)  Medications Ordered in UC Medications  dexamethasone (DECADRON) injection 10 mg (has no administration in time range)    Initial Impression / Assessment and Plan / UC  Course  I have reviewed the triage vital signs and the nursing  notes.  Pertinent labs & imaging results that were available during my care of the patient were reviewed by me and considered in my medical decision making (see chart for details).    Chest x-ray significant for chronic changes only.  No acute processes noted.  We will treat for acute bronchitis.  Treatment instructions per discharge orders.  Advised to continue chronic maintenance inhalers and continue rescue inhaler for acute wheezing or shortness of breath.  Return precautions given if symptoms fail to improve ER if breathing or wheezing worsens. Final Clinical Impressions(s) / UC Diagnoses   Final diagnoses:  Acute bronchitis, unspecified organism   Discharge Instructions   None    ED Prescriptions     Medication Sig Dispense Auth. Provider   predniSONE (DELTASONE) 20 MG tablet Take 2 tablets (40 mg total) by mouth daily with breakfast. 10 tablet Scot Jun, FNP   doxycycline (VIBRAMYCIN) 100 MG capsule Take 1 capsule (100 mg total) by mouth 2 (two) times daily. 20 capsule Scot Jun, FNP   promethazine-dextromethorphan (PROMETHAZINE-DM) 6.25-15 MG/5ML syrup Take 5 mLs by mouth 4 (four) times daily as needed for cough. 140 mL Scot Jun, FNP      PDMP not reviewed this encounter.   Scot Jun, FNP 06/17/21 [phone]

## 2021-06-18 ENCOUNTER — Encounter: Payer: Self-pay | Admitting: Internal Medicine

## 2021-06-20 ENCOUNTER — Encounter (HOSPITAL_COMMUNITY): Payer: Self-pay | Admitting: Internal Medicine

## 2021-08-11 DIAGNOSIS — M5116 Intervertebral disc disorders with radiculopathy, lumbar region: Secondary | ICD-10-CM | POA: Diagnosis not present

## 2021-08-11 DIAGNOSIS — M5416 Radiculopathy, lumbar region: Secondary | ICD-10-CM | POA: Diagnosis not present

## 2021-08-21 ENCOUNTER — Telehealth: Payer: Self-pay | Admitting: Family Medicine

## 2021-08-21 NOTE — Telephone Encounter (Signed)
Call from [place] requesting most recent office notes. Miguel Morales received referral from [place] for home health signed by Dr.Scott on 08/11/21. Pt needs to have appointment. Will need to call Miguel Morales back with appt date also. [name] will be faxing over referral form that was sent to him.  Please call pt to have him schedule appt. Thank you. Please send message back to nurses after appt has been scheduled. Thank you!  [name] [phone] Fax [phone]

## 2021-08-22 NOTE — Telephone Encounter (Signed)
Calvin left message on voicemail. Pt was spoken with yesterday and let their scheduler know that he will be out of town for the next 19 days. Calvin needs new referral also needs office visit as previously stated. Please contact pt to have him schedule follow up. Thank you!

## 2021-10-22 DIAGNOSIS — M5033 Other cervical disc degeneration, cervicothoracic region: Secondary | ICD-10-CM | POA: Diagnosis not present

## 2021-10-22 DIAGNOSIS — M9903 Segmental and somatic dysfunction of lumbar region: Secondary | ICD-10-CM | POA: Diagnosis not present

## 2021-10-22 DIAGNOSIS — M5136 Other intervertebral disc degeneration, lumbar region: Secondary | ICD-10-CM | POA: Diagnosis not present

## 2021-10-22 DIAGNOSIS — M9901 Segmental and somatic dysfunction of cervical region: Secondary | ICD-10-CM | POA: Diagnosis not present

## 2021-10-23 DIAGNOSIS — M5136 Other intervertebral disc degeneration, lumbar region: Secondary | ICD-10-CM | POA: Diagnosis not present

## 2021-10-23 DIAGNOSIS — M9903 Segmental and somatic dysfunction of lumbar region: Secondary | ICD-10-CM | POA: Diagnosis not present

## 2021-10-23 DIAGNOSIS — M9901 Segmental and somatic dysfunction of cervical region: Secondary | ICD-10-CM | POA: Diagnosis not present

## 2021-10-23 DIAGNOSIS — M5033 Other cervical disc degeneration, cervicothoracic region: Secondary | ICD-10-CM | POA: Diagnosis not present

## 2021-10-28 DIAGNOSIS — M5033 Other cervical disc degeneration, cervicothoracic region: Secondary | ICD-10-CM | POA: Diagnosis not present

## 2021-10-28 DIAGNOSIS — M9903 Segmental and somatic dysfunction of lumbar region: Secondary | ICD-10-CM | POA: Diagnosis not present

## 2021-10-28 DIAGNOSIS — M5136 Other intervertebral disc degeneration, lumbar region: Secondary | ICD-10-CM | POA: Diagnosis not present

## 2021-10-28 DIAGNOSIS — M9901 Segmental and somatic dysfunction of cervical region: Secondary | ICD-10-CM | POA: Diagnosis not present

## 2021-10-29 DIAGNOSIS — M5136 Other intervertebral disc degeneration, lumbar region: Secondary | ICD-10-CM | POA: Diagnosis not present

## 2021-10-29 DIAGNOSIS — M9901 Segmental and somatic dysfunction of cervical region: Secondary | ICD-10-CM | POA: Diagnosis not present

## 2021-10-29 DIAGNOSIS — M9903 Segmental and somatic dysfunction of lumbar region: Secondary | ICD-10-CM | POA: Diagnosis not present

## 2021-10-29 DIAGNOSIS — M5033 Other cervical disc degeneration, cervicothoracic region: Secondary | ICD-10-CM | POA: Diagnosis not present

## 2021-11-05 DIAGNOSIS — M9903 Segmental and somatic dysfunction of lumbar region: Secondary | ICD-10-CM | POA: Diagnosis not present

## 2021-11-05 DIAGNOSIS — M5136 Other intervertebral disc degeneration, lumbar region: Secondary | ICD-10-CM | POA: Diagnosis not present

## 2021-11-05 DIAGNOSIS — M9901 Segmental and somatic dysfunction of cervical region: Secondary | ICD-10-CM | POA: Diagnosis not present

## 2021-11-05 DIAGNOSIS — M5033 Other cervical disc degeneration, cervicothoracic region: Secondary | ICD-10-CM | POA: Diagnosis not present

## 2021-11-06 DIAGNOSIS — M9903 Segmental and somatic dysfunction of lumbar region: Secondary | ICD-10-CM | POA: Diagnosis not present

## 2021-11-06 DIAGNOSIS — M5033 Other cervical disc degeneration, cervicothoracic region: Secondary | ICD-10-CM | POA: Diagnosis not present

## 2021-11-06 DIAGNOSIS — M9901 Segmental and somatic dysfunction of cervical region: Secondary | ICD-10-CM | POA: Diagnosis not present

## 2021-11-06 DIAGNOSIS — M5136 Other intervertebral disc degeneration, lumbar region: Secondary | ICD-10-CM | POA: Diagnosis not present

## 2021-11-10 DIAGNOSIS — M9903 Segmental and somatic dysfunction of lumbar region: Secondary | ICD-10-CM | POA: Diagnosis not present

## 2021-11-10 DIAGNOSIS — M5136 Other intervertebral disc degeneration, lumbar region: Secondary | ICD-10-CM | POA: Diagnosis not present

## 2021-11-10 DIAGNOSIS — M9901 Segmental and somatic dysfunction of cervical region: Secondary | ICD-10-CM | POA: Diagnosis not present

## 2021-11-10 DIAGNOSIS — M5033 Other cervical disc degeneration, cervicothoracic region: Secondary | ICD-10-CM | POA: Diagnosis not present

## 2021-11-12 DIAGNOSIS — M9903 Segmental and somatic dysfunction of lumbar region: Secondary | ICD-10-CM | POA: Diagnosis not present

## 2021-11-12 DIAGNOSIS — M9901 Segmental and somatic dysfunction of cervical region: Secondary | ICD-10-CM | POA: Diagnosis not present

## 2021-11-12 DIAGNOSIS — M5033 Other cervical disc degeneration, cervicothoracic region: Secondary | ICD-10-CM | POA: Diagnosis not present

## 2021-11-12 DIAGNOSIS — M5136 Other intervertebral disc degeneration, lumbar region: Secondary | ICD-10-CM | POA: Diagnosis not present

## 2021-11-13 DIAGNOSIS — M9903 Segmental and somatic dysfunction of lumbar region: Secondary | ICD-10-CM | POA: Diagnosis not present

## 2021-11-13 DIAGNOSIS — M9901 Segmental and somatic dysfunction of cervical region: Secondary | ICD-10-CM | POA: Diagnosis not present

## 2021-11-13 DIAGNOSIS — M5033 Other cervical disc degeneration, cervicothoracic region: Secondary | ICD-10-CM | POA: Diagnosis not present

## 2021-11-13 DIAGNOSIS — M5136 Other intervertebral disc degeneration, lumbar region: Secondary | ICD-10-CM | POA: Diagnosis not present

## 2021-11-17 DIAGNOSIS — J441 Chronic obstructive pulmonary disease with (acute) exacerbation: Secondary | ICD-10-CM | POA: Diagnosis not present

## 2021-11-17 DIAGNOSIS — M5136 Other intervertebral disc degeneration, lumbar region: Secondary | ICD-10-CM | POA: Diagnosis not present

## 2021-11-17 DIAGNOSIS — M5033 Other cervical disc degeneration, cervicothoracic region: Secondary | ICD-10-CM | POA: Diagnosis not present

## 2021-11-17 DIAGNOSIS — M9901 Segmental and somatic dysfunction of cervical region: Secondary | ICD-10-CM | POA: Diagnosis not present

## 2021-11-17 DIAGNOSIS — M9903 Segmental and somatic dysfunction of lumbar region: Secondary | ICD-10-CM | POA: Diagnosis not present

## 2021-11-18 ENCOUNTER — Ambulatory Visit: Payer: Medicare Other | Admitting: Family Medicine

## 2021-11-18 DIAGNOSIS — M5136 Other intervertebral disc degeneration, lumbar region: Secondary | ICD-10-CM | POA: Diagnosis not present

## 2021-11-18 DIAGNOSIS — M9901 Segmental and somatic dysfunction of cervical region: Secondary | ICD-10-CM | POA: Diagnosis not present

## 2021-11-18 DIAGNOSIS — M5033 Other cervical disc degeneration, cervicothoracic region: Secondary | ICD-10-CM | POA: Diagnosis not present

## 2021-11-18 DIAGNOSIS — M9903 Segmental and somatic dysfunction of lumbar region: Secondary | ICD-10-CM | POA: Diagnosis not present

## 2021-11-27 ENCOUNTER — Telehealth: Payer: Self-pay | Admitting: *Deleted

## 2021-11-27 DIAGNOSIS — Z125 Encounter for screening for malignant neoplasm of prostate: Secondary | ICD-10-CM

## 2021-11-27 DIAGNOSIS — E1169 Type 2 diabetes mellitus with other specified complication: Secondary | ICD-10-CM

## 2021-11-27 DIAGNOSIS — R739 Hyperglycemia, unspecified: Secondary | ICD-10-CM

## 2021-11-27 DIAGNOSIS — Z79899 Other long term (current) drug therapy: Secondary | ICD-10-CM

## 2021-11-27 NOTE — Telephone Encounter (Signed)
Lipid, liver, metabolic 7, urine ACR, W8S Please let patient know Urologic guidelines state stopping checking PSAs by the mid 70s. I do not recommend checking PSAs in individuals in the upper 70s (If for some reason he truly wants to have it checked please order it)

## 2021-11-28 NOTE — Telephone Encounter (Signed)
Spoke with patient and informed him of the labs being ordered. Patient states he wanted the PSA to be done also. Labs ordered.

## 2021-12-03 DIAGNOSIS — E1169 Type 2 diabetes mellitus with other specified complication: Secondary | ICD-10-CM | POA: Diagnosis not present

## 2021-12-03 DIAGNOSIS — Z79899 Other long term (current) drug therapy: Secondary | ICD-10-CM | POA: Diagnosis not present

## 2021-12-03 DIAGNOSIS — E785 Hyperlipidemia, unspecified: Secondary | ICD-10-CM | POA: Diagnosis not present

## 2021-12-03 DIAGNOSIS — R739 Hyperglycemia, unspecified: Secondary | ICD-10-CM | POA: Diagnosis not present

## 2021-12-03 DIAGNOSIS — Z125 Encounter for screening for malignant neoplasm of prostate: Secondary | ICD-10-CM | POA: Diagnosis not present

## 2021-12-04 LAB — HEPATIC FUNCTION PANEL
ALT: 23 IU/L (ref 0–44)
AST: 22 IU/L (ref 0–40)
Albumin: 4.3 g/dL (ref 3.7–4.7)
Alkaline Phosphatase: 105 IU/L (ref 44–121)
Bilirubin Total: 0.8 mg/dL (ref 0.0–1.2)
Bilirubin, Direct: 0.26 mg/dL (ref 0.00–0.40)
Total Protein: 6.3 g/dL (ref 6.0–8.5)

## 2021-12-04 LAB — BASIC METABOLIC PANEL
BUN/Creatinine Ratio: 16 (ref 10–24)
BUN: 15 mg/dL (ref 8–27)
CO2: 22 mmol/L (ref 20–29)
Calcium: 9.4 mg/dL (ref 8.6–10.2)
Chloride: 102 mmol/L (ref 96–106)
Creatinine, Ser: 0.92 mg/dL (ref 0.76–1.27)
Glucose: 132 mg/dL — ABNORMAL HIGH (ref 70–99)
Potassium: 4 mmol/L (ref 3.5–5.2)
Sodium: 141 mmol/L (ref 134–144)
eGFR: 86 mL/min/{1.73_m2} (ref >59)

## 2021-12-04 LAB — LIPID PANEL
Chol/HDL Ratio: 3.1 ratio (ref 0.0–5.0)
Cholesterol, Total: 128 mg/dL (ref 100–199)
HDL: 41 mg/dL (ref >39)
LDL Chol Calc (NIH): 62 mg/dL (ref 0–99)
Triglycerides: 147 mg/dL (ref 0–149)
VLDL Cholesterol Cal: 25 mg/dL (ref 5–40)

## 2021-12-04 LAB — PSA: Prostate Specific Ag, Serum: 1.1 ng/mL (ref 0.0–4.0)

## 2021-12-04 LAB — MICROALBUMIN / CREATININE URINE RATIO
Creatinine, Urine: 61.4 mg/dL
Microalb/Creat Ratio: 36 mg/g creat — ABNORMAL HIGH (ref 0–29)
Microalbumin, Urine: 22.3 ug/mL

## 2021-12-04 LAB — HEMOGLOBIN A1C
Est. average glucose Bld gHb Est-mCnc: 171 mg/dL
Hgb A1c MFr Bld: 7.6 % — ABNORMAL HIGH (ref 4.8–5.6)

## 2021-12-07 ENCOUNTER — Encounter: Payer: Self-pay | Admitting: Family Medicine

## 2021-12-08 ENCOUNTER — Other Ambulatory Visit: Payer: Self-pay

## 2021-12-08 ENCOUNTER — Ambulatory Visit (INDEPENDENT_AMBULATORY_CARE_PROVIDER_SITE_OTHER): Payer: Medicare Other | Admitting: Family Medicine

## 2021-12-08 ENCOUNTER — Encounter: Payer: Self-pay | Admitting: Family Medicine

## 2021-12-08 VITALS — BP 124/68 | HR 86 | Temp 98.6°F | Wt 203.6 lb

## 2021-12-08 DIAGNOSIS — J449 Chronic obstructive pulmonary disease, unspecified: Secondary | ICD-10-CM

## 2021-12-08 DIAGNOSIS — E1169 Type 2 diabetes mellitus with other specified complication: Secondary | ICD-10-CM | POA: Diagnosis not present

## 2021-12-08 DIAGNOSIS — I10 Essential (primary) hypertension: Secondary | ICD-10-CM | POA: Diagnosis not present

## 2021-12-08 DIAGNOSIS — I700 Atherosclerosis of aorta: Secondary | ICD-10-CM

## 2021-12-08 DIAGNOSIS — E785 Hyperlipidemia, unspecified: Secondary | ICD-10-CM | POA: Diagnosis not present

## 2021-12-08 NOTE — Progress Notes (Signed)
Subjective:    Patient ID: Miguel Morales, male    DOB: 10-11-1946, 76 y.o.   MRN: [id number]  Diabetes He presents for his follow-up diabetic visit. He has type 2 diabetes mellitus. There are no hypoglycemic associated symptoms. There are no diabetic associated symptoms. There are no hypoglycemic complications. There are no diabetic complications. Current diabetic treatment includes oral agent (monotherapy). He does not see a podiatrist.Eye exam is current.   Recent labs reviewed  Results for orders placed or performed in visit on 11/27/21  Lipid Profile  Result Value Ref Range   Cholesterol, Total 128 100 - 199 mg/dL   Triglycerides 147 0 - 149 mg/dL   HDL 41 >39 mg/dL   VLDL Cholesterol Cal 25 5 - 40 mg/dL   LDL Chol Calc (NIH) 62 0 - 99 mg/dL   Chol/HDL Ratio 3.1 0.0 - 5.0 ratio  Hepatic function panel  Result Value Ref Range   Total Protein 6.3 6.0 - 8.5 g/dL   Albumin 4.3 3.7 - 4.7 g/dL   Bilirubin Total 0.8 0.0 - 1.2 mg/dL   Bilirubin, Direct 0.26 0.00 - 0.40 mg/dL   Alkaline Phosphatase 105 44 - 121 IU/L   AST 22 0 - 40 IU/L   ALT 23 0 - 44 IU/L  Basic Metabolic Panel (BMET)  Result Value Ref Range   Glucose 132 (H) 70 - 99 mg/dL   BUN 15 8 - 27 mg/dL   Creatinine, Ser 0.92 0.76 - 1.27 mg/dL   eGFR 86 >59 mL/min/1.73   BUN/Creatinine Ratio 16 10 - 24   Sodium 141 134 - 144 mmol/L   Potassium 4.0 3.5 - 5.2 mmol/L   Chloride 102 96 - 106 mmol/L   CO2 22 20 - 29 mmol/L   Calcium 9.4 8.6 - 10.2 mg/dL  Hemoglobin A1C  Result Value Ref Range   Hgb A1c MFr Bld 7.6 (H) 4.8 - 5.6 %   Est. average glucose Bld gHb Est-mCnc 171 mg/dL  Urine Microalbumin w/creat. ratio  Result Value Ref Range   Creatinine, Urine 61.4 Not Estab. mg/dL   Microalbumin, Urine 22.3 Not Estab. ug/mL   Microalb/Creat Ratio 36 (H) 0 - 29 mg/g creat  PSA  Result Value Ref Range   Prostate Specific Ag, Serum 1.1 0.0 - 4.0 ng/mL     Review of Systems     Objective:   Physical  Exam  General-in no acute distress Eyes-no discharge Lungs-respiratory rate normal, CTA CV-no murmurs,RRR Extremities skin warm dry no edema Neuro grossly normal Behavior normal, alert       Assessment & Plan:  This patient comes here several times per year for associated illness plus also follow-up regarding his chronic health issues He is primarily under the care of the [place] when it comes to management of his medications but he does come through [place] as well He states he is trying to watch his diet as best he can Able to do some walking but it somewhat impaired because of his COPD  1. Moderate COPD (chronic obstructive pulmonary disease) (HCC) Decent control no longer smokes utilizing his inhalers.  2. Hyperlipidemia associated with type 2 diabetes mellitus ([place]) Takes his medication on a regular basis cholesterol under good control tolerating medicine well  3. Aortic atherosclerosis (HCC) Keeping LDL below 70.  Healthy diet.  Staying away from smoking.  4. HTN (hypertension), benign Blood pressure very good control continuing current measures.  Diabetes A1c did go up he states that the [place]  is following up again in 4 months he also states that they do his medications.  Have him on metformin currently he is tolerating well.  We did discuss eye examination

## 2021-12-08 NOTE — Telephone Encounter (Signed)
Patient seen in office today. Dr. Nicki Reaper to discuss recent labs

## 2021-12-17 ENCOUNTER — Telehealth: Payer: Self-pay | Admitting: Family Medicine

## 2021-12-17 DIAGNOSIS — E1169 Type 2 diabetes mellitus with other specified complication: Secondary | ICD-10-CM

## 2021-12-17 DIAGNOSIS — I10 Essential (primary) hypertension: Secondary | ICD-10-CM

## 2021-12-17 DIAGNOSIS — Z79899 Other long term (current) drug therapy: Secondary | ICD-10-CM

## 2021-12-17 DIAGNOSIS — R739 Hyperglycemia, unspecified: Secondary | ICD-10-CM

## 2021-12-17 NOTE — Telephone Encounter (Signed)
Patient wanting to know if he needs labs for 6 month follow up in August.

## 2021-12-17 NOTE — Telephone Encounter (Signed)
Pt recently had labs completed 12/03/21-PSA, Urine Micro, A1C, BMET, HEPATIC, LIPID. Please advise. Thank you

## 2021-12-17 NOTE — Telephone Encounter (Signed)
Lipid, liver, metabolic 7, J6C, urine ACR

## 2021-12-17 NOTE — Telephone Encounter (Signed)
Lab orders placed and pt is aware 

## 2022-01-13 ENCOUNTER — Other Ambulatory Visit: Payer: Self-pay

## 2022-01-13 ENCOUNTER — Ambulatory Visit (INDEPENDENT_AMBULATORY_CARE_PROVIDER_SITE_OTHER): Payer: Medicare Other

## 2022-01-13 VITALS — Ht 66.0 in | Wt 196.6 lb

## 2022-01-13 DIAGNOSIS — Z0000 Encounter for general adult medical examination without abnormal findings: Secondary | ICD-10-CM

## 2022-01-13 NOTE — Progress Notes (Cosign Needed)
Subjective:   Miguel Morales is a 76 y.o. male who presents for Medicare Annual/Subsequent preventive examination.  Review of Systems     Cardiac Risk Factors include: advanced age (>19men, >89 women);diabetes mellitus;hypertension;dyslipidemia;male gender;sedentary lifestyle;obesity (BMI >30kg/m2)     Objective:    Today's Vitals   01/13/22 0902  Weight: 196 lb 9.6 oz (89.2 kg)  Height: 5\' 6"  (1.676 m)   Body mass index is 31.73 kg/m.  Advanced Directives 01/13/2022 06/12/2021 06/10/2021 06/04/2021 06/05/2020 10/09/2019 01/02/2019  Does Patient Have a Medical Advance Directive? No No No No Yes No No  Type of Advance Directive - - - - Living will;Healthcare Power of Attorney - -  Does patient want to make changes to medical advance directive? - - No - Patient declined No - Patient declined - - -  Copy of [place] in Chart? - - - - - - -  Would patient like information on creating a medical advance directive? No - Patient declined No - Patient declined No - Patient declined No - Patient declined No - Patient declined - -    Current Medications (verified) Outpatient Encounter Medications as of 01/13/2022  Medication Sig   acetaminophen (TYLENOL) 500 MG tablet Take 500-1,000 mg by mouth daily.   albuterol (PROVENTIL HFA;VENTOLIN HFA) 108 (90 Base) MCG/ACT inhaler Inhale 2 puffs into the lungs every 6 (six) hours as needed for wheezing.   amLODipine (NORVASC) 10 MG tablet TAKE 1 TABLET DAILY (Patient taking differently: Take 10 mg by mouth daily.)   Ascorbic Acid (VITAMIN C) 1000 MG tablet Take 1,000 mg by mouth daily.   aspirin 81 MG tablet Take 81 mg by mouth daily.   atorvastatin (LIPITOR) 80 MG tablet Take 1 tablet (80 mg total) by mouth daily.   azelastine (ASTELIN) 0.1 % nasal spray Place 1 spray into both nostrils 2 (two) times daily. Use in each nostril as directed   Carboxymethylcellulose Sodium (THERATEARS) 0.25 % SOLN Place 1 drop into both eyes daily as needed  (dry eyes).   Cholecalciferol (VITAMIN D) 50 MCG (2000 UT) tablet Take 2,000 Units by mouth daily.   clobetasol (TEMOVATE) 0.05 % external solution Apply 1 application topically every other day.   diclofenac Sodium (VOLTAREN) 1 % GEL Apply 1 application topically daily as needed for pain.   ezetimibe (ZETIA) 10 MG tablet Take 10 mg by mouth daily.   fluticasone (FLONASE) 50 MCG/ACT nasal spray USE 1 SPRAY IN EACH NOSTRIL DAILY AS NEEDED (Patient taking differently: Place 1 spray into both nostrils daily as needed for allergies.)   fluticasone-salmeterol (ADVAIR) 250-50 MCG/ACT AEPB Inhale 1 puff into the lungs in the morning and at bedtime.   gabapentin (NEURONTIN) 100 MG capsule Take 300 mg by mouth at bedtime.   HYDROcodone-acetaminophen (NORCO/VICODIN) 5-325 MG tablet Take by mouth.   hydrOXYzine (ATARAX/VISTARIL) 10 MG tablet Take 10 mg by mouth at bedtime.    ibuprofen (ADVIL,MOTRIN) 200 MG tablet Take 600 mg by mouth daily.   lidocaine (XYLOCAINE) 5 % ointment Apply 1 application topically daily as needed for mild pain (For Back and hip).   magnesium 30 MG tablet Take 30 mg by mouth daily at 6 (six) AM. Over the counter dose   metFORMIN (GLUCOPHAGE) 500 MG tablet Take 500 mg by mouth daily with breakfast.   omeprazole (PRILOSEC) 20 MG capsule TAKE 1 CAPSULE DAILY AS NEEDED (Patient taking differently: Take 20 mg by mouth daily.)   polyethylene glycol-electrolytes (NULYTELY) 420 g solution  As directed   predniSONE (DELTASONE) 20 MG tablet Take 2 tablets (40 mg total) by mouth daily with breakfast.   promethazine-dextromethorphan (PROMETHAZINE-DM) 6.25-15 MG/5ML syrup Take 5 mLs by mouth 4 (four) times daily as needed for cough.   tiotropium (SPIRIVA HANDIHALER) 18 MCG inhalation capsule Place 1 capsule (18 mcg total) into inhaler and inhale daily.   zinc gluconate 50 MG tablet Take 50 mg by mouth daily.   No facility-administered encounter medications on file as of 01/13/2022.    Allergies  (verified) Adhesive [tape] and Sulfa antibiotics   History: Past Medical History:  Diagnosis Date   Aortic atherosclerosis ([place]) 01/02/2020   Seen on CT exam February 2021   Arthritis    Asthma    Cancer ([place])    skin,lung   COPD (chronic obstructive pulmonary disease) (HCC)    GERD (gastroesophageal reflux disease)    History of bronchitis    Hypertension    Mixed hyperlipidemia    Nocturia    Shortness of breath dyspnea    with exertion   Sleep apnea    will sometimes wear a CPAP   Spinal stenosis    Past Surgical History:  Procedure Laterality Date   CIRCUMCISION     COLONOSCOPY  03/04/2004   [id number] diverticula.  The remainder of the colonic mucosa appeared normal/normal rectum   COLONOSCOPY  05/22/2009   [id number] mucosa appeared normal/Left-sided diverticula/Normal rectum   COLONOSCOPY N/A 08/02/2014   Procedure: COLONOSCOPY;  Surgeon: Daneil Dolin, MD;  [facility];  Service: Endoscopy;  Laterality: N/A;  2:00 PM   COLONOSCOPY WITH PROPOFOL N/A 06/12/2021   Procedure: COLONOSCOPY WITH PROPOFOL;  Surgeon: Daneil Dolin, MD;  [facility];  Service: Endoscopy;  Laterality: N/A;  11:00am   KNEE ARTHROSCOPY Right 2004   LYMPH NODE DISSECTION N/A 04/08/2016   Procedure: LYMPH NODE DISSECTION;  Surgeon: Melrose Nakayama, MD;  Location: [facility];  Service: Thoracic;  Laterality: N/A;   SEPTOPLASTY     VIDEO ASSISTED THORACOSCOPY (VATS)/WEDGE RESECTION Left 04/08/2016   Procedure: VIDEO ASSISTED THORACOSCOPY (VATS)/WEDGE RESECTION;  Surgeon: Melrose Nakayama, MD;  Location: [facility];  Service: Thoracic;  Laterality: Left;   VIDEO BRONCHOSCOPY WITH ENDOBRONCHIAL NAVIGATION N/A 02/26/2016   Procedure: VIDEO BRONCHOSCOPY WITH ENDOBRONCHIAL NAVIGATION LEFT UPPER LOBE LUNG NODULE;  Surgeon: [name], MD;  Location: [facility];  Service: Thoracic;  Laterality: N/A;   Family History  Problem Relation Age of Onset   Cancer Mother    Social  History   Socioeconomic History   Marital status: Married    Spouse name: [name]   Number of children: 2   Years of education: Not on file   Highest education level: Not on file  Occupational History   Not on file  Tobacco Use   Smoking status: Former    Packs/day: 1.00    Years: 30.00    Pack years: 30.00    Types: Cigarettes    Start date: 11/09/1958    Quit date: 11/10/2003    Years since quitting: 18.1   Smokeless tobacco: Never  Vaping Use   Vaping Use: Never used  Substance and Sexual Activity   Alcohol use: Yes    Alcohol/week: 3.0 standard drinks    Types: 3 Glasses of wine per week    Comment: Occasionally   Drug use: No   Sexual activity: Yes  Other Topics Concern   Not on file  Social History Narrative   Married  x 12 years 03/2022.    2 children living, 1 deceased.    3 step children   7 grandchildren   7 great grandchildren   Social Determinants of Radio broadcast assistant Strain: Low Risk    Difficulty of Paying Living Expenses: Not hard at all  Food Insecurity: No Food Insecurity   Worried About Charity fundraiser in the Last Year: Never true   Arboriculturist in the Last Year: Never true  Transportation Needs: No Transportation Needs   Lack of Transportation (Medical): No   Lack of Transportation (Non-Medical): No  Physical Activity: Insufficiently Active   Days of Exercise per Week: 3 days   Minutes of Exercise per Session: 20 min  Stress: No Stress Concern Present   Feeling of Stress : Only a little  Social Connections: Unknown   Frequency of Communication with Friends and Family: More than three times a week   Frequency of Social Gatherings with Friends and Family: More than three times a week   Attends Religious Services: More than 4 times per year   Active Member of [employer] or Organizations: Yes   Attends Music therapist: More than 4 times per year   Marital Status: Not on file    Tobacco Counseling Counseling given: Not  Answered   Clinical Intake:  Pre-visit preparation completed: Yes  Pain : No/denies pain     BMI - recorded: 31.73 Nutritional Status: BMI > 30  Obese Nutritional Risks: None Diabetes: Yes  How often do you need to have someone help you when you read instructions, pamphlets, or other written materials from your doctor or pharmacy?: 1 - Never  Diabetic?Nutrition Risk Assessment:  Has the patient had any N/V/D within the last 2 months?  No  Does the patient have any non-healing wounds?  No  Has the patient had any unintentional weight loss or weight gain?  No   Diabetes:  Is the patient diabetic?  Yes  If diabetic, was a CBG obtained today?  No  Did the patient bring in their glucometer from home?  No  How often do you monitor your CBG's?Weekly.   Financial Strains and Diabetes Management:  Are you having any financial strains with the device, your supplies or your medication? No .  Does the patient want to be seen by Chronic Care Management for management of their diabetes?  No  Would the patient like to be referred to a Nutritionist or for Diabetic Management?  No   Diabetic Exams:  Diabetic Eye Exam: Completed 2022.Pt has been advised about the importance in completing this exam.  Diabetic Foot Exam: Completed 02/27/2021. Pt has been advised about the importance in completing this exam.   Interpreter Needed?: No  Information entered by :: mj Jarmon Javid, lpn   Activities of Daily Living In your present state of health, do you have any difficulty performing the following activities: 01/13/2022 01/12/2022  Hearing? Y Y  Comment - -  Vision? N N  Difficulty concentrating or making decisions? Y N  Comment memory at times -  Walking or climbing stairs? N N  Dressing or bathing? N N  Doing errands, shopping? N N  Preparing Food and eating ? N N  Using the Toilet? N N  In the past six months, have you accidently leaked urine? N N  Do you have problems with loss of bowel  control? N N  Managing your Medications? N N  Managing your Finances? N  N  Housekeeping or managing your Housekeeping? N N  Some recent data might be hidden    Patient Care Team: [name], MD as PCP - General (Family Medicine)  Indicate any recent Medical Services you may have received from other than [facility] providers in the past year (date may be approximate).     Assessment:   This is a routine wellness examination for [place].  Hearing/Vision screen Hearing Screening - Comments:: Wears hearing aids.  Vision Screening - Comments:: Glasses. [place] 2022  Dietary issues and exercise activities discussed: Current Exercise Habits: Home exercise routine, Type of exercise: walking, Time (Minutes): 30, Frequency (Times/Week): 3, Weekly Exercise (Minutes/Week): 90, Intensity: Mild, Exercise limited by: cardiac condition(s);orthopedic condition(s);respiratory conditions(s)   Goals Addressed             This Visit's Progress    Exercise 3x per week (30 min per time)       Continue to exercise, eat healthy and spend time with grandkids       Depression Screen PHQ 2/9 Scores 01/13/2022 02/27/2021 10/01/2020 05/01/2020 01/10/2018 12/28/2016 12/09/2016  PHQ - 2 Score 0 0 0 0 0 0 0  PHQ- 9 Score - - - - - - 4  Exception Documentation - - - - - - -    Fall Risk Fall Risk  01/13/2022 01/12/2022 02/27/2021 10/01/2020 05/01/2020  Falls in the past year? 1 1 0 0 0  Comment - - - - -  Number falls in past yr: 0 0 0 - 0  Comment - - - - -  Injury with Fall? 0 0 0 - 0  Risk for fall due to : Impaired balance/gait;History of fall(s) - - - -  Risk for fall due to: Comment - - - - -  Follow up Falls prevention discussed - Falls evaluation completed Falls evaluation completed Falls evaluation completed    [place]:  Any stairs in or around the home? Yes  If so, are there any without handrails? No  Home free of loose throw rugs in walkways, pet beds,  electrical cords, etc? Yes  Adequate lighting in your home to reduce risk of falls? Yes   ASSISTIVE DEVICES UTILIZED TO PREVENT FALLS:  Life alert? No  Use of a cane, walker or w/c? No  Grab bars in the bathroom? No  Shower chair or bench in shower? Yes  Elevated toilet seat or a handicapped toilet? Yes   TIMED UP AND GO:  Was the test performed? Yes .  Length of time to ambulate 10 feet: 10 sec.   Gait steady and fast without use of assistive device  Cognitive Function:     6CIT Screen 01/13/2022  What Year? 0 points  What month? 0 points  What time? 0 points  Count back from 20 0 points  Months in reverse 0 points  Repeat phrase 2 points  Total Score 2    Immunizations Immunization History  Administered Date(s) Administered   Fluad Quad(high Dose 65+) 10/02/2020   Influenza Split 08/20/2009, 07/10/2018   Influenza, High Dose Seasonal PF 07/21/2016, 07/14/2017, 09/04/2019   Influenza,inj,Quad PF,6+ Mos 07/26/2018   Influenza-Unspecified 08/07/2014, 07/23/2015, 08/23/2021   Janssen (J&J) SARS-COV-2 Vaccination 01/21/2020   Pneumococcal Conjugate-13 05/09/2014   Pneumococcal Polysaccharide-23 11/10/2011   Td 02/02/2001, 11/09/2010   Zoster, Live 12/14/2012    TDAP status: Due, Education has been provided regarding the importance of this vaccine. Advised may receive this vaccine at local  pharmacy or Health Dept. Aware to provide a copy of the vaccination record if obtained from local pharmacy or Health Dept. Verbalized acceptance and understanding.  Flu Vaccine status: Up to date  Pneumococcal vaccine status: Up to date  Covid-19 vaccine status: Completed vaccines  Qualifies for Shingles Vaccine? Yes   Zostavax completed Yes   Shingrix Completed?: No.    Education has been provided regarding the importance of this vaccine. Patient has been advised to call insurance company to determine out of pocket expense if they have not yet received this vaccine. Advised may  also receive vaccine at local pharmacy or Health Dept. Verbalized acceptance and understanding.  Screening Tests Health Maintenance  Topic Date Due   Zoster Vaccines- Shingrix (1 of 2) Never done   OPHTHALMOLOGY EXAM  12/29/2018   COVID-19 Vaccine (2 - Janssen risk series) 02/18/2020   TETANUS/TDAP  11/09/2020   FOOT EXAM  02/27/2022   HEMOGLOBIN A1C  06/02/2022   URINE MICROALBUMIN  12/03/2022   Pneumonia Vaccine 18+ Years old  Completed   INFLUENZA VACCINE  Completed   Hepatitis C Screening  Completed   HPV VACCINES  Aged Out   COLONOSCOPY (Pts 45-33yrs Insurance coverage will need to be confirmed)  Discontinued    Health Maintenance  Health Maintenance Due  Topic Date Due   Zoster Vaccines- Shingrix (1 of 2) Never done   OPHTHALMOLOGY EXAM  12/29/2018   COVID-19 Vaccine (2 - Janssen risk series) 02/18/2020   TETANUS/TDAP  11/09/2020    Colorectal cancer screening: Type of screening: Colonoscopy. Completed 06/12/2021. Repeat every No longer required. years  Lung Cancer Screening: (Low Dose CT Chest recommended if Age 42-80 years, 30 pack-year currently smoking OR have quit w/in 15years.) does not qualify.    Additional Screening:  Hepatitis C Screening: does qualify; Completed 11/08/2018  Vision Screening: Recommended annual ophthalmology exams for early detection of glaucoma and other disorders of the eye. Is the patient up to date with their annual eye exam?  Yes  Who is the provider or what is the name of the office in which the patient attends annual eye exams? [facility] If pt is not established with a provider, would they like to be referred to a provider to establish care? No .   Dental Screening: Recommended annual dental exams for proper oral hygiene  Community Resource Referral / Chronic Care Management: CRR required this visit?  No   CCM required this visit?  No      Plan:     I have personally reviewed and noted the following in the patients  chart:   Medical and social history Use of alcohol, tobacco or illicit drugs  Current medications and supplements including opioid prescriptions. Patient is not currently taking opioid prescriptions. Functional ability and status Nutritional status Physical activity Advanced directives List of other physicians Hospitalizations, surgeries, and ER visits in previous 12 months Vitals Screenings to include cognitive, depression, and falls Referrals and appointments  In addition, I have reviewed and discussed with patient certain preventive protocols, quality metrics, and best practice recommendations. A written personalized care plan for preventive services as well as general preventive health recommendations were provided to patient.     Chriss Driver, LPN   03/16/3219   Nurse Notes: Pt is up to date on health maintenance. Discussed Shingrix and how to obtain.

## 2022-01-13 NOTE — Patient Instructions (Signed)
Miguel Morales , Thank you for taking time to come for your Medicare Wellness Visit. I appreciate your ongoing commitment to your health goals. Please review the following plan we discussed and let me know if I can assist you in the future.   Screening recommendations/referrals: Colonoscopy: Done 06/12/2021  Recommended yearly ophthalmology/optometry visit for glaucoma screening and checkup Recommended yearly dental visit for hygiene and checkup  Vaccinations: Influenza vaccine: Done 08/23/2021 Repeat annually  Pneumococcal vaccine: Done 11/10/2011 and 05/23/2014 Tdap vaccine: Done 11/09/2010 Repeat in 10 years  Shingles vaccine: Discussed.   Covid-19: Done 01/21/2020   Advanced directives: Advance directive discussed with you today. Even though you declined this today, please call our office should you change your mind, and we can give you the proper paperwork for you to fill out.   Conditions/risks identified: Aim for 30 minutes of exercise or brisk walking, 6-8 glasses of water, and 5 servings of fruits and vegetables each day.   Next appointment: Follow up in one year for your annual wellness visit. 2024.  Preventive Care 22 Years and Older, Male  Preventive care refers to lifestyle choices and visits with your health care provider that can promote health and wellness. What does preventive care include? A yearly physical exam. This is also called an annual well check. Dental exams once or twice a year. Routine eye exams. Ask your health care provider how often you should have your eyes checked. Personal lifestyle choices, including: Daily care of your teeth and gums. Regular physical activity. Eating a healthy diet. Avoiding tobacco and drug use. Limiting alcohol use. Practicing safe sex. Taking low doses of aspirin every day. Taking vitamin and mineral supplements as recommended by your health care provider. What happens during an annual well check? The services and screenings done  by your health care provider during your annual well check will depend on your age, overall health, lifestyle risk factors, and family history of disease. Counseling  Your health care provider may ask you questions about your: Alcohol use. Tobacco use. Drug use. Emotional well-being. Home and relationship well-being. Sexual activity. Eating habits. History of falls. Memory and ability to understand (cognition). Work and work Statistician. Screening  You may have the following tests or measurements: Height, weight, and BMI. Blood pressure. Lipid and cholesterol levels. These may be checked every 5 years, or more frequently if you are over 15 years old. Skin check. Lung cancer screening. You may have this screening every year starting at age 58 if you have a 30-pack-year history of smoking and currently smoke or have quit within the past 15 years. Fecal occult blood test (FOBT) of the stool. You may have this test every year starting at age 53. Flexible sigmoidoscopy or colonoscopy. You may have a sigmoidoscopy every 5 years or a colonoscopy every 10 years starting at age 24. Prostate cancer screening. Recommendations will vary depending on your family history and other risks. Hepatitis C blood test. Hepatitis B blood test. Sexually transmitted disease (STD) testing. Diabetes screening. This is done by checking your blood sugar (glucose) after you have not eaten for a while (fasting). You may have this done every 1-3 years. Abdominal aortic aneurysm (AAA) screening. You may need this if you are a current or former smoker. Osteoporosis. You may be screened starting at age 77 if you are at high risk. Talk with your health care provider about your test results, treatment options, and if necessary, the need for more tests. Vaccines  Your health care provider  may recommend certain vaccines, such as: Influenza vaccine. This is recommended every year. Tetanus, diphtheria, and acellular  pertussis (Tdap, Td) vaccine. You may need a Td booster every 10 years. Zoster vaccine. You may need this after age 62. Pneumococcal 13-valent conjugate (PCV13) vaccine. One dose is recommended after age 25. Pneumococcal polysaccharide (PPSV23) vaccine. One dose is recommended after age 60. Talk to your health care provider about which screenings and vaccines you need and how often you need them. This information is not intended to replace advice given to you by your health care provider. Make sure you discuss any questions you have with your health care provider. Document Released: 11/22/2015 Document Revised: 07/15/2016 Document Reviewed: 08/27/2015 Elsevier Interactive Patient Education  2017 [employer] Prevention in the Home Falls can cause injuries. They can happen to people of all ages. There are many things you can do to make your home safe and to help prevent falls. What can I do on the outside of my home? Regularly fix the edges of walkways and driveways and fix any cracks. Remove anything that might make you trip as you walk through a door, such as a raised step or threshold. Trim any bushes or trees on the path to your home. Use bright outdoor lighting. Clear any walking paths of anything that might make someone trip, such as rocks or tools. Regularly check to see if handrails are loose or broken. Make sure that both sides of any steps have handrails. Any raised decks and porches should have guardrails on the edges. Have any leaves, snow, or ice cleared regularly. Use sand or salt on walking paths during winter. Clean up any spills in your garage right away. This includes oil or grease spills. What can I do in the bathroom? Use night lights. Install grab bars by the toilet and in the tub and shower. Do not use towel bars as grab bars. Use non-skid mats or decals in the tub or shower. If you need to sit down in the shower, use a plastic, non-slip stool. Keep the floor  dry. Clean up any water that spills on the floor as soon as it happens. Remove soap buildup in the tub or shower regularly. Attach bath mats securely with double-sided non-slip rug tape. Do not have throw rugs and other things on the floor that can make you trip. What can I do in the bedroom? Use night lights. Make sure that you have a light by your bed that is easy to reach. Do not use any sheets or blankets that are too big for your bed. They should not hang down onto the floor. Have a firm chair that has side arms. You can use this for support while you get dressed. Do not have throw rugs and other things on the floor that can make you trip. What can I do in the kitchen? Clean up any spills right away. Avoid walking on wet floors. Keep items that you use a lot in easy-to-reach places. If you need to reach something above you, use a strong step stool that has a grab bar. Keep electrical cords out of the way. Do not use floor polish or wax that makes floors slippery. If you must use wax, use non-skid floor wax. Do not have throw rugs and other things on the floor that can make you trip. What can I do with my stairs? Do not leave any items on the stairs. Make sure that there are handrails on both sides  of the stairs and use them. Fix handrails that are broken or loose. Make sure that handrails are as long as the stairways. Check any carpeting to make sure that it is firmly attached to the stairs. Fix any carpet that is loose or worn. Avoid having throw rugs at the top or bottom of the stairs. If you do have throw rugs, attach them to the floor with carpet tape. Make sure that you have a light switch at the top of the stairs and the bottom of the stairs. If you do not have them, ask someone to add them for you. What else can I do to help prevent falls? Wear shoes that: Do not have high heels. Have rubber bottoms. Are comfortable and fit you well. Are closed at the toe. Do not wear  sandals. If you use a stepladder: Make sure that it is fully opened. Do not climb a closed stepladder. Make sure that both sides of the stepladder are locked into place. Ask someone to hold it for you, if possible. Clearly mark and make sure that you can see: Any grab bars or handrails. First and last steps. Where the edge of each step is. Use tools that help you move around (mobility aids) if they are needed. These include: Canes. Walkers. Scooters. Crutches. Turn on the lights when you go into a dark area. Replace any light bulbs as soon as they burn out. Set up your furniture so you have a clear path. Avoid moving your furniture around. If any of your floors are uneven, fix them. If there are any pets around you, be aware of where they are. Review your medicines with your doctor. Some medicines can make you feel dizzy. This can increase your chance of falling. Ask your doctor what other things that you can do to help prevent falls. This information is not intended to replace advice given to you by your health care provider. Make sure you discuss any questions you have with your health care provider. Document Released: 08/22/2009 Document Revised: 04/02/2016 Document Reviewed: 11/30/2014 Elsevier Interactive Patient Education  2017 [employer].

## 2022-01-16 ENCOUNTER — Telehealth: Payer: Medicare Other | Admitting: Family

## 2022-01-16 DIAGNOSIS — J441 Chronic obstructive pulmonary disease with (acute) exacerbation: Secondary | ICD-10-CM

## 2022-01-16 MED ORDER — DOXYCYCLINE HYCLATE 100 MG PO TABS: 100.0000 mg | ORAL_TABLET | Freq: Two times a day (BID) | ORAL | 0 refills | Status: DC

## 2022-01-16 MED ORDER — BENZONATATE 100 MG PO CAPS: 100.0000 mg | ORAL_CAPSULE | Freq: Three times a day (TID) | ORAL | 0 refills | Status: DC | PRN

## 2022-01-16 NOTE — Progress Notes (Signed)

## 2022-03-31 ENCOUNTER — Encounter: Payer: Self-pay | Admitting: Emergency Medicine

## 2022-03-31 ENCOUNTER — Ambulatory Visit
Admission: EM | Admit: 2022-03-31 | Discharge: 2022-03-31 | Disposition: A | Discharge status: Home / Self Care | Payer: Medicare Other | Attending: Nurse Practitioner | Admitting: Nurse Practitioner

## 2022-03-31 ENCOUNTER — Other Ambulatory Visit: Payer: Self-pay

## 2022-03-31 DIAGNOSIS — J0100 Acute maxillary sinusitis, unspecified: Secondary | ICD-10-CM | POA: Diagnosis not present

## 2022-03-31 MED ORDER — AMOXICILLIN-POT CLAVULANATE 875-125 MG PO TABS: 1.0000 | ORAL_TABLET | Freq: Two times a day (BID) | ORAL | 0 refills | Status: DC
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
## 2022-03-31 NOTE — ED Triage Notes (Addendum)
Pt reports cough,sore throat, headache, and elevated blood pressure x1 week. Pt reports takes allergy medication daily with minimal change in symptoms.

## 2022-03-31 NOTE — Discharge Instructions (Addendum)
Take medication as prescribed.  Continue using your loratidine and Flonase nasal spray at this time. Purchase over-the-counter Coricidin HBP to take as needed for your cough. Recommend using a humidifier at bedtime during sleep. Increase fluids and allow for plenty of rest. May continue ibuprofen or Tylenol for pain, fever, or general discomfort. Recommend over-the-counter normal saline nasal spray to help with your nasal congestion. If your symptoms worsen or do not improve, follow-up in our clinic or with your primary care physician.

## 2022-04-08 ENCOUNTER — Telehealth: Payer: Self-pay

## 2022-04-08 NOTE — Telephone Encounter (Signed)
Call back number:[phone]   Provider they see: Luking    Reason for call:Pt dropping off form to be completed for disability for parking. Also pt was in the office one morning when he woke up his blood pressure was 151/98 and again one morning 171/98 all he did was get out of bed and is concerned. Pt is wanting advice that has happen twice. Pt has been keeping eye on blood pressure yesterday during the day it was 137/79 at [facility]   Please advise

## 2022-04-08 NOTE — Telephone Encounter (Signed)
Caller name:Yancarlos Alleen Borne)   On DPR? :Yes  Call back number:[phone]  Provider they see: Luking   Reason for call:Pt dropping off form to be completed for disability for parking. Also pt was in the office one morning when he woke up his blood pressure was 151/98 and again one morning 171/98 all he did was get out of bed and is concerned. Pt is wanting advice that has happen twice. Pt has been keeping eye on blood pressure yesterday during the day it was 137/79 at [facility]

## 2022-04-09 NOTE — Telephone Encounter (Signed)
Patient has been informed per drs notes and recommendations, awaiting disability parking form.

## 2022-04-09 NOTE — Telephone Encounter (Signed)
I would recommend moving up his appointment Perhaps we can see him next week? He would need to bring his cuff with him We need to validate to see if it is accurate Some people can have what is referred to his labile hypertension where blood pressures can periodically be elevated but at other times they are perfectly normal We can discuss this further at follow-up visit

## 2022-04-22 DIAGNOSIS — E785 Hyperlipidemia, unspecified: Secondary | ICD-10-CM | POA: Diagnosis not present

## 2022-04-22 DIAGNOSIS — R739 Hyperglycemia, unspecified: Secondary | ICD-10-CM | POA: Diagnosis not present

## 2022-04-22 DIAGNOSIS — E1169 Type 2 diabetes mellitus with other specified complication: Secondary | ICD-10-CM | POA: Diagnosis not present

## 2022-04-22 DIAGNOSIS — I10 Essential (primary) hypertension: Secondary | ICD-10-CM | POA: Diagnosis not present

## 2022-04-22 DIAGNOSIS — Z79899 Other long term (current) drug therapy: Secondary | ICD-10-CM | POA: Diagnosis not present

## 2022-04-23 LAB — BASIC METABOLIC PANEL
BUN/Creatinine Ratio: 17 (ref 10–24)
BUN: 16 mg/dL (ref 8–27)
CO2: 23 mmol/L (ref 20–29)
Calcium: 9.6 mg/dL (ref 8.6–10.2)
Chloride: 101 mmol/L (ref 96–106)
Creatinine, Ser: 0.92 mg/dL (ref 0.76–1.27)
Glucose: 116 mg/dL — ABNORMAL HIGH (ref 70–99)
Potassium: 4 mmol/L (ref 3.5–5.2)
Sodium: 141 mmol/L (ref 134–144)
eGFR: 85 mL/min/{1.73_m2} (ref >59)

## 2022-04-23 LAB — HEMOGLOBIN A1C
Est. average glucose Bld gHb Est-mCnc: 140 mg/dL
Hgb A1c MFr Bld: 6.5 % — ABNORMAL HIGH (ref 4.8–5.6)

## 2022-04-23 LAB — HEPATIC FUNCTION PANEL
ALT: 20 IU/L (ref 0–44)
AST: 22 IU/L (ref 0–40)
Albumin: 4.5 g/dL (ref 3.7–4.7)
Alkaline Phosphatase: 117 IU/L (ref 44–121)
Bilirubin Total: 0.6 mg/dL (ref 0.0–1.2)
Bilirubin, Direct: 0.17 mg/dL (ref 0.00–0.40)
Total Protein: 6.7 g/dL (ref 6.0–8.5)

## 2022-04-23 LAB — MICROALBUMIN / CREATININE URINE RATIO
Creatinine, Urine: 50.7 mg/dL
Microalb/Creat Ratio: 46 mg/g creat — ABNORMAL HIGH (ref 0–29)
Microalbumin, Urine: 23.1 ug/mL

## 2022-04-23 LAB — LIPID PANEL
Chol/HDL Ratio: 3.3 ratio (ref 0.0–5.0)
Cholesterol, Total: 131 mg/dL (ref 100–199)
HDL: 40 mg/dL (ref >39)
LDL Chol Calc (NIH): 72 mg/dL (ref 0–99)
Triglycerides: 99 mg/dL (ref 0–149)
VLDL Cholesterol Cal: 19 mg/dL (ref 5–40)

## 2022-04-27 ENCOUNTER — Ambulatory Visit (INDEPENDENT_AMBULATORY_CARE_PROVIDER_SITE_OTHER): Payer: Medicare Other | Admitting: Family Medicine

## 2022-04-27 VITALS — BP 116/72 | HR 80 | Temp 97.9°F | Ht 66.0 in | Wt 192.6 lb

## 2022-04-27 DIAGNOSIS — I10 Essential (primary) hypertension: Secondary | ICD-10-CM | POA: Diagnosis not present

## 2022-04-27 NOTE — Progress Notes (Signed)
   Subjective:    Patient ID: Miguel Morales, male    DOB: July 13, 1946, 76 y.o.   MRN: [id number]  HPI  Patient here for follow up on blood pressure. Please see to the previous telephone message Patient's blood pressure intermittently elevated in the morning time Generally doing well later in the day Tries to eat healthy He is working on his weight Unable to do a lot of walking because of pulmonary issues Review of Systems     Objective:   Physical Exam General-in no acute distress Eyes-no discharge Lungs-respiratory rate normal, CTA CV-no murmurs,RRR Extremities skin warm dry no edema Neuro grossly normal Behavior normal, alert        Assessment & Plan:  COPD stable Encourage patient healthy eating regular activity portion control keep weight down Blood pressure under decent control no need to adjust upward he will bring his machine in to check calibration

## 2022-04-29 ENCOUNTER — Ambulatory Visit (INDEPENDENT_AMBULATORY_CARE_PROVIDER_SITE_OTHER): Payer: Medicare Other | Admitting: Family Medicine

## 2022-04-29 VITALS — BP 136/74 | HR 94 | Ht 66.0 in | Wt 194.8 lb

## 2022-04-29 DIAGNOSIS — I10 Essential (primary) hypertension: Secondary | ICD-10-CM

## 2022-04-29 NOTE — Patient Instructions (Addendum)
Your machine 146/87  Our BP 136/74  Check your blood pressures periodically Always sitting for at least 5 minutes Not within 30 to 45 minutes of caffeine  Send me some blood pressure readings intermittently for our review  Please follow-up later in the fall such as late October or November  Sooner if any issues  TakeCare-Tranae Lar[place]ie

## 2022-04-29 NOTE — Progress Notes (Signed)
   Subjective:    Patient ID: Miguel Morales, male    DOB: [birth year], 76 y.o.   MRN: [id number]  HPI  Patient here for follow up on blood pressure. Patient had a couple different blood pressures that were moderately elevated previously he brings his cuff in today to check against ours to see how accurate Review of Systems     Objective:   Physical Exam His machine 146/86 Wall unit mercury 136/74  We just saw the patient a few days ago for exam he was unable to bring his cuff at that time this is a courtesy visit     Assessment & Plan:  Blood pressure good control  His machine reads about 10 points higher Healthy diet regular activity recommended Follow-up by November or December

## 2022-05-06 ENCOUNTER — Other Ambulatory Visit: Payer: Self-pay | Admitting: Physical Medicine and Rehabilitation

## 2022-05-06 ENCOUNTER — Other Ambulatory Visit (HOSPITAL_COMMUNITY): Payer: Self-pay | Admitting: Physical Medicine and Rehabilitation

## 2022-05-06 DIAGNOSIS — M5440 Lumbago with sciatica, unspecified side: Secondary | ICD-10-CM

## 2022-05-06 DIAGNOSIS — M79606 Pain in leg, unspecified: Secondary | ICD-10-CM

## 2022-05-06 DIAGNOSIS — R29818 Other symptoms and signs involving the nervous system: Secondary | ICD-10-CM

## 2022-05-19 ENCOUNTER — Ambulatory Visit (HOSPITAL_COMMUNITY)
Admission: RE | Admit: 2022-05-19 | Discharge: 2022-05-19 | Disposition: A | Discharge status: Home / Self Care | Payer: No Typology Code available for payment source | Source: Ambulatory Visit | Attending: Physical Medicine and Rehabilitation | Admitting: Physical Medicine and Rehabilitation

## 2022-05-19 DIAGNOSIS — M79606 Pain in leg, unspecified: Secondary | ICD-10-CM | POA: Insufficient documentation

## 2022-05-19 DIAGNOSIS — R29818 Other symptoms and signs involving the nervous system: Secondary | ICD-10-CM | POA: Insufficient documentation

## 2022-05-19 DIAGNOSIS — M5440 Lumbago with sciatica, unspecified side: Secondary | ICD-10-CM | POA: Diagnosis not present

## 2022-06-02 ENCOUNTER — Inpatient Hospital Stay: Payer: Medicare Other | Attending: Internal Medicine

## 2022-06-02 ENCOUNTER — Ambulatory Visit (HOSPITAL_COMMUNITY)
Admission: RE | Admit: 2022-06-02 | Discharge: 2022-06-02 | Disposition: A | Discharge status: Home / Self Care | Payer: Medicare Other | Source: Ambulatory Visit | Attending: Internal Medicine | Admitting: Internal Medicine

## 2022-06-02 ENCOUNTER — Other Ambulatory Visit: Payer: Self-pay

## 2022-06-02 DIAGNOSIS — Z85118 Personal history of other malignant neoplasm of bronchus and lung: Secondary | ICD-10-CM | POA: Insufficient documentation

## 2022-06-02 DIAGNOSIS — C3490 Malignant neoplasm of unspecified part of unspecified bronchus or lung: Secondary | ICD-10-CM | POA: Insufficient documentation

## 2022-06-02 DIAGNOSIS — Z79899 Other long term (current) drug therapy: Secondary | ICD-10-CM | POA: Insufficient documentation

## 2022-06-02 DIAGNOSIS — R911 Solitary pulmonary nodule: Secondary | ICD-10-CM | POA: Diagnosis not present

## 2022-06-02 LAB — CMP (CANCER CENTER ONLY)
ALT: 18 U/L (ref 0–44)
AST: 19 U/L (ref 15–41)
Albumin: 4.2 g/dL (ref 3.5–5.0)
Alkaline Phosphatase: 99 U/L (ref 38–126)
Anion gap: 8 (ref 5–15)
BUN: 17 mg/dL (ref 8–23)
CO2: 27 mmol/L (ref 22–32)
Calcium: 9.2 mg/dL (ref 8.9–10.3)
Chloride: 106 mmol/L (ref 98–111)
Creatinine: 1.01 mg/dL (ref 0.61–1.24)
GFR, Estimated: >60 mL/min (ref >60)
Glucose, Bld: 107 mg/dL — ABNORMAL HIGH (ref 70–99)
Potassium: 3.9 mmol/L (ref 3.5–5.1)
Sodium: 141 mmol/L (ref 135–145)
Total Bilirubin: 0.6 mg/dL (ref 0.3–1.2)
Total Protein: 6.9 g/dL (ref 6.5–8.1)

## 2022-06-02 LAB — CBC WITH DIFFERENTIAL (CANCER CENTER ONLY)
Abs Immature Granulocytes: 0.05 10*3/uL (ref 0.00–0.07)
Basophils Absolute: 0.1 10*3/uL (ref 0.0–0.1)
Basophils Relative: 1 %
Eosinophils Absolute: 0.2 10*3/uL (ref 0.0–0.5)
Eosinophils Relative: 2 %
HCT: 40.9 % (ref 39.0–52.0)
Hemoglobin: 13.9 g/dL (ref 13.0–17.0)
Immature Granulocytes: 1 %
Lymphocytes Relative: 20 %
Lymphs Abs: 1.5 10*3/uL (ref 0.7–4.0)
MCH: 30.8 pg (ref 26.0–34.0)
MCHC: 34 g/dL (ref 30.0–36.0)
MCV: 90.5 fL (ref 80.0–100.0)
Monocytes Absolute: 0.7 10*3/uL (ref 0.1–1.0)
Monocytes Relative: 10 %
Neutro Abs: 5.1 10*3/uL (ref 1.7–7.7)
Neutrophils Relative %: 66 %
Platelet Count: 287 10*3/uL (ref 150–400)
RBC: 4.52 MIL/uL (ref 4.22–5.81)
RDW: 13.8 % (ref 11.5–15.5)
WBC Count: 7.7 10*3/uL (ref 4.0–10.5)
nRBC: 0 % (ref 0.0–0.2)

## 2022-06-02 MED ORDER — IOHEXOL 300 MG/ML  SOLN
75.0000 mL | Freq: Once | INTRAMUSCULAR | Status: AC | PRN
  Administered 2022-06-02: 75 mL via INTRAVENOUS

## 2022-06-04 ENCOUNTER — Inpatient Hospital Stay (HOSPITAL_BASED_OUTPATIENT_CLINIC_OR_DEPARTMENT_OTHER): Payer: Medicare Other | Admitting: Internal Medicine

## 2022-06-04 VITALS — BP 138/80 | HR 74 | Temp 98.6°F | Resp 15 | Wt 198.2 lb

## 2022-06-04 DIAGNOSIS — Z79899 Other long term (current) drug therapy: Secondary | ICD-10-CM | POA: Diagnosis not present

## 2022-06-04 DIAGNOSIS — C3490 Malignant neoplasm of unspecified part of unspecified bronchus or lung: Secondary | ICD-10-CM | POA: Diagnosis not present

## 2022-06-04 DIAGNOSIS — Z85118 Personal history of other malignant neoplasm of bronchus and lung: Secondary | ICD-10-CM | POA: Diagnosis not present

## 2022-06-04 NOTE — Progress Notes (Signed)
[name] [phone]   [phone]  OFFICE PROGRESS NOTE  Miguel Drown, MD [place] [address]  DIAGNOSIS: Stage IA (T1a, N0, M0) non-small cell lung cancer, adenocarcinoma with negative EGFR, ALK, ROS 1 and BRAF mutations diagnosed in May 2017. PDL 1 expression is 10%  PRIOR THERAPY: Status post left upper lobe lingular segmentectomy with lymph node dissection under the care of Dr. Roxan Hockey on 04/08/2016.  CURRENT THERAPY: Observation.  INTERVAL HISTORY: Miguel Morales 76 y.o. male returns to the clinic today for follow-up visit.  The patient is feeling fine today with no concerning complaints except for mild shortness of breath with exertion.  He has no chest pain, cough or hemoptysis.  He has no nausea, vomiting, diarrhea or constipation.  He has no headache or visual changes.  He denied having any significant weight loss or night sweats.  He is here today for evaluation with repeat CT scan of the chest for restaging of his disease.  MEDICAL HISTORY: Past Medical History:  Diagnosis Date   Aortic atherosclerosis ([facility]) 01/02/2020   Seen on CT exam February 2021   Arthritis    Asthma    Cancer ([facility])    skin,lung   COPD (chronic obstructive pulmonary disease) ([facility])    GERD (gastroesophageal reflux disease)    History of bronchitis    Hypertension    Mixed hyperlipidemia    Nocturia    Shortness of breath dyspnea    with exertion   Sleep apnea    will sometimes wear a CPAP   Spinal stenosis     ALLERGIES:  is allergic to adhesive [tape] and sulfa antibiotics.  MEDICATIONS:  Current Outpatient Medications  Medication Sig Dispense Refill   acetaminophen (TYLENOL) 500 MG tablet Take 500-1,000 mg by mouth daily.     albuterol (PROVENTIL HFA;VENTOLIN HFA) 108 (90 Base) MCG/ACT inhaler Inhale 2 puffs into the lungs every 6 (six) hours as needed for wheezing. 3 Inhaler 2   amLODipine (NORVASC) 10 MG  tablet TAKE 1 TABLET DAILY (Patient taking differently: Take 10 mg by mouth daily.) 90 tablet 1   Ascorbic Acid (VITAMIN C) 1000 MG tablet Take 1,000 mg by mouth daily.     aspirin 81 MG tablet Take 81 mg by mouth daily.     atorvastatin (LIPITOR) 80 MG tablet Take 1 tablet (80 mg total) by mouth daily. 30 tablet 5   Carboxymethylcellulose Sodium (THERATEARS) 0.25 % SOLN Place 1 drop into both eyes daily as needed (dry eyes).     Cholecalciferol (VITAMIN D) 50 MCG (2000 UT) tablet Take 2,000 Units by mouth daily.     clobetasol (TEMOVATE) 0.05 % external solution Apply 1 application topically every other day.     diclofenac Sodium (VOLTAREN) 1 % GEL Apply 1 application topically daily as needed for pain.     ezetimibe (ZETIA) 10 MG tablet Take 10 mg by mouth daily.     fluticasone (FLONASE) 50 MCG/ACT nasal spray USE 1 SPRAY IN EACH NOSTRIL DAILY AS NEEDED (Patient taking differently: Place 1 spray into both nostrils daily as needed for allergies.) 16 g 4   fluticasone-salmeterol (ADVAIR) 250-50 MCG/ACT AEPB Inhale 1 puff into the lungs in the morning and at bedtime.     gabapentin (NEURONTIN) 100 MG capsule Take 300 mg by mouth at bedtime.     HYDROcodone-acetaminophen (NORCO/VICODIN) 5-325 MG tablet Take by mouth. (Patient not taking: Reported on 04/27/2022)  hydrOXYzine (ATARAX/VISTARIL) 10 MG tablet Take 10 mg by mouth at bedtime.      ibuprofen (ADVIL,MOTRIN) 200 MG tablet Take 600 mg by mouth daily.     lidocaine (XYLOCAINE) 5 % ointment Apply 1 application topically daily as needed for mild pain (For Back and hip).     magnesium 30 MG tablet Take 30 mg by mouth daily at 6 (six) AM. Over the counter dose     metFORMIN (GLUCOPHAGE) 500 MG tablet Take 500 mg by mouth at bedtime.     omeprazole (PRILOSEC) 20 MG capsule TAKE 1 CAPSULE DAILY AS NEEDED (Patient taking differently: Take 20 mg by mouth daily.) 90 capsule 1   tiotropium (SPIRIVA HANDIHALER) 18 MCG inhalation capsule Place 1 capsule  (18 mcg total) into inhaler and inhale daily. 30 capsule 0   zinc gluconate 50 MG tablet Take 50 mg by mouth daily.     No current facility-administered medications for this visit.    SURGICAL HISTORY:  Past Surgical History:  Procedure Laterality Date   CIRCUMCISION     COLONOSCOPY  03/04/2004   QJJ:[id number] diverticula.  The remainder of the colonic mucosa appeared normal/normal rectum   COLONOSCOPY  05/22/2009   GYJ:EHUDJSH mucosa appeared normal/Left-sided diverticula/Normal rectum   COLONOSCOPY N/A 08/02/2014   Procedure: COLONOSCOPY;  Surgeon: Daneil Dolin, MD;  [facility];  Service: Endoscopy;  Laterality: N/A;  2:00 PM   COLONOSCOPY WITH PROPOFOL N/A 06/12/2021   Procedure: COLONOSCOPY WITH PROPOFOL;  Surgeon: Daneil Dolin, MD;  [facility];  Service: Endoscopy;  Laterality: N/A;  11:00am   KNEE ARTHROSCOPY Right 2004   LYMPH NODE DISSECTION N/A 04/08/2016   Procedure: LYMPH NODE DISSECTION;  Surgeon: Melrose Nakayama, MD;  Location: [facility];  Service: Thoracic;  Laterality: N/A;   SEPTOPLASTY     VIDEO ASSISTED THORACOSCOPY (VATS)/WEDGE RESECTION Left 04/08/2016   Procedure: VIDEO ASSISTED THORACOSCOPY (VATS)/WEDGE RESECTION;  Surgeon: Melrose Nakayama, MD;  Location: [facility];  Service: Thoracic;  Laterality: Left;   VIDEO BRONCHOSCOPY WITH ENDOBRONCHIAL NAVIGATION N/A 02/26/2016   Procedure: VIDEO BRONCHOSCOPY WITH ENDOBRONCHIAL NAVIGATION LEFT UPPER LOBE LUNG NODULE;  Surgeon: [name], MD;  Location: [facility];  Service: Thoracic;  Laterality: N/A;    REVIEW OF SYSTEMS:  A comprehensive review of systems was negative except for: Respiratory: positive for dyspnea on exertion   PHYSICAL EXAMINATION: General appearance: alert, cooperative, and no distress Head: Normocephalic, without obvious abnormality, atraumatic Neck: no adenopathy, no JVD, supple, symmetrical, trachea midline, and thyroid not enlarged, symmetric, no  tenderness/mass/nodules Lymph nodes: Cervical, supraclavicular, and axillary nodes normal. Resp: clear to auscultation bilaterally Back: symmetric, no curvature. ROM normal. No CVA tenderness. Cardio: regular rate and rhythm, S1, S2 normal, no murmur, click, rub or gallop GI: soft, non-tender; bowel sounds normal; no masses,  no organomegaly Extremities: extremities normal, atraumatic, no cyanosis or edema  ECOG PERFORMANCE STATUS: 1 - Symptomatic but completely ambulatory  Blood pressure 138/80, pulse 74, temperature 98.6 F (37 C), temperature source Oral, resp. rate 15, weight 198 lb 3.2 oz (89.9 kg), SpO2 95 %.  LABORATORY DATA: Lab Results  Component Value Date   WBC 7.7 06/02/2022   HGB 13.9 06/02/2022   HCT 40.9 06/02/2022   MCV 90.5 06/02/2022   PLT 287 06/02/2022      Chemistry      Component Value Date/Time   NA 141 06/02/2022 1209   NA 141 04/22/2022 0951   NA 140 05/18/2017 1056  K 3.9 06/02/2022 1209   K 4.0 05/18/2017 1056   CL 106 06/02/2022 1209   CO2 27 06/02/2022 1209   CO2 27 05/18/2017 1056   BUN 17 06/02/2022 1209   BUN 16 04/22/2022 0951   BUN 16.8 05/18/2017 1056   CREATININE 1.01 06/02/2022 1209   CREATININE 1.1 05/18/2017 1056      Component Value Date/Time   CALCIUM 9.2 06/02/2022 1209   CALCIUM 10.3 05/18/2017 1056   ALKPHOS 99 06/02/2022 1209   ALKPHOS 92 05/18/2017 1056   AST 19 06/02/2022 1209   AST 20 05/18/2017 1056   ALT 18 06/02/2022 1209   ALT 20 05/18/2017 1056   BILITOT 0.6 06/02/2022 1209   BILITOT 0.72 05/18/2017 1056       RADIOGRAPHIC STUDIES: CT Chest W Contrast  Result Date: 06/02/2022 CLINICAL DATA:  History of non-small cell lung cancer, staging. Follow-up diagnosed in 2018. * Tracking Code: BO * EXAM: CT CHEST WITH CONTRAST TECHNIQUE: Multidetector CT imaging of the chest was performed during intravenous contrast administration. RADIATION DOSE REDUCTION: This exam was performed according to the departmental  dose-optimization program which includes automated exposure control, adjustment of the mA and/or kV according to patient size and/or use of iterative reconstruction technique. CONTRAST:  1mL OMNIPAQUE IOHEXOL 300 MG/ML  SOLN COMPARISON:  Multiple priors including most recent chest CT June 02, 2021 FINDINGS: Cardiovascular: Aortic atherosclerosis without aneurysmal dilation. No central pulmonary embolus on this nondedicated study. Coronary artery calcifications. Normal size heart. No significant pericardial effusion/thickening. Mediastinum/Nodes: No suspicious thyroid nodule. Prominent mediastinal lymph nodes are unchanged over multiple priors. No pathologically enlarged mediastinal, hilar or axillary lymph nodes. Esophagus is grossly unremarkable. Lungs/Pleura: Centrilobular emphysema. Similar postoperative findings of wedge resection of the posterior lingula including a nodular soft tissue focus along the superior suture line measuring 13 mm on image 66/5 previously 14 mm. No new suspicious areas of nodularity in the treatment bed. No suspicious pulmonary nodules or masses. No pleural effusion or pneumothorax. Upper Abdomen: No acute abnormality. Musculoskeletal: No aggressive lytic or blastic lesion of bone. Multilevel degenerative changes spine. IMPRESSION: 1. Stable postoperative changes of wedge resection in the posterior lingula without evidence of local recurrence or thoracic metastatic disease. 2. Aortic Atherosclerosis (ICD10-I70.0) and Emphysema (ICD10-J43.9). Electronically Signed   By: Dahlia Bailiff M.D.   On: 06/02/2022 17:23   MR LUMBAR SPINE WO CONTRAST  Result Date: 05/20/2022 CLINICAL DATA:  Chronic low back pain, recently worsening. Left leg numbness. EXAM: MRI LUMBAR SPINE WITHOUT CONTRAST TECHNIQUE: Multiplanar, multisequence MR imaging of the lumbar spine was performed. No intravenous contrast was administered. COMPARISON:  MRI 03/27/2021 FINDINGS: Segmentation: The lowest lumbar type  non-rib-bearing vertebra is labeled as L5. Alignment: Mildly exaggerated lordosis in the lumbar spine. 4 mm degenerative retrolisthesis L1-2 and 5 mm degenerative retrolisthesis at L2-3 with 4 mm retrolisthesis at L3-4. Vertebrae: Posterolateral rod and pedicle screw fixation at L4-5 with solid interbody fusion. Prominent disc desiccation and loss of disc height all levels between T12 and L4. Type 1 degenerative endplate findings at P7-9 with type 2 degenerative endplate findings Y8-0 and L2-3. Conus medullaris and cauda equina: Conus extends to the L1 level. Conus and cauda equina appear normal. Paraspinal and other soft tissues: Sigmoid colon diverticulosis. Disc levels: T12-L1: No impingement. Degenerative facet arthropathy and mild disc bulge. L1-2: Borderline central narrowing of the thecal sac and borderline left foraminal stenosis due to disc bulge and facet arthropathy. L2-3: Moderate central narrowing of the thecal sac with mild to  moderate right foraminal stenosis and mild bilateral subarticular lateral recess stenosis due to disc bulge, facet arthropathy, and subluxation at this level. L3-4: Prominent central narrowing of the thecal sac with mild left foraminal stenosis, prominent left subarticular lateral recess stenosis, mild displacement of both L3 nerves in the lateral extraforaminal space due to intervertebral spurring, disc bulge, and facet arthropathy. Mildly worsened from 03/27/2021. L4-5: Borderline central narrowing of the thecal sac due to posterior intervertebral spurring. Postoperative findings at this level. L5-S1: Mild right and borderline left foraminal stenosis due to facet spurring and disc bulge. IMPRESSION: 1. Lumbar spondylosis and degenerative disc disease contributing to prominent impingement at L3-4; moderate impingement at L2-3; and mild impingement at L5-S1, as detailed above. The impingement at L3-4 is slightly worsened compared to the prior exam. 2. Stable postoperative  findings at the L4-5 level with solid interbody fusion. 3. Multilevel degenerative grade 1 subluxations. Electronically Signed   By: Van Clines M.D.   On: 05/20/2022 14:26     ASSESSMENT AND PLAN: This is a very pleasant 76 years old white male with a stage IA non-small cell lung cancer status post left upper lobe lingular segmentectomy in May 2017. The patient has been on observation since that time and he has no concerning complaints. He had repeat CT scan of the chest performed recently.  I personally and independently reviewed the scans and discussed the result with the patient today. His scan showed no concerning findings for disease recurrence or metastasis. I recommended for him to continue on observation with repeat CT scan of the chest in 1 year. He was advised to call immediately if he has any other concerning symptoms in the interval. The patient voices understanding of current disease status and treatment options and is in agreement with the current care plan. All questions were answered. The patient knows to call the clinic with any problems, questions or concerns. We can certainly see the patient much sooner if necessary.  Disclaimer: This note was dictated with voice recognition software. Similar sounding words can inadvertently be transcribed and may not be corrected upon review.

## 2022-06-09 ENCOUNTER — Encounter: Payer: Self-pay | Admitting: Family Medicine

## 2022-06-09 ENCOUNTER — Ambulatory Visit (INDEPENDENT_AMBULATORY_CARE_PROVIDER_SITE_OTHER): Payer: Medicare Other | Admitting: Family Medicine

## 2022-06-09 VITALS — BP 109/71 | HR 80 | Temp 98.6°F | Wt 197.2 lb

## 2022-06-09 DIAGNOSIS — I10 Essential (primary) hypertension: Secondary | ICD-10-CM | POA: Diagnosis not present

## 2022-06-09 DIAGNOSIS — M545 Low back pain, unspecified: Secondary | ICD-10-CM | POA: Diagnosis not present

## 2022-06-09 DIAGNOSIS — G8929 Other chronic pain: Secondary | ICD-10-CM | POA: Diagnosis not present

## 2022-06-09 MED ORDER — HYDROCODONE-ACETAMINOPHEN 5-325 MG PO TABS: ORAL_TABLET | ORAL | 0 refills | Status: DC

## 2022-06-09 NOTE — Progress Notes (Signed)
   Subjective:    Patient ID: Miguel Morales, male    DOB: Mar 24, 1946, 76 y.o.   MRN: [id number]  HPI Pt following up on blood pressure. States blood pressure has been good; checks pressure every so often. No symptoms. Has been having back trouble but is due for epidural this Thursday.  Patient relates that he would like to have prescription for hydrocodone does not abuse it uses it sparingly.  Does have chronic back pain.  Drug registry was checked.  Review of Systems     Objective:   Physical Exam Lungs clear heart regular pulse normal BP good subjective discomfort in his back       Assessment & Plan:  COPD lung cancer followed by specialist doing well they will do another scan in 1 years time  Blood pressure doing well today continue current measures.  Healthy diet recommended  Patient has chronic back pain see specialist on a regular basis Uses hydrocodone intermittently does not abuse it drug registry was checked prescription for 30 tablets was given  Follow-up 6 months sooner if any problems

## 2022-06-11 DIAGNOSIS — M5416 Radiculopathy, lumbar region: Secondary | ICD-10-CM | POA: Diagnosis not present

## 2022-06-11 DIAGNOSIS — M5116 Intervertebral disc disorders with radiculopathy, lumbar region: Secondary | ICD-10-CM | POA: Diagnosis not present

## 2022-06-19 DIAGNOSIS — M5137 Other intervertebral disc degeneration, lumbosacral region: Secondary | ICD-10-CM | POA: Diagnosis not present

## 2022-06-19 DIAGNOSIS — Z981 Arthrodesis status: Secondary | ICD-10-CM | POA: Diagnosis not present

## 2022-06-19 DIAGNOSIS — M4316 Spondylolisthesis, lumbar region: Secondary | ICD-10-CM | POA: Diagnosis not present

## 2022-06-19 DIAGNOSIS — M5136 Other intervertebral disc degeneration, lumbar region: Secondary | ICD-10-CM | POA: Diagnosis not present

## 2022-06-19 DIAGNOSIS — M47816 Spondylosis without myelopathy or radiculopathy, lumbar region: Secondary | ICD-10-CM | POA: Diagnosis not present

## 2022-07-02 DIAGNOSIS — J0100 Acute maxillary sinusitis, unspecified: Secondary | ICD-10-CM | POA: Diagnosis not present

## 2022-07-02 DIAGNOSIS — J40 Bronchitis, not specified as acute or chronic: Secondary | ICD-10-CM | POA: Diagnosis not present

## 2022-07-24 ENCOUNTER — Encounter: Payer: Self-pay | Admitting: Emergency Medicine

## 2022-07-24 ENCOUNTER — Ambulatory Visit
Admission: EM | Admit: 2022-07-24 | Discharge: 2022-07-24 | Disposition: A | Discharge status: Home / Self Care | Payer: Medicare Other | Attending: Nurse Practitioner | Admitting: Nurse Practitioner

## 2022-07-24 DIAGNOSIS — Z1152 Encounter for screening for COVID-19: Secondary | ICD-10-CM | POA: Diagnosis not present

## 2022-07-24 DIAGNOSIS — J069 Acute upper respiratory infection, unspecified: Secondary | ICD-10-CM

## 2022-07-24 DIAGNOSIS — R6889 Other general symptoms and signs: Secondary | ICD-10-CM | POA: Insufficient documentation

## 2022-07-24 LAB — RESP PANEL BY RT-PCR (FLU A&B, COVID) ARPGX2
Influenza A by PCR: NEGATIVE
Influenza B by PCR: NEGATIVE
SARS Coronavirus 2 by RT PCR: POSITIVE — AB

## 2022-07-24 MED ORDER — PROMETHAZINE-DM 6.25-15 MG/5ML PO SYRP: 5.0000 mL | ORAL_SOLUTION | Freq: Four times a day (QID) | ORAL | 0 refills | Status: DC | PRN

## 2022-07-24 MED ORDER — MOLNUPIRAVIR EUA 200MG CAPSULE: 4.0000 | ORAL_CAPSULE | Freq: Two times a day (BID) | ORAL | 0 refills | Status: AC

## 2022-07-24 MED ORDER — FLUTICASONE PROPIONATE 50 MCG/ACT NA SUSP: 2.0000 | Freq: Every day | NASAL | 0 refills | Status: DC

## 2022-07-24 NOTE — Discharge Instructions (Addendum)
COVID/flu test is pending.  Your results may not be received until tomorrow, which is a Saturday.  You would not be contacted about your results until the following Monday.  Accordingly, I am prescribing molnupiravir, an antiviral for you to start to help lessen the severity and duration of your symptoms if your COVID test is positive.  You will be able to see the results of your COVID testing by tomorrow in your MyChart account. Take medication as prescribed. Increase fluids and allow for plenty of rest. Recommend Tylenol as needed for pain, fever, or general discomfort. Warm salt water gargles 3-4 times daily to help with throat pain or discomfort. Recommend using a humidifier at bedtime during sleep to help with cough and nasal congestion. Sleep elevated on 2 pillows while cough symptoms persist. Follow-up if your symptoms do not improve.

## 2022-07-24 NOTE — ED Triage Notes (Signed)
Pt is present today with c/o cough, nasal congestion, and HA. Pt sx started Monday
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
## 2022-08-03 DIAGNOSIS — R059 Cough, unspecified: Secondary | ICD-10-CM | POA: Diagnosis not present

## 2022-08-03 DIAGNOSIS — J029 Acute pharyngitis, unspecified: Secondary | ICD-10-CM | POA: Diagnosis not present

## 2022-08-10 LAB — HM DIABETES EYE EXAM

## 2022-08-17 DIAGNOSIS — M48062 Spinal stenosis, lumbar region with neurogenic claudication: Secondary | ICD-10-CM | POA: Diagnosis not present

## 2022-08-17 DIAGNOSIS — Z981 Arthrodesis status: Secondary | ICD-10-CM | POA: Diagnosis not present

## 2022-08-17 DIAGNOSIS — Z01818 Encounter for other preprocedural examination: Secondary | ICD-10-CM | POA: Diagnosis not present

## 2022-08-18 ENCOUNTER — Encounter: Payer: Self-pay | Admitting: Family Medicine

## 2022-08-26 ENCOUNTER — Encounter: Payer: Self-pay | Admitting: *Deleted

## 2022-09-03 DIAGNOSIS — Z01818 Encounter for other preprocedural examination: Secondary | ICD-10-CM | POA: Diagnosis not present

## 2022-09-04 ENCOUNTER — Other Ambulatory Visit: Payer: Self-pay | Admitting: Family Medicine

## 2022-09-04 ENCOUNTER — Telehealth: Payer: Self-pay

## 2022-09-04 NOTE — Telephone Encounter (Signed)
Patient is requesting an additional pain medicine prescription due to having upcoming back surgery next month with Dr Jonelle Sports , [facility] hospital for his low back area, patient is ok with scheduling appt if needed, please advise

## 2022-09-10 ENCOUNTER — Ambulatory Visit
Admission: EM | Admit: 2022-09-10 | Discharge: 2022-09-10 | Disposition: A | Discharge status: Home / Self Care | Payer: No Typology Code available for payment source | Attending: Nurse Practitioner | Admitting: Nurse Practitioner

## 2022-09-10 DIAGNOSIS — L03116 Cellulitis of left lower limb: Secondary | ICD-10-CM | POA: Diagnosis not present

## 2022-09-10 MED ORDER — CEPHALEXIN 500 MG PO CAPS: 500.0000 mg | ORAL_CAPSULE | Freq: Four times a day (QID) | ORAL | 0 refills | Status: AC

## 2022-09-10 NOTE — ED Triage Notes (Signed)
Pt reports he has a wound and redness in the lower left leg after he scratched with his nails when taking his compression sock off x 5 days. Pt reports he will have back  surgery on 09/16/2022.
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
## 2022-09-10 NOTE — Discharge Instructions (Addendum)
You have cellulitis in your left leg.  Please take the Keflex 4 times daily for 5 days to treat this.  Stop using peroxide.  You can clean your wound with mild soap and water twice daily, then apply Bactroban ointment.  The redness will likely spread for the next couple of days, however then should start improving around Sunday.  Please follow-up if your symptoms worsen after Sunday.

## 2022-09-16 DIAGNOSIS — T84226A Displacement of internal fixation device of vertebrae, initial encounter: Secondary | ICD-10-CM | POA: Diagnosis not present

## 2022-09-16 DIAGNOSIS — M960 Pseudarthrosis after fusion or arthrodesis: Secondary | ICD-10-CM | POA: Diagnosis not present

## 2022-09-16 DIAGNOSIS — M48061 Spinal stenosis, lumbar region without neurogenic claudication: Secondary | ICD-10-CM | POA: Diagnosis not present

## 2022-09-18 ENCOUNTER — Telehealth: Payer: Self-pay | Admitting: *Deleted

## 2022-09-18 NOTE — Telephone Encounter (Signed)
Transition Care Management Follow-up Telephone Call Date of discharge and from where: 09/16/22- [place] How have you been since you were released from the hospital? Pretty good Any questions or concerns? No  Items Reviewed: Did the pt receive and understand the discharge instructions provided? Yes  Medications obtained and verified? Yes  Other? No  Any new allergies since your discharge? No  Dietary orders reviewed? No Do you have support at home? Yes   Home Care and Equipment/Supplies: Were home health services ordered? no If so, what is the name of the agency? N/A  Has the agency set up a time to come to the patient's home? no Were any new equipment or medical supplies ordered?  No What is the name of the medical supply agency? N/A Were you able to get the supplies/equipment? not applicable Do you have any questions related to the use of the equipment or supplies? No  Functional Questionnaire: (I = Independent and D = Dependent) ADLs: I  Bathing/Dressing- I  Meal Prep- I  Eating- I  Maintaining continence- I  Transferring/Ambulation- I  Managing Meds- I  Follow up appointments reviewed:  PCP Hospital f/u appt confirmed? Patient states he does not feel he needs to see PCP at time he is following up with neurosurgeon and doing well [facility] f/u appt confirmed? Yes  Yes  Scheduled Neurosurgeon 10/10/22 Are transportation arrangements needed? No  If their condition worsens, is the pt aware to call PCP or go to the Emergency Dept.? Yes Was the patient provided with contact information for the PCP's office or ED? Yes Was to pt encouraged to call back with questions or concerns? Yes

## 2022-11-06 DIAGNOSIS — Z6830 Body mass index (BMI) 30.0-30.9, adult: Secondary | ICD-10-CM | POA: Diagnosis not present

## 2022-11-06 DIAGNOSIS — J0140 Acute pansinusitis, unspecified: Secondary | ICD-10-CM | POA: Diagnosis not present

## 2022-12-10 ENCOUNTER — Ambulatory Visit: Payer: No Typology Code available for payment source | Admitting: Family Medicine

## 2023-01-14 ENCOUNTER — Encounter: Payer: Self-pay | Admitting: Emergency Medicine

## 2023-01-14 ENCOUNTER — Ambulatory Visit
Admission: EM | Admit: 2023-01-14 | Discharge: 2023-01-14 | Disposition: A | Discharge status: Home / Self Care | Payer: Medicare Other | Attending: Nurse Practitioner | Admitting: Nurse Practitioner

## 2023-01-14 ENCOUNTER — Other Ambulatory Visit: Payer: Self-pay

## 2023-01-14 ENCOUNTER — Ambulatory Visit (INDEPENDENT_AMBULATORY_CARE_PROVIDER_SITE_OTHER): Payer: Medicare Other

## 2023-01-14 DIAGNOSIS — J069 Acute upper respiratory infection, unspecified: Secondary | ICD-10-CM | POA: Diagnosis not present

## 2023-01-14 DIAGNOSIS — Z8709 Personal history of other diseases of the respiratory system: Secondary | ICD-10-CM

## 2023-01-14 DIAGNOSIS — J449 Chronic obstructive pulmonary disease, unspecified: Secondary | ICD-10-CM | POA: Diagnosis not present

## 2023-01-14 DIAGNOSIS — R059 Cough, unspecified: Secondary | ICD-10-CM

## 2023-01-14 DIAGNOSIS — J441 Chronic obstructive pulmonary disease with (acute) exacerbation: Secondary | ICD-10-CM

## 2023-01-14 LAB — POCT INFLUENZA A/B
Influenza A, POC: NEGATIVE
Influenza B, POC: NEGATIVE

## 2023-01-14 MED ORDER — ALBUTEROL SULFATE HFA 108 (90 BASE) MCG/ACT IN AERS: 2.0000 | INHALATION_SPRAY | Freq: Four times a day (QID) | RESPIRATORY_TRACT | 0 refills | Status: AC | PRN

## 2023-01-14 MED ORDER — CETIRIZINE HCL 10 MG PO TABS: 10.0000 mg | ORAL_TABLET | Freq: Every day | ORAL | 0 refills | Status: DC

## 2023-01-14 MED ORDER — PREDNISONE 20 MG PO TABS: 40.0000 mg | ORAL_TABLET | Freq: Every day | ORAL | 0 refills | Status: AC

## 2023-01-14 MED ORDER — GUAIFENESIN 100 MG/5ML PO LIQD: 10.0000 mL | Freq: Three times a day (TID) | ORAL | 0 refills | Status: AC

## 2023-01-14 NOTE — Discharge Instructions (Addendum)
Chest x-ray did not show pneumonia. Take medication as prescribed. Increase fluids and allow for plenty of rest. May take over-the-counter Tylenol as needed for pain, or general discomfort. Recommend using a humidifier in your bedroom at nighttime during sleep and sleeping elevated on pillows while cough symptoms persist. If your symptoms fail to improve with this treatment, recommend that you follow-up with your primary care physician or with pulmonology for further evaluation. Follow-up as needed.

## 2023-01-14 NOTE — ED Triage Notes (Signed)
Pt reports productive cough x3-4 weeks and reports headache,intermittent body aches a few days ago.
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
## 2023-01-22 ENCOUNTER — Ambulatory Visit (INDEPENDENT_AMBULATORY_CARE_PROVIDER_SITE_OTHER): Payer: Medicare Other

## 2023-01-22 VITALS — BP 122/64 | Ht 66.0 in | Wt 193.6 lb

## 2023-01-22 DIAGNOSIS — Z0000 Encounter for general adult medical examination without abnormal findings: Secondary | ICD-10-CM | POA: Diagnosis not present

## 2023-01-22 NOTE — Progress Notes (Signed)
Subjective:   Miguel Morales is a 77 y.o. male who presents for Medicare Annual/Subsequent preventive examination.  Review of Systems     Cardiac Risk Factors include: advanced age (>16men, >78 women);male gender;hypertension;dyslipidemia;diabetes mellitus;sedentary lifestyle     Objective:    Today's Vitals   01/22/23 0933  BP: 122/64  Weight: 193 lb 9.6 oz (87.8 kg)  Height: 5\' 6"  (1.676 m)   Body mass index is 31.25 kg/m.     01/22/2023   12:14 PM 01/13/2022    9:15 AM 06/12/2021    9:37 AM 06/10/2021    9:31 AM 06/04/2021   10:39 AM 06/05/2020    8:56 AM 10/09/2019   11:28 AM  Advanced Directives  Does Patient Have a Medical Advance Directive? No No No No No Yes No  Type of Advance Directive      Living will;Healthcare Power of Attorney   Does patient want to make changes to medical advance directive?    No - Patient declined No - Patient declined    Would patient like information on creating a medical advance directive? No - Patient declined No - Patient declined No - Patient declined No - Patient declined No - Patient declined No - Patient declined     Current Medications (verified) Outpatient Encounter Medications as of 01/22/2023  Medication Sig   acetaminophen (TYLENOL) 500 MG tablet Take 500-1,000 mg by mouth daily.   albuterol (VENTOLIN HFA) 108 (90 Base) MCG/ACT inhaler Inhale 2 puffs into the lungs every 6 (six) hours as needed for wheezing or shortness of breath.   amLODipine (NORVASC) 10 MG tablet TAKE 1 TABLET DAILY (Patient taking differently: Take 10 mg by mouth daily.)   Ascorbic Acid (VITAMIN C) 1000 MG tablet Take 1,000 mg by mouth daily.   aspirin 81 MG tablet Take 81 mg by mouth daily.   atorvastatin (LIPITOR) 80 MG tablet Take 1 tablet (80 mg total) by mouth daily.   Carboxymethylcellulose Sodium (THERATEARS) 0.25 % SOLN Place 1 drop into both eyes daily as needed (dry eyes).   cetirizine (ZYRTEC) 10 MG tablet Take 1 tablet (10 mg total) by mouth daily.    Cholecalciferol (VITAMIN D) 50 MCG (2000 UT) tablet Take 2,000 Units by mouth daily.   clobetasol (TEMOVATE) 0.05 % external solution Apply 1 application topically every other day.   diclofenac Sodium (VOLTAREN) 1 % GEL Apply 1 application topically daily as needed for pain.   ezetimibe (ZETIA) 10 MG tablet Take 10 mg by mouth daily.   fluticasone (FLONASE) 50 MCG/ACT nasal spray Place 2 sprays into both nostrils daily.   fluticasone-salmeterol (ADVAIR) 250-50 MCG/ACT AEPB Inhale 1 puff into the lungs in the morning and at bedtime.   gabapentin (NEURONTIN) 100 MG capsule Take 300 mg by mouth at bedtime.   guaiFENesin (ROBITUSSIN) 100 MG/5ML liquid Take 10 mLs by mouth every 8 (eight) hours for 10 days.   hydrOXYzine (ATARAX/VISTARIL) 10 MG tablet Take 10 mg by mouth at bedtime.    ibuprofen (ADVIL,MOTRIN) 200 MG tablet Take 600 mg by mouth daily.   lidocaine (XYLOCAINE) 5 % ointment Apply 1 application topically daily as needed for mild pain (For Back and hip).   magnesium 30 MG tablet Take 30 mg by mouth daily at 6 (six) AM. Over the counter dose   metFORMIN (GLUCOPHAGE) 500 MG tablet Take 500 mg by mouth at bedtime.   omeprazole (PRILOSEC) 20 MG capsule TAKE 1 CAPSULE DAILY AS NEEDED (Patient taking differently: Take 20 mg  by mouth daily.)   promethazine-dextromethorphan (PROMETHAZINE-DM) 6.25-15 MG/5ML syrup Take 5 mLs by mouth 4 (four) times daily as needed for cough.   tiotropium (SPIRIVA HANDIHALER) 18 MCG inhalation capsule Place 1 capsule (18 mcg total) into inhaler and inhale daily.   zinc gluconate 50 MG tablet Take 50 mg by mouth daily.   [DISCONTINUED] HYDROcodone-acetaminophen (NORCO/VICODIN) 5-325 MG tablet 1 every 4 hours as needed pain caution drowsiness   No facility-administered encounter medications on file as of 01/22/2023.    Allergies (verified) Adhesive [tape] and Sulfa antibiotics   History: Past Medical History:  Diagnosis Date   Allergy N/a   Aortic  atherosclerosis ([place]) 01/02/2020   Seen on CT exam February 2021   Arthritis    Asthma    Cancer ([place])    skin,lung   COPD (chronic obstructive pulmonary disease) (HCC)    GERD (gastroesophageal reflux disease)    History of bronchitis    Hypertension    Mixed hyperlipidemia    Nocturia    Shortness of breath dyspnea    with exertion   Sleep apnea    will sometimes wear a CPAP   Spinal stenosis    Past Surgical History:  Procedure Laterality Date   CIRCUMCISION     COLONOSCOPY  03/04/2004   [id number] diverticula.  The remainder of the colonic mucosa appeared normal/normal rectum   COLONOSCOPY  05/22/2009   [id number] mucosa appeared normal/Left-sided diverticula/Normal rectum   COLONOSCOPY N/A 08/02/2014   Procedure: COLONOSCOPY;  Surgeon: Daneil Dolin, MD;  [facility];  Service: Endoscopy;  Laterality: N/A;  2:00 PM   COLONOSCOPY WITH PROPOFOL N/A 06/12/2021   Procedure: COLONOSCOPY WITH PROPOFOL;  Surgeon: Daneil Dolin, MD;  [facility];  Service: Endoscopy;  Laterality: N/A;  11:00am   KNEE ARTHROSCOPY Right 11/09/2002   LYMPH NODE DISSECTION N/A 04/08/2016   Procedure: LYMPH NODE DISSECTION;  Surgeon: Melrose Nakayama, MD;  Location: [facility];  Service: Thoracic;  Laterality: N/A;   SEPTOPLASTY     SPINE SURGERY     VIDEO ASSISTED THORACOSCOPY (VATS)/WEDGE RESECTION Left 04/08/2016   Procedure: VIDEO ASSISTED THORACOSCOPY (VATS)/WEDGE RESECTION;  Surgeon: Melrose Nakayama, MD;  Location: [facility];  Service: Thoracic;  Laterality: Left;   VIDEO BRONCHOSCOPY WITH ENDOBRONCHIAL NAVIGATION N/A 02/26/2016   Procedure: VIDEO BRONCHOSCOPY WITH ENDOBRONCHIAL NAVIGATION LEFT UPPER LOBE LUNG NODULE;  Surgeon: [name], MD;  Location: [facility];  Service: Thoracic;  Laterality: N/A;   Family History  Problem Relation Age of Onset   Cancer Mother    Social History   Socioeconomic History   Marital status: Married    Spouse name: [name]    Number of children: 2   Years of education: Not on file   Highest education level: Not on file  Occupational History   Not on file  Tobacco Use   Smoking status: Former    Packs/day: 1.00    Years: 30.00    Additional pack years: 0.00    Total pack years: 30.00    Types: Cigarettes    Start date: 11/09/1958    Quit date: 11/10/2003    Years since quitting: 19.2   Smokeless tobacco: Never  Vaping Use   Vaping Use: Never used  Substance and Sexual Activity   Alcohol use: Yes    Alcohol/week: 3.0 standard drinks of alcohol    Types: 3 Glasses of wine per week    Comment: Occasionally   Drug use: No  Sexual activity: Yes  Other Topics Concern   Not on file  Social History Narrative   Married x 12 years 03/2022.    2 children living, 1 deceased.    3 step children   7 grandchildren   7 great grandchildren   Social Determinants of Health   Financial Resource Strain: Low Risk  (01/22/2023)   Overall Financial Resource Strain (CARDIA)    Difficulty of Paying Living Expenses: Not hard at all  Food Insecurity: No Food Insecurity (01/22/2023)   Hunger Vital Sign    Worried About Running Out of Food in the Last Year: Never true    Ran Out of Food in the Last Year: Never true  Transportation Needs: No Transportation Needs (01/22/2023)   PRAPARE - Hydrologist (Medical): No    Lack of Transportation (Non-Medical): No  Physical Activity: Insufficiently Active (01/22/2023)   Exercise Vital Sign    Days of Exercise per Week: 2 days    Minutes of Exercise per Session: 20 min  Stress: No Stress Concern Present (01/22/2023)   [place]    Feeling of Stress : Not at all  Social Connections: [place] (01/22/2023)   Social Connection and Isolation Panel [NHANES]    Frequency of Communication with Friends and Family: More than three times a week    Frequency of Social Gatherings with  Friends and Family: Three times a week    Attends Religious Services: More than 4 times per year    Active Member of Clubs or Organizations: Yes    Attends Music therapist: More than 4 times per year    Marital Status: Married    Tobacco Counseling Counseling given: Not Answered   Clinical Intake:  Pre-visit preparation completed: Yes  Pain : No/denies pain     Diabetes: No  How often do you need to have someone help you when you read instructions, pamphlets, or other written materials from your doctor or pharmacy?: 2 - Rarely  Diabetic?Yes   Nutrition Risk Assessment:  Has the patient had any N/V/D within the last 2 months?  No  Does the patient have any non-healing wounds?  No  Has the patient had any unintentional weight loss or weight gain?  No   Diabetes:  Is the patient diabetic?  Yes  If diabetic, was a CBG obtained today?  No  Did the patient bring in their glucometer from home?   No  How often do you monitor your CBG's? Daily.   Financial Strains and Diabetes Management:  Are you having any financial strains with the device, your supplies or your medication? No .  Does the patient want to be seen by Chronic Care Management for management of their diabetes?  No  Would the patient like to be referred to a Nutritionist or for Diabetic Management?  No   Diabetic Exams:  Diabetic Eye Exam: Completed 08/10/22 Diabetic Foot Exam: Completed 06/09/22   Interpreter Needed?: No  Information entered by :: Denman George LPN   Activities of Daily Living    01/22/2023   12:15 PM 01/18/2023   10:03 AM  In your present state of health, do you have any difficulty performing the following activities:  Hearing? 1 1  Vision? 0 0  Difficulty concentrating or making decisions? 0 0  Walking or climbing stairs? 0 0  Dressing or bathing? 0 0  Doing errands, shopping? 0 0  Preparing Food  and eating ? N N  Using the Toilet? N N  In the past six months, have  you accidently leaked urine? N N  Do you have problems with loss of bowel control? N N  Managing your Medications? N N  Managing your Finances? N N  Housekeeping or managing your Housekeeping? N N    Patient Care Team: [name], MD as PCP - General (Family Medicine) Cloyd Stagers, MD as Referring Physician (Ophthalmology) Clinic, Arville Lime, MD as Referring Physician (Neurosurgery)  Indicate any recent Medical Services you may have received from other than [facility] providers in the past year (date may be approximate).     Assessment:   This is a routine wellness examination for [place].  Hearing/Vision screen Hearing Screening - Comments:: Hard of hearing; bilateral hearing aids   Vision Screening - Comments:: Wears rx glasses - up to date with routine eye exams with Dr. Marlon Pel and [facility]    Dietary issues and exercise activities discussed: Current Exercise Habits: Home exercise routine, Type of exercise: walking, Time (Minutes): 30, Frequency (Times/Week): 3, Weekly Exercise (Minutes/Week): 90, Intensity: Mild   Goals Addressed   None   Depression Screen    01/22/2023   12:13 PM 01/13/2022    9:11 AM 02/27/2021   10:09 AM 10/01/2020   10:41 AM 05/01/2020    9:38 AM 01/10/2018    9:34 AM 12/28/2016   10:16 AM  PHQ 2/9 Scores  PHQ - 2 Score 0 0 0 0 0 0 0    Fall Risk    01/22/2023    9:34 AM 01/18/2023   10:03 AM 01/13/2022    9:15 AM 01/12/2022   10:03 AM 02/27/2021   10:08 AM  Fall Risk   Falls in the past year? 0 0 1 1 0  Number falls in past yr: 0 0 0 0 0  Injury with Fall? 0 0 0 0 0  Risk for fall due to : No Fall Risks  Impaired balance/gait;History of fall(s)    Follow up Falls prevention discussed;Education provided;Falls evaluation completed  Falls prevention discussed  Falls evaluation completed    FALL RISK PREVENTION PERTAINING TO THE HOME:  Any stairs in or around the home? No  If so, are there any without handrails? No  Home  free of loose throw rugs in walkways, pet beds, electrical cords, etc? Yes  Adequate lighting in your home to reduce risk of falls? Yes   ASSISTIVE DEVICES UTILIZED TO PREVENT FALLS:  Life alert? No  Use of a cane, walker or w/c? No  Grab bars in the bathroom? Yes  Shower chair or bench in shower? No  Elevated toilet seat or a handicapped toilet? Yes   TIMED UP AND GO:  Was the test performed? Yes .  Length of time to ambulate 10 feet: 5 sec.   Gait steady and fast without use of assistive device  Cognitive Function:        01/22/2023   12:15 PM 01/13/2022    9:17 AM  6CIT Screen  What Year? 0 points 0 points  What month? 0 points 0 points  What time? 0 points 0 points  Count back from 20 0 points 0 points  Months in reverse 0 points 0 points  Repeat phrase 0 points 2 points  Total Score 0 points 2 points    Immunizations Immunization History  Administered Date(s) Administered   Fluad Quad(high Dose 65+) 10/02/2020   Influenza Split  08/20/2009, 07/10/2018   Influenza, High Dose Seasonal PF 07/21/2016, 07/14/2017, 09/04/2019   Influenza,inj,Quad PF,6+ Mos 07/26/2018   Influenza-Unspecified 08/07/2014, 07/23/2015, 08/23/2021   Janssen (J&J) SARS-COV-2 Vaccination 01/21/2020   Pneumococcal Conjugate-13 05/09/2014   Pneumococcal Polysaccharide-23 11/10/2011   Td 02/02/2001, 11/09/2010   Zoster, Live 12/14/2012    TDAP status: Due, Education has been provided regarding the importance of this vaccine. Advised may receive this vaccine at local pharmacy or Health Dept. Aware to provide a copy of the vaccination record if obtained from local pharmacy or Health Dept. Verbalized acceptance and understanding.  Flu Vaccine status: Up to date  Pneumococcal vaccine status: Up to date  Covid-19 vaccine status: Information provided on how to obtain vaccines.   Qualifies for Shingles Vaccine? Yes   Zostavax completed Yes   Shingrix Completed?: No.    Education has been provided  regarding the importance of this vaccine. Patient has been advised to call insurance company to determine out of pocket expense if they have not yet received this vaccine. Advised may also receive vaccine at local pharmacy or Health Dept. Verbalized acceptance and understanding.  Screening Tests Health Maintenance  Topic Date Due   Zoster Vaccines- Shingrix (1 of 2) Never done   COVID-19 Vaccine (2 - Janssen risk series) 02/18/2020   DTaP/Tdap/Td (3 - Tdap) 11/09/2020   INFLUENZA VACCINE  06/09/2022   HEMOGLOBIN A1C  10/22/2022   Diabetic kidney evaluation - Urine ACR  04/23/2023   Diabetic kidney evaluation - eGFR measurement  06/03/2023   FOOT EXAM  06/10/2023   OPHTHALMOLOGY EXAM  08/11/2023   Medicare Annual Wellness (AWV)  01/22/2024   Pneumonia Vaccine 7+ Years old  Completed   Hepatitis C Screening  Completed   HPV VACCINES  Aged Out   COLONOSCOPY (Pts 45-82yrs Insurance coverage will need to be confirmed)  Discontinued    Health Maintenance  Health Maintenance Due  Topic Date Due   Zoster Vaccines- Shingrix (1 of 2) Never done   COVID-19 Vaccine (2 - Janssen risk series) 02/18/2020   DTaP/Tdap/Td (3 - Tdap) 11/09/2020   INFLUENZA VACCINE  06/09/2022   HEMOGLOBIN A1C  10/22/2022    Colorectal cancer screening: No longer required.   Lung Cancer Screening: (Low Dose CT Chest recommended if Age 84-80 years, 30 pack-year currently smoking OR have quit w/in 15years.) does not qualify.   Lung Cancer Screening Referral: n/a   Additional Screening:  Hepatitis C Screening: does qualify; Completed 11/08/18  Vision Screening: Recommended annual ophthalmology exams for early detection of glaucoma and other disorders of the eye. Is the patient up to date with their annual eye exam?  Yes  Who is the provider or what is the name of the office in which the patient attends annual eye exams? Dr. Marlon Pel If pt is not established with a provider, would they like to be referred to a  provider to establish care? No .   Dental Screening: Recommended annual dental exams for proper oral hygiene  Community Resource Referral / Chronic Care Management: CRR required this visit?  No   CCM required this visit?  No      Plan:     I have personally reviewed and noted the following in the patient's chart:   Medical and social history Use of alcohol, tobacco or illicit drugs  Current medications and supplements including opioid prescriptions. Patient is not currently taking opioid prescriptions. Functional ability and status Nutritional status Physical activity Advanced directives List of other physicians Hospitalizations, surgeries,  and ER visits in previous 12 months Vitals Screenings to include cognitive, depression, and falls Referrals and appointments  In addition, I have reviewed and discussed with patient certain preventive protocols, quality metrics, and best practice recommendations. A written personalized care plan for preventive services as well as general preventive health recommendations were provided to patient.     Denman George [place], [place]   [id number]   Nurse Notes: No concerns

## 2023-01-22 NOTE — Patient Instructions (Signed)
[name] , Thank you for taking time to come for your Medicare Wellness Visit. I appreciate your ongoing commitment to your health goals. Please review the following plan we discussed and let me know if I can assist you in the future.   These are the goals we discussed:  Goals      Exercise 3x per week (30 min per time)     Continue to exercise, eat healthy and spend time with grandkids        This is a list of the screening recommended for you and due dates:  Health Maintenance  Topic Date Due   Zoster (Shingles) Vaccine (1 of 2) Never done   COVID-19 Vaccine (2 - Janssen risk series) 02/18/2020   DTaP/Tdap/Td vaccine (3 - Tdap) 11/09/2020   Flu Shot  06/09/2022   Hemoglobin A1C  10/22/2022   Medicare Annual Wellness Visit  01/14/2023   Yearly kidney health urinalysis for diabetes  04/23/2023   Yearly kidney function blood test for diabetes  06/03/2023   Complete foot exam   06/10/2023   Eye exam for diabetics  08/11/2023   Pneumonia Vaccine  Completed   Hepatitis C Screening: USPSTF Recommendation to screen - Ages 4-79 yo.  Completed   HPV Vaccine  Aged Out   Colon Cancer Screening  Discontinued    Advanced directives: Forms are available if you choose in the future to pursue completion.  This is recommended in order to make sure that your health wishes are honored in the event that you are unable to verbalize them to the provider.    Conditions/risks identified: Aim for 30 minutes of exercise or brisk walking, 6-8 glasses of water, and 5 servings of fruits and vegetables each day.   Next appointment: Follow up in one year for your annual wellness visit.   Preventive Care 1 Years and Older, Male  Preventive care refers to lifestyle choices and visits with your health care provider that can promote health and wellness. What does preventive care include? A yearly physical exam. This is also called an annual well check. Dental exams once or twice a year. Routine eye  exams. Ask your health care provider how often you should have your eyes checked. Personal lifestyle choices, including: Daily care of your teeth and gums. Regular physical activity. Eating a healthy diet. Avoiding tobacco and drug use. Limiting alcohol use. Practicing safe sex. Taking low doses of aspirin every day. Taking vitamin and mineral supplements as recommended by your health care provider. What happens during an annual well check? The services and screenings done by your health care provider during your annual well check will depend on your age, overall health, lifestyle risk factors, and family history of disease. Counseling  Your health care provider may ask you questions about your: Alcohol use. Tobacco use. Drug use. Emotional well-being. Home and relationship well-being. Sexual activity. Eating habits. History of falls. Memory and ability to understand (cognition). Work and work Statistician. Screening  You may have the following tests or measurements: Height, weight, and BMI. Blood pressure. Lipid and cholesterol levels. These may be checked every 5 years, or more frequently if you are over 88 years old. Skin check. Lung cancer screening. You may have this screening every year starting at age 71 if you have a 30-pack-year history of smoking and currently smoke or have quit within the past 15 years. Fecal occult blood test (FOBT) of the stool. You may have this test every year starting at age 58.  Flexible sigmoidoscopy or colonoscopy. You may have a sigmoidoscopy every 5 years or a colonoscopy every 10 years starting at age 70. Prostate cancer screening. Recommendations will vary depending on your family history and other risks. Hepatitis C blood test. Hepatitis B blood test. Sexually transmitted disease (STD) testing. Diabetes screening. This is done by checking your blood sugar (glucose) after you have not eaten for a while (fasting). You may have this done every  1-3 years. Abdominal aortic aneurysm (AAA) screening. You may need this if you are a current or former smoker. Osteoporosis. You may be screened starting at age 3 if you are at high risk. Talk with your health care provider about your test results, treatment options, and if necessary, the need for more tests. Vaccines  Your health care provider may recommend certain vaccines, such as: Influenza vaccine. This is recommended every year. Tetanus, diphtheria, and acellular pertussis (Tdap, Td) vaccine. You may need a Td booster every 10 years. Zoster vaccine. You may need this after age 56. Pneumococcal 13-valent conjugate (PCV13) vaccine. One dose is recommended after age 6. Pneumococcal polysaccharide (PPSV23) vaccine. One dose is recommended after age 55. Talk to your health care provider about which screenings and vaccines you need and how often you need them. This information is not intended to replace advice given to you by your health care provider. Make sure you discuss any questions you have with your health care provider. Document Released: 11/22/2015 Document Revised: 07/15/2016 Document Reviewed: 08/27/2015 Elsevier Interactive Patient Education  2017 [place] Prevention in the Home Falls can cause injuries. They can happen to people of all ages. There are many things you can do to make your home safe and to help prevent falls. What can I do on the outside of my home? Regularly fix the edges of walkways and driveways and fix any cracks. Remove anything that might make you trip as you walk through a door, such as a raised step or threshold. Trim any bushes or trees on the path to your home. Use bright outdoor lighting. Clear any walking paths of anything that might make someone trip, such as rocks or tools. Regularly check to see if handrails are loose or broken. Make sure that both sides of any steps have handrails. Any raised decks and porches should have guardrails on  the edges. Have any leaves, snow, or ice cleared regularly. Use sand or salt on walking paths during winter. Clean up any spills in your garage right away. This includes oil or grease spills. What can I do in the bathroom? Use night lights. Install grab bars by the toilet and in the tub and shower. Do not use towel bars as grab bars. Use non-skid mats or decals in the tub or shower. If you need to sit down in the shower, use a plastic, non-slip stool. Keep the floor dry. Clean up any water that spills on the floor as soon as it happens. Remove soap buildup in the tub or shower regularly. Attach bath mats securely with double-sided non-slip rug tape. Do not have throw rugs and other things on the floor that can make you trip. What can I do in the bedroom? Use night lights. Make sure that you have a light by your bed that is easy to reach. Do not use any sheets or blankets that are too big for your bed. They should not hang down onto the floor. Have a firm chair that has side arms. You can use this  for support while you get dressed. Do not have throw rugs and other things on the floor that can make you trip. What can I do in the kitchen? Clean up any spills right away. Avoid walking on wet floors. Keep items that you use a lot in easy-to-reach places. If you need to reach something above you, use a strong step stool that has a grab bar. Keep electrical cords out of the way. Do not use floor polish or wax that makes floors slippery. If you must use wax, use non-skid floor wax. Do not have throw rugs and other things on the floor that can make you trip. What can I do with my stairs? Do not leave any items on the stairs. Make sure that there are handrails on both sides of the stairs and use them. Fix handrails that are broken or loose. Make sure that handrails are as long as the stairways. Check any carpeting to make sure that it is firmly attached to the stairs. Fix any carpet that is loose  or worn. Avoid having throw rugs at the top or bottom of the stairs. If you do have throw rugs, attach them to the floor with carpet tape. Make sure that you have a light switch at the top of the stairs and the bottom of the stairs. If you do not have them, ask someone to add them for you. What else can I do to help prevent falls? Wear shoes that: Do not have high heels. Have rubber bottoms. Are comfortable and fit you well. Are closed at the toe. Do not wear sandals. If you use a stepladder: Make sure that it is fully opened. Do not climb a closed stepladder. Make sure that both sides of the stepladder are locked into place. Ask someone to hold it for you, if possible. Clearly mark and make sure that you can see: Any grab bars or handrails. First and last steps. Where the edge of each step is. Use tools that help you move around (mobility aids) if they are needed. These include: Canes. Walkers. Scooters. Crutches. Turn on the lights when you go into a dark area. Replace any light bulbs as soon as they burn out. Set up your furniture so you have a clear path. Avoid moving your furniture around. If any of your floors are uneven, fix them. If there are any pets around you, be aware of where they are. Review your medicines with your doctor. Some medicines can make you feel dizzy. This can increase your chance of falling. Ask your doctor what other things that you can do to help prevent falls. This information is not intended to replace advice given to you by your health care provider. Make sure you discuss any questions you have with your health care provider. Document Released: 08/22/2009 Document Revised: 04/02/2016 Document Reviewed: 11/30/2014 Elsevier Interactive Patient Education  2017 [employer].

## 2023-02-02 DIAGNOSIS — R051 Acute cough: Secondary | ICD-10-CM | POA: Diagnosis not present

## 2023-02-02 DIAGNOSIS — J441 Chronic obstructive pulmonary disease with (acute) exacerbation: Secondary | ICD-10-CM | POA: Diagnosis not present

## 2023-02-23 DIAGNOSIS — J3089 Other allergic rhinitis: Secondary | ICD-10-CM | POA: Diagnosis not present

## 2023-02-23 DIAGNOSIS — Z6831 Body mass index (BMI) 31.0-31.9, adult: Secondary | ICD-10-CM | POA: Diagnosis not present

## 2023-02-23 DIAGNOSIS — E669 Obesity, unspecified: Secondary | ICD-10-CM | POA: Diagnosis not present

## 2023-02-26 DIAGNOSIS — J209 Acute bronchitis, unspecified: Secondary | ICD-10-CM | POA: Diagnosis not present

## 2023-04-23 DIAGNOSIS — J329 Chronic sinusitis, unspecified: Secondary | ICD-10-CM | POA: Diagnosis not present

## 2023-04-23 DIAGNOSIS — Z9889 Other specified postprocedural states: Secondary | ICD-10-CM | POA: Diagnosis not present

## 2023-04-23 DIAGNOSIS — R0982 Postnasal drip: Secondary | ICD-10-CM | POA: Diagnosis not present

## 2023-04-23 DIAGNOSIS — J309 Allergic rhinitis, unspecified: Secondary | ICD-10-CM | POA: Diagnosis not present

## 2023-05-31 ENCOUNTER — Encounter (HOSPITAL_COMMUNITY): Payer: Self-pay

## 2023-05-31 ENCOUNTER — Inpatient Hospital Stay: Payer: Medicare Other | Attending: Internal Medicine

## 2023-05-31 ENCOUNTER — Other Ambulatory Visit: Payer: Self-pay

## 2023-05-31 ENCOUNTER — Ambulatory Visit (HOSPITAL_COMMUNITY)
Admission: RE | Admit: 2023-05-31 | Discharge: 2023-05-31 | Disposition: A | Discharge status: Home / Self Care | Payer: Medicare Other | Source: Ambulatory Visit | Attending: Internal Medicine | Admitting: Internal Medicine

## 2023-05-31 DIAGNOSIS — I700 Atherosclerosis of aorta: Secondary | ICD-10-CM | POA: Diagnosis not present

## 2023-05-31 DIAGNOSIS — C3411 Malignant neoplasm of upper lobe, right bronchus or lung: Secondary | ICD-10-CM | POA: Diagnosis not present

## 2023-05-31 DIAGNOSIS — C3490 Malignant neoplasm of unspecified part of unspecified bronchus or lung: Secondary | ICD-10-CM

## 2023-05-31 DIAGNOSIS — Z902 Acquired absence of lung [part of]: Secondary | ICD-10-CM | POA: Insufficient documentation

## 2023-05-31 DIAGNOSIS — J432 Centrilobular emphysema: Secondary | ICD-10-CM | POA: Diagnosis not present

## 2023-05-31 DIAGNOSIS — Z85118 Personal history of other malignant neoplasm of bronchus and lung: Secondary | ICD-10-CM | POA: Insufficient documentation

## 2023-05-31 LAB — CMP (CANCER CENTER ONLY)
ALT: 15 U/L (ref 0–44)
AST: 18 U/L (ref 15–41)
Albumin: 4.1 g/dL (ref 3.5–5.0)
Alkaline Phosphatase: 99 U/L (ref 38–126)
Anion gap: 9 (ref 5–15)
BUN: 14 mg/dL (ref 8–23)
CO2: 25 mmol/L (ref 22–32)
Calcium: 9.4 mg/dL (ref 8.9–10.3)
Chloride: 107 mmol/L (ref 98–111)
Creatinine: 0.95 mg/dL (ref 0.61–1.24)
GFR, Estimated: >60 mL/min (ref >60)
Glucose, Bld: 132 mg/dL — ABNORMAL HIGH (ref 70–99)
Potassium: 4.4 mmol/L (ref 3.5–5.1)
Sodium: 141 mmol/L (ref 135–145)
Total Bilirubin: 0.7 mg/dL (ref 0.3–1.2)
Total Protein: 6.7 g/dL (ref 6.5–8.1)

## 2023-05-31 LAB — CBC WITH DIFFERENTIAL (CANCER CENTER ONLY)
Abs Immature Granulocytes: 0.04 10*3/uL (ref 0.00–0.07)
Basophils Absolute: 0.1 10*3/uL (ref 0.0–0.1)
Basophils Relative: 1 %
Eosinophils Absolute: 0.2 10*3/uL (ref 0.0–0.5)
Eosinophils Relative: 3 %
HCT: 41.5 % (ref 39.0–52.0)
Hemoglobin: 13.5 g/dL (ref 13.0–17.0)
Immature Granulocytes: 1 %
Lymphocytes Relative: 25 %
Lymphs Abs: 1.6 10*3/uL (ref 0.7–4.0)
MCH: 29.5 pg (ref 26.0–34.0)
MCHC: 32.5 g/dL (ref 30.0–36.0)
MCV: 90.6 fL (ref 80.0–100.0)
Monocytes Absolute: 0.6 10*3/uL (ref 0.1–1.0)
Monocytes Relative: 9 %
Neutro Abs: 4.1 10*3/uL (ref 1.7–7.7)
Neutrophils Relative %: 61 %
Platelet Count: 305 10*3/uL (ref 150–400)
RBC: 4.58 MIL/uL (ref 4.22–5.81)
RDW: 14 % (ref 11.5–15.5)
WBC Count: 6.6 10*3/uL (ref 4.0–10.5)
nRBC: 0 % (ref 0.0–0.2)

## 2023-05-31 MED ORDER — SODIUM CHLORIDE (PF) 0.9 % IJ SOLN
INTRAMUSCULAR | Status: AC
  Filled 2023-05-31: qty 50

## 2023-05-31 MED ORDER — IOHEXOL 300 MG/ML  SOLN
75.0000 mL | Freq: Once | INTRAMUSCULAR | Status: AC | PRN
  Administered 2023-05-31: 75 mL via INTRAVENOUS

## 2023-06-02 ENCOUNTER — Other Ambulatory Visit: Payer: Self-pay

## 2023-06-02 ENCOUNTER — Inpatient Hospital Stay (HOSPITAL_BASED_OUTPATIENT_CLINIC_OR_DEPARTMENT_OTHER): Payer: Medicare Other | Admitting: Internal Medicine

## 2023-06-02 VITALS — BP 122/62 | HR 89 | Temp 97.9°F | Resp 18 | Ht 66.0 in | Wt 196.6 lb

## 2023-06-02 DIAGNOSIS — C3490 Malignant neoplasm of unspecified part of unspecified bronchus or lung: Secondary | ICD-10-CM | POA: Diagnosis not present

## 2023-06-02 DIAGNOSIS — Z85118 Personal history of other malignant neoplasm of bronchus and lung: Secondary | ICD-10-CM | POA: Diagnosis not present

## 2023-06-02 DIAGNOSIS — Z902 Acquired absence of lung [part of]: Secondary | ICD-10-CM | POA: Diagnosis not present

## 2023-06-02 NOTE — Progress Notes (Signed)
[facility] [phone]   [phone]  OFFICE PROGRESS NOTE  Babs Sciara, MD [address] [place] [address]  DIAGNOSIS: Stage IA (T1a, N0, M0) non-small cell lung cancer, adenocarcinoma with negative EGFR, ALK, ROS 1 and BRAF mutations diagnosed in May 2017. PDL 1 expression is 10%  PRIOR THERAPY: Status post left upper lobe lingular segmentectomy with lymph node dissection under the care of Dr. Dorris Fetch on 04/08/2016.  CURRENT THERAPY: Observation.  INTERVAL HISTORY: Miguel Morales 77 y.o. male came to the clinic today for routine annual follow-up visit.  The patient is feeling fine today with no concerning complaints.  He denied having any current chest pain but has shortness of breath with exertion secondary to COPD.  He denied having any nausea, vomiting, diarrhea or constipation.  He has no chest pain, cough or hemoptysis.  He has no recent weight loss or night sweats.  He had back surgery few months ago.  The patient is here today for evaluation with repeat CT scan of the chest for restaging of his disease.  MEDICAL HISTORY: Past Medical History:  Diagnosis Date   Allergy N/a   Aortic atherosclerosis (HCC) 01/02/2020   Seen on CT exam February 2021   Arthritis    Asthma    COPD (chronic obstructive pulmonary disease) (HCC)    GERD (gastroesophageal reflux disease)    History of bronchitis    Hypertension    Mixed hyperlipidemia    Nocturia    NSCL CA 01/2016   skin,lung   Shortness of breath dyspnea    with exertion   Sleep apnea    will sometimes wear a CPAP   Spinal stenosis     ALLERGIES:  is allergic to adhesive [tape] and sulfa antibiotics.  MEDICATIONS:  Current Outpatient Medications  Medication Sig Dispense Refill   acetaminophen (TYLENOL) 500 MG tablet Take 500-1,000 mg by mouth daily.     albuterol (VENTOLIN HFA) 108 (90 Base) MCG/ACT inhaler Inhale 2 puffs into the lungs every 6 (six) hours  as needed for wheezing or shortness of breath. 8 g 0   amLODipine (NORVASC) 10 MG tablet TAKE 1 TABLET DAILY (Patient taking differently: Take 10 mg by mouth daily.) 90 tablet 1   Ascorbic Acid (VITAMIN C) 1000 MG tablet Take 1,000 mg by mouth daily.     aspirin 81 MG tablet Take 81 mg by mouth daily.     atorvastatin (LIPITOR) 80 MG tablet Take 1 tablet (80 mg total) by mouth daily. 30 tablet 5   Carboxymethylcellulose Sodium (THERATEARS) 0.25 % SOLN Place 1 drop into both eyes daily as needed (dry eyes).     cetirizine (ZYRTEC) 10 MG tablet Take 1 tablet (10 mg total) by mouth daily. 30 tablet 0   Cholecalciferol (VITAMIN D) 50 MCG (2000 UT) tablet Take 2,000 Units by mouth daily.     clobetasol (TEMOVATE) 0.05 % external solution Apply 1 application topically every other day.     diclofenac Sodium (VOLTAREN) 1 % GEL Apply 1 application topically daily as needed for pain.     ezetimibe (ZETIA) 10 MG tablet Take 10 mg by mouth daily.     fluticasone (FLONASE) 50 MCG/ACT nasal spray Place 2 sprays into both nostrils daily. 16 g 0   fluticasone-salmeterol (ADVAIR) 250-50 MCG/ACT AEPB Inhale 1 puff into the lungs in the morning and at bedtime.     gabapentin (NEURONTIN) 100 MG capsule Take 300 mg  by mouth at bedtime.     hydrOXYzine (ATARAX/VISTARIL) 10 MG tablet Take 10 mg by mouth at bedtime.      ibuprofen (ADVIL,MOTRIN) 200 MG tablet Take 600 mg by mouth daily.     lidocaine (XYLOCAINE) 5 % ointment Apply 1 application topically daily as needed for mild pain (For Back and hip).     magnesium 30 MG tablet Take 30 mg by mouth daily at 6 (six) AM. Over the counter dose     metFORMIN (GLUCOPHAGE) 500 MG tablet Take 500 mg by mouth at bedtime.     omeprazole (PRILOSEC) 20 MG capsule TAKE 1 CAPSULE DAILY AS NEEDED (Patient taking differently: Take 20 mg by mouth daily.) 90 capsule 1   promethazine-dextromethorphan (PROMETHAZINE-DM) 6.25-15 MG/5ML syrup Take 5 mLs by mouth 4 (four) times daily as  needed for cough. 140 mL 0   tiotropium (SPIRIVA HANDIHALER) 18 MCG inhalation capsule Place 1 capsule (18 mcg total) into inhaler and inhale daily. 30 capsule 0   zinc gluconate 50 MG tablet Take 50 mg by mouth daily.     No current facility-administered medications for this visit.    SURGICAL HISTORY:  Past Surgical History:  Procedure Laterality Date   CIRCUMCISION     COLONOSCOPY  03/04/2004   YNW:[id number] diverticula.  The remainder of the colonic mucosa appeared normal/normal rectum   COLONOSCOPY  05/22/2009   MVH:QIONGEX mucosa appeared normal/Left-sided diverticula/Normal rectum   COLONOSCOPY N/A 08/02/2014   Procedure: COLONOSCOPY;  Surgeon: Corbin Ade, MD;  Location: [facility];  Service: Endoscopy;  Laterality: N/A;  2:00 PM   COLONOSCOPY WITH PROPOFOL N/A 06/12/2021   Procedure: COLONOSCOPY WITH PROPOFOL;  Surgeon: Corbin Ade, MD;  Location: [facility];  Service: Endoscopy;  Laterality: N/A;  11:00am   KNEE ARTHROSCOPY Right 11/09/2002   LYMPH NODE DISSECTION N/A 04/08/2016   Procedure: LYMPH NODE DISSECTION;  Surgeon: Loreli Slot, MD;  Location: [facility];  Service: Thoracic;  Laterality: N/A;   SEPTOPLASTY     SPINE SURGERY     VIDEO ASSISTED THORACOSCOPY (VATS)/WEDGE RESECTION Left 04/08/2016   Procedure: VIDEO ASSISTED THORACOSCOPY (VATS)/WEDGE RESECTION;  Surgeon: Loreli Slot, MD;  Location: [facility];  Service: Thoracic;  Laterality: Left;   VIDEO BRONCHOSCOPY WITH ENDOBRONCHIAL NAVIGATION N/A 02/26/2016   Procedure: VIDEO BRONCHOSCOPY WITH ENDOBRONCHIAL NAVIGATION LEFT UPPER LOBE LUNG NODULE;  Surgeon: Leslye Peer, MD;  Location: [facility];  Service: Thoracic;  Laterality: N/A;    REVIEW OF SYSTEMS:  A comprehensive review of systems was negative except for: Respiratory: positive for dyspnea on exertion   PHYSICAL EXAMINATION: General appearance: alert, cooperative, and no distress Head: Normocephalic, without obvious abnormality,  atraumatic Neck: no adenopathy, no JVD, supple, symmetrical, trachea midline, and thyroid not enlarged, symmetric, no tenderness/mass/nodules Lymph nodes: Cervical, supraclavicular, and axillary nodes normal. Resp: clear to auscultation bilaterally Back: symmetric, no curvature. ROM normal. No CVA tenderness. Cardio: regular rate and rhythm, S1, S2 normal, no murmur, click, rub or gallop GI: soft, non-tender; bowel sounds normal; no masses,  no organomegaly Extremities: extremities normal, atraumatic, no cyanosis or edema  ECOG PERFORMANCE STATUS: 1 - Symptomatic but completely ambulatory  Blood pressure 122/62, pulse 89, temperature 97.9 F (36.6 C), temperature source Oral, resp. rate 18, height 5\' 6"  (1.676 m), weight 196 lb 9.6 oz (89.2 kg), SpO2 99%.  LABORATORY DATA: Lab Results  Component Value Date   WBC 6.6 05/31/2023   HGB 13.5 05/31/2023   HCT 41.5 05/31/2023  MCV 90.6 05/31/2023   PLT 305 05/31/2023      Chemistry      Component Value Date/Time   NA 141 05/31/2023 0802   NA 141 04/22/2022 0951   NA 140 05/18/2017 1056   K 4.4 05/31/2023 0802   K 4.0 05/18/2017 1056   CL 107 05/31/2023 0802   CO2 25 05/31/2023 0802   CO2 27 05/18/2017 1056   BUN 14 05/31/2023 0802   BUN 16 04/22/2022 0951   BUN 16.8 05/18/2017 1056   CREATININE 0.95 05/31/2023 0802   CREATININE 1.1 05/18/2017 1056      Component Value Date/Time   CALCIUM 9.4 05/31/2023 0802   CALCIUM 10.3 05/18/2017 1056   ALKPHOS 99 05/31/2023 0802   ALKPHOS 92 05/18/2017 1056   AST 18 05/31/2023 0802   AST 20 05/18/2017 1056   ALT 15 05/31/2023 0802   ALT 20 05/18/2017 1056   BILITOT 0.7 05/31/2023 0802   BILITOT 0.72 05/18/2017 1056       RADIOGRAPHIC STUDIES: CT Chest W Contrast  Result Date: 05/31/2023 CLINICAL DATA:  Non-small cell lung cancer; * Tracking Code: BO * EXAM: CT CHEST WITH CONTRAST TECHNIQUE: Multidetector CT imaging of the chest was performed during intravenous contrast  administration. RADIATION DOSE REDUCTION: This exam was performed according to the departmental dose-optimization program which includes automated exposure control, adjustment of the mA and/or kV according to patient size and/or use of iterative reconstruction technique. CONTRAST:  75mL OMNIPAQUE IOHEXOL 300 MG/ML  SOLN COMPARISON:  Multiple priors, most recent chest CT dated June 02, 2022 FINDINGS: Cardiovascular: Normal heart size. No pericardial effusion. Normal caliber thoracic aorta with moderate atherosclerotic disease. Severe coronary artery calcifications. Mediastinum/Nodes: Esophagus and thyroid are unremarkable. No enlarged lymph nodes seen in the chest. Lungs/Pleura: Central airways are patent. Moderate centrilobular emphysema. Stable postsurgical findings of left upper lobe wedge resection. Stable right upper lobe ground-glass nodule measuring 10 mm on series 6, image 12. No new or enlarging pulmonary nodules. No pleural effusion. Upper Abdomen: No acute abnormality. Musculoskeletal: No chest wall abnormality. No acute or significant osseous findings. IMPRESSION: 1. Stable postsurgical findings of left upper lobe wedge resection. No evidence of recurrent or metastatic disease in the chest. 2. Stable right upper lobe ground-glass nodule measuring 10 mm. 3. Aortic Atherosclerosis (ICD10-I70.0) and Emphysema (ICD10-J43.9). Electronically Signed   By: Allegra Lai M.D.   On: 05/31/2023 10:59     ASSESSMENT AND PLAN: This is a very pleasant 77 years old white male with a stage IA non-small cell lung cancer status post left upper lobe lingular segmentectomy in May 2017. The patient has been in observation since that time and he is feeling fine with no concerning complaints except for the baseline shortness of breath. He had repeat CT scan of the chest performed recently.  I personally and independently reviewed the scan and discussed the result with the patient today. His scan showed no concerning  findings for disease recurrence or metastasis and he continues to have the stable right upper lobe groundglass nodule. I recommended for him to continue on observation with repeat CT scan of the chest in 1 year. The patient was advised to call immediately if he has any other concerning symptoms in the interval. The patient voices understanding of current disease status and treatment options and is in agreement with the current care plan. All questions were answered. The patient knows to call the clinic with any problems, questions or concerns. We can certainly see the patient  much sooner if necessary.  Disclaimer: This note was dictated with voice recognition software. Similar sounding words can inadvertently be transcribed and may not be corrected upon review.

## 2023-06-04 DIAGNOSIS — J309 Allergic rhinitis, unspecified: Secondary | ICD-10-CM | POA: Diagnosis not present

## 2023-06-04 DIAGNOSIS — J329 Chronic sinusitis, unspecified: Secondary | ICD-10-CM | POA: Diagnosis not present

## 2023-06-04 DIAGNOSIS — Z9889 Other specified postprocedural states: Secondary | ICD-10-CM | POA: Diagnosis not present

## 2023-06-04 DIAGNOSIS — R0982 Postnasal drip: Secondary | ICD-10-CM | POA: Diagnosis not present

## 2023-07-09 ENCOUNTER — Ambulatory Visit: Payer: Medicare Other | Admitting: Allergy & Immunology

## 2023-08-03 ENCOUNTER — Ambulatory Visit (INDEPENDENT_AMBULATORY_CARE_PROVIDER_SITE_OTHER): Payer: No Typology Code available for payment source | Admitting: Allergy & Immunology

## 2023-08-03 ENCOUNTER — Encounter: Payer: Self-pay | Admitting: Allergy & Immunology

## 2023-08-03 ENCOUNTER — Other Ambulatory Visit: Payer: Self-pay

## 2023-08-03 VITALS — BP 120/78 | HR 89 | Temp 98.6°F | Ht 66.0 in | Wt 186.0 lb

## 2023-08-03 DIAGNOSIS — J4489 Other specified chronic obstructive pulmonary disease: Secondary | ICD-10-CM | POA: Diagnosis not present

## 2023-08-03 DIAGNOSIS — J310 Chronic rhinitis: Secondary | ICD-10-CM | POA: Diagnosis not present

## 2023-08-03 MED ORDER — AZELASTINE HCL 0.1 % NA SOLN: 2.0000 | Freq: Two times a day (BID) | NASAL | 3 refills | Status: DC

## 2023-08-03 NOTE — Patient Instructions (Addendum)
1. Non-allergic rhinitis - Testing today showed: NEGATIVE TO THE ENTIRE PANEL - Copy of test results provided.  - Stop taking: your two current nose sprays  - Start taking: Astelin (azelastine) 2 sprays per nostril twice daily (AIM FOR THE EARS ON EACH SIDE).  - You can use an extra dose of the antihistamine, if needed, for breakthrough symptoms.  - Consider nasal saline rinses 1-2 times daily to remove allergens from the nasal cavities as well as help with mucous clearance (this is especially helpful to do before the nasal sprays are given)  2. 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 - Continue with Advair 250/50 1 puff twice daily. - Continue with albuterol as needed. - We are not can make any medication changes.  3. Return in about 6 weeks (around 09/14/2023). You can have the follow up appointment with Dr. Dellis Anes or a Nurse Practicioner (our Nurse Practitioners are excellent and always have Physician oversight!). You can come to [facility] OR [place]!    Please inform us of any Emergency Department visits, hospitalizations, or changes in symptoms. Call us before going to the ED for breathing or allergy symptoms since we might be able to fit you in for a sick visit. Feel free to contact us anytime with any questions, problems, or concerns.  It was a pleasure to meet you today!  Websites that have reliable patient information: 1. American Academy of Asthma, Allergy, and Immunology: www.aaaai.org 2. Food Allergy Research and Education (FARE): foodallergy.org 3. Mothers of Asthmatics: http://www.asthmacommunitynetwork.org 4. American College of Allergy, Asthma, and Immunology: www.acaai.org   COVID-19 Vaccine Information can be found at: [email or web address] For questions related to vaccine distribution or appointments, please email [email or web address] or call [phone].     "Like" [place] on Facebook  and Instagram for our latest updates!      A healthy democracy works best when [employer] participate! Make sure you are registered to vote! If you have moved or changed any of your contact information, you will need to get this updated before voting! Scan the QR codes below to learn more!        Airborne Adult Perc - 08/03/23 1028     Time Antigen Placed 1028    Allergen Manufacturer Waynette Buttery    Location Back    Number of Test 55    1. Control-Buffer 50% Glycerol Negative    2. Control-Histamine 2+    3. Bahia Negative    4. French Southern Territories Negative    5. Johnson Negative    6. Kentucky Blue Negative    7. Meadow Fescue Negative    8. Perennial Rye Negative    9. Timothy Negative    10. Ragweed Mix Negative    11. Cocklebur Negative    12. Plantain,  English Negative    13. Baccharis Negative    14. Dog Fennel Negative    15. Russian Thistle Negative    16. Lamb's Quarters Negative    17. Sheep Sorrell Negative    18. Rough Pigweed Negative    19. Marsh Elder, Rough Negative    20. Mugwort, Common Negative    21. Box, Elder Negative    22. Cedar, red Negative    23. Sweet Gum Negative    24. Pecan Pollen Negative    25. Pine Mix Negative    26. Walnut, Black Pollen Negative    27. Red Mulberry Negative    28. Ash Mix Negative    29. Birch Mix Negative  30. Beech American Negative    31. Cottonwood, Guinea-Bissau Negative    32. Hickory, White Negative    33. Maple Mix Negative    34. Oak, [place]iss[place] Mix Negative    35. Sycamore Eastern Negative    36. Alternaria Alternata Negative    37. Cladosporium Herbarum Negative    38. Aspergillus Mix Negative    39. Penicillium Mix Negative    40. Bipolaris Sorokiniana (Helminthosporium) Negative    41. Drechslera Spicifera (Curvularia) Negative    42. Mucor Plumbeus Negative    43. Fusarium Moniliforme Negative    44. Aureobasidium Pullulans (pullulara) Negative    45. Rhizopus Oryzae Negative    46. Botrytis Cinera Negative     47. Epicoccum Nigrum Negative    48. Phoma Betae Negative    49. Dust Mite Mix Negative    50. Cat Hair 10,000 BAU/ml Negative    51.  Dog Epithelia Negative    52. Mixed Feathers Negative    53. Horse Epithelia Negative    54. Cockroach, German Negative    55. Tobacco Leaf Negative             Intradermal - 08/03/23 1114     Time Antigen Placed --    Allergen Manufacturer --    Location --             Rhinitis (Hayfever) Overview  There are two types of rhinitis: allergic and non-allergic.  Allergic Rhinitis If you have allergic rhinitis, your immune system mistakenly identifies a typically harmless substance as an intruder. This substance is called an allergen. The immune system responds to the allergen by releasing histamine and chemical mediators that typically cause symptoms in the nose, throat, eyes, ears, skin and roof of the mouth.  Seasonal allergic rhinitis (hay fever) is most often caused by pollen carried in the air during different times of the year in different parts of the country.  Allergic rhinitis can also be triggered by common indoor allergens such as the dried skin flakes, urine and saliva found on pet dander, mold, droppings from dust mites and cockroach particles. This is called perennial allergic rhinitis, as symptoms typically occur year-round.  In addition to allergen triggers, symptoms may also occur from irritants such as smoke and strong odors, or to changes in the temperature and humidity of the air. This happens because allergic rhinitis causes inflammation in the nasal lining, which increases sensitivity to inhalants.  Many people with allergic rhinitis are prone to allergic conjunctivitis (eye allergy). In addition, allergic rhinitis can make symptoms of asthma worse for people who suffer from both conditions.  Nonallergic Rhinitis At least one out of three people with rhinitis symptoms do not have allergies. Nonallergic rhinitis usually  afflicts adults and causes year-round symptoms, especially runny nose and nasal congestion. This condition differs from allergic rhinitis because the immune system is not involved.'
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
## 2023-08-05 ENCOUNTER — Encounter: Payer: Self-pay | Admitting: Allergy & Immunology

## 2023-08-30 DIAGNOSIS — H25812 Combined forms of age-related cataract, left eye: Secondary | ICD-10-CM | POA: Diagnosis not present

## 2023-08-30 DIAGNOSIS — H52202 Unspecified astigmatism, left eye: Secondary | ICD-10-CM | POA: Diagnosis not present

## 2023-09-15 ENCOUNTER — Encounter: Payer: Self-pay | Admitting: Allergy & Immunology

## 2023-09-15 ENCOUNTER — Ambulatory Visit (INDEPENDENT_AMBULATORY_CARE_PROVIDER_SITE_OTHER): Payer: No Typology Code available for payment source | Admitting: Allergy & Immunology

## 2023-09-15 ENCOUNTER — Telehealth: Payer: Self-pay

## 2023-09-15 VITALS — BP 118/72 | HR 92 | Temp 98.3°F | Resp 16

## 2023-09-15 DIAGNOSIS — J310 Chronic rhinitis: Secondary | ICD-10-CM

## 2023-09-15 DIAGNOSIS — J4489 Other specified chronic obstructive pulmonary disease: Secondary | ICD-10-CM

## 2023-09-15 MED ORDER — AZELASTINE HCL 0.1 % NA SOLN: 2.0000 | Freq: Two times a day (BID) | NASAL | 11 refills | Status: AC

## 2023-09-15 MED ORDER — CARBINOXAMINE MALEATE 4 MG PO TABS: 4.0000 mg | ORAL_TABLET | Freq: Two times a day (BID) | ORAL | 5 refills | Status: AC | PRN
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
## 2023-09-15 NOTE — Telephone Encounter (Addendum)
[facility] called and expressed that carbinoxamine was not on their formulary but chlorpheniramine 4mg  immediate rese table is covered. Please advise if it is okay to switch.   Katie: [phone] ex: [phone] if you further questions

## 2023-09-15 NOTE — Patient Instructions (Addendum)
1. Non-allergic rhinitis - Previous testing showed: NEGATIVE TO THE ENTIRE PANEL - Continue taking: Astelin (azelastine) 2 sprays per nostril twice daily (AIM FOR THE EARS ON EACH SIDE).  - Start taking: carbinoxamine 4mg  twice daily as needed.  - You can use an extra dose of the antihistamine, if needed, for breakthrough symptoms.  - Consider nasal saline rinses 1-2 times daily to remove allergens from the nasal cavities as well as help with mucous clearance (this is especially helpful to do before the nasal sprays are given)  2. 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 - Continue with Advair 250/50 1 puff twice daily. - Continue with albuterol as needed. - Try stopping the twice daily dosing of the albuteorl to see how you do without that on board.  3. Return in about 6 months (around 03/14/2024).    Please inform us of any Emergency Department visits, hospitalizations, or changes in symptoms. Call us before going to the ED for breathing or allergy symptoms since we might be able to fit you in for a sick visit. Feel free to contact us anytime with any questions, problems, or concerns.  It was a pleasure to see you again today!  Websites that have reliable patient information: 1. American Academy of Asthma, Allergy, and Immunology: www.aaaai.org 2. Food Allergy Research and Education (FARE): foodallergy.org 3. Mothers of Asthmatics: http://www.asthmacommunitynetwork.org 4. American College of Allergy, Asthma, and Immunology: www.acaai.org      "Like" [place] on Facebook and Instagram for our latest updates!      A healthy democracy works best when [employer] participate! Make sure you are registered to vote! If you have moved or changed any of your contact information, you will need to get this updated before voting! Scan the QR codes below to learn more!

## 2023-09-15 NOTE — Telephone Encounter (Signed)
Yes that is fine.  Thank you.

## 2023-09-16 MED ORDER — CHLORPHENIRAMINE MALEATE 4 MG PO TABS: 4.0000 mg | ORAL_TABLET | Freq: Two times a day (BID) | ORAL | 2 refills | Status: AC | PRN

## 2023-09-16 NOTE — Telephone Encounter (Signed)
Chlorpheniramine (Chlor - Trimeton) 4 mg 1 tablet by mouth 2 times daily as needed for allergies prescription sent to [facility].

## 2023-09-16 NOTE — Addendum Note (Signed)
Addended by: Areta Haber B on: 09/16/2023 10:30 AM   Modules accepted: Orders

## 2023-09-16 NOTE — Telephone Encounter (Signed)
Please advise on instructions for chlorpheniramine.

## 2023-09-27 DIAGNOSIS — H25811 Combined forms of age-related cataract, right eye: Secondary | ICD-10-CM | POA: Diagnosis not present

## 2023-09-27 DIAGNOSIS — H52201 Unspecified astigmatism, right eye: Secondary | ICD-10-CM | POA: Diagnosis not present

## 2023-11-04 DIAGNOSIS — R059 Cough, unspecified: Secondary | ICD-10-CM | POA: Diagnosis not present

## 2023-11-04 DIAGNOSIS — J22 Unspecified acute lower respiratory infection: Secondary | ICD-10-CM | POA: Diagnosis not present

## 2023-11-16 DIAGNOSIS — M25552 Pain in left hip: Secondary | ICD-10-CM | POA: Diagnosis not present

## 2023-11-16 DIAGNOSIS — G8929 Other chronic pain: Secondary | ICD-10-CM | POA: Diagnosis not present

## 2024-01-20 ENCOUNTER — Other Ambulatory Visit: Payer: Self-pay

## 2024-01-20 ENCOUNTER — Ambulatory Visit
Admission: EM | Admit: 2024-01-20 | Discharge: 2024-01-20 | Disposition: A | Discharge status: Home / Self Care | Attending: Nurse Practitioner | Admitting: Nurse Practitioner

## 2024-01-20 ENCOUNTER — Encounter: Payer: Self-pay | Admitting: Emergency Medicine

## 2024-01-20 DIAGNOSIS — J0110 Acute frontal sinusitis, unspecified: Secondary | ICD-10-CM | POA: Diagnosis not present

## 2024-01-20 DIAGNOSIS — Z8709 Personal history of other diseases of the respiratory system: Secondary | ICD-10-CM | POA: Diagnosis not present

## 2024-01-20 LAB — POC COVID19/FLU A&B COMBO
Covid Antigen, POC: NEGATIVE
Influenza A Antigen, POC: NEGATIVE
Influenza B Antigen, POC: NEGATIVE

## 2024-01-20 MED ORDER — AMOXICILLIN-POT CLAVULANATE 875-125 MG PO TABS: 1.0000 | ORAL_TABLET | Freq: Two times a day (BID) | ORAL | 0 refills | Status: AC

## 2024-01-20 NOTE — ED Triage Notes (Signed)
 Pt reports cough, nasal congestion,chill  x4-5 days. Denies any known fevers.

## 2024-01-20 NOTE — Discharge Instructions (Addendum)
 COVID/flu test was negative. Take medication as directed. Continue your current allergy medication regimen. Increase fluids and get plenty of rest. May take over-the-counter Tylenol as needed for pain, fever, or general discomfort. Recommend normal saline nasal spray to help with nasal congestion throughout the day. For your cough, it may be helpful to use a humidifier at bedtime during sleep. If symptoms fail to improve, please follow-up with your primary care physician for further evaluation. Follow-up as needed.
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
## 2024-01-28 ENCOUNTER — Ambulatory Visit: Payer: No Typology Code available for payment source

## 2024-01-28 VITALS — BP 144/84 | Ht 66.0 in | Wt 182.0 lb

## 2024-01-28 DIAGNOSIS — Z5320 Procedure and treatment not carried out because of patient's decision for unspecified reasons: Secondary | ICD-10-CM

## 2024-01-28 DIAGNOSIS — Z0000 Encounter for general adult medical examination without abnormal findings: Secondary | ICD-10-CM

## 2024-01-28 DIAGNOSIS — Z2821 Immunization not carried out because of patient refusal: Secondary | ICD-10-CM

## 2024-01-28 NOTE — Progress Notes (Addendum)
 Because this visit was a virtual/telehealth visit,  certain criteria was not obtained, such a blood pressure, CBG if applicable, and timed get up and go. Any medications not marked as "taking" were not mentioned during the medication reconciliation part of the visit. Any vitals not documented were not able to be obtained due to this being a telehealth visit or patient was unable to self-report a recent blood pressure reading due to a lack of equipment at home via telehealth. Vitals that have been documented are verbally provided by the patient.   Subjective:   Miguel Morales is a 78 y.o. who presents for a Medicare Wellness preventive visit.  Visit Complete: Virtual I connected with  [name] on 01/28/24 by a audio enabled telemedicine application and verified that I am speaking with the correct person using two identifiers.  Patient Location: Home  Provider Location: Home Office  I discussed the limitations of evaluation and management by telemedicine. The patient expressed understanding and agreed to proceed.  Vital Signs: Because this visit was a virtual/telehealth visit, some criteria may be missing or patient reported. Any vitals not documented were not able to be obtained and vitals that have been documented are patient reported.  VideoDeclined- This patient declined Librarian, academic. Therefore the visit was completed with audio only.  Persons Participating in Visit: Patient.  AWV Questionnaire: No: Patient Medicare AWV questionnaire was not completed prior to this visit.  Cardiac Risk Factors include: advanced age (>46men, >16 women);diabetes mellitus;sedentary lifestyle;hypertension;male gender;dyslipidemia     Objective:    Today's Vitals   01/28/24 0854 01/28/24 0855  BP: (!) 144/84   Weight: 182 lb (82.6 kg)   Height: 5\' 6"  (1.676 m)   PainSc:  0-No pain   Body mass index is 29.38 kg/m.     01/28/2024    9:04 AM 01/22/2023   12:14  PM 01/13/2022    9:15 AM 06/12/2021    9:37 AM 06/10/2021    9:31 AM 06/04/2021   10:39 AM 06/05/2020    8:56 AM  Advanced Directives  Does Patient Have a Medical Advance Directive? No No No No No No Yes  Type of Advance Directive       Living will;Healthcare Power of Attorney  Does patient want to make changes to medical advance directive?     No - Patient declined No - Patient declined   Would patient like information on creating a medical advance directive? No - Patient declined No - Patient declined No - Patient declined No - Patient declined No - Patient declined No - Patient declined No - Patient declined    Current Medications (verified) Outpatient Encounter Medications as of 01/28/2024  Medication Sig   acetaminophen (TYLENOL) 500 MG tablet Take 500-1,000 mg by mouth daily.   albuterol (VENTOLIN HFA) 108 (90 Base) MCG/ACT inhaler Inhale 2 puffs into the lungs every 6 (six) hours as needed for wheezing or shortness of breath.   amLODipine (NORVASC) 10 MG tablet TAKE 1 TABLET DAILY (Patient taking differently: Take 10 mg by mouth daily.)   amoxicillin-clavulanate (AUGMENTIN) 875-125 MG tablet Take 1 tablet by mouth every 12 (twelve) hours.   Ascorbic Acid (VITAMIN C) 1000 MG tablet Take 1,000 mg by mouth daily.   aspirin 81 MG tablet Take 81 mg by mouth daily.   atorvastatin (LIPITOR) 80 MG tablet Take 1 tablet (80 mg total) by mouth daily.   azelastine (ASTELIN) 0.1 % nasal spray Place 2 sprays into both nostrils  2 (two) times daily. Use in each nostril as directed   Carbinoxamine Maleate 4 MG TABS Take 1 tablet (4 mg total) by mouth 2 (two) times daily as needed.   Carboxymethylcellulose Sodium (THERATEARS) 0.25 % SOLN Place 1 drop into both eyes daily as needed (dry eyes).   chlorpheniramine (CHLOR-TRIMETON) 4 MG tablet Take 1 tablet (4 mg total) by mouth 2 (two) times daily as needed for allergies.   Cholecalciferol (VITAMIN D) 50 MCG (2000 UT) tablet Take 2,000 Units by mouth daily.    clobetasol (TEMOVATE) 0.05 % external solution Apply 1 application topically every other day.   diclofenac Sodium (VOLTAREN) 1 % GEL Apply 1 application topically daily as needed for pain.   ezetimibe (ZETIA) 10 MG tablet Take 10 mg by mouth daily.   fluticasone-salmeterol (ADVAIR) 250-50 MCG/ACT AEPB Inhale 1 puff into the lungs in the morning and at bedtime.   gabapentin (NEURONTIN) 100 MG capsule Take 300 mg by mouth at bedtime.   hydrOXYzine (ATARAX/VISTARIL) 10 MG tablet Take 10 mg by mouth at bedtime.    ibuprofen (ADVIL,MOTRIN) 200 MG tablet Take 600 mg by mouth daily.   lidocaine (XYLOCAINE) 5 % ointment Apply 1 application topically daily as needed for mild pain (For Back and hip).   Loratadine (CLARITIN PO) Take by mouth.   metFORMIN (GLUCOPHAGE) 500 MG tablet Take 500 mg by mouth at bedtime.   omeprazole (PRILOSEC) 20 MG capsule TAKE 1 CAPSULE DAILY AS NEEDED (Patient taking differently: Take 20 mg by mouth daily.)   zinc gluconate 50 MG tablet Take 50 mg by mouth daily.   No facility-administered encounter medications on file as of 01/28/2024.    Allergies (verified) Misc. sulfonamide containing compounds, Wound dressing adhesive, and Adhesive [tape]   History: Past Medical History:  Diagnosis Date   Allergy N/a   Aortic atherosclerosis (HCC) 01/02/2020   Seen on CT exam February 2021   Arthritis    Asthma    COPD (chronic obstructive pulmonary disease) (HCC)    GERD (gastroesophageal reflux disease)    History of bronchitis    Hypertension    Mixed hyperlipidemia    Nocturia    NSCL CA 01/2016   skin,lung   Recurrent upper respiratory infection (URI)    Shortness of breath dyspnea    with exertion   Sleep apnea    will sometimes wear a CPAP   Spinal stenosis    Past Surgical History:  Procedure Laterality Date   CIRCUMCISION     COLONOSCOPY  03/04/2004   [id number]:[id number] diverticula.  The remainder of the colonic mucosa appeared normal/normal rectum    COLONOSCOPY  05/22/2009   YNW:[id number] mucosa appeared normal/Left-sided diverticula/Normal rectum   COLONOSCOPY N/A 08/02/2014   Procedure: COLONOSCOPY;  Surgeon: Corbin Ade, MD;  Location: [facility];  Service: Endoscopy;  Laterality: N/A;  2:00 PM   COLONOSCOPY WITH PROPOFOL N/A 06/12/2021   Procedure: COLONOSCOPY WITH PROPOFOL;  Surgeon: Corbin Ade, MD;  Location: [facility];  Service: Endoscopy;  Laterality: N/A;  11:00am   KNEE ARTHROSCOPY Right 11/09/2002   LYMPH NODE DISSECTION N/A 04/08/2016   Procedure: LYMPH NODE DISSECTION;  Surgeon: Loreli Slot, MD;  Location: [facility] OR;  Service: Thoracic;  Laterality: N/A;   SEPTOPLASTY     SPINE SURGERY     VIDEO ASSISTED THORACOSCOPY (VATS)/WEDGE RESECTION Left 04/08/2016   Procedure: VIDEO ASSISTED THORACOSCOPY (VATS)/WEDGE RESECTION;  Surgeon: Loreli Slot, MD;  Location: [facility] OR;  Service: Thoracic;  Laterality:  Left;   VIDEO BRONCHOSCOPY WITH ENDOBRONCHIAL NAVIGATION N/A 02/26/2016   Procedure: VIDEO BRONCHOSCOPY WITH ENDOBRONCHIAL NAVIGATION LEFT UPPER LOBE LUNG NODULE;  Surgeon: Leslye Peer, MD;  Location: [facility] OR;  Service: Thoracic;  Laterality: N/A;   Family History  Problem Relation Age of Onset   Allergic rhinitis Mother    Cancer Mother    Allergic rhinitis Father    Social History   Socioeconomic History   Marital status: Married    Spouse name: [name]   Number of children: 2   Years of education: Not on file   Highest education level: Not on file  Occupational History   Not on file  Tobacco Use   Smoking status: Former    Current packs/day: 0.00    Average packs/day: 1 pack/day for 45.0 years (45.0 ttl pk-yrs)    Types: Cigarettes    Start date: 11/09/1958    Quit date: 11/10/2003    Years since quitting: 20.2    Passive exposure: Past   Smokeless tobacco: Never  Vaping Use   Vaping status: Never Used  Substance and Sexual Activity   Alcohol use: Yes    Alcohol/week: 3.0 standard  drinks of alcohol    Types: 3 Glasses of wine per week    Comment: Occasionally   Drug use: No   Sexual activity: Yes  Other Topics Concern   Not on file  Social History Narrative   Married x 12 years 03/2022.    2 children living, 1 deceased.    3 step children   7 grandchildren   7 great grandchildren   Social Drivers of Corporate investment banker Strain: Low Risk  (01/28/2024)   Overall Financial Resource Strain (CARDIA)    Difficulty of Paying Living Expenses: Not hard at all  Food Insecurity: No Food Insecurity (01/28/2024)   Hunger Vital Sign    Worried About Running Out of Food in the Last Year: Never true    Ran Out of Food in the Last Year: Never true  Transportation Needs: No Transportation Needs (01/28/2024)   PRAPARE - Administrator, Civil Service (Medical): No    Lack of Transportation (Non-Medical): No  Physical Activity: Insufficiently Active (01/28/2024)   Exercise Vital Sign    Days of Exercise per Week: 2 days    Minutes of Exercise per Session: 20 min  Stress: No Stress Concern Present (01/28/2024)   [employer] of Occupational Health - Occupational Stress Questionnaire    Feeling of Stress : Not at all  Social Connections: Socially Integrated (01/28/2024)   Social Connection and Isolation Panel [NHANES]    Frequency of Communication with Friends and Family: More than three times a week    Frequency of Social Gatherings with Friends and Family: Three times a week    Attends Religious Services: More than 4 times per year    Active Member of Clubs or Organizations: Yes    Attends Engineer, structural: More than 4 times per year    Marital Status: Married    Tobacco Counseling Counseling given: Not Answered    Clinical Intake:  Pre-visit preparation completed: Yes  Pain : No/denies pain Pain Score: 0-No pain     BMI - recorded: 29.38 Nutritional Status: BMI 25 -29 Overweight Nutritional Risks: None Diabetes: Yes CBG  done?: No Did pt. bring in CBG monitor from home?: No  Lab Results  Component Value Date   HGBA1C 6.5 (H) 04/22/2022   HGBA1C 7.6 (H)  12/03/2021   HGBA1C 6.8 (H) 02/25/2021     How often do you need to have someone help you when you read instructions, pamphlets, or other written materials from your doctor or pharmacy?: 1 - Never  Interpreter Needed?: No  Information entered by :: Estell Harpin   Activities of Daily Living      01/28/2024    9:02 AM  In your present state of health, do you have any difficulty performing the following activities:  Hearing? 1  Comment wears hearing aids  Vision? 0  Difficulty concentrating or making decisions? 0  Walking or climbing stairs? 0  Dressing or bathing? 0  Doing errands, shopping? 0  Preparing Food and eating ? N  Using the Toilet? N  In the past six months, have you accidently leaked urine? N  Do you have problems with loss of bowel control? N  Managing your Medications? N  Managing your Finances? N    Patient Care Team: Babs Sciara, MD as PCP - General (Family Medicine) Adaline Sill, MD as Referring Physician (Ophthalmology) Clinic, Gerome Apley, MD as Referring Physician (Neurosurgery) Harvie Bridge, NP (Nurse Practitioner)  Indicate any recent Medical Services you may have received from other than [facility] providers in the past year (date may be approximate).     Assessment:   This is a routine wellness examination for [place].  Hearing/Vision screen Hearing Screening - Comments:: Wears hearing aids in both ears Vision Screening - Comments:: Patient wears glasses   Goals Addressed             This Visit's Progress    Exercise 3x per week (30 min per time)   On track    Continue to exercise, eat healthy and spend time with grandkids       Depression Screen     01/28/2024    9:06 AM 01/22/2023   12:13 PM 01/13/2022    9:11 AM 02/27/2021   10:09 AM 10/01/2020   10:41 AM  05/01/2020    9:38 AM 01/10/2018    9:34 AM  PHQ 2/9 Scores  PHQ - 2 Score 1 0 0 0 0 0 0  PHQ- 9 Score 3          Fall Risk     01/28/2024    9:04 AM 01/22/2023    9:34 AM 01/18/2023   10:03 AM 01/13/2022    9:15 AM 01/12/2022   10:03 AM  Fall Risk   Falls in the past year? 0 0 0 1 1  Number falls in past yr: 0 0 0 0 0  Injury with Fall? 0 0 0 0 0  Risk for fall due to : Impaired balance/gait;Impaired mobility No Fall Risks  Impaired balance/gait;History of fall(s)   Follow up Falls evaluation completed Falls prevention discussed;Education provided;Falls evaluation completed  Falls prevention discussed     MEDICARE RISK AT HOME:  Medicare Risk at Home Any stairs in or around the home?: Yes If so, are there any without handrails?: No Home free of loose throw rugs in walkways, pet beds, electrical cords, etc?: Yes Adequate lighting in your home to reduce risk of falls?: Yes Life alert?: No Use of a cane, walker or w/c?: No Grab bars in the bathroom?: No Shower chair or bench in shower?: Yes (shower chair) Elevated toilet seat or a handicapped toilet?: Yes  TIMED UP AND GO:  Was the test performed?  No  Cognitive Function: 6CIT completed  01/28/2024    8:57 AM 01/22/2023   12:15 PM 01/13/2022    9:17 AM  6CIT Screen  What Year? 0 points 0 points 0 points  What month?  0 points 0 points  What time? 0 points 0 points 0 points  Count back from 20 0 points 0 points 0 points  Months in reverse 0 points 0 points 0 points  Repeat phrase 0 points 0 points 2 points  Total Score  0 points 2 points    Immunizations Immunization History  Administered Date(s) Administered   Fluad Quad(high Dose 65+) 10/02/2020   Influenza Split 08/20/2009, 07/10/2018   Influenza, High Dose Seasonal PF 07/21/2016, 07/14/2017, 09/04/2019   Influenza,inj,Quad PF,6+ Mos 07/26/2018   Influenza-Unspecified 08/07/2014, 07/23/2015, 08/23/2021, 08/09/2022   Janssen (J&J) SARS-COV-2 Vaccination  01/21/2020   Pneumococcal Conjugate-13 05/09/2014   Pneumococcal Polysaccharide-23 11/10/2011   Td 02/02/2001, 11/09/2010   Zoster, Live 12/14/2012    Screening Tests Health Maintenance  Topic Date Due   Zoster Vaccines- Shingrix (1 of 2) 04/19/1963   COVID-19 Vaccine (2 - Janssen risk series) 02/18/2020   HEMOGLOBIN A1C  10/22/2022   Diabetic kidney evaluation - Urine ACR  04/23/2023   FOOT EXAM  06/10/2023   OPHTHALMOLOGY EXAM  08/11/2023   INFLUENZA VACCINE  02/07/2024 (Originally 06/10/2023)   Diabetic kidney evaluation - eGFR measurement  05/30/2024   Medicare Annual Wellness (AWV)  01/27/2025   Pneumonia Vaccine 7+ Years old  Completed   Hepatitis C Screening  Completed   HPV VACCINES  Aged Out   DTaP/Tdap/Td  Discontinued   Colonoscopy  Discontinued    Health Maintenance  Health Maintenance Due  Topic Date Due   Zoster Vaccines- Shingrix (1 of 2) 04/19/1963   COVID-19 Vaccine (2 - Janssen risk series) 02/18/2020   HEMOGLOBIN A1C  10/22/2022   Diabetic kidney evaluation - Urine ACR  04/23/2023   FOOT EXAM  06/10/2023   OPHTHALMOLOGY EXAM  08/11/2023   Health Maintenance Items Addressed: patient has been advised of everything needed in the health maintenance. Patient declined.  Additional Screening:  Vision Screening: Recommended annual ophthalmology exams for early detection of glaucoma and other disorders of the eye.  Dental Screening: Recommended annual dental exams for proper oral hygiene  Community Resource Referral / Chronic Care Management: CRR required this visit?  No   CCM required this visit?  No     Plan:     I have personally reviewed and noted the following in the patient's chart:   Medical and social history Use of alcohol, tobacco or illicit drugs  Current medications and supplements including opioid prescriptions. Patient is not currently taking opioid prescriptions. Functional ability and status Nutritional status Physical  activity Advanced directives List of other physicians Hospitalizations, surgeries, and ER visits in previous 12 months Vitals Screenings to include cognitive, depression, and falls Referrals and appointments  In addition, I have reviewed and discussed with patient certain preventive protocols, quality metrics, and best practice recommendations. A written personalized care plan for preventive services as well as general preventive health recommendations were provided to patient.     Rudi Heap, [place]   01/28/2024   After Visit Summary: (MyChart) Due to this being a telephonic visit, the after visit summary with patients personalized plan was offered to patient via MyChart   Notes: Nothing significant to report at this time.

## 2024-01-28 NOTE — Patient Instructions (Signed)
 Miguel Morales , Thank you for taking time to come for your Medicare Wellness Visit. I appreciate your ongoing commitment to your health goals. Please review the following plan we discussed and let me know if I can assist you in the future.   Referrals/Orders/Follow-Ups/Clinician Recommendations: Remember to follow up with PCP to get Vaccines and Exams completed  This is a list of the screening recommended for you and due dates:  Health Maintenance  Topic Date Due   Zoster (Shingles) Vaccine (1 of 2) 04/19/1963   COVID-19 Vaccine (2 - Janssen risk series) 02/18/2020   Hemoglobin A1C  10/22/2022   Yearly kidney health urinalysis for diabetes  04/23/2023   Complete foot exam   06/10/2023   Eye exam for diabetics  08/11/2023   Flu Shot  02/07/2024*   Yearly kidney function blood test for diabetes  05/30/2024   Medicare Annual Wellness Visit  01/27/2025   Pneumonia Vaccine  Completed   Hepatitis C Screening  Completed   HPV Vaccine  Aged Out   DTaP/Tdap/Td vaccine  Discontinued   Colon Cancer Screening  Discontinued  *Topic was postponed. The date shown is not the original due date.    Advanced directives: (Declined) Advance directive discussed with you today. Even though you declined this today, please call our office should you change your mind, and we can give you the proper paperwork for you to fill out.  Next Medicare Annual Wellness Visit scheduled for next year: Yes

## 2024-03-15 ENCOUNTER — Ambulatory Visit: Payer: No Typology Code available for payment source | Admitting: Allergy & Immunology

## 2024-03-17 DIAGNOSIS — H353231 Exudative age-related macular degeneration, bilateral, with active choroidal neovascularization: Secondary | ICD-10-CM | POA: Diagnosis not present

## 2024-03-17 DIAGNOSIS — Z961 Presence of intraocular lens: Secondary | ICD-10-CM | POA: Diagnosis not present

## 2024-03-17 DIAGNOSIS — D3131 Benign neoplasm of right choroid: Secondary | ICD-10-CM | POA: Diagnosis not present

## 2024-05-24 ENCOUNTER — Ambulatory Visit (HOSPITAL_COMMUNITY)
Admission: RE | Admit: 2024-05-24 | Discharge: 2024-05-24 | Disposition: A | Discharge status: Home / Self Care | Source: Ambulatory Visit | Attending: Internal Medicine | Admitting: Internal Medicine

## 2024-05-24 ENCOUNTER — Telehealth: Payer: Self-pay

## 2024-05-24 ENCOUNTER — Inpatient Hospital Stay: Payer: No Typology Code available for payment source | Attending: Internal Medicine

## 2024-05-24 DIAGNOSIS — I10 Essential (primary) hypertension: Secondary | ICD-10-CM | POA: Insufficient documentation

## 2024-05-24 DIAGNOSIS — Z85118 Personal history of other malignant neoplasm of bronchus and lung: Secondary | ICD-10-CM | POA: Insufficient documentation

## 2024-05-24 DIAGNOSIS — C3490 Malignant neoplasm of unspecified part of unspecified bronchus or lung: Secondary | ICD-10-CM | POA: Insufficient documentation

## 2024-05-24 DIAGNOSIS — J929 Pleural plaque without asbestos: Secondary | ICD-10-CM | POA: Diagnosis not present

## 2024-05-24 DIAGNOSIS — J439 Emphysema, unspecified: Secondary | ICD-10-CM | POA: Diagnosis not present

## 2024-05-24 DIAGNOSIS — I700 Atherosclerosis of aorta: Secondary | ICD-10-CM | POA: Diagnosis not present

## 2024-05-24 MED ORDER — IOHEXOL 300 MG/ML  SOLN
75.0000 mL | Freq: Once | INTRAMUSCULAR | Status: AC | PRN
  Administered 2024-05-24: 75 mL via INTRAVENOUS

## 2024-05-24 NOTE — Telephone Encounter (Signed)
 Pt came in for a  Travel Letter for the [place] stating that he was here at his appointment on this day 05/24/2024.No questions or concerns to be noted at this time.

## 2024-05-31 ENCOUNTER — Inpatient Hospital Stay: Payer: No Typology Code available for payment source | Admitting: Internal Medicine

## 2024-05-31 VITALS — BP 131/83 | HR 89 | Temp 98.4°F | Resp 17 | Ht 66.0 in | Wt 191.0 lb

## 2024-05-31 DIAGNOSIS — C3490 Malignant neoplasm of unspecified part of unspecified bronchus or lung: Secondary | ICD-10-CM

## 2024-05-31 DIAGNOSIS — Z85118 Personal history of other malignant neoplasm of bronchus and lung: Secondary | ICD-10-CM | POA: Diagnosis not present

## 2024-05-31 NOTE — Progress Notes (Signed)
 [facility] [phone]   [phone]  OFFICE PROGRESS NOTE  Miguel Morales LABOR, MD [address] [place] [address]  DIAGNOSIS: Stage IA (T1a, N0, M0) non-small cell lung cancer, adenocarcinoma with negative EGFR, ALK, ROS 1 and BRAF mutations diagnosed in May 2017. PDL 1 expression is 10%  PRIOR THERAPY: Status post left upper lobe lingular segmentectomy with lymph node dissection under the care of Dr. Kerrin on 04/08/2016.  CURRENT THERAPY: Observation.  INTERVAL HISTORY: Miguel Morales 78 y.o. male returns to the clinic today for annual follow-up visit.Discussed the use of AI scribe software for clinical note transcription with the patient, who gave verbal consent to proceed.  History of Present Illness Miguel Morales is an 78 year old male with stage I non-small cell lung cancer who presents for re-staging with a repeat CT scan of the chest.  He was diagnosed with stage I non-small cell lung cancer, adenocarcinoma, in May 2017. He underwent a left upper lobe lingular segmentectomy with lymph node dissection and has been on observation since that time.  No significant changes since his last visit a year ago, except for sinus issues. He experiences nasal drainage and dry coughing but no hemoptysis. His breathing is good unless he exerts himself excessively. He has gained approximately nine pounds since March, currently weighing 191.4 pounds.  He is not currently taking amoxicillin , which was previously listed in his medications.    MEDICAL HISTORY: Past Medical History:  Diagnosis Date   Allergy  N/a   Aortic atherosclerosis (HCC) 01/02/2020   Seen on CT exam February 2021   Arthritis    Asthma    COPD (chronic obstructive pulmonary disease) (HCC)    GERD (gastroesophageal reflux disease)    History of bronchitis    Hypertension    Mixed hyperlipidemia    Nocturia    NSCL CA 01/2016   skin,lung   Recurrent  upper respiratory infection (URI)    Shortness of breath dyspnea    with exertion   Sleep apnea    will sometimes wear a CPAP   Spinal stenosis     ALLERGIES:  is allergic to misc. sulfonamide containing compounds, wound dressing adhesive, and adhesive [tape].  MEDICATIONS:  Current Outpatient Medications  Medication Sig Dispense Refill   acetaminophen  (TYLENOL ) 500 MG tablet Take 500-1,000 mg by mouth daily.     albuterol  (VENTOLIN  HFA) 108 (90 Base) MCG/ACT inhaler Inhale 2 puffs into the lungs every 6 (six) hours as needed for wheezing or shortness of breath. 8 g 0   amLODipine  (NORVASC ) 10 MG tablet TAKE 1 TABLET DAILY (Patient taking differently: Take 10 mg by mouth daily.) 90 tablet 1   amoxicillin -clavulanate (AUGMENTIN ) 875-125 MG tablet Take 1 tablet by mouth every 12 (twelve) hours. 14 tablet 0   Ascorbic Acid (VITAMIN C) 1000 MG tablet Take 1,000 mg by mouth daily.     aspirin  81 MG tablet Take 81 mg by mouth daily.     atorvastatin  (LIPITOR ) 80 MG tablet Take 1 tablet (80 mg total) by mouth daily. 30 tablet 5   azelastine  (ASTELIN ) 0.1 % nasal spray Place 2 sprays into both nostrils 2 (two) times daily. Use in each nostril as directed 30 mL 11   Carbinoxamine  Maleate 4 MG TABS Take 1 tablet (4 mg total) by mouth 2 (two) times daily as needed. 56 tablet 5   Carboxymethylcellulose Sodium (THERATEARS) 0.25 % SOLN Place 1 drop into  both eyes daily as needed (dry eyes).     chlorpheniramine  (CHLOR-TRIMETON ) 4 MG tablet Take 1 tablet (4 mg total) by mouth 2 (two) times daily as needed for allergies. 60 tablet 2   Cholecalciferol (VITAMIN D) 50 MCG (2000 UT) tablet Take 2,000 Units by mouth daily.     clobetasol  (TEMOVATE ) 0.05 % external solution Apply 1 application topically every other day.     diclofenac Sodium (VOLTAREN) 1 % GEL Apply 1 application topically daily as needed for pain.     ezetimibe  (ZETIA ) 10 MG tablet Take 10 mg by mouth daily.     fluticasone -salmeterol  (ADVAIR) 250-50 MCG/ACT AEPB Inhale 1 puff into the lungs in the morning and at bedtime.     gabapentin  (NEURONTIN ) 100 MG capsule Take 300 mg by mouth at bedtime.     hydrOXYzine  (ATARAX /VISTARIL ) 10 MG tablet Take 10 mg by mouth at bedtime.      ibuprofen (ADVIL,MOTRIN) 200 MG tablet Take 600 mg by mouth daily.     lidocaine  (XYLOCAINE ) 5 % ointment Apply 1 application topically daily as needed for mild pain (For Back and hip).     Loratadine  (CLARITIN  PO) Take by mouth.     metFORMIN (GLUCOPHAGE) 500 MG tablet Take 500 mg by mouth at bedtime.     omeprazole  (PRILOSEC) 20 MG capsule TAKE 1 CAPSULE DAILY AS NEEDED (Patient taking differently: Take 20 mg by mouth daily.) 90 capsule 1   zinc  gluconate 50 MG tablet Take 50 mg by mouth daily.     No current facility-administered medications for this visit.    SURGICAL HISTORY:  Past Surgical History:  Procedure Laterality Date   CIRCUMCISION     COLONOSCOPY  03/04/2004   MFM:Ozqu-dpizi diverticula.  The remainder of the colonic mucosa appeared normal/normal rectum   COLONOSCOPY  05/22/2009   MFM:Rnonwpr mucosa appeared normal/Left-sided diverticula/Normal rectum   COLONOSCOPY N/A 08/02/2014   Procedure: COLONOSCOPY;  Surgeon: Lamar CHRISTELLA Hollingshead, MD;  Location: [facility];  Service: Endoscopy;  Laterality: N/A;  2:00 PM   COLONOSCOPY WITH PROPOFOL  N/A 06/12/2021   Procedure: COLONOSCOPY WITH PROPOFOL ;  Surgeon: Hollingshead Lamar CHRISTELLA, MD;  Location: [facility];  Service: Endoscopy;  Laterality: N/A;  11:00am   KNEE ARTHROSCOPY Right 11/09/2002   LYMPH NODE DISSECTION N/A 04/08/2016   Procedure: LYMPH NODE DISSECTION;  Surgeon: Elspeth JAYSON Millers, MD;  Location: [facility] OR;  Service: Thoracic;  Laterality: N/A;   SEPTOPLASTY     SPINE SURGERY     VIDEO ASSISTED THORACOSCOPY (VATS)/WEDGE RESECTION Left 04/08/2016   Procedure: VIDEO ASSISTED THORACOSCOPY (VATS)/WEDGE RESECTION;  Surgeon: Elspeth JAYSON Millers, MD;  Location: [facility] OR;  Service: Thoracic;   Laterality: Left;   VIDEO BRONCHOSCOPY WITH ENDOBRONCHIAL NAVIGATION N/A 02/26/2016   Procedure: VIDEO BRONCHOSCOPY WITH ENDOBRONCHIAL NAVIGATION LEFT UPPER LOBE LUNG NODULE;  Surgeon: Lamar GORMAN Chris, MD;  Location: [facility] OR;  Service: Thoracic;  Laterality: N/A;    REVIEW OF SYSTEMS:  A comprehensive review of systems was negative except for: Respiratory: positive for dyspnea on exertion   PHYSICAL EXAMINATION: General appearance: alert, cooperative, and no distress Head: Normocephalic, without obvious abnormality, atraumatic Neck: no adenopathy, no JVD, supple, symmetrical, trachea midline, and thyroid  not enlarged, symmetric, no tenderness/mass/nodules Lymph nodes: Cervical, supraclavicular, and axillary nodes normal. Resp: clear to auscultation bilaterally Back: symmetric, no curvature. ROM normal. No CVA tenderness. Cardio: regular rate and rhythm, S1, S2 normal, no murmur, click, rub or gallop GI: soft, non-tender; bowel sounds normal; no masses,  no organomegaly Extremities: extremities normal, atraumatic, no cyanosis or edema  ECOG PERFORMANCE STATUS: 1 - Symptomatic but completely ambulatory  Blood pressure 131/83, pulse 89, temperature 98.4 F (36.9 C), temperature source Temporal, resp. rate 17, height 5' 6 (1.676 m), weight 191 lb (86.6 kg), SpO2 98%.  LABORATORY DATA: Lab Results  Component Value Date   WBC 6.6 05/31/2023   HGB 13.5 05/31/2023   HCT 41.5 05/31/2023   MCV 90.6 05/31/2023   PLT 305 05/31/2023      Chemistry      Component Value Date/Time   NA 141 05/31/2023 0802   NA 141 04/22/2022 0951   NA 140 05/18/2017 1056   K 4.4 05/31/2023 0802   K 4.0 05/18/2017 1056   CL 107 05/31/2023 0802   CO2 25 05/31/2023 0802   CO2 27 05/18/2017 1056   BUN 14 05/31/2023 0802   BUN 16 04/22/2022 0951   BUN 16.8 05/18/2017 1056   CREATININE 0.95 05/31/2023 0802   CREATININE 1.1 05/18/2017 1056      Component Value Date/Time   CALCIUM  9.4 05/31/2023 0802    CALCIUM  10.3 05/18/2017 1056   ALKPHOS 99 05/31/2023 0802   ALKPHOS 92 05/18/2017 1056   AST 18 05/31/2023 0802   AST 20 05/18/2017 1056   ALT 15 05/31/2023 0802   ALT 20 05/18/2017 1056   BILITOT 0.7 05/31/2023 0802   BILITOT 0.72 05/18/2017 1056       RADIOGRAPHIC STUDIES: CT Chest W Contrast Result Date: 05/26/2024 CLINICAL DATA:  Staging non-small-cell lung cancer. * Tracking Code: BO * EXAM: CT CHEST WITH CONTRAST TECHNIQUE: Multidetector CT imaging of the chest was performed during intravenous contrast administration. RADIATION DOSE REDUCTION: This exam was performed according to the departmental dose-optimization program which includes automated exposure control, adjustment of the mA and/or kV according to patient size and/or use of iterative reconstruction technique. CONTRAST:  75mL OMNIPAQUE  IOHEXOL  300 MG/ML  SOLN COMPARISON:  CT 05/31/2023 and older. FINDINGS: Cardiovascular: Thoracic aorta is normal course and caliber with some scattered partially calcified atherosclerotic plaque. Heart is nonenlarged. Trace pericardial fluid or thickening. Scattered vascular calcifications are seen including along the coronary arteries. Mediastinum/Nodes: Mildly atrophic thyroid  gland. Slightly patulous thoracic esophagus. No specific abnormal lymph node enlargement identified in the axillary region, hilum or mediastinum. There is some stable small less than 1 cm in size in short axis mediastinal nodes, not pathologic by size criteria. Site nodes as well along the right infrahilar region. Lungs/Pleura: Once again there are some centrilobular emphysematous lung changes identified particular in the upper lung zones. Few areas of interstitial septal thickening identified as well. Some mild dependent atelectasis or scarring. No new consolidation, pneumothorax or effusion. Stable pleural thickening along the lateral left lower lobe with some bandlike changes. There is also stable surgical changes towards the  lingula with a small nodular area seen along the superior margin of the suture line on image 66 of series 6 in the axial plane this measures 11 by 8 mm, unchanged from previous. This area somewhat tubular on sagittal image 141 of series 6 and unchanged. This very well could be posttreatment related. There are a few scattered tiny nodules identified. Some of these are calcified and related to old granulomatous disease. There is a noncalcified focus in the right lower lobe on series 6, image 24 measuring 3 mm unchanged from previous exam going back to least 2021 demonstrating long-term stability as are some of the other lesions. The area of ground-glass at  the right lung apex is also stable today measuring 10 mm on series 6, image 22. This has been present since July 2021 but slightly larger going back to the remote study. Upper Abdomen: Adrenal glands are grossly preserved in the upper abdomen. Stable small calcification in the right hepatic lobe. Small cystic focus in segment 4 of the liver as well. Upper pole right-sided renal cystic focus is also stable. Musculoskeletal: Diffuse degenerative changes along the spine. Near bridging multilevel endplate osteophytes and a few bridging levels. IMPRESSION: Stable postsurgical changes left upper lobe from previous wedge resection. Associated thickening. Few other small lung nodules are identified. The vast majority are stable. The small ground-glass nodule at the right upper lobe is stable going back to July 2024. Minimally increased compared to the study of July 2021. Simple attention on follow up surveillance in 1 year. Aortic Atherosclerosis (ICD10-I70.0) and Emphysema (ICD10-J43.9). Electronically Signed   By: Ranell Bring M.D.   On: 05/26/2024 18:10     ASSESSMENT AND PLAN: This is a very pleasant 78 years old white male with a stage IA non-small cell lung cancer status post left upper lobe lingular segmentectomy in May 2017. The patient has been in observation  since that time and he is feeling fine with no concerning complaints except for the baseline shortness of breath. The patient had repeat CT scan of the chest performed recently.  I personally independently reviewed the scan and discussed the result with the patient today.  His scan showed stable disease with no concerning findings for progression. Assessment and Plan Assessment & Plan Stage I non-small cell lung cancer, adenocarcinoma Diagnosed in May 2017, status post left upper lobe lingular segmentectomy with lymph node dissection. Currently under observation with no concerning findings on recent CT scan. Tiny nodules in the lungs remain unchanged. - Schedule follow-up CT scan in one year.  Nasal drainage Reports significant nasal drainage with dry coughing. No hemoptysis. Symptoms suggestive of sinus-related issues. He was advised to call immediately if he has any concerning symptoms in the interval.  The patient voices understanding of current disease status and treatment options and is in agreement with the current care plan. All questions were answered. The patient knows to call the clinic with any problems, questions or concerns. We can certainly see the patient much sooner if necessary.  Disclaimer: This note was dictated with voice recognition software. Similar sounding words can inadvertently be transcribed and may not be corrected upon review.

## 2024-08-11 DIAGNOSIS — J069 Acute upper respiratory infection, unspecified: Secondary | ICD-10-CM | POA: Diagnosis not present

## 2024-08-11 DIAGNOSIS — B9689 Other specified bacterial agents as the cause of diseases classified elsewhere: Secondary | ICD-10-CM | POA: Diagnosis not present

## 2025-02-01 ENCOUNTER — Ambulatory Visit

## 2025-02-02 ENCOUNTER — Ambulatory Visit

## 2025-05-22 ENCOUNTER — Other Ambulatory Visit

## 2025-05-29 ENCOUNTER — Ambulatory Visit: Admitting: Internal Medicine
